# Patient Record
Sex: Female | Born: 1975
Health system: Southern US, Community
[De-identification: ages and names within clinical notes are randomized; demographics above are authoritative.]

## PROBLEM LIST (undated history)

## (undated) DIAGNOSIS — F419 Anxiety disorder, unspecified: Secondary | ICD-10-CM

## (undated) DIAGNOSIS — R51 Headache: Secondary | ICD-10-CM

## (undated) DIAGNOSIS — H11159 Pinguecula, unspecified eye: Secondary | ICD-10-CM

## (undated) DIAGNOSIS — E538 Deficiency of other specified B group vitamins: Secondary | ICD-10-CM

## (undated) DIAGNOSIS — E119 Type 2 diabetes mellitus without complications: Secondary | ICD-10-CM

## (undated) DIAGNOSIS — E785 Hyperlipidemia, unspecified: Secondary | ICD-10-CM

## (undated) DIAGNOSIS — G932 Benign intracranial hypertension: Secondary | ICD-10-CM

## (undated) DIAGNOSIS — R7301 Impaired fasting glucose: Secondary | ICD-10-CM

## (undated) DIAGNOSIS — R519 Headache, unspecified: Secondary | ICD-10-CM

## (undated) DIAGNOSIS — M222X9 Patellofemoral disorders, unspecified knee: Secondary | ICD-10-CM

## (undated) DIAGNOSIS — G43909 Migraine, unspecified, not intractable, without status migrainosus: Secondary | ICD-10-CM

## (undated) DIAGNOSIS — F329 Major depressive disorder, single episode, unspecified: Secondary | ICD-10-CM

## (undated) DIAGNOSIS — D239 Other benign neoplasm of skin, unspecified: Secondary | ICD-10-CM

## (undated) DIAGNOSIS — E786 Lipoprotein deficiency: Secondary | ICD-10-CM

## (undated) DIAGNOSIS — E041 Nontoxic single thyroid nodule: Secondary | ICD-10-CM

## (undated) DIAGNOSIS — M549 Dorsalgia, unspecified: Secondary | ICD-10-CM

## (undated) DIAGNOSIS — K5900 Constipation, unspecified: Secondary | ICD-10-CM

## (undated) DIAGNOSIS — E559 Vitamin D deficiency, unspecified: Secondary | ICD-10-CM

## (undated) DIAGNOSIS — E663 Overweight: Secondary | ICD-10-CM

## (undated) DIAGNOSIS — F32A Depression, unspecified: Secondary | ICD-10-CM

## (undated) DIAGNOSIS — T7840XA Allergy, unspecified, initial encounter: Secondary | ICD-10-CM

## (undated) DIAGNOSIS — R7303 Prediabetes: Secondary | ICD-10-CM

## (undated) DIAGNOSIS — I729 Aneurysm of unspecified site: Secondary | ICD-10-CM

## (undated) DIAGNOSIS — E669 Obesity, unspecified: Secondary | ICD-10-CM

## (undated) DIAGNOSIS — K219 Gastro-esophageal reflux disease without esophagitis: Secondary | ICD-10-CM

## (undated) HISTORY — DX: Constipation, unspecified: K59.00

## (undated) HISTORY — DX: Impaired fasting glucose: R73.01

## (undated) HISTORY — DX: Lipoprotein deficiency: E78.6

## (undated) HISTORY — DX: Migraine, unspecified, not intractable, without status migrainosus: G43.909

## (undated) HISTORY — DX: Depression, unspecified: F32.A

## (undated) HISTORY — DX: Headache, unspecified: R51.9

## (undated) HISTORY — PX: GANGLION CYST EXCISION: SHX1691

## (undated) HISTORY — DX: Headache: R51

## (undated) HISTORY — DX: Pinguecula, unspecified eye: H11.159

## (undated) HISTORY — DX: Prediabetes: R73.03

## (undated) HISTORY — DX: Aneurysm of unspecified site: I72.9

## (undated) HISTORY — DX: Hyperlipidemia, unspecified: E78.5

## (undated) HISTORY — PX: CARPAL TUNNEL RELEASE: SHX101

## (undated) HISTORY — DX: Nontoxic single thyroid nodule: E04.1

## (undated) HISTORY — DX: Other benign neoplasm of skin, unspecified: D23.9

## (undated) HISTORY — DX: Allergy, unspecified, initial encounter: T78.40XA

## (undated) HISTORY — DX: Overweight: E66.3

## (undated) HISTORY — PX: TUBAL LIGATION: SHX77

## (undated) HISTORY — DX: Patellofemoral disorders, unspecified knee: M22.2X9

## (undated) HISTORY — DX: Dorsalgia, unspecified: M54.9

## (undated) HISTORY — DX: Deficiency of other specified B group vitamins: E53.8

## (undated) HISTORY — DX: Obesity, unspecified: E66.9

## (undated) HISTORY — DX: Major depressive disorder, single episode, unspecified: F32.9

## (undated) HISTORY — DX: Vitamin D deficiency, unspecified: E55.9

## (undated) HISTORY — DX: Benign intracranial hypertension: G93.2

---

## 2005-08-17 ENCOUNTER — Ambulatory Visit: Payer: Self-pay | Admitting: Family Medicine

## 2006-02-02 ENCOUNTER — Inpatient Hospital Stay: Payer: Self-pay | Admitting: Obstetrics and Gynecology

## 2007-02-21 ENCOUNTER — Encounter: Payer: Self-pay | Admitting: Family Medicine

## 2007-03-21 ENCOUNTER — Encounter: Payer: Self-pay | Admitting: Family Medicine

## 2008-01-16 ENCOUNTER — Ambulatory Visit: Payer: Self-pay | Admitting: Specialist

## 2008-01-23 ENCOUNTER — Ambulatory Visit: Payer: Self-pay | Admitting: Specialist

## 2008-12-04 ENCOUNTER — Ambulatory Visit: Payer: Self-pay

## 2009-05-28 ENCOUNTER — Ambulatory Visit: Payer: Self-pay | Admitting: Otolaryngology

## 2010-10-06 ENCOUNTER — Ambulatory Visit: Payer: Self-pay | Admitting: Family Medicine

## 2010-11-09 ENCOUNTER — Ambulatory Visit: Payer: Self-pay | Admitting: Emergency Medicine

## 2010-11-16 ENCOUNTER — Ambulatory Visit: Payer: Self-pay | Admitting: Gastroenterology

## 2013-03-28 ENCOUNTER — Ambulatory Visit: Payer: Self-pay

## 2013-08-12 LAB — HM PAP SMEAR: HM PAP: NEGATIVE

## 2013-08-15 ENCOUNTER — Ambulatory Visit: Payer: Self-pay | Admitting: Family Medicine

## 2014-07-30 ENCOUNTER — Ambulatory Visit: Payer: Self-pay | Admitting: Family Medicine

## 2014-08-13 ENCOUNTER — Ambulatory Visit: Payer: Self-pay | Admitting: Family Medicine

## 2014-08-20 ENCOUNTER — Ambulatory Visit: Payer: Self-pay | Admitting: Family Medicine

## 2014-09-08 ENCOUNTER — Ambulatory Visit: Payer: Self-pay | Admitting: Otolaryngology

## 2014-09-20 HISTORY — PX: NASAL SINUS SURGERY: SHX719

## 2014-10-30 ENCOUNTER — Ambulatory Visit: Payer: Self-pay | Admitting: Family Medicine

## 2014-11-11 DIAGNOSIS — J324 Chronic pansinusitis: Secondary | ICD-10-CM | POA: Insufficient documentation

## 2015-02-19 DIAGNOSIS — E041 Nontoxic single thyroid nodule: Secondary | ICD-10-CM

## 2015-02-19 HISTORY — DX: Nontoxic single thyroid nodule: E04.1

## 2015-03-17 ENCOUNTER — Ambulatory Visit
Admit: 2015-03-17 | Disposition: A | Discharge status: Home / Self Care | Payer: Self-pay | Attending: Family Medicine | Admitting: Family Medicine

## 2015-04-23 ENCOUNTER — Other Ambulatory Visit: Payer: Self-pay | Admitting: Family Medicine

## 2015-04-30 ENCOUNTER — Other Ambulatory Visit: Payer: Self-pay | Admitting: Family Medicine

## 2015-04-30 NOTE — Telephone Encounter (Signed)
Pt called states she would like a medication to help her lose weight. Pt stated Dr. Sanda Klein told her to call back for medication if she was unable to lose weight within a month. Please call pt @ [phone]. Pharm is [facility]. Thanks.

## 2015-04-30 NOTE — Telephone Encounter (Signed)
I reviewed the last note; we actually were going to have her return for this as an appt

## 2015-05-07 DIAGNOSIS — R51 Headache: Secondary | ICD-10-CM

## 2015-05-07 DIAGNOSIS — R7301 Impaired fasting glucose: Secondary | ICD-10-CM | POA: Insufficient documentation

## 2015-05-07 DIAGNOSIS — E785 Hyperlipidemia, unspecified: Secondary | ICD-10-CM | POA: Insufficient documentation

## 2015-05-07 DIAGNOSIS — E663 Overweight: Secondary | ICD-10-CM | POA: Insufficient documentation

## 2015-05-07 DIAGNOSIS — T7840XA Allergy, unspecified, initial encounter: Secondary | ICD-10-CM | POA: Insufficient documentation

## 2015-05-07 DIAGNOSIS — F329 Major depressive disorder, single episode, unspecified: Secondary | ICD-10-CM | POA: Insufficient documentation

## 2015-05-07 DIAGNOSIS — E559 Vitamin D deficiency, unspecified: Secondary | ICD-10-CM | POA: Insufficient documentation

## 2015-05-07 DIAGNOSIS — E786 Lipoprotein deficiency: Secondary | ICD-10-CM | POA: Insufficient documentation

## 2015-05-07 DIAGNOSIS — M222X9 Patellofemoral disorders, unspecified knee: Secondary | ICD-10-CM | POA: Insufficient documentation

## 2015-05-07 DIAGNOSIS — D239 Other benign neoplasm of skin, unspecified: Secondary | ICD-10-CM | POA: Insufficient documentation

## 2015-05-07 DIAGNOSIS — H11159 Pinguecula, unspecified eye: Secondary | ICD-10-CM | POA: Insufficient documentation

## 2015-05-07 DIAGNOSIS — R519 Headache, unspecified: Secondary | ICD-10-CM | POA: Insufficient documentation

## 2015-05-07 DIAGNOSIS — F32A Depression, unspecified: Secondary | ICD-10-CM | POA: Insufficient documentation

## 2015-05-11 NOTE — Telephone Encounter (Signed)
Routed to provider

## 2015-05-11 NOTE — Telephone Encounter (Signed)
EFAX came through for refill RX: OMEPRAZOLE RX copy in basket

## 2015-05-12 ENCOUNTER — Ambulatory Visit: Payer: Self-pay | Admitting: Family Medicine

## 2015-05-12 NOTE — Telephone Encounter (Signed)
Pt has an appt scheduled for this Friday. Thanks.

## 2015-05-12 NOTE — Telephone Encounter (Signed)
This note is for an appointment, not a refill request; please schedule her for an appointment

## 2015-05-14 ENCOUNTER — Encounter: Payer: Self-pay | Admitting: Family Medicine

## 2015-05-14 ENCOUNTER — Ambulatory Visit (INDEPENDENT_AMBULATORY_CARE_PROVIDER_SITE_OTHER): Payer: 59 | Admitting: Family Medicine

## 2015-05-14 VITALS — BP 121/78 | HR 62 | Temp 99.4°F | Ht 60.0 in | Wt 158.0 lb

## 2015-05-14 DIAGNOSIS — E786 Lipoprotein deficiency: Secondary | ICD-10-CM | POA: Diagnosis not present

## 2015-05-14 DIAGNOSIS — E559 Vitamin D deficiency, unspecified: Secondary | ICD-10-CM

## 2015-05-14 DIAGNOSIS — E669 Obesity, unspecified: Secondary | ICD-10-CM | POA: Diagnosis not present

## 2015-05-14 MED ORDER — TOPIRAMATE 25 MG PO TABS: ORAL_TABLET | ORAL | Status: DC

## 2015-05-14 NOTE — Assessment & Plan Note (Signed)
Check level at f/u appt in 4 weeks; continue supplement

## 2015-05-14 NOTE — Patient Instructions (Addendum)
Return in 4 weeks to check labs (come fasting) Start the new medicine, once a day for one week and then two a day Call with any issues before then

## 2015-05-14 NOTE — Assessment & Plan Note (Signed)
Check lipid panel at f/u in 4 weeks; come fasting

## 2015-05-14 NOTE — Progress Notes (Signed)
BP 121/78 mmHg  Pulse 62  Temp(Src) 99.4 F (37.4 C)  Ht 5' (1.524 m)  Wt 158 lb (71.668 kg)  BMI 30.86 kg/m2  SpO2 99%  LMP  (LMP Unknown)   Subjective:    Patient ID: Ashley Wiley, female    DOB: 1976-05-30, 39 y.o.   MRN: [id number]  HPI: Ashley Wiley is a 39 y.o. female  Chief Complaint  Patient presents with  . Obesity   She tried eating very very healthy; so many veggies; drinking plenty of water, all day long She has cut back on her activity since starting [employer], lots of stress at work Legs have been hurting, not sure why; she can sit if she wants to at work, or stands; nothing different; not sure if carrying too much weight; burning pain in both legs; no swelling; she stopped going to the gym So tired; vit D deficiency in January; taking vitamin D daily, and even added B12 2,000 mcg daily a few weeks ago Sleeping okay, through the night, even naps after work Stress level is manageable Thyroid was okay in January 2016; no known thyroid dz in family She snores but doesn't think she has sleep apnea; had a sleep test a few years ago and was okay No one in family has had MEN Tubes are tied  Relevant past medical, surgical, family and social history reviewed and updated as indicated. Interim medical history since our last visit reviewed. Allergies and medications reviewed and updated.  Review of Systems  Constitutional: Positive for fatigue.  Skin:       No stretch marks, no acne  Hematological: Does not bruise/bleed easily.  Psychiatric/Behavioral: Negative for sleep disturbance.   Per HPI unless specifically indicated above     Objective:    BP 121/78 mmHg  Pulse 62  Temp(Src) 99.4 F (37.4 C)  Ht 5' (1.524 m)  Wt 158 lb (71.668 kg)  BMI 30.86 kg/m2  SpO2 99%  LMP  (LMP Unknown)  Wt Readings from Last 3 Encounters:  05/14/15 158 lb (71.668 kg)  03/23/15 157 lb (71.215 kg)    Physical Exam  Constitutional: She appears well-developed and  well-nourished. No distress.  obese  Cardiovascular: Normal rate.   Pulmonary/Chest: Effort normal.  Musculoskeletal: She exhibits no edema or tenderness.  Skin: Skin is warm and dry. No rash noted. She is not diaphoretic. No pallor.  Psychiatric: She has a normal mood and affect. Her behavior is normal. Judgment and thought content normal.      Assessment & Plan:   Problem List Items Addressed This Visit      Other   Low HDL (under 40)    Check lipid panel at f/u in 4 weeks; come fasting      Vitamin D deficiency disease    Check level at f/u appt in 4 weeks; continue supplement      Obesity - Primary    Talked with patient about treatment options; she has failed conservative weight loss efforts with just diet and activity; BMI is over 30; discussed options for medical weight loss therapy; we opted for topiramate; she is s/p BTL, discussed risk of birth defects if woman gets pregnant on this; return in 4 weeks, check LFTs at that visit too; call if any changes in mood      Relevant Medications   topiramate (TOPAMAX) 25 MG tablet       Follow up plan: Return in about 4 weeks (around 06/11/2015).

## 2015-05-14 NOTE — Assessment & Plan Note (Signed)
Talked with patient about treatment options; she has failed conservative weight loss efforts with just diet and activity; BMI is over 30; discussed options for medical weight loss therapy; we opted for topiramate; she is s/p BTL, discussed risk of birth defects if woman gets pregnant on this; return in 4 weeks, check LFTs at that visit too; call if any changes in mood

## 2015-06-10 ENCOUNTER — Ambulatory Visit: Payer: 59 | Admitting: Family Medicine

## 2015-07-13 ENCOUNTER — Encounter: Payer: Self-pay | Admitting: Family Medicine

## 2015-07-13 ENCOUNTER — Ambulatory Visit (INDEPENDENT_AMBULATORY_CARE_PROVIDER_SITE_OTHER): Payer: 59 | Admitting: Family Medicine

## 2015-07-13 VITALS — BP 121/83 | HR 80 | Temp 98.5°F | Wt 159.0 lb

## 2015-07-13 DIAGNOSIS — E041 Nontoxic single thyroid nodule: Secondary | ICD-10-CM | POA: Diagnosis not present

## 2015-07-13 DIAGNOSIS — J301 Allergic rhinitis due to pollen: Secondary | ICD-10-CM

## 2015-07-13 DIAGNOSIS — E669 Obesity, unspecified: Secondary | ICD-10-CM | POA: Diagnosis not present

## 2015-07-13 DIAGNOSIS — E559 Vitamin D deficiency, unspecified: Secondary | ICD-10-CM

## 2015-07-13 DIAGNOSIS — E786 Lipoprotein deficiency: Secondary | ICD-10-CM | POA: Diagnosis not present

## 2015-07-13 DIAGNOSIS — E785 Hyperlipidemia, unspecified: Secondary | ICD-10-CM

## 2015-07-13 DIAGNOSIS — J309 Allergic rhinitis, unspecified: Secondary | ICD-10-CM | POA: Insufficient documentation

## 2015-07-13 DIAGNOSIS — R7301 Impaired fasting glucose: Secondary | ICD-10-CM | POA: Diagnosis not present

## 2015-07-13 NOTE — Assessment & Plan Note (Signed)
Check TSH; reeviewed the [place] from April, benign-appearing sub-cm nodule on the left, nonpalpable today

## 2015-07-13 NOTE — Assessment & Plan Note (Signed)
Check cholesterol today; positive family hx; limit saturated fats and total fats

## 2015-07-13 NOTE — Patient Instructions (Addendum)
Check out the information at familydoctor.org entitled "What It Takes to Lose Weight" Try to lose between 1-2 pounds per week by taking in fewer calories and burning off more calories You can succeed by limiting portions, limiting foods dense in calories and fat, becoming more active, and drinking 8 glasses of water a day Don't skip meals, especially breakfast, as skipping meals may alter your metabolism Do not use over-the-counter weight loss pills or gimmicks that claim rapid weight loss A healthy BMI (or body mass index) is between 18.5 and 24.9 You can calculate your ideal BMI at the [place] website ClubMonetize.fr Commit to an exercise program If the swallowing/sticking gets worse, then let me know Next thyroid scan in late April 2017

## 2015-07-13 NOTE — Assessment & Plan Note (Signed)
Continue current meds 

## 2015-07-13 NOTE — Assessment & Plan Note (Signed)
Check today, patient is fasting

## 2015-07-13 NOTE — Assessment & Plan Note (Signed)
Check A1c today, work on weight loss, avoid sweets and white rice, white flour, white pastas

## 2015-07-13 NOTE — Progress Notes (Signed)
BP 121/83 mmHg  Pulse 80  Temp(Src) 98.5 F (36.9 C)  Wt 159 lb (72.122 kg)  SpO2 98%  LMP 07/02/2015 (Exact Date)   Subjective:    Patient ID: Ashley Wiley, female    DOB: 09/12/1976, 39 y.o.   MRN: [id number]  HPI: Ashley Wiley is a 39 y.o. female  Chief Complaint  Patient presents with  . Obesity  . OTHER    IFG   High cholesterol, low HDL; chol probs run in family; trying to eat better  Vitamin D supplementation daily, 2,000 iu daily  Obesity; was taking topiramate for weight loss; does not think it has helped at all; no change in appetite; she did not notice any difference and went on vacation and just stopped it; right now, she is just watching what she is eating, smaller portions; she does not have any idea how many calories she is getting a day, and can't use myfitnesspal because homemade foods; since coming back from vacation, she isn't exercising; no excuses; she just hasn't really been trying; she wants to stop at 160 pounds, she can't be over 160 pounds she says; had granola bar, peach, strip of steak and refried beans (olive oil), 3 small tortillas, half of an avocado; she has tried work out videos at home, but can't do it at home right now with in-laws at home in the morning; mother-in-law is cooking, trying to eat small amounts; thinks she is going to have join a gym  Prediabetes; checking labs today; no dry mouth; no blurry vision; getting up at night to urinate twice or so  Relevant past medical, surgical, family and social history reviewed and updated as indicated. Interim medical history since our last visit reviewed. Allergies and medications reviewed and updated.  Review of Systems  Constitutional: Negative for unexpected weight change.  HENT: Positive for trouble swallowing.    Per HPI unless specifically indicated above     Objective:    BP 121/83 mmHg  Pulse 80  Temp(Src) 98.5 F (36.9 C)  Wt 159 lb (72.122 kg)  SpO2 98%  LMP  07/02/2015 (Exact Date)  Wt Readings from Last 3 Encounters:  07/13/15 159 lb (72.122 kg)  05/14/15 158 lb (71.668 kg)  03/23/15 157 lb (71.215 kg)    Physical Exam  Constitutional: She appears well-developed and well-nourished. No distress.  HENT:  Head: Normocephalic and atraumatic.  Eyes: EOM are normal. No scleral icterus.  Neck: No thyromegaly present.  Cardiovascular: Normal rate, regular rhythm and normal heart sounds.   No murmur heard. Pulmonary/Chest: Effort normal and breath sounds normal. No respiratory distress. She has no wheezes.  Abdominal: She exhibits no distension.  Musculoskeletal: Normal range of motion. She exhibits no edema.  Neurological: She is alert. She displays no tremor. She exhibits normal muscle tone.  Reflex Scores:      Patellar reflexes are 1+ on the right side and 1+ on the left side. Skin: Skin is warm and dry. She is not diaphoretic. No pallor.  Psychiatric: She has a normal mood and affect. Her behavior is normal. Judgment and thought content normal.      Assessment & Plan:   Problem List Items Addressed This Visit      Respiratory   Allergic rhinitis    Continue current meds        Endocrine   IFG (impaired fasting glucose)    Check A1c today, work on weight loss, avoid sweets and white rice, white flour, white  pastas      Relevant Orders   Hgb A1c w/o eAG   Comprehensive metabolic panel   Thyroid nodule    Check TSH; reeviewed the [place] from April, benign-appearing sub-cm nodule on the left, nonpalpable today      Relevant Orders   TSH     Other   Hyperlipidemia    Check cholesterol today; positive family hx; limit saturated fats and total fats      Low HDL (under 40)    Check today, patient is fasting      Relevant Orders   Lipid Panel w/o Chol/HDL Ratio   Vitamin D deficiency disease - Primary    Check level today and supplement if needed      Relevant Orders   Vit D  25 hydroxy (rtn osteoporosis monitoring)    Obesity    She is going to buckle down and really work on weight loss         Follow up plan: Return in about 12 weeks (around 10/05/2015).  An after-visit summary was printed and given to the patient at [facility].  Please see the patient instructions which may contain other information and recommendations beyond what is mentioned above in the assessment and plan.  Orders Placed This Encounter  Procedures  . Hgb A1c w/o eAG  . Lipid Panel w/o Chol/HDL Ratio  . Vit D  25 hydroxy (rtn osteoporosis monitoring)  . Comprehensive metabolic panel  . TSH

## 2015-07-13 NOTE — Assessment & Plan Note (Signed)
She is going to buckle down and really work on weight loss

## 2015-07-13 NOTE — Assessment & Plan Note (Signed)
Check level today and supplement if needed 

## 2015-07-14 LAB — COMPREHENSIVE METABOLIC PANEL
ALBUMIN: 4.3 g/dL (ref 3.5–5.5)
ALT: 56 IU/L — AB (ref 0–32)
AST: 39 IU/L (ref 0–40)
Albumin/Globulin Ratio: 2.4 (ref 1.1–2.5)
Alkaline Phosphatase: 78 IU/L (ref 39–117)
BILIRUBIN TOTAL: 0.7 mg/dL (ref 0.0–1.2)
BUN / CREAT RATIO: 16 (ref 8–20)
BUN: 9 mg/dL (ref 6–20)
CHLORIDE: 96 mmol/L — AB (ref 97–108)
CO2: 24 mmol/L (ref 18–29)
Calcium: 9.1 mg/dL (ref 8.7–10.2)
Creatinine, Ser: 0.56 mg/dL — ABNORMAL LOW (ref 0.57–1.00)
GFR calc Af Amer: 138 mL/min/{1.73_m2} (ref >59)
GFR calc non Af Amer: 119 mL/min/{1.73_m2} (ref >59)
Globulin, Total: 1.8 g/dL (ref 1.5–4.5)
Glucose: 99 mg/dL (ref 65–99)
Potassium: 4.5 mmol/L (ref 3.5–5.2)
Sodium: 138 mmol/L (ref 134–144)
Total Protein: 6.1 g/dL (ref 6.0–8.5)

## 2015-07-14 LAB — HGB A1C W/O EAG: Hgb A1c MFr Bld: 6 % — ABNORMAL HIGH (ref 4.8–5.6)

## 2015-07-14 LAB — LIPID PANEL W/O CHOL/HDL RATIO
Cholesterol, Total: 187 mg/dL (ref 100–199)
HDL: 29 mg/dL — ABNORMAL LOW (ref >39)
LDL Calculated: 105 mg/dL — ABNORMAL HIGH (ref 0–99)
Triglycerides: 263 mg/dL — ABNORMAL HIGH (ref 0–149)
VLDL Cholesterol Cal: 53 mg/dL — ABNORMAL HIGH (ref 5–40)

## 2015-07-14 LAB — VITAMIN D 25 HYDROXY (VIT D DEFICIENCY, FRACTURES): Vit D, 25-Hydroxy: 22.4 ng/mL — ABNORMAL LOW (ref 30.0–100.0)

## 2015-07-14 LAB — TSH: TSH: 1.71 u[IU]/mL (ref 0.450–4.500)

## 2015-07-23 ENCOUNTER — Encounter: Payer: Self-pay | Admitting: Family Medicine

## 2015-07-23 ENCOUNTER — Telehealth: Payer: Self-pay | Admitting: Family Medicine

## 2015-07-23 NOTE — Telephone Encounter (Signed)
Routing to provider. I sent you a phone call message on her as well.

## 2015-07-23 NOTE — Telephone Encounter (Signed)
Pt would like to know her lab results, did not want to wait for letter.

## 2015-07-23 NOTE — Telephone Encounter (Signed)
Dr. Sanda Klein, I don't seen a message or a letter about her lab results.

## 2015-07-27 ENCOUNTER — Encounter: Payer: Self-pay | Admitting: Family Medicine

## 2015-07-28 NOTE — Telephone Encounter (Signed)
I left message.

## 2015-08-02 MED ORDER — VITAMIN D3 125 MCG (5000 UT) PO CAPS: 1.0000 | ORAL_CAPSULE | Freq: Every day | ORAL | Status: DC

## 2015-08-02 MED ORDER — METFORMIN HCL 500 MG PO TABS: 500.0000 mg | ORAL_TABLET | Freq: Every day | ORAL | Status: DC

## 2015-08-02 NOTE — Addendum Note (Signed)
Addended by: Dicie Edelen, Satira Anis on: 08/02/2015 12:56 PM   Modules accepted: Orders, Medications

## 2015-08-02 NOTE — Telephone Encounter (Addendum)
Taking 5,000 iu vit D3 now since seeing vit D was low; we'll do the 5,000 iu daily for about 2-3 months, then recheck No interest in nutritionist Let's start metformin to help with weight loss and blood sugar control; Rx for short-acting sent to pharmacy; she had GI upset with long-acting last year Weight loss, healthy eating, activity are key

## 2015-08-02 NOTE — Telephone Encounter (Signed)
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

## 2015-10-05 ENCOUNTER — Encounter: Payer: Self-pay | Admitting: Family Medicine

## 2015-10-05 ENCOUNTER — Ambulatory Visit (INDEPENDENT_AMBULATORY_CARE_PROVIDER_SITE_OTHER): Payer: 59 | Admitting: Family Medicine

## 2015-10-05 VITALS — BP 122/79 | HR 68 | Temp 97.4°F | Ht 59.5 in | Wt 158.0 lb

## 2015-10-05 DIAGNOSIS — E781 Pure hyperglyceridemia: Secondary | ICD-10-CM | POA: Insufficient documentation

## 2015-10-05 DIAGNOSIS — E669 Obesity, unspecified: Secondary | ICD-10-CM

## 2015-10-05 DIAGNOSIS — E041 Nontoxic single thyroid nodule: Secondary | ICD-10-CM

## 2015-10-05 DIAGNOSIS — R740 Nonspecific elevation of levels of transaminase and lactic acid dehydrogenase [LDH]: Secondary | ICD-10-CM | POA: Diagnosis not present

## 2015-10-05 DIAGNOSIS — R7401 Elevation of levels of liver transaminase levels: Secondary | ICD-10-CM | POA: Insufficient documentation

## 2015-10-05 DIAGNOSIS — R7301 Impaired fasting glucose: Secondary | ICD-10-CM | POA: Diagnosis not present

## 2015-10-05 DIAGNOSIS — E786 Lipoprotein deficiency: Secondary | ICD-10-CM

## 2015-10-05 NOTE — Assessment & Plan Note (Addendum)
Check A1C 6 months after last (will be on or after Jan 13, 2016); work on healthy eating, weight loss, exercise; she declined offer for diabetic education; check out ADA or [email or web address] info

## 2015-10-05 NOTE — Assessment & Plan Note (Signed)
Continue to work on weight loss, healthy eating, activity

## 2015-10-05 NOTE — Assessment & Plan Note (Signed)
Benign-appearing nodule on [place], report reviewed from April; asymptomatic

## 2015-10-05 NOTE — Progress Notes (Signed)
BP 122/79 mmHg  Pulse 68  Temp(Src) 97.4 F (36.3 C)  Ht 4' 11.5" (1.511 m)  Wt 158 lb (71.668 kg)  BMI 31.39 kg/m2  SpO2 100%  LMP 09/14/2015 (Exact Date)   Subjective:    Patient ID: Ashley Wiley, female    DOB: Dec 04, 1975, 39 y.o.   MRN: [id number]  HPI: Ashley Wiley is a 39 y.o. female  Chief Complaint  Patient presents with  . Hyperlipidemia  . IFG  . Obesity   Obesity Following up on her weight; she is trying to be more active, cutting down on portions; she has not lost any weight; energy level is pretty good; active 150 minutes per week  Impaired fasting glucose Last A1C was 6.0 (August 2016); diabetes runs in the family; father's side is almost all of them; she does not have time for diabetic education when I offered it; not a fan of sweets; she has a problem with potatoes; now eating salad instead of fries  Low HDL, high TG; since last visit, she has increased activity; more veggies; does not fry a lot of food; no questions about cooking oils, how to cook healthier; she is not interested in a vegetarian or vegan diet  Last ALT was 56 (August 2016)  Thyroid nodule, benign-appearing nodule in April 2016; no sticking with swallowing  Flu shot UTD  Relevant past medical, surgical, family and social history reviewed and updated as indicated. Interim medical history since our last visit reviewed. Allergies and medications reviewed and updated.  Review of Systems Per HPI unless specifically indicated above     Objective:    BP 122/79 mmHg  Pulse 68  Temp(Src) 97.4 F (36.3 C)  Ht 4' 11.5" (1.511 m)  Wt 158 lb (71.668 kg)  BMI 31.39 kg/m2  SpO2 100%  LMP 09/14/2015 (Exact Date)  Wt Readings from Last 3 Encounters:  10/05/15 158 lb (71.668 kg)  07/13/15 159 lb (72.122 kg)  05/14/15 158 lb (71.668 kg)    Physical Exam  Constitutional: She appears well-developed and well-nourished. No distress.  Eyes: No scleral icterus.  Neck: No thyromegaly  present.  Cardiovascular: Normal rate and regular rhythm.   Pulmonary/Chest: Effort normal and breath sounds normal.  Skin: No rash noted.  Psychiatric: She has a normal mood and affect. Her behavior is normal. Judgment and thought content normal.    Results for orders placed or performed in visit on 07/13/15  Hgb A1c w/o eAG  Result Value Ref Range   Hgb A1c MFr Bld 6.0 (H) 4.8 - 5.6 %  Lipid Panel w/o Chol/HDL Ratio  Result Value Ref Range   Cholesterol, Total 187 100 - 199 mg/dL   Triglycerides 263 (H) 0 - 149 mg/dL   HDL 29 (L) >39 mg/dL   VLDL Cholesterol Cal 53 (H) 5 - 40 mg/dL   LDL Calculated 105 (H) 0 - 99 mg/dL  Vit D  25 hydroxy (rtn osteoporosis monitoring)  Result Value Ref Range   Vit D, 25-Hydroxy 22.4 (L) 30.0 - 100.0 ng/mL  Comprehensive metabolic panel  Result Value Ref Range   Glucose 99 65 - 99 mg/dL   BUN 9 6 - 20 mg/dL   Creatinine, Ser 0.56 (L) 0.57 - 1.00 mg/dL   GFR calc non Af Amer 119 >59 mL/min/1.73   GFR calc Af Amer 138 >59 mL/min/1.73   BUN/Creatinine Ratio 16 8 - 20   Sodium 138 134 - 144 mmol/L   Potassium 4.5 3.5 - 5.2  mmol/L   Chloride 96 (L) 97 - 108 mmol/L   CO2 24 18 - 29 mmol/L   Calcium 9.1 8.7 - 10.2 mg/dL   Total Protein 6.1 6.0 - 8.5 g/dL   Albumin 4.3 3.5 - 5.5 g/dL   Globulin, Total 1.8 1.5 - 4.5 g/dL   Albumin/Globulin Ratio 2.4 1.1 - 2.5   Bilirubin Total 0.7 0.0 - 1.2 mg/dL   Alkaline Phosphatase 78 39 - 117 IU/L   AST 39 0 - 40 IU/L   ALT 56 (H) 0 - 32 IU/L  TSH  Result Value Ref Range   TSH 1.710 0.450 - 4.500 uIU/mL      Assessment & Plan:   Problem List Items Addressed This Visit      Endocrine   IFG (impaired fasting glucose) - Primary    Check A1C 6 months after last (will be on or after Jan 13, 2016); work on healthy eating, weight loss, exercise; she declined offer for diabetic education; check out ADA or [email or web address] info      Thyroid nodule    Benign-appearing nodule on [place], report reviewed from  April; asymptomatic        Other   Low HDL (under 40)    Weight loss, increased activity; check lipids      Obesity    Continue to work on weight loss, healthy eating, activity      Elevated ALT measurement    Mildly elevated; likely component of fatty liver; I reviewed abd US done in Sept 2014, no liver masses; recheck liver enzymes in February with other labs; continue to try to eat healthy, stay active, lose weight      Hypertriglyceridemia    Last TG level 263; she will continue to work on weight loss, activity, try to cut back on potatoes; check fasting lipid panel in Feb         Follow up plan: Return in about 3 months (around 01/13/2016), or or later, for for visit and fasting labs.

## 2015-10-05 NOTE — Assessment & Plan Note (Signed)
Weight loss, increased activity; check lipids

## 2015-10-05 NOTE — Assessment & Plan Note (Addendum)
Mildly elevated; likely component of fatty liver; I reviewed abd US done in Sept 2014, no liver masses; recheck liver enzymes in February with other labs; continue to try to eat healthy, stay active, lose weight

## 2015-10-05 NOTE — Patient Instructions (Addendum)
Please check out information on the internet  [email or web address]  Keep up the good effort at healthier eating and weight loss  Check out the information at familydoctor.org entitled "What It Takes to Lose Weight" Try to lose between 1-2 pounds per week by taking in fewer calories and burning off more calories You can succeed by limiting portions, limiting foods dense in calories and fat, becoming more active, and drinking 8 glasses of water a day (64 ounces) Don't skip meals, especially breakfast, as skipping meals may alter your metabolism Do not use over-the-counter weight loss pills or gimmicks that claim rapid weight loss A healthy BMI (or body mass index) is between 18.5 and 24.9 You can calculate your ideal BMI at the [place] website ClubMonet[employer].fr  Return on or after January 13, 2016 and please come fasting for labs and visit

## 2015-10-05 NOTE — Assessment & Plan Note (Signed)
Last TG level 263; she will continue to work on weight loss, activity, try to cut back on potatoes; check fasting lipid panel in Feb

## 2015-11-26 ENCOUNTER — Other Ambulatory Visit: Payer: Self-pay | Admitting: Family Medicine

## 2015-11-26 NOTE — Telephone Encounter (Signed)
Please schedule

## 2015-11-26 NOTE — Telephone Encounter (Signed)
Fasting labs and visit due on or just after January 13, 2016; please call patient and schedule appt, 30 minutes; thanks I'll send in refills as requested

## 2015-12-31 NOTE — Telephone Encounter (Signed)
Pt scheduled for a CPE 01/13/16 @ 8am. Thanks.

## 2016-01-13 ENCOUNTER — Encounter: Payer: Self-pay | Admitting: Family Medicine

## 2016-01-13 ENCOUNTER — Ambulatory Visit: Payer: 59 | Admitting: Family Medicine

## 2016-01-13 ENCOUNTER — Ambulatory Visit (INDEPENDENT_AMBULATORY_CARE_PROVIDER_SITE_OTHER): Payer: 59 | Admitting: Family Medicine

## 2016-01-13 VITALS — BP 108/75 | HR 71 | Temp 98.6°F | Ht 59.6 in | Wt 161.0 lb

## 2016-01-13 DIAGNOSIS — Z8619 Personal history of other infectious and parasitic diseases: Secondary | ICD-10-CM | POA: Diagnosis not present

## 2016-01-13 DIAGNOSIS — E669 Obesity, unspecified: Secondary | ICD-10-CM

## 2016-01-13 DIAGNOSIS — Z0000 Encounter for general adult medical examination without abnormal findings: Secondary | ICD-10-CM

## 2016-01-13 DIAGNOSIS — F32A Depression, unspecified: Secondary | ICD-10-CM

## 2016-01-13 DIAGNOSIS — R7301 Impaired fasting glucose: Secondary | ICD-10-CM

## 2016-01-13 DIAGNOSIS — E559 Vitamin D deficiency, unspecified: Secondary | ICD-10-CM | POA: Diagnosis not present

## 2016-01-13 DIAGNOSIS — E785 Hyperlipidemia, unspecified: Secondary | ICD-10-CM | POA: Diagnosis not present

## 2016-01-13 DIAGNOSIS — Z23 Encounter for immunization: Secondary | ICD-10-CM | POA: Diagnosis not present

## 2016-01-13 DIAGNOSIS — F329 Major depressive disorder, single episode, unspecified: Secondary | ICD-10-CM

## 2016-01-13 DIAGNOSIS — E041 Nontoxic single thyroid nodule: Secondary | ICD-10-CM | POA: Diagnosis not present

## 2016-01-13 DIAGNOSIS — F419 Anxiety disorder, unspecified: Secondary | ICD-10-CM | POA: Diagnosis not present

## 2016-01-13 DIAGNOSIS — Z124 Encounter for screening for malignant neoplasm of cervix: Secondary | ICD-10-CM

## 2016-01-13 MED ORDER — METFORMIN HCL ER 500 MG PO TB24: 1000.0000 mg | ORAL_TABLET | Freq: Every evening | ORAL | Status: DC

## 2016-01-13 MED ORDER — FLUOXETINE HCL 20 MG PO TABS
20.0000 mg | ORAL_TABLET | Freq: Every day | ORAL | Status: DC
Start: 2016-01-13 — End: 2016-02-15

## 2016-01-13 NOTE — Assessment & Plan Note (Signed)
USPSTF grade A and B recommendations reviewed with patient; age-appropriate recommendations, preventive care, screening tests, etc discussed and encouraged; healthy living encouraged; see AVS for patient education given to patient  

## 2016-01-13 NOTE — Assessment & Plan Note (Signed)
Encouraged healthy eating, exercise, adequate water intake; see AVS; refer to nutritionist; may return for discussion of weight loss medicine

## 2016-01-13 NOTE — Assessment & Plan Note (Signed)
Check labs 

## 2016-01-13 NOTE — Assessment & Plan Note (Signed)
Thin prep collected today 

## 2016-01-13 NOTE — Assessment & Plan Note (Signed)
Check level and supplement additionally if needed; if normal, 1000 iu to maintain

## 2016-01-13 NOTE — Assessment & Plan Note (Addendum)
Refer to nutritionist; she has cut back on bread and rice; adjust metformin to the long-acting version and she will take 1000 mg every evening

## 2016-01-13 NOTE — Assessment & Plan Note (Signed)
Check TSH today

## 2016-01-13 NOTE — Assessment & Plan Note (Addendum)
Resolved, off of SSRI for now, but adding it back for her anxiety

## 2016-01-13 NOTE — Patient Instructions (Addendum)
Start back on vitamin D 1000 iu daily Start back on fluoxetine 20 mg daily Change to the new metformin We'll get labs today We'll refer you to the nutritionist If you have not heard anything from my staff in a week about any orders/referrals/studies from today, please contact us here to follow-up [phone]) [phone]   Health Maintenance, Female Adopting a healthy lifestyle and getting preventive care can go a long way to promote health and wellness. Talk with your health care provider about what schedule of regular examinations is right for you. This is a good chance for you to check in with your provider about disease prevention and staying healthy. In between checkups, there are plenty of things you can do on your own. Experts have done a lot of research about which lifestyle changes and preventive measures are most likely to keep you healthy. Ask your health care provider for more information. WEIGHT AND DIET  Eat a healthy diet  Be sure to include plenty of vegetables, fruits, low-fat dairy products, and lean protein.  Do not eat a lot of foods high in solid fats, added sugars, or salt.  Get regular exercise. This is one of the most important things you can do for your health.  Most adults should exercise for at least 150 minutes each week. The exercise should increase your heart rate and make you sweat (moderate-intensity exercise).  Most adults should also do strengthening exercises at least twice a week. This is in addition to the moderate-intensity exercise.  Maintain a healthy weight  Body mass index (BMI) is a measurement that can be used to identify possible weight problems. It estimates body fat based on height and weight. Your health care provider can help determine your BMI and help you achieve or maintain a healthy weight.  For females 71 years of age and older:   A BMI below 18.5 is considered underweight.  A BMI of 18.5 to 24.9 is normal.  A BMI of 25 to 29.9 is  considered overweight.  A BMI of 30 and above is considered obese.  Watch levels of cholesterol and blood lipids  You should start having your blood tested for lipids and cholesterol at 39 years of age, then have this test every 5 years.  You may need to have your cholesterol levels checked more often if:  Your lipid or cholesterol levels are high.  You are older than 39 years of age.  You are at high risk for heart disease.  CANCER SCREENING   Lung Cancer  Lung cancer screening is recommended for adults 87-11 years old who are at high risk for lung cancer because of a history of smoking.  A yearly low-dose CT scan of the lungs is recommended for people who:  Currently smoke.  Have quit within the past 15 years.  Have at least a 30-pack-year history of smoking. A pack year is smoking an average of one pack of cigarettes a day for 1 year.  Yearly screening should continue until it has been 15 years since you quit.  Yearly screening should stop if you develop a health problem that would prevent you from having lung cancer treatment.  Breast Cancer  Practice breast self-awareness. This means understanding how your breasts normally appear and feel.  It also means doing regular breast self-exams. Let your health care provider know about any changes, no matter how small.  If you are in your 20s or 30s, you should have a clinical breast exam (CBE) by  a health care provider every 1-3 years as part of a regular health exam.  If you are 8 or older, have a CBE every year. Also consider having a breast X-ray (mammogram) every year.  If you have a family history of breast cancer, talk to your health care provider about genetic screening.  If you are at high risk for breast cancer, talk to your health care provider about having an MRI and a mammogram every year.  Breast cancer gene (BRCA) assessment is recommended for women who have family members with BRCA-related cancers.  BRCA-related cancers include:  Breast.  Ovarian.  Tubal.  Peritoneal cancers.  Results of the assessment will determine the need for genetic counseling and BRCA1 and BRCA2 testing. Cervical Cancer Your health care provider may recommend that you be screened regularly for cancer of the pelvic organs (ovaries, uterus, and vagina). This screening involves a pelvic examination, including checking for microscopic changes to the surface of your cervix (Pap test). You may be encouraged to have this screening done every 3 years, beginning at age 44.  For women ages 7-65, health care providers may recommend pelvic exams and Pap testing every 3 years, or they may recommend the Pap and pelvic exam, combined with testing for human papilloma virus (HPV), every 5 years. Some types of HPV increase your risk of cervical cancer. Testing for HPV may also be done on women of any age with unclear Pap test results.  Other health care providers may not recommend any screening for nonpregnant women who are considered low risk for pelvic cancer and who do not have symptoms. Ask your health care provider if a screening pelvic exam is right for you.  If you have had past treatment for cervical cancer or a condition that could lead to cancer, you need Pap tests and screening for cancer for at least 20 years after your treatment. If Pap tests have been discontinued, your risk factors (such as having a new sexual partner) need to be reassessed to determine if screening should resume. Some women have medical problems that increase the chance of getting cervical cancer. In these cases, your health care provider may recommend more frequent screening and Pap tests. Colorectal Cancer  This type of cancer can be detected and often prevented.  Routine colorectal cancer screening usually begins at 39 years of age and continues through 39 years of age.  Your health care provider may recommend screening at an earlier age if you  have risk factors for colon cancer.  Your health care provider may also recommend using home test kits to check for hidden blood in the stool.  A small camera at the end of a tube can be used to examine your colon directly (sigmoidoscopy or colonoscopy). This is done to check for the earliest forms of colorectal cancer.  Routine screening usually begins at age 22.  Direct examination of the colon should be repeated every 5-10 years through 39 years of age. However, you may need to be screened more often if early forms of precancerous polyps or small growths are found. Skin Cancer  Check your skin from head to toe regularly.  Tell your health care provider about any new moles or changes in moles, especially if there is a change in a mole's shape or color.  Also tell your health care provider if you have a mole that is larger than the size of a pencil eraser.  Always use sunscreen. Apply sunscreen liberally and repeatedly throughout the day.  Protect yourself by wearing long sleeves, pants, a wide-brimmed hat, and sunglasses whenever you are outside. HEART DISEASE, DIABETES, AND HIGH BLOOD PRESSURE   High blood pressure causes heart disease and increases the risk of stroke. High blood pressure is more likely to develop in:  People who have blood pressure in the high end of the normal range (130-139/85-89 mm Hg).  People who are overweight or obese.  People who are African American.  If you are 29-4 years of age, have your blood pressure checked every 3-5 years. If you are 42 years of age or older, have your blood pressure checked every year. You should have your blood pressure measured twice--once when you are at a hospital or clinic, and once when you are not at a hospital or clinic. Record the average of the two measurements. To check your blood pressure when you are not at a hospital or clinic, you can use:  An automated blood pressure machine at a pharmacy.  A home blood pressure  monitor.  If you are between 27 years and 61 years old, ask your health care provider if you should take aspirin to prevent strokes.  Have regular diabetes screenings. This involves taking a blood sample to check your fasting blood sugar level.  If you are at a normal weight and have a low risk for diabetes, have this test once every three years after 39 years of age.  If you are overweight and have a high risk for diabetes, consider being tested at a younger age or more often. PREVENTING INFECTION  Hepatitis B  If you have a higher risk for hepatitis B, you should be screened for this virus. You are considered at high risk for hepatitis B if:  You were born in a country where hepatitis B is common. Ask your health care provider which countries are considered high risk.  Your parents were born in a high-risk country, and you have not been immunized against hepatitis B (hepatitis B vaccine).  You have HIV or AIDS.  You use needles to inject street drugs.  You live with someone who has hepatitis B.  You have had sex with someone who has hepatitis B.  You get hemodialysis treatment.  You take certain medicines for conditions, including cancer, organ transplantation, and autoimmune conditions. Hepatitis C  Blood testing is recommended for:  Everyone born from [birth year] through 1964.  Anyone with known risk factors for hepatitis C. Sexually transmitted infections (STIs)  You should be screened for sexually transmitted infections (STIs) including gonorrhea and chlamydia if:  You are sexually active and are younger than 39 years of age.  You are older than 39 years of age and your health care provider tells you that you are at risk for this type of infection.  Your sexual activity has changed since you were last screened and you are at an increased risk for chlamydia or gonorrhea. Ask your health care provider if you are at risk.  If you do not have HIV, but are at risk, it may be  recommended that you take a prescription medicine daily to prevent HIV infection. This is called pre-exposure prophylaxis (PrEP). You are considered at risk if:  You are sexually active and do not regularly use condoms or know the HIV status of your partner(s).  You take drugs by injection.  You are sexually active with a partner who has HIV. Talk with your health care provider about whether you are at high risk of being infected  with HIV. If you choose to begin PrEP, you should first be tested for HIV. You should then be tested every 3 months for as long as you are taking PrEP.  PREGNANCY   If you are premenopausal and you may become pregnant, ask your health care provider about preconception counseling.  If you may become pregnant, take 400 to 800 micrograms (mcg) of folic acid every day.  If you want to prevent pregnancy, talk to your health care provider about birth control (contraception). OSTEOPOROSIS AND MENOPAUSE   Osteoporosis is a disease in which the bones lose minerals and strength with aging. This can result in serious bone fractures. Your risk for osteoporosis can be identified using a bone density scan.  If you are 16 years of age or older, or if you are at risk for osteoporosis and fractures, ask your health care provider if you should be screened.  Ask your health care provider whether you should take a calcium or vitamin D supplement to lower your risk for osteoporosis.  Menopause may have certain physical symptoms and risks.  Hormone replacement therapy may reduce some of these symptoms and risks. Talk to your health care provider about whether hormone replacement therapy is right for you.  HOME CARE INSTRUCTIONS   Schedule regular health, dental, and eye exams.  Stay current with your immunizations.   Do not use any tobacco products including cigarettes, chewing tobacco, or electronic cigarettes.  If you are pregnant, do not drink alcohol.  If you are  breastfeeding, limit how much and how often you drink alcohol.  Limit alcohol intake to no more than 1 drink per day for nonpregnant women. One drink equals 12 ounces of beer, 5 ounces of wine, or 1 ounces of hard liquor.  Do not use street drugs.  Do not share needles.  Ask your health care provider for help if you need support or information about quitting drugs.  Tell your health care provider if you often feel depressed.  Tell your health care provider if you have ever been abused or do not feel safe at home.   This information is not intended to replace advice given to you by your health care provider. Make sure you discuss any questions you have with your health care provider.   Document Released: 05/22/2011 Document Revised: 11/27/2014 Document Reviewed: 10/08/2013 Elsevier Interactive Patient Education [employer].

## 2016-01-13 NOTE — Assessment & Plan Note (Signed)
Start back on low dose fluoxetine 20 mg daily; return in 3 weeks for reassessment, repeat GAD-7

## 2016-01-13 NOTE — Progress Notes (Signed)
Patient ID: Ashley Wiley, female   DOB: May 13, 1976, 40 y.o.   MRN: [id number]   Subjective:   Ashley Wiley is a 40 y.o. female here for a complete physical exam  Interim issues since last visit: blood sugars were not where she wanted them so she increased her metformin to 1000 mg on her own at bedtime; 123 fasting the last time; two weeks ago  USPSTF grade A and B recommendations Alcohol: no worries Depression:  Depression screen [facility] 2/9 01/13/2016  Decreased Interest 0  Down, Depressed, Hopeless 0  PHQ - 2 Score 0  well-controlled now  GAD 7 : Generalized Anxiety Score 01/13/2016  Nervous, Anxious, on Edge 2  Control/stop worrying 1  Worry too much - different things 3  Trouble relaxing 3  Restless 3  Easily annoyed or irritable 3  Afraid - awful might happen 2  Total GAD 7 Score 17  Anxiety Difficulty Somewhat difficult  now she has lots of anxiety Dr. Venia Minks is leaving, her provider she works with, going out of state, worried she might lose her job; bad panic attack; reassured she is going to keep her job; still having some anxiety, more with weight now too  Hypertension: controlled Obesity: works out 4 days a week Tobacco use: nonsmoker HIV, hep B, hep C: okay to test HIV, others n/a STD testing and prevention (chl/gon/syphilis):  Lipids: fasting today Glucose: fasting today Colorectal cancer: no first degree relative with colon cancer Breast cancer: no lumps BRCA gene screening: no fam hx of breast or ovarian cancer Intimate partner violence: Cervical cancer screening: today Fall prevention/vitamin D: not taking vit D Diet: no juice, no soda, drinking plenty of water Exercise: works out 4 x a week Skin cancer: no worrisome moles  Past Medical History  Diagnosis Date  . IFG (impaired fasting glucose)   . Hyperlipidemia   . Low HDL (under 40)   . Overweight   . Allergy   . Patella-femoral syndrome   . Vitamin D deficiency disease   . Dermatofibroma    . Facial pain   . Depression   . Pinguecula   . Thyroid nodule april 2016   Past Surgical History  Procedure Laterality Date  . Tubal ligation    . Ganglion cyst excision Left   . Carpal tunnel release    . Nasal sinus surgery  Nov 2015   Family History  Problem Relation Age of Onset  . Diabetes Father   . Hypertension Father   . Heart disease Father   . Kidney disease Father   . Stroke Father   . Diabetes Paternal Grandfather    Social History  Substance Use Topics  . Smoking status: Never Smoker   . Smokeless tobacco: Never Used  . Alcohol Use: Yes     Comment: on occasion   Review of Systems  Constitutional: Negative for fever and chills.  HENT: Negative for hearing loss.   Eyes: Negative for visual disturbance.  Respiratory: Negative for wheezing.   Cardiovascular: Negative for palpitations.  Gastrointestinal: Negative for blood in stool.  Endocrine: Negative for polyuria.  Genitourinary: Negative for hematuria.  Musculoskeletal: Positive for arthralgias (knees, hands, nothing new, from exercise).  Allergic/Immunologic: Negative for food allergies.  Neurological: Positive for tremors (not sure if due to her sugars).  Hematological: Does not bruise/bleed easily.  Psychiatric/Behavioral: The patient is nervous/anxious.     Objective:   Filed Vitals:   01/13/16 0806  BP: 108/75  Pulse: 71  Temp:  98.6 F (37 C)  Height: 4' 11.6" (1.514 m)  Weight: 161 lb (73.029 kg)  SpO2: 99%   Body mass index is 31.86 kg/(m^2). Wt Readings from Last 3 Encounters:  01/13/16 161 lb (73.029 kg)  10/05/15 158 lb (71.668 kg)  07/13/15 159 lb (72.122 kg)   Physical Exam  Constitutional: She appears well-developed and well-nourished.  obese  HENT:  Head: Normocephalic and atraumatic.  Eyes: Conjunctivae and EOM are normal. Right eye exhibits no hordeolum. Left eye exhibits no hordeolum. No scleral icterus.  Neck: Carotid bruit is not present. No thyromegaly present.   Cardiovascular: Normal rate, regular rhythm, S1 normal, S2 normal and normal heart sounds.   No extrasystoles are present.  Pulmonary/Chest: Effort normal and breath sounds normal. No respiratory distress. Right breast exhibits no inverted nipple, no mass, no nipple discharge, no skin change and no tenderness. Left breast exhibits no inverted nipple, no mass, no nipple discharge, no skin change and no tenderness. Breasts are symmetrical.  Abdominal: Soft. Normal appearance and bowel sounds are normal. She exhibits no distension, no abdominal bruit, no pulsatile midline mass and no mass. There is no hepatosplenomegaly. There is no tenderness. No hernia.  Genitourinary: Uterus normal. Pelvic exam was performed with patient prone. There is no rash or lesion on the right labia. There is no rash or lesion on the left labia. Cervix exhibits no motion tenderness. Right adnexum displays no mass, no tenderness and no fullness. Left adnexum displays no mass, no tenderness and no fullness.  Musculoskeletal: Normal range of motion. She exhibits no edema.  Lymphadenopathy:       Head (right side): No submandibular adenopathy present.       Head (left side): No submandibular adenopathy present.    She has no cervical adenopathy.    She has no axillary adenopathy.  Neurological: She is alert. She displays no tremor. No cranial nerve deficit. She exhibits normal muscle tone. Gait normal.  Skin: Skin is warm and dry. No bruising and no ecchymosis noted. No cyanosis. No pallor.  No nailbed pallor, no palmar erythema  Psychiatric: Her speech is normal and behavior is normal. Thought content normal. Her mood appears not anxious. She does not exhibit a depressed mood.    Assessment/Plan:   Problem List Items Addressed This Visit      Endocrine   IFG (impaired fasting glucose)    Refer to nutritionist; she has cut back on bread and rice; adjust metformin to the long-acting version and she will take 1000 mg every  evening      Relevant Orders   Hgb A1c w/o eAG   Ambulatory referral to Nutrition and Diabetic Education   Thyroid nodule    Check TSH today        Other   Hyperlipidemia    Check labs      Vitamin D deficiency disease    Check level and supplement additionally if needed; if normal, 1000 iu to maintain      Relevant Orders   VITAMIN D 25 Hydroxy (Vit-D Deficiency, Fractures)   Depression    Resolved, off of SSRI for now, but adding it back for her anxiety      Relevant Medications   FLUoxetine (PROZAC) 20 MG tablet   Obesity    Encouraged healthy eating, exercise, adequate water intake; see AVS; refer to nutritionist; may return for discussion of weight loss medicine      Relevant Medications   metFORMIN (GLUCOPHAGE XR) 500 MG 24 hr  tablet   Other Relevant Orders   Ambulatory referral to Nutrition and Diabetic Education   Acute anxiety    Start back on low dose fluoxetine 20 mg daily; return in 3 weeks for reassessment, repeat GAD-7      Relevant Medications   FLUoxetine (PROZAC) 20 MG tablet   Preventative health care - Primary    USPSTF grade A and B recommendations reviewed with patient; age-appropriate recommendations, preventive care, screening tests, etc discussed and encouraged; healthy living encouraged; see AVS for patient education given to patient      Relevant Orders   TSH   Lipid Panel w/o Chol/HDL Ratio   Comprehensive metabolic panel   CBC with Differential/Platelet   History of HPV infection    Thin prep collected today      Encounter for screening for cervical cancer     Thin prep collected today      Relevant Orders   Pap liquid-based and HPV (high risk)    Other Visit Diagnoses    Need for diphtheria-tetanus-pertussis (Tdap) vaccine, adult/adolescent        offered, given today    Relevant Orders    Tdap vaccine greater than or equal to 7yo IM (Completed)        Meds ordered this encounter  Medications  . metFORMIN (GLUCOPHAGE  XR) 500 MG 24 hr tablet    Sig: Take 2 tablets (1,000 mg total) by mouth every evening.    Dispense:  60 tablet    Refill:  33    Cancel any old metformin to avoid confusion  . FLUoxetine (PROZAC) 20 MG tablet    Sig: Take 1 tablet (20 mg total) by mouth daily.    Dispense:  30 tablet    Refill:  3   Orders Placed This Encounter  Procedures  . Tdap vaccine greater than or equal to 7yo IM  . HM PAP SMEAR    This external order was created through the Results Console.  . TSH  . Lipid Panel w/o Chol/HDL Ratio    Order Specific Question:  Has the patient fasted?    Answer:  Yes  . Comprehensive metabolic panel    Order Specific Question:  Has the patient fasted?    Answer:  Yes  . CBC with Differential/Platelet  . VITAMIN D 25 Hydroxy (Vit-D Deficiency, Fractures)  . Hgb A1c w/o eAG  . Ambulatory referral to Nutrition and Diabetic Education    Referral Priority:  Routine    Referral Type:  Consultation    Referral Reason:  Specialty Services Required    Number of Visits Requested:  1    Follow up plan: Return in about 1 year (around 01/12/2017) for complete physical, 3 weeks for weight management.  An after-visit summary was printed and given to the patient at [place].  Please see the patient instructions which may contain other information and recommendations beyond what is mentioned above in the assessment and plan.  Orders Placed This Encounter  Procedures  . Tdap vaccine greater than or equal to 7yo IM  . HM PAP SMEAR  . TSH  . Lipid Panel w/o Chol/HDL Ratio  . Comprehensive metabolic panel  . CBC with Differential/Platelet  . VITAMIN D 25 Hydroxy (Vit-D Deficiency, Fractures)  . Hgb A1c w/o eAG  . Ambulatory referral to Nutrition and Diabetic Education

## 2016-01-14 ENCOUNTER — Encounter: Payer: Self-pay | Admitting: Family Medicine

## 2016-01-14 LAB — CBC WITH DIFFERENTIAL/PLATELET
Basophils Absolute: 0 10*3/uL (ref 0.0–0.2)
Basos: 0 %
EOS (ABSOLUTE): 0.2 10*3/uL (ref 0.0–0.4)
EOS: 3 %
HEMATOCRIT: 40.1 % (ref 34.0–46.6)
HEMOGLOBIN: 13.3 g/dL (ref 11.1–15.9)
IMMATURE GRANS (ABS): 0 10*3/uL (ref 0.0–0.1)
IMMATURE GRANULOCYTES: 1 %
LYMPHS ABS: 2.6 10*3/uL (ref 0.7–3.1)
LYMPHS: 36 %
MCH: 30.1 pg (ref 26.6–33.0)
MCHC: 33.2 g/dL (ref 31.5–35.7)
MCV: 91 fL (ref 79–97)
MONOCYTES: 6 %
Monocytes Absolute: 0.4 10*3/uL (ref 0.1–0.9)
Neutrophils Absolute: 4 10*3/uL (ref 1.4–7.0)
Neutrophils: 54 %
Platelets: 193 10*3/uL (ref 150–379)
RBC: 4.42 x10E6/uL (ref 3.77–5.28)
RDW: 13.7 % (ref 12.3–15.4)
WBC: 7.2 10*3/uL (ref 3.4–10.8)

## 2016-01-14 LAB — COMPREHENSIVE METABOLIC PANEL
ALBUMIN: 4.3 g/dL (ref 3.5–5.5)
ALK PHOS: 67 IU/L (ref 39–117)
ALT: 47 IU/L — ABNORMAL HIGH (ref 0–32)
AST: 38 IU/L (ref 0–40)
Albumin/Globulin Ratio: 1.9 (ref 1.1–2.5)
BUN / CREAT RATIO: 15 (ref 8–20)
BUN: 10 mg/dL (ref 6–20)
Bilirubin Total: 0.8 mg/dL (ref 0.0–1.2)
CO2: 23 mmol/L (ref 18–29)
CREATININE: 0.66 mg/dL (ref 0.57–1.00)
Calcium: 9.3 mg/dL (ref 8.7–10.2)
Chloride: 100 mmol/L (ref 96–106)
GFR calc Af Amer: 130 mL/min/{1.73_m2} (ref >59)
GFR calc non Af Amer: 112 mL/min/{1.73_m2} (ref >59)
GLUCOSE: 98 mg/dL (ref 65–99)
Globulin, Total: 2.3 g/dL (ref 1.5–4.5)
Potassium: 4.6 mmol/L (ref 3.5–5.2)
Sodium: 137 mmol/L (ref 134–144)
TOTAL PROTEIN: 6.6 g/dL (ref 6.0–8.5)

## 2016-01-14 LAB — LIPID PANEL W/O CHOL/HDL RATIO
Cholesterol, Total: 190 mg/dL (ref 100–199)
HDL: 34 mg/dL — AB (ref >39)
LDL Calculated: 112 mg/dL — ABNORMAL HIGH (ref 0–99)
Triglycerides: 219 mg/dL — ABNORMAL HIGH (ref 0–149)
VLDL Cholesterol Cal: 44 mg/dL — ABNORMAL HIGH (ref 5–40)

## 2016-01-14 LAB — VITAMIN D 25 HYDROXY (VIT D DEFICIENCY, FRACTURES): VIT D 25 HYDROXY: 20.4 ng/mL — AB (ref 30.0–100.0)

## 2016-01-14 LAB — HGB A1C W/O EAG: Hgb A1c MFr Bld: 6.1 % — ABNORMAL HIGH (ref 4.8–5.6)

## 2016-01-14 LAB — TSH: TSH: 1.91 u[IU]/mL (ref 0.450–4.500)

## 2016-01-18 ENCOUNTER — Encounter: Payer: Self-pay | Admitting: Family Medicine

## 2016-01-18 LAB — PAP LB AND HPV HIGH-RISK
HPV, high-risk: NEGATIVE
PAP Smear Comment: 0

## 2016-02-07 ENCOUNTER — Encounter: Payer: Self-pay | Admitting: Family Medicine

## 2016-02-08 ENCOUNTER — Ambulatory Visit: Payer: 59 | Admitting: Family Medicine

## 2016-02-08 NOTE — Telephone Encounter (Signed)
Message sent to patient, advised her Dr. lada has no more openings here and that she can call [facility] if she wants to continue to see Dr. Sanda Klein or can call us to see Dr.Johnson or Malachy Mood.

## 2016-02-09 ENCOUNTER — Ambulatory Visit: Payer: 59 | Admitting: Family Medicine

## 2016-02-09 ENCOUNTER — Encounter: Payer: Self-pay | Admitting: Physician Assistant

## 2016-02-09 ENCOUNTER — Ambulatory Visit: Payer: Self-pay | Admitting: Physician Assistant

## 2016-02-09 VITALS — BP 120/80 | HR 80 | Temp 98.6°F

## 2016-02-09 NOTE — Progress Notes (Signed)
S: c/o left ankle pain, felt a popping sensation this morning, did not lose ROM, is just painful to walk on for long period of time, recently started working out doing classes on [employer], denies swelling or bruising  O: vitals wnl, nad, achilles intact, area at deltoid ligaments of ankle are a little tender, no bony tenderness in ankle or foot, full rom, n/v intact  A: acute ankle pain  P: continue to wear ace wrap, ice, nsaids, if worsening return to clinic for eval

## 2016-02-10 ENCOUNTER — Ambulatory Visit: Payer: Self-pay | Admitting: Physician Assistant

## 2016-02-10 ENCOUNTER — Telehealth: Payer: Self-pay | Admitting: Family Medicine

## 2016-02-10 MED ORDER — MELOXICAM 15 MG PO TABS: 15.0000 mg | ORAL_TABLET | Freq: Every day | ORAL | Status: DC

## 2016-02-10 NOTE — Telephone Encounter (Signed)
Patient called stating that she went to [facility] for left ankle pain, burning sensation around the ankle. She saw a PA and they recommended ibuprofen but is possible get Mobic 15 mg from PCP. She has an appt Tuesday, she wants to know if she can get enough to last her until then. Thanks, pharmacy: [place].

## 2016-02-10 NOTE — Telephone Encounter (Signed)
Rx sent to her pharmacy 

## 2016-02-10 NOTE — Telephone Encounter (Signed)
Patient called stating that she went to [facility] for left ankle pain, burning sensation around the ankle. She saw a PA and they recommended ibuprofen but is possible get Mobic 15 mg from PCP. She has an appt Tuesday, she wants to know if she can get enough to last her until then. Thanks, pharmacy: [place].

## 2016-02-15 ENCOUNTER — Encounter: Payer: 59 | Attending: Family Medicine | Admitting: *Deleted

## 2016-02-15 ENCOUNTER — Encounter: Payer: Self-pay | Admitting: *Deleted

## 2016-02-15 ENCOUNTER — Encounter: Payer: Self-pay | Admitting: Family Medicine

## 2016-02-15 ENCOUNTER — Ambulatory Visit (INDEPENDENT_AMBULATORY_CARE_PROVIDER_SITE_OTHER): Payer: 59 | Admitting: Family Medicine

## 2016-02-15 VITALS — BP 126/82 | HR 70 | Temp 99.5°F | Ht 59.0 in | Wt 159.0 lb

## 2016-02-15 VITALS — BP 120/86 | Ht 60.0 in | Wt 159.9 lb

## 2016-02-15 DIAGNOSIS — R1011 Right upper quadrant pain: Secondary | ICD-10-CM | POA: Insufficient documentation

## 2016-02-15 DIAGNOSIS — E669 Obesity, unspecified: Secondary | ICD-10-CM

## 2016-02-15 DIAGNOSIS — R7301 Impaired fasting glucose: Secondary | ICD-10-CM | POA: Diagnosis not present

## 2016-02-15 DIAGNOSIS — F329 Major depressive disorder, single episode, unspecified: Secondary | ICD-10-CM | POA: Diagnosis not present

## 2016-02-15 DIAGNOSIS — E041 Nontoxic single thyroid nodule: Secondary | ICD-10-CM | POA: Diagnosis not present

## 2016-02-15 DIAGNOSIS — E119 Type 2 diabetes mellitus without complications: Secondary | ICD-10-CM

## 2016-02-15 DIAGNOSIS — F32A Depression, unspecified: Secondary | ICD-10-CM

## 2016-02-15 MED ORDER — LIRAGLUTIDE -WEIGHT MANAGEMENT 18 MG/3ML ~~LOC~~ SOPN: 0.6000 mg | PEN_INJECTOR | Freq: Every day | SUBCUTANEOUS | Status: DC

## 2016-02-15 MED ORDER — ESCITALOPRAM OXALATE 10 MG PO TABS: 10.0000 mg | ORAL_TABLET | Freq: Every day | ORAL | Status: DC

## 2016-02-15 NOTE — Progress Notes (Signed)
Diabetes Self-Management Education  Visit Type: First/Initial  Appt. Start Time: 0920 Appt. End Time: [id number]  02/15/2016  Ms. Ashley Wiley, identified by name and date of birth, is a 40 y.o. female with a diagnosis of Diabetes: Type 2.   ASSESSMENT  Blood pressure 120/86, height 5' (1.524 m), weight 159 lb 14.4 oz (72.53 kg), last menstrual period 02/15/2016. Body mass index is 31.23 kg/(m^2).      Diabetes Self-Management Education - 02/15/16 1024    Visit Information   Visit Type First/Initial   Initial Visit   Diabetes Type Type 2   Are you currently following a meal plan? Yes   What type of meal plan do you follow? cut back on bread and sweets   Are you taking your medications as prescribed? Yes   Date Diagnosed 1 year ago   Health Coping   How would you rate your overall health? Good   Psychosocial Assessment   Patient Belief/Attitude about Diabetes Motivated to manage diabetes  "stressful" pt's father has diabetes and multiple complications   Self-care barriers None   Self-management support Doctor's office;Family   Patient Concerns Nutrition/Meal planning;Weight Control;Problem Solving;Healthy Lifestyle   Special Needs None   Preferred Learning Style Auditory;Visual   Learning Readiness Change in progress   How often do you need to have someone help you when you read instructions, pamphlets, or other written materials from your doctor or pharmacy? 1 - Never   What is the last grade level you completed in school? CMA   Complications   Last HgB A1C per patient/outside source 6.1 %  01/13/16   How often do you check your blood sugar? 3-4 times / week  testing weekly   Fasting Blood glucose range (mg/dL) 70-129   Have you had a dilated eye exam in the past 12 months? No   Have you had a dental exam in the past 12 months? Yes   Are you checking your feet? Yes   How many days per week are you checking your feet? 7   Dietary Intake   Breakfast coffee   Snack (morning)  fruit (apple, orange, strawberries)   Lunch salad, sometimes chicken   Snack (afternoon) fruit, trail mix   Dinner grilled chicken or fish or steak, rice - small amt, 1/4 baked potato, small corn tortillas   Beverage(s) water, 2-3 oz regular soda 3 x week   Exercise   Exercise Type Moderate (swimming / aerobic walking)  Exercise class 2 x week; bike and strength training 3 x week    How many days per week to you exercise? 5   How many minutes per day do you exercise? 45   Total minutes per week of exercise 225   Patient Education   Previous Diabetes Education Yes (please comment)  had 1 appt with RD when first diagnosed   Disease state  Factors that contribute to the development of diabetes   Nutrition management  Role of diet in the treatment of diabetes and the relationship between the three main macronutrients and blood glucose level;Food label reading, portion sizes and measuring food.   Physical activity and exercise  Role of exercise on diabetes management, blood pressure control and cardiac health.   Medications Reviewed patients medication for diabetes, action, purpose, timing of dose and side effects.   Monitoring Purpose and frequency of SMBG.;Identified appropriate SMBG and/or A1C goals.;Yearly dilated eye exam   Chronic complications Relationship between chronic complications and blood glucose control   Psychosocial adjustment  Identified and addressed patients feelings and concerns about diabetes   Individualized Goals (developed by patient)   Reducing Risk Prevent diabetes complications Lose weight Lead a healthier lifestyle   Outcomes   Expected Outcomes Demonstrated interest in learning. Expect positive outcomes   Future DMSE 2 months      Individualized Plan for Diabetes Self-Management Training:   Learning Objective:  Patient will have a greater understanding of diabetes self-management. Patient education plan is to attend individual and/or group sessions per  assessed needs and concerns.   Plan:   Patient Instructions  Check blood sugars 2 x day before breakfast or 2 hrs after supper  3 x week Exercise: Continue program for  45-60 minutes 5 days a week Avoid sugar sweetened drinks (soda)  Eat 3 meals day, 1-2 snacks a day Space meals 4-6 hours apart Bring blood sugar records to the next class   Expected Outcomes:  Demonstrated interest in learning. Expect positive outcomes  Education material provided:  General Meal Planning Guidelines Simple Meal Plan  If problems or questions, patient to contact team via:   Johny Drilling, RN, CCM, CDE [phone]  Future DSME appointment: 2 months

## 2016-02-15 NOTE — Assessment & Plan Note (Signed)
Order [place] of thyroid; discussed risk of thyroid cancer; once [place] back, then start medicine

## 2016-02-15 NOTE — Progress Notes (Signed)
BP 126/82 mmHg  Pulse 70  Temp(Src) 99.5 F (37.5 C)  Ht 4\' 11"  (1.499 m)  Wt 159 lb (72.122 kg)  BMI 32.10 kg/m2  SpO2 100%  LMP 02/15/2016 (Exact Date)   Subjective:    Patient ID: Ashley Wiley, female    DOB: 04-14-1976, 40 y.o.   MRN: [id number]  HPI: Ashley Wiley is a 40 y.o. female  Chief Complaint  Patient presents with  . Follow-up  . Obesity  . Anxiety    Patient states it's ok. Patient couldn't take Fluoxetine.    She is concerned about her weight; she read about the medicines; she was not interested in Chauncey; she cannot take Wellbutrin-containing medicine; she is interested in the shot; she feels comfortable giving herself shots (Saxenda); no fam hx of MEN-1; no personal hx of thyroid cancer, but has had a thyroid nodule US Soft Tissue Head/Neck   Status: Final result       PACS Images     Show images for US Soft Tissue Head/Neck     Study Result     CLINICAL DATA: 40 year old female with a history of thyroid nodules  EXAM: THYROID ULTRASOUND  TECHNIQUE: Ultrasound examination of the thyroid gland and adjacent soft tissues was performed.  COMPARISON: None.  FINDINGS: Right thyroid lobe  Measurements: 4.2 cm x 1.6 cm x 1.7 cm. No nodules visualized.  Left thyroid lobe  Measurements: 4.1 cm x 1.3 cm x 1.5 cm. Sub cm nodule on the left.  Isthmus  Thickness: 4 mm. No nodules visualized.  Lymphadenopathy  None visualized.  IMPRESSION: Unremarkable sonographic survey of the thyroid with benign-appearing nodule on the left.  Signed,  Dulcy Fanny. Earleen Newport, DO  [facility]  [facility]   Electronically Signed  By: Corrie Mckusick D.O.  On: 03/17/2015 18:00    She says her fluoxetine made her feel awful, body was stiff; she has not tried it again; some worrying but just weight, nothing unusual; she gets easily irritated; does not have time for counseling;  she thinks that exercising will help  Relevant past medical, surgical, family and social history reviewed and updated as indicated. Interim medical history since our last visit reviewed. Allergies and medications reviewed and updated.  Review of Systems Per HPI unless specifically indicated above     Objective:    BP 126/82 mmHg  Pulse 70  Temp(Src) 99.5 F (37.5 C)  Ht 4\' 11"  (1.499 m)  Wt 159 lb (72.122 kg)  BMI 32.10 kg/m2  SpO2 100%  LMP 02/15/2016 (Exact Date)  Wt Readings from Last 3 Encounters:  02/15/16 159 lb (72.122 kg)  02/15/16 159 lb 14.4 oz (72.53 kg)  01/13/16 161 lb (73.029 kg)    Physical Exam  Constitutional: She appears well-developed and well-nourished. No distress.  HENT:  Head: Normocephalic and atraumatic.  Eyes: EOM are normal. No scleral icterus.  Neck: No thyromegaly (though some asymmetry, right side perhaps a little larger than left; no palpable nodules) present.  Cardiovascular: Normal rate and regular rhythm.   Pulmonary/Chest: Effort normal and breath sounds normal.  Abdominal: She exhibits no distension. There is tenderness (RUQ). There is no guarding.  Musculoskeletal: She exhibits no edema.  Neurological: She is alert.  Skin: She is not diaphoretic. No pallor.  Psychiatric: She has a normal mood and affect.   Results for orders placed or performed in visit on 01/13/16  HM PAP SMEAR  Result Value Ref Range   HM Pap  smear from PP- negative   TSH  Result Value Ref Range   TSH 1.910 0.450 - 4.500 uIU/mL  Lipid Panel w/o Chol/HDL Ratio  Result Value Ref Range   Cholesterol, Total 190 100 - 199 mg/dL   Triglycerides 219 (H) 0 - 149 mg/dL   HDL 34 (L) >39 mg/dL   VLDL Cholesterol Cal 44 (H) 5 - 40 mg/dL   LDL Calculated 112 (H) 0 - 99 mg/dL  Comprehensive metabolic panel  Result Value Ref Range   Glucose 98 65 - 99 mg/dL   BUN 10 6 - 20 mg/dL   Creatinine, Ser 0.66 0.57 - 1.00 mg/dL   GFR calc non Af Amer 112 >59 mL/min/1.73   GFR  calc Af Amer 130 >59 mL/min/1.73   BUN/Creatinine Ratio 15 8 - 20   Sodium 137 134 - 144 mmol/L   Potassium 4.6 3.5 - 5.2 mmol/L   Chloride 100 96 - 106 mmol/L   CO2 23 18 - 29 mmol/L   Calcium 9.3 8.7 - 10.2 mg/dL   Total Protein 6.6 6.0 - 8.5 g/dL   Albumin 4.3 3.5 - 5.5 g/dL   Globulin, Total 2.3 1.5 - 4.5 g/dL   Albumin/Globulin Ratio 1.9 1.1 - 2.5   Bilirubin Total 0.8 0.0 - 1.2 mg/dL   Alkaline Phosphatase 67 39 - 117 IU/L   AST 38 0 - 40 IU/L   ALT 47 (H) 0 - 32 IU/L  CBC with Differential/Platelet  Result Value Ref Range   WBC 7.2 3.4 - 10.8 x10E3/uL   RBC 4.42 3.77 - 5.28 x10E6/uL   Hemoglobin 13.3 11.1 - 15.9 g/dL   Hematocrit 40.1 34.0 - 46.6 %   MCV 91 79 - 97 fL   MCH 30.1 26.6 - 33.0 pg   MCHC 33.2 31.5 - 35.7 g/dL   RDW 13.7 12.3 - 15.4 %   Platelets 193 150 - 379 x10E3/uL   Neutrophils 54 %   Lymphs 36 %   Monocytes 6 %   Eos 3 %   Basos 0 %   Neutrophils Absolute 4.0 1.4 - 7.0 x10E3/uL   Lymphocytes Absolute 2.6 0.7 - 3.1 x10E3/uL   Monocytes Absolute 0.4 0.1 - 0.9 x10E3/uL   EOS (ABSOLUTE) 0.2 0.0 - 0.4 x10E3/uL   Basophils Absolute 0.0 0.0 - 0.2 x10E3/uL   Immature Granulocytes 1 %   Immature Grans (Abs) 0.0 0.0 - 0.1 x10E3/uL  VITAMIN D 25 Hydroxy (Vit-D Deficiency, Fractures)  Result Value Ref Range   Vit D, 25-Hydroxy 20.4 (L) 30.0 - 100.0 ng/mL  Hgb A1c w/o eAG  Result Value Ref Range   Hgb A1c MFr Bld 6.1 (H) 4.8 - 5.6 %  Pap liquid-based and HPV (high risk)  Result Value Ref Range   DIAGNOSIS: Comment    Specimen adequacy: Comment    CLINICIAN PROVIDED ICD10: Comment    Performed by: Comment    PAP SMEAR COMMENT .    Note: Comment    HPV, high-risk Negative Negative      Assessment & Plan:   Problem List Items Addressed This Visit      Endocrine   IFG (impaired fasting glucose)    Weight loss needed to help slow progression to frank diabetes      Thyroid nodule - Primary    Order [place] of thyroid; discussed risk of thyroid cancer;  once [place] back, then start medicine        Other   Depression    Start escitalopram,  which will hopefully be more weight-neutral; re-evaluate in 4 weeks, but call before then if any problems      Relevant Medications   escitalopram (LEXAPRO) 10 MG tablet   Obesity    Talked with patient about weight loss options; she is most interested in [place]; risks discussed including thyroid cancer; we decided that with her hx of thyroid nodule, let's rescan her neck before initiating this; portion control, limiting high calorie foods, adequate water intake all important; increase activity      Relevant Medications   Liraglutide -Weight Management (SAXENDA) 18 MG/3ML SOPN   Right upper quadrant abdominal pain   Relevant Orders   US Abdomen Limited RUQ (Completed)      Follow up plan: Return in about 4 weeks (around 03/14/2016) for follow-up.

## 2016-02-15 NOTE — Patient Instructions (Addendum)
Check blood sugars 2 x day before breakfast or 2 hrs after supper  3 x week Exercise: Continue program for  45-60 minutes 5 days a week Avoid sugar sweetened drinks (soda)  Eat 3 meals day, 1-2 snacks a day Space meals 4-6 hours apart Bring blood sugar records to the next class

## 2016-02-15 NOTE — Patient Instructions (Addendum)
Let's get the thyroid US and the gallbladder US If you develop serious abdominal pain, go to the ER Avoid fatty foods After that has come back, we can start the Saxenda Increase the dose each week as directed Call with any problems Start the escitalopram Return in 4 weeks for follow-up

## 2016-02-16 ENCOUNTER — Other Ambulatory Visit: Payer: Self-pay | Admitting: Family Medicine

## 2016-02-16 DIAGNOSIS — E041 Nontoxic single thyroid nodule: Secondary | ICD-10-CM

## 2016-02-21 ENCOUNTER — Other Ambulatory Visit: Payer: Self-pay | Admitting: Family Medicine

## 2016-02-21 ENCOUNTER — Ambulatory Visit
Admission: RE | Admit: 2016-02-21 | Discharge: 2016-02-21 | Disposition: A | Discharge status: Home / Self Care | Payer: 59 | Source: Ambulatory Visit | Attending: Family Medicine | Admitting: Family Medicine

## 2016-02-21 DIAGNOSIS — E041 Nontoxic single thyroid nodule: Secondary | ICD-10-CM

## 2016-02-21 DIAGNOSIS — R1011 Right upper quadrant pain: Secondary | ICD-10-CM

## 2016-02-22 ENCOUNTER — Encounter: Payer: Self-pay | Admitting: Family Medicine

## 2016-02-22 DIAGNOSIS — R1011 Right upper quadrant pain: Secondary | ICD-10-CM

## 2016-02-22 NOTE — Assessment & Plan Note (Signed)
Order H pylori breath test

## 2016-02-24 NOTE — Telephone Encounter (Signed)
Ashley Wiley, can you please help her with this? I entered the order on the 4th Thanks

## 2016-03-07 ENCOUNTER — Encounter: Payer: Self-pay | Admitting: Family Medicine

## 2016-03-07 NOTE — Assessment & Plan Note (Signed)
Weight loss needed to help slow progression to frank diabetes

## 2016-03-07 NOTE — Assessment & Plan Note (Addendum)
Talked with patient about weight loss options; she is most interested in [place]; risks discussed including thyroid cancer; we decided that with her hx of thyroid nodule, let's rescan her neck before initiating this; portion control, limiting high calorie foods, adequate water intake all important; increase activity

## 2016-03-07 NOTE — Assessment & Plan Note (Signed)
Start escitalopram, which will hopefully be more weight-neutral; re-evaluate in 4 weeks, but call before then if any problems

## 2016-03-08 ENCOUNTER — Encounter: Payer: Self-pay | Admitting: Family Medicine

## 2016-03-14 ENCOUNTER — Other Ambulatory Visit: Payer: Self-pay | Admitting: Family Medicine

## 2016-03-14 DIAGNOSIS — R1011 Right upper quadrant pain: Secondary | ICD-10-CM | POA: Diagnosis not present

## 2016-03-19 LAB — H. PYLORI BREATH TEST: H. pylori UBiT: NEGATIVE

## 2016-03-27 ENCOUNTER — Encounter: Payer: Self-pay | Admitting: Dietician

## 2016-03-27 ENCOUNTER — Encounter: Payer: 59 | Attending: Family Medicine | Admitting: Dietician

## 2016-03-27 VITALS — Ht 60.0 in | Wt 164.0 lb

## 2016-03-27 DIAGNOSIS — E119 Type 2 diabetes mellitus without complications: Secondary | ICD-10-CM | POA: Insufficient documentation

## 2016-03-27 NOTE — Progress Notes (Signed)

## 2016-04-04 ENCOUNTER — Telehealth: Payer: Self-pay | Admitting: Dietician

## 2016-04-04 NOTE — Telephone Encounter (Signed)
Called patient regarding missed class on 04/03/16. Encouraged her to come next week for class 3 and reschedule class 2 at that time, and to call if unable to come to class 3 on 04/10/16.

## 2016-04-10 ENCOUNTER — Encounter: Payer: 59 | Admitting: Dietician

## 2016-04-10 ENCOUNTER — Encounter: Payer: Self-pay | Admitting: Dietician

## 2016-04-10 VITALS — BP 138/94 | Wt 161.4 lb

## 2016-04-10 DIAGNOSIS — E119 Type 2 diabetes mellitus without complications: Secondary | ICD-10-CM | POA: Diagnosis not present

## 2016-04-10 NOTE — Progress Notes (Signed)

## 2016-05-01 ENCOUNTER — Encounter: Payer: 59 | Attending: Family Medicine | Admitting: *Deleted

## 2016-05-01 VITALS — Wt 161.4 lb

## 2016-05-01 DIAGNOSIS — E119 Type 2 diabetes mellitus without complications: Secondary | ICD-10-CM | POA: Insufficient documentation

## 2016-05-02 ENCOUNTER — Encounter: Payer: Self-pay | Admitting: *Deleted

## 2016-05-02 NOTE — Progress Notes (Signed)

## 2016-05-09 ENCOUNTER — Encounter: Payer: Self-pay | Admitting: *Deleted

## 2016-05-19 ENCOUNTER — Other Ambulatory Visit: Payer: Self-pay | Admitting: Family Medicine

## 2016-05-19 ENCOUNTER — Ambulatory Visit (INDEPENDENT_AMBULATORY_CARE_PROVIDER_SITE_OTHER): Payer: 59 | Admitting: Family Medicine

## 2016-05-19 ENCOUNTER — Encounter: Payer: Self-pay | Admitting: Family Medicine

## 2016-05-19 VITALS — BP 122/83 | HR 69 | Temp 98.0°F | Ht 59.7 in | Wt 159.0 lb

## 2016-05-19 DIAGNOSIS — E785 Hyperlipidemia, unspecified: Secondary | ICD-10-CM | POA: Diagnosis not present

## 2016-05-19 DIAGNOSIS — E041 Nontoxic single thyroid nodule: Secondary | ICD-10-CM

## 2016-05-19 DIAGNOSIS — F329 Major depressive disorder, single episode, unspecified: Secondary | ICD-10-CM

## 2016-05-19 DIAGNOSIS — F419 Anxiety disorder, unspecified: Secondary | ICD-10-CM | POA: Diagnosis not present

## 2016-05-19 DIAGNOSIS — M25561 Pain in right knee: Secondary | ICD-10-CM

## 2016-05-19 DIAGNOSIS — R7301 Impaired fasting glucose: Secondary | ICD-10-CM | POA: Diagnosis not present

## 2016-05-19 DIAGNOSIS — M25562 Pain in left knee: Secondary | ICD-10-CM | POA: Diagnosis not present

## 2016-05-19 DIAGNOSIS — Z113 Encounter for screening for infections with a predominantly sexual mode of transmission: Secondary | ICD-10-CM

## 2016-05-19 DIAGNOSIS — E669 Obesity, unspecified: Secondary | ICD-10-CM | POA: Diagnosis not present

## 2016-05-19 DIAGNOSIS — F32A Depression, unspecified: Secondary | ICD-10-CM

## 2016-05-19 LAB — UA/M W/RFLX CULTURE, ROUTINE
BILIRUBIN UA: NEGATIVE
GLUCOSE, UA: NEGATIVE
Ketones, UA: NEGATIVE
Leukocytes, UA: NEGATIVE
Nitrite, UA: NEGATIVE
PH UA: 5.5 (ref 5.0–7.5)
PROTEIN UA: NEGATIVE
RBC UA: NEGATIVE
Urobilinogen, Ur: 0.2 mg/dL (ref 0.2–1.0)

## 2016-05-19 LAB — BAYER DCA HB A1C WAIVED: HB A1C: 5.8 % (ref <7.0)

## 2016-05-19 MED ORDER — FLUOXETINE HCL 40 MG PO CAPS
40.0000 mg | ORAL_CAPSULE | Freq: Every day | ORAL | Status: DC
Start: 2016-05-19 — End: 2016-11-08

## 2016-05-19 MED ORDER — METFORMIN HCL ER 500 MG PO TB24: 1000.0000 mg | ORAL_TABLET | Freq: Every evening | ORAL | Status: DC

## 2016-05-19 NOTE — Assessment & Plan Note (Signed)
Under good control. Continue current regimen. Continue to monitor.  

## 2016-05-19 NOTE — Assessment & Plan Note (Signed)
Rechecking labs today. Await results. Will consider contrave. Information given to patient today. She will check with insurance to see if it's covered.

## 2016-05-19 NOTE — Progress Notes (Addendum)
BP 122/83 mmHg  Pulse 69  Temp(Src) 98 F (36.7 C)  Ht 4' 11.7" (1.516 m)  Wt 159 lb (72.122 kg)  BMI 31.38 kg/m2  SpO2 96%  LMP 04/24/2016 (Approximate)   Subjective:    Patient ID: Ashley Wiley, female    DOB: 08/23/1976, 40 y.o.   MRN: [id number]  HPI: Ashley Wiley is a 40 y.o. female who presents today changing providers from a previous doctor at this practice and concerned about the following:   Chief Complaint  Patient presents with  . Diabetes  . Anxiety   IFG Hypoglycemic episodes:no Polydipsia/polyuria: yes Visual disturbance: no Chest pain: no Paresthesias: yes- elbows down to her hands, has had tendonitis has had surgery for this Glucose Monitoring: yes  Accucheck frequency: Daily  Fasting glucose: 102-103 Taking Insulin?: no Blood Pressure Monitoring: not checking  ANXIETY/STRESS- still there, but better. Started on fluoxetine. Took it for 1 day and stopped. Started on lexapro and couldn't tolerate it. Now taking the fluoxetine for 4 weeks and feeling like it's helping a bit Duration:better Anxious mood: yes  Excessive worrying: yes Irritability: yes  Sweating: no Nausea: no Palpitations:no Hyperventilation: no Panic attacks: no Agoraphobia: no  Obscessions/compulsions: no Depressed mood: no Depression screen [facility] 2/9 02/15/2016 01/13/2016  Decreased Interest 0 0  Down, Depressed, Hopeless 0 0  PHQ - 2 Score 0 0   GAD 7 : Generalized Anxiety Score 05/19/2016 02/15/2016 01/13/2016  Nervous, Anxious, on Edge 2 1 2   Control/stop worrying 1 1 1   Worry too much - different things 1 1 3   Trouble relaxing 1 0 3  Restless 1 0 3  Easily annoyed or irritable 1 1 3   Afraid - awful might happen 1 1 2   Total GAD 7 Score 8 5 17   Anxiety Difficulty Somewhat difficult Somewhat difficult Somewhat difficult   Anhedonia: no Weight changes: no Insomnia: no   Hypersomnia: no Fatigue/loss of energy: no Feelings of worthlessness: no Feelings of guilt:  no Impaired concentration/indecisiveness: no Suicidal ideations: no  Crying spells: no Recent Stressors/Life Changes: no   Does 30 min to hour a day of walking, 2x a week strengthening and toning class for 45 minutes, still having trouble losing weight. Feels like she is having pain in her knees. Uses ice on it. Takes tylenol or ibuprofen and that helps. Pain is always there. Worse with activity.   Thyroid nodule checked by [place] in April- stable with no changes. Needs recheck in 12 months.   Relevant past medical, surgical, family and social history reviewed and updated as indicated. Interim medical history since our last visit reviewed. Allergies and medications reviewed and updated.  Review of Systems  Constitutional: Negative.   Respiratory: Negative.   Cardiovascular: Negative.   Musculoskeletal: Positive for joint swelling and arthralgias. Negative for myalgias, back pain, gait problem, neck pain and neck stiffness.  Psychiatric/Behavioral: Negative.     Per HPI unless specifically indicated above     Objective:    BP 122/83 mmHg  Pulse 69  Temp(Src) 98 F (36.7 C)  Ht 4' 11.7" (1.516 m)  Wt 159 lb (72.122 kg)  BMI 31.38 kg/m2  SpO2 96%  LMP 04/24/2016 (Approximate)  Wt Readings from Last 3 Encounters:  05/19/16 159 lb (72.122 kg)  05/01/16 161 lb 6.4 oz (73.211 kg)  04/10/16 161 lb 6.4 oz (73.211 kg)    Physical Exam  Constitutional: She is oriented to person, place, and time. She appears well-developed and well-nourished. No distress.  HENT:  Head: Normocephalic and atraumatic.  Right Ear: Hearing normal.  Left Ear: Hearing normal.  Nose: Nose normal.  Eyes: Conjunctivae and lids are normal. Right eye exhibits no discharge. Left eye exhibits no discharge. No scleral icterus.  Cardiovascular: Normal rate, regular rhythm, normal heart sounds and intact distal pulses.  Exam reveals no gallop and no friction rub.   No murmur heard. Pulmonary/Chest: Effort normal and  breath sounds normal. No respiratory distress. She has no wheezes. She has no rales. She exhibits no tenderness.  Musculoskeletal: Normal range of motion.  Neurological: She is alert and oriented to person, place, and time.  Skin: Skin is warm, dry and intact. No rash noted. No erythema. No pallor.  Psychiatric: She has a normal mood and affect. Her speech is normal and behavior is normal. Judgment and thought content normal. Cognition and memory are normal.  Nursing note and vitals reviewed.   Results for orders placed or performed in visit on 03/14/16  H. pylori breath test  Result Value Ref Range   H. pylori UBiT Negative Negative      Assessment & Plan:   Problem List Items Addressed This Visit      Endocrine   IFG (impaired fasting glucose)    Under good control. Continue current regimen. Continue to monitor.       Thyroid nodule    Rechecking TSH today. Await results.         Other   Hyperlipidemia    Rechecking levels today. Await results.       Depression    Doing a bit better. Will increase prozac to 40mg  and recheck in 1 month.       Relevant Medications   FLUoxetine (PROZAC) 40 MG capsule   Obesity    Rechecking labs today. Await results. Will consider contrave. Information given to patient today. She will check with insurance to see if it's covered.       Relevant Medications   metFORMIN (GLUCOPHAGE XR) 500 MG 24 hr tablet   Acute anxiety    Doing a bit better. Will increase prozac to 40mg  and recheck in 1 month.       Relevant Medications   FLUoxetine (PROZAC) 40 MG capsule    Other Visit Diagnoses    Screening for STD (sexually transmitted disease)    -  Primary    Checking labs today. Await results.     Relevant Orders    HIV antibody    Arthralgia of both knees        Work on exercise. Insoles. Ice, rest, ibuprofen. Stretches given. Let us know if not getting better or getting worse.         Follow up plan: Return in about 4 weeks (around  06/16/2016) for recheck mood.

## 2016-05-19 NOTE — Assessment & Plan Note (Signed)
Rechecking TSH today. Await results.  

## 2016-05-19 NOTE — Patient Instructions (Addendum)
Generic Knee Exercises EXERCISES RANGE OF MOTION (ROM) AND STRETCHING EXERCISES These exercises may help you when beginning to rehabilitate your injury. Your symptoms may resolve with or without further involvement from your physician, physical therapist, or athletic trainer. While completing these exercises, remember:   Restoring tissue flexibility helps normal motion to return to the joints. This allows healthier, less painful movement and activity.  An effective stretch should be held for at least 30 seconds.  A stretch should never be painful. You should only feel a gentle lengthening or release in the stretched tissue. STRETCH - Knee Extension, Prone  Lie on your stomach on a firm surface, such as a bed or countertop. Place your right / left knee and leg just beyond the edge of the surface. You may wish to place a towel under the far end of your right / left thigh for comfort.  Relax your leg muscles and allow gravity to straighten your knee. Your clinician may advise you to add an ankle weight if more resistance is helpful for you.  You should feel a stretch in the back of your right / left knee. Hold this position for __________ seconds. Repeat __________ times. Complete this stretch __________ times per day. * Your physician, physical therapist, or athletic trainer may ask you to add ankle weight to enhance your stretch.  RANGE OF MOTION - Knee Flexion, Active  Lie on your back with both knees straight. (If this causes back discomfort, bend your opposite knee, placing your foot flat on the floor.)  Slowly slide your heel back toward your buttocks until you feel a gentle stretch in the front of your knee or thigh.  Hold for __________ seconds. Slowly slide your heel back to the starting position. Repeat __________ times. Complete this exercise __________ times per day.  STRETCH - Quadriceps, Prone   Lie on your stomach on a firm surface, such as a bed or padded floor.  Bend your  right / left knee and grasp your ankle. If you are unable to reach your ankle or pant leg, use a belt around your foot to lengthen your reach.  Gently pull your heel toward your buttocks. Your knee should not slide out to the side. You should feel a stretch in the front of your thigh and/or knee.  Hold this position for __________ seconds. Repeat __________ times. Complete this stretch __________ times per day.  STRETCH - Hamstrings, Supine   Lie on your back. Loop a belt or towel over the ball of your right / left foot.  Straighten your right / left knee and slowly pull on the belt to raise your leg. Do not allow the right / left knee to bend. Keep your opposite leg flat on the floor.  Raise the leg until you feel a gentle stretch behind your right / left knee or thigh. Hold this position for __________ seconds. Repeat __________ times. Complete this stretch __________ times per day.  STRENGTHENING EXERCISES These exercises may help you when beginning to rehabilitate your injury. They may resolve your symptoms with or without further involvement from your physician, physical therapist, or athletic trainer. While completing these exercises, remember:   Muscles can gain both the endurance and the strength needed for everyday activities through controlled exercises.  Complete these exercises as instructed by your physician, physical therapist, or athletic trainer. Progress the resistance and repetitions only as guided.  You may experience muscle soreness or fatigue, but the pain or discomfort you are trying to   eliminate should never worsen during these exercises. If this pain does worsen, stop and make certain you are following the directions exactly. If the pain is still present after adjustments, discontinue the exercise until you can discuss the trouble with your clinician. STRENGTH - Quadriceps, Isometrics  Lie on your back with your right / left leg extended and your opposite knee  bent.  Gradually tense the muscles in the front of your right / left thigh. You should see either your knee cap slide up toward your hip or increased dimpling just above the knee. This motion will push the back of the knee down toward the floor/mat/bed on which you are lying.  Hold the muscle as tight as you can without increasing your pain for __________ seconds.  Relax the muscles slowly and completely in between each repetition. Repeat __________ times. Complete this exercise __________ times per day.  STRENGTH - Quadriceps, Short Arcs   Lie on your back. Place a __________ inch towel roll under your knee so that the knee slightly bends.  Raise only your lower leg by tightening the muscles in the front of your thigh. Do not allow your thigh to rise.  Hold this position for __________ seconds. Repeat __________ times. Complete this exercise __________ times per day.  OPTIONAL ANKLE WEIGHTS: Begin with ____________________, but DO NOT exceed ____________________. Increase in 1 pound/0.5 kilogram increments.  STRENGTH - Quadriceps, Straight Leg Raises  Quality counts! Watch for signs that the quadriceps muscle is working to insure you are strengthening the correct muscles and not "cheating" by substituting with healthier muscles.  Lay on your back with your right / left leg extended and your opposite knee bent.  Tense the muscles in the front of your right / left thigh. You should see either your knee cap slide up or increased dimpling just above the knee. Your thigh may even quiver.  Tighten these muscles even more and raise your leg 4 to 6 inches off the floor. Hold for __________ seconds.  Keeping these muscles tense, lower your leg.  Relax the muscles slowly and completely in between each repetition. Repeat __________ times. Complete this exercise __________ times per day.  STRENGTH - Hamstring, Curls  Lay on your stomach with your legs extended. (If you lay on a bed, your feet  may hang over the edge.)  Tighten the muscles in the back of your thigh to bend your right / left knee up to 90 degrees. Keep your hips flat on the bed/floor.  Hold this position for __________ seconds.  Slowly lower your leg back to the starting position. Repeat __________ times. Complete this exercise __________ times per day.  OPTIONAL ANKLE WEIGHTS: Begin with ____________________, but DO NOT exceed ____________________. Increase in 1 pound/0.5 kilogram increments.  STRENGTH - Quadriceps, Squats  Stand in a door frame so that your feet and knees are in line with the frame.  Use your hands for balance, not support, on the frame.  Slowly lower your weight, bending at the hips and knees. Keep your lower legs upright so that they are parallel with the door frame. Squat only within the range that does not increase your knee pain. Never let your hips drop below your knees.  Slowly return upright, pushing with your legs, not pulling with your hands. Repeat __________ times. Complete this exercise __________ times per day.  STRENGTH - Quadriceps, Wall Slides  Follow guidelines for form closely. Increased knee pain often results from poorly placed feet or knees.    Lean against a smooth wall or door and walk your feet out 18-24 inches. Place your feet hip-width apart.  Slowly slide down the wall or door until your knees bend __________ degrees.* Keep your knees over your heels, not your toes, and in line with your hips, not falling to either side.  Hold for __________ seconds. Stand up to rest for __________ seconds in between each repetition. Repeat __________ times. Complete this exercise __________ times per day. * Your physician, physical therapist, or athletic trainer will alter this angle based on your symptoms and progress.   This information is not intended to replace advice given to you by your health care provider. Make sure you discuss any questions you have with your health care  provider.   Document Released: 09/20/2005 Document Revised: 11/27/2014 Document Reviewed: 02/18/2009 Elsevier Interactive Patient Education 2016 [place]. Bupropion; Naltrexone extended-release tablets What is this medicine? BUPROPION; NALTREXONE (byoo PROE pee on; nal TREX one) is a combination product used to promote and maintain weight loss in obese adults or overweight adults who also have weight related medical problems. This medicine should be used with a reduced calorie diet and increased physical activity. This medicine may be used for other purposes; ask your health care provider or pharmacist if you have questions. What should I tell my health care provider before I take this medicine? They need to know if you have any of these conditions: -an eating disorder, such as anorexia or bulimia -diabetes -glaucoma -head injury -heart disease -high blood pressure -history of a drug or alcohol abuse problem -history of a tumor or infection of your brain or spine -history of stroke -history of irregular heartbeat -kidney disease -liver disease -mental illness such as bipolar disorder or psychosis -seizures -suicidal thoughts, plans, or attempt; a previous suicide attempt by you or a family member -an unusual or allergic reaction to bupropion, naltrexone, other medicines, foods, dyes, or preservatives breast-feeding -pregnant or trying to become pregnant How should I use this medicine? Take this medicine by mouth with a glass of water. Follow the directions on the prescription label. Take this medicine in the morning and in the evenings as directed by your healthcare professional. Dennis Bast can take it with or without food. Do not take with high-fat meals as this may increase your risk of seizures. Do not crush, chew, or cut these tablets. Do not take your medicine more often than directed. Do not stop taking this medicine suddenly except upon the advice of your doctor. A special MedGuide  will be given to you by the pharmacist with each prescription and refill. Be sure to read this information carefully each time. Talk to your pediatrician regarding the use of this medicine in children. Special care may be needed. Overdosage: If you think you have taken too much of this medicine contact a poison control center or emergency room at once. NOTE: This medicine is only for you. Do not share this medicine with others. What if I miss a dose? If you miss a dose, skip the missed dose and take your next tablet at the regular time. Do not take double or extra doses. What may interact with this medicine? Do not take this medicine with any of the following medications: -any prescription or street opioid drug like codiene, heroin, methadone -linezolid -MAOIs like Carbex, Eldepryl, Marplan, Nardil, and Parnate -methylene blue (injected into a vein) -other medicines that contain bupropion like Zyban or Wellbutrin This medicine may also interact with the following medications: -alcohol -  certain medicines for anxiety or sleep -certain medicines for blood pressure like metoprolol, propranolol -certain medicines for depression or psychotic disturbances -certain medicines for HIV or AIDS like efavirenz, lopinavir, nelfinavir, ritonavir -certain medicines for irregular heart beat like propafenone, flecainide -certain medicines for Parkinson's disease like amantadine, levodopa -certain medicines for seizures like carbamazepine, phenytoin, phenobarbital -cimetidine -clopidogrel -cyclophosphamide -disulfiram -furazolidone -isoniazid -nicotine -orphenadrine -procarbazine -steroid medicines like prednisone or cortisone -stimulant medicines for attention disorders, weight loss, or to stay awake -tamoxifen -theophylline -thioridazine -thiotepa -ticlopidine -tramadol -warfarin This list may not describe all possible interactions. Give your health care provider a list of all the medicines,  herbs, non-prescription drugs, or dietary supplements you use. Also tell them if you smoke, drink alcohol, or use illegal drugs. Some items may interact with your medicine. What should I watch for while using this medicine? This medicine is intended to be used in addition to a healthy diet and appropriate exercise. The best results are achieved this way. Do not increase or in any way change your dose without consulting your doctor or health care professional. Do not take this medicine with other prescription or over-the-counter weight loss products without consulting your doctor or health care professional. Your doctor should tell you to stop taking this medicine if you do not lose a certain amount of weight within the first 12 weeks of treatment. Visit your doctor or health care professional for regular checkups. Your doctor may order blood tests or other tests to see how you are doing. This medicine may affect blood sugar levels. If you have diabetes, check with your doctor or health care professional before you change your diet or the dose of your diabetic medicine. Patients and their families should watch out for new or worsening depression or thoughts of suicide. Also watch out for sudden changes in feelings such as feeling anxious, agitated, panicky, irritable, hostile, aggressive, impulsive, severely restless, overly excited and hyperactive, or not being able to sleep. If this happens, especially at the beginning of treatment or after a change in dose, call your health care professional. Avoid alcoholic drinks while taking this medicine. Drinking large amounts of alcoholic beverages, using sleeping or anxiety medicines, or quickly stopping the use of these agents while taking this medicine may increase your risk for a seizure. What side effects may I notice from receiving this medicine? Side effects that you should report to your doctor or health care professional as soon as possible: -allergic  reactions like skin rash, itching or hives, swelling of the face, lips, or tongue -breathing problems -changes in vision, hearing -chest pain -confusion -dark urine -depressed mood -fast or irregular heart beat -fever -hallucination, loss of contact with reality -increased blood pressure -light-colored stools -redness, blistering, peeling or loosening of the skin, including inside the mouth -right upper belly pain -seizures -suicidal thoughts or other mood changes -unusually weak or tired -vomiting -yellowing of the eyes or skin Side effects that usually do not require medical attention (Report these to your doctor or health care professional if they continue or are bothersome.): -constipation -diarrhea -dizziness -dry mouth -headache -nausea -trouble sleeping This list may not describe all possible side effects. Call your doctor for medical advice about side effects. You may report side effects to FDA at 1-800-FDA-1088. Where should I keep my medicine? Keep out of the reach of children. Store at room temperature between 15 and 30 degrees C (59 and 86 degrees F). Throw away any unused medicine after the expiration date. NOTE: This  sheet is a summary. It may not cover all possible information. If you have questions about this medicine, talk to your doctor, pharmacist, or health care provider.    2016, Elsevier/Gold Standard. (2013-08-13 15:17:29)

## 2016-05-19 NOTE — Assessment & Plan Note (Signed)
Doing a bit better. Will increase prozac to 40mg  and recheck in 1 month.

## 2016-05-19 NOTE — Assessment & Plan Note (Signed)
Rechecking levels today. Await results.  

## 2016-05-20 LAB — COMPREHENSIVE METABOLIC PANEL
A/G RATIO: 2.2 (ref 1.2–2.2)
ALBUMIN: 4.4 g/dL (ref 3.5–5.5)
ALT: 68 IU/L — ABNORMAL HIGH (ref 0–32)
AST: 54 IU/L — ABNORMAL HIGH (ref 0–40)
Alkaline Phosphatase: 71 IU/L (ref 39–117)
BILIRUBIN TOTAL: 0.6 mg/dL (ref 0.0–1.2)
BUN / CREAT RATIO: 14 (ref 9–23)
BUN: 8 mg/dL (ref 6–20)
CHLORIDE: 100 mmol/L (ref 96–106)
CO2: 22 mmol/L (ref 18–29)
Calcium: 9 mg/dL (ref 8.7–10.2)
Creatinine, Ser: 0.56 mg/dL — ABNORMAL LOW (ref 0.57–1.00)
GFR calc non Af Amer: 119 mL/min/{1.73_m2} (ref >59)
GFR, EST AFRICAN AMERICAN: 137 mL/min/{1.73_m2} (ref >59)
GLOBULIN, TOTAL: 2 g/dL (ref 1.5–4.5)
Glucose: 89 mg/dL (ref 65–99)
POTASSIUM: 4.5 mmol/L (ref 3.5–5.2)
Sodium: 137 mmol/L (ref 134–144)
TOTAL PROTEIN: 6.4 g/dL (ref 6.0–8.5)

## 2016-05-20 LAB — CBC WITH DIFFERENTIAL/PLATELET
BASOS ABS: 0 10*3/uL (ref 0.0–0.2)
Basos: 0 %
EOS (ABSOLUTE): 0.2 10*3/uL (ref 0.0–0.4)
Eos: 3 %
HEMATOCRIT: 33.5 % — AB (ref 34.0–46.6)
HEMOGLOBIN: 11.1 g/dL (ref 11.1–15.9)
IMMATURE GRANS (ABS): 0 10*3/uL (ref 0.0–0.1)
Immature Granulocytes: 1 %
LYMPHS: 36 %
Lymphocytes Absolute: 2.4 10*3/uL (ref 0.7–3.1)
MCH: 29.1 pg (ref 26.6–33.0)
MCHC: 33.1 g/dL (ref 31.5–35.7)
MCV: 88 fL (ref 79–97)
MONOCYTES: 9 %
MONOS ABS: 0.6 10*3/uL (ref 0.1–0.9)
Neutrophils Absolute: 3.4 10*3/uL (ref 1.4–7.0)
Neutrophils: 51 %
Platelets: 216 10*3/uL (ref 150–379)
RBC: 3.81 x10E6/uL (ref 3.77–5.28)
RDW: 15.2 % (ref 12.3–15.4)
WBC: 6.6 10*3/uL (ref 3.4–10.8)

## 2016-05-20 LAB — HIV ANTIBODY (ROUTINE TESTING W REFLEX): HIV SCREEN 4TH GENERATION: NONREACTIVE

## 2016-05-20 LAB — LIPID PANEL W/O CHOL/HDL RATIO
Cholesterol, Total: 198 mg/dL (ref 100–199)
HDL: 31 mg/dL — ABNORMAL LOW (ref >39)
LDL Calculated: 104 mg/dL — ABNORMAL HIGH (ref 0–99)
Triglycerides: 315 mg/dL — ABNORMAL HIGH (ref 0–149)
VLDL CHOLESTEROL CAL: 63 mg/dL — AB (ref 5–40)

## 2016-05-20 LAB — TSH: TSH: 2.12 u[IU]/mL (ref 0.450–4.500)

## 2016-05-22 ENCOUNTER — Encounter: Payer: Self-pay | Admitting: Family Medicine

## 2016-07-27 ENCOUNTER — Telehealth: Payer: Self-pay | Admitting: Family Medicine

## 2016-07-27 NOTE — Telephone Encounter (Signed)
Prescription was sent to [facility] in June for 60 with 6 refills, patient should have enough medication until December.Advise patient to speak with the pharmacist, not the [facility] system.

## 2016-11-08 ENCOUNTER — Other Ambulatory Visit: Payer: Self-pay | Admitting: Family Medicine

## 2016-12-05 ENCOUNTER — Encounter: Payer: Self-pay | Admitting: Physician Assistant

## 2016-12-05 ENCOUNTER — Ambulatory Visit: Payer: Self-pay | Admitting: Physician Assistant

## 2016-12-05 VITALS — BP 108/80 | HR 70 | Temp 98.4°F

## 2016-12-05 DIAGNOSIS — J0100 Acute maxillary sinusitis, unspecified: Secondary | ICD-10-CM

## 2016-12-05 MED ORDER — AMOXICILLIN-POT CLAVULANATE 875-125 MG PO TABS: 1.0000 | ORAL_TABLET | Freq: Two times a day (BID) | ORAL | 0 refills | Status: DC

## 2016-12-05 MED ORDER — METHYLPREDNISOLONE 4 MG PO TBPK: ORAL_TABLET | ORAL | 0 refills | Status: DC

## 2016-12-05 MED ORDER — FLUCONAZOLE 150 MG PO TABS: ORAL_TABLET | ORAL | 0 refills | Status: DC

## 2016-12-05 NOTE — Progress Notes (Signed)
S: C/o runny nose, sinus pain, congestion for 8 days, no fever, chills, cp/sob, v/d; mucus is green and thick, cough is sporadic, c/o of facial and dental pain. Hx of sinus surgery  Using otc meds: claritin flonase  O: PE: vitals wnl, nad, perrl eomi, normocephalic, tms dull, nasal mucosa red and swollen, throat injected, neck supple no lymph, lungs c t a, cv rrr, neuro intact  A:  Acute sinusitis   P: drink fluids, continue regular meds , use otc meds of choice, return if not improving in 5 days, return earlier if worsening , augmentin, medrol dose, diflucan

## 2016-12-29 ENCOUNTER — Telehealth: Payer: 59 | Admitting: Family

## 2016-12-29 DIAGNOSIS — R21 Rash and other nonspecific skin eruption: Secondary | ICD-10-CM

## 2016-12-29 MED ORDER — PREDNISONE 10 MG (21) PO TBPK: 10.0000 mg | ORAL_TABLET | ORAL | 0 refills | Status: DC

## 2016-12-29 NOTE — Progress Notes (Signed)
E Visit for Rash  We are sorry that you are not feeling well. Here is how we plan to help!    Sterapred 10 mg dose pak  I have ordered the magic mouthwash as you requested also.   HOME CARE:   Take cool showers and avoid direct sunlight.  Apply cool compress or wet dressings.  Take a bath in an oatmeal bath.  Sprinkle content of one Aveeno packet under running faucet with comfortably warm water.  Bathe for 15-20 minutes, 1-2 times daily.  Pat dry with a towel. Do not rub the rash.  Use hydrocortisone cream.  Take an antihistamine like Benadryl for widespread rashes that itch.  The adult dose of Benadryl is 25-50 mg by mouth 4 times daily.  Caution:  This type of medication may cause sleepiness.  Do not drink alcohol, drive, or operate dangerous machinery while taking antihistamines.  Do not take these medications if you have prostate enlargement.  Read package instructions thoroughly on all medications that you take.  GET HELP RIGHT AWAY IF:   Symptoms don't go away after treatment.  Severe itching that persists.  If you rash spreads or swells.  If you rash begins to smell.  If it blisters and opens or develops a yellow-brown crust.  You develop a fever.  You have a sore throat.  You become short of breath.  MAKE SURE YOU:  Understand these instructions. Will watch your condition. Will get help right away if you are not doing well or get worse.  Thank you for choosing an e-visit. Your e-visit answers were reviewed by a board certified advanced clinical practitioner to complete your personal care plan. Depending upon the condition, your plan could have included both over the counter or prescription medications. Please review your pharmacy choice. Be sure that the pharmacy you have chosen is open so that you can pick up your prescription now.  If there is a problem you may message your provider in [place] to have the prescription routed to another pharmacy. Your  safety is important to [place]. If you have drug allergies check your prescription carefully.  For the next 24 hours, you can use MyChart to ask questions about today's visit, request a non-urgent call back, or ask for a work or school excuse from your e-visit provider. You will get an email in the next two days asking about your experience. I hope that your e-visit has been valuable and will speed your recovery.

## 2017-01-16 ENCOUNTER — Encounter: Payer: Self-pay | Admitting: Family Medicine

## 2017-01-16 ENCOUNTER — Encounter: Payer: 59 | Admitting: Family Medicine

## 2017-01-16 ENCOUNTER — Ambulatory Visit (INDEPENDENT_AMBULATORY_CARE_PROVIDER_SITE_OTHER): Payer: 59 | Admitting: Family Medicine

## 2017-01-16 VITALS — BP 121/83 | HR 83 | Temp 99.2°F | Resp 17 | Ht 59.11 in | Wt 155.0 lb

## 2017-01-16 DIAGNOSIS — R7401 Elevation of levels of liver transaminase levels: Secondary | ICD-10-CM

## 2017-01-16 DIAGNOSIS — B9689 Other specified bacterial agents as the cause of diseases classified elsewhere: Secondary | ICD-10-CM

## 2017-01-16 DIAGNOSIS — N898 Other specified noninflammatory disorders of vagina: Secondary | ICD-10-CM

## 2017-01-16 DIAGNOSIS — J301 Allergic rhinitis due to pollen: Secondary | ICD-10-CM

## 2017-01-16 DIAGNOSIS — E6609 Other obesity due to excess calories: Secondary | ICD-10-CM | POA: Diagnosis not present

## 2017-01-16 DIAGNOSIS — Z8619 Personal history of other infectious and parasitic diseases: Secondary | ICD-10-CM | POA: Diagnosis not present

## 2017-01-16 DIAGNOSIS — N760 Acute vaginitis: Secondary | ICD-10-CM | POA: Diagnosis not present

## 2017-01-16 DIAGNOSIS — R7301 Impaired fasting glucose: Secondary | ICD-10-CM

## 2017-01-16 DIAGNOSIS — R740 Nonspecific elevation of levels of transaminase and lactic acid dehydrogenase [LDH]: Secondary | ICD-10-CM | POA: Diagnosis not present

## 2017-01-16 DIAGNOSIS — Z0001 Encounter for general adult medical examination with abnormal findings: Secondary | ICD-10-CM

## 2017-01-16 DIAGNOSIS — E559 Vitamin D deficiency, unspecified: Secondary | ICD-10-CM

## 2017-01-16 DIAGNOSIS — E66811 Obesity, class 1: Secondary | ICD-10-CM

## 2017-01-16 DIAGNOSIS — Z0000 Encounter for general adult medical examination without abnormal findings: Secondary | ICD-10-CM | POA: Diagnosis not present

## 2017-01-16 DIAGNOSIS — E041 Nontoxic single thyroid nodule: Secondary | ICD-10-CM | POA: Diagnosis not present

## 2017-01-16 DIAGNOSIS — F331 Major depressive disorder, recurrent, moderate: Secondary | ICD-10-CM | POA: Diagnosis not present

## 2017-01-16 DIAGNOSIS — E782 Mixed hyperlipidemia: Secondary | ICD-10-CM | POA: Diagnosis not present

## 2017-01-16 LAB — UA/M W/RFLX CULTURE, ROUTINE
BILIRUBIN UA: NEGATIVE
GLUCOSE, UA: NEGATIVE
KETONES UA: NEGATIVE
Leukocytes, UA: NEGATIVE
NITRITE UA: NEGATIVE
Protein, UA: NEGATIVE
RBC UA: NEGATIVE
SPEC GRAV UA: 1.01 (ref 1.005–1.030)
UUROB: 0.2 mg/dL (ref 0.2–1.0)
pH, UA: 7 (ref 5.0–7.5)

## 2017-01-16 LAB — WET PREP FOR TRICH, YEAST, CLUE
Clue Cell Exam: POSITIVE — AB
TRICHOMONAS EXAM: NEGATIVE
Yeast Exam: NEGATIVE

## 2017-01-16 LAB — BAYER DCA HB A1C WAIVED: HB A1C (BAYER DCA - WAIVED): 6.1 % (ref <7.0)

## 2017-01-16 LAB — MICROALBUMIN, URINE WAIVED
Creatinine, Urine Waived: 10 mg/dL (ref 10–300)
Microalb, Ur Waived: 10 mg/L (ref 0–19)

## 2017-01-16 MED ORDER — FLUOXETINE HCL 40 MG PO CAPS: 40.0000 mg | ORAL_CAPSULE | Freq: Every day | ORAL | 3 refills | Status: DC

## 2017-01-16 MED ORDER — METRONIDAZOLE 500 MG PO TABS: 500.0000 mg | ORAL_TABLET | Freq: Two times a day (BID) | ORAL | 0 refills | Status: DC

## 2017-01-16 MED ORDER — METFORMIN HCL ER 500 MG PO TB24: 1000.0000 mg | ORAL_TABLET | Freq: Every evening | ORAL | 6 refills | Status: DC

## 2017-01-16 MED ORDER — MOMETASONE FUROATE 50 MCG/ACT NA SUSP: 2.0000 | Freq: Every day | NASAL | 12 refills | Status: DC

## 2017-01-16 NOTE — Assessment & Plan Note (Signed)
A1c up to 6.1. She is very upset about this. Continue metformin. Recheck 3 months. Order in.

## 2017-01-16 NOTE — Assessment & Plan Note (Signed)
Not doing well on the flonase. Will try her on nasonex. Call if not helping or if not improving.

## 2017-01-16 NOTE — Progress Notes (Signed)
BP 121/83 (BP Location: Right Arm, Patient Position: Sitting, Cuff Size: Normal)   Pulse 83   Temp 99.2 F (37.3 C) (Oral)   Resp 17   Ht 4' 11.11" (1.501 m)   Wt 155 lb (70.3 kg)   LMP 01/01/2017   SpO2 98%   BMI 31.19 kg/m    Subjective:    Patient ID: Ashley Wiley, female    DOB: 09/13/1976, 41 y.o.   MRN: [id number]  HPI: Ashley Wiley is a 41 y.o. female presenting on 01/16/2017 for comprehensive medical examination. Current medical complaints include:  Impaired Fasting Glucose HbA1C:  Lab Results  Component Value Date   HGBA1C 6.1 (H) 01/13/2016  Duration of elevated blood sugar: chronic Polydipsia: no Polyuria: no Weight change: no Visual disturbance: no Glucose Monitoring: yes    Accucheck frequency: Daily Diabetic Education: Completed Family history of diabetes: yes  DEPRESSION- meds were too expensive. Hasn't been taking them and she knows she needs them Mood status: uncontrolled Satisfied with current treatment?: no Symptom severity: moderate  Duration of current treatment : chronic Side effects: no Medication compliance: poor compliance Psychotherapy/counseling: no  Depressed mood: yes Anxious mood: yes Anhedonia: yes Significant weight loss or gain: no Insomnia: no  Fatigue: yes Feelings of worthlessness or guilt: yes Impaired concentration/indecisiveness: yes Suicidal ideations: no Hopelessness: no Crying spells: yes  VAGINAL DISCHARGE Duration: weeks Discharge description: white  Pruritus: yes Dysuria: no Malodorous: no Urinary frequency: no Fevers: no Abdominal pain: no  Sexual activity: practicing safe sex History of sexually transmitted diseases: no Recent antibiotic use: yes Context: worse  Treatments attempted: antifungal  UPPER RESPIRATORY TRACT INFECTION Duration: Since January Worst symptom: sinus pressure Fever: no Cough: no Shortness of breath: no Wheezing: no Chest pain: yes, with cough Chest tightness:  no Chest congestion: no Nasal congestion: yes Runny nose: no Post nasal drip: yes Sneezing: no Sore throat: no Swollen glands: no Sinus pressure: yes Headache: no Face pain: yes Toothache: no Ear pain: no  Ear pressure: no  Eyes red/itching:no Eye drainage/crusting: no  Vomiting: no Rash: no Fatigue: yes Sick contacts: yes Strep contacts: no  Context: worse Recurrent sinusitis: no Relief with OTC cold/cough medications: no  Treatments attempted: flonase- not better   She currently lives with: kids Menopausal Symptoms: no  Depression Screen done today and results listed below:  Depression screen [facility] 2/9 01/16/2017 02/15/2016 01/13/2016  Decreased Interest 0 0 0  Down, Depressed, Hopeless 0 0 0  PHQ - 2 Score 0 0 0   Past Medical History:  Past Medical History:  Diagnosis Date  . Allergy   . Depression   . Dermatofibroma   . Facial pain   . Hyperlipidemia   . IFG (impaired fasting glucose)   . Low HDL (under 40)   . Overweight   . Patella-femoral syndrome   . Pinguecula   . Thyroid nodule april 2016  . Vitamin D deficiency disease     Surgical History:  Past Surgical History:  Procedure Laterality Date  . CARPAL TUNNEL RELEASE    . GANGLION CYST EXCISION Left   . NASAL SINUS SURGERY  Nov 2015  . TUBAL LIGATION      Medications:  Current Outpatient Prescriptions on File Prior to Visit  Medication Sig  . Cholecalciferol (VITAMIN D) 2000 UNITS CAPS Take 5,000 Units by mouth daily. Reported on 01/13/2016  . cyanocobalamin 1000 MCG tablet Take 2,000 mcg by mouth daily. Reported on 01/13/2016  . fluticasone (FLONASE) 50 MCG/ACT  nasal spray Place 2 sprays into both nostrils daily.  Marland Kitchen glucose blood (BAYER CONTOUR TEST) test strip 1 each by Other route as needed for other. Use as instructed  . loratadine (CLARITIN) 10 MG tablet Take 10 mg by mouth daily. Reported on 01/13/2016   No current facility-administered medications on file prior to visit.     Allergies:   No Known Allergies  Social History:  Social History   Social History  . Marital status: Married    Spouse name: N/A  . Number of children: N/A  . Years of education: N/A   Occupational History  . Not on file.   Social History Main Topics  . Smoking status: Never Smoker  . Smokeless tobacco: Never Used  . Alcohol use 0.0 - 0.6 oz/week     Comment: on occasion  . Drug use: No  . Sexual activity: Yes   Other Topics Concern  . Not on file   Social History Narrative  . No narrative on file   History  Smoking Status  . Never Smoker  Smokeless Tobacco  . Never Used   History  Alcohol Use  . 0.0 - 0.6 oz/week    Comment: on occasion    Family History:  Family History  Problem Relation Age of Onset  . Diabetes Father   . Hypertension Father   . Heart disease Father   . Kidney disease Father   . Stroke Father   . Diabetes Paternal Grandfather     Past medical history, surgical history, medications, allergies, family history and social history reviewed with patient today and changes made to appropriate areas of the chart.   Review of Systems  Constitutional: Negative.   HENT: Positive for sinus pain. Negative for congestion, ear discharge, ear pain, hearing loss, nosebleeds, sore throat and tinnitus.   Eyes: Negative.   Respiratory: Negative.  Negative for stridor.   Cardiovascular: Negative.   Gastrointestinal: Positive for constipation and heartburn. Negative for abdominal pain, blood in stool, diarrhea, melena, nausea and vomiting.  Genitourinary: Negative.        Vaginal discharge- thick and white  Musculoskeletal: Positive for myalgias. Negative for back pain, falls, joint pain and neck pain.  Skin: Negative.   Neurological: Positive for tingling. Negative for dizziness, tremors, sensory change, speech change, focal weakness, seizures, loss of consciousness and headaches.  Endo/Heme/Allergies: Positive for environmental allergies. Negative for polydipsia.  Bruises/bleeds easily.  Psychiatric/Behavioral: Positive for depression. Negative for hallucinations, memory loss, substance abuse and suicidal ideas. The patient is nervous/anxious. The patient does not have insomnia.     All other ROS negative except what is listed above and in the HPI.      Objective:    BP 121/83 (BP Location: Right Arm, Patient Position: Sitting, Cuff Size: Normal)   Pulse 83   Temp 99.2 F (37.3 C) (Oral)   Resp 17   Ht 4' 11.11" (1.501 m)   Wt 155 lb (70.3 kg)   LMP 01/01/2017   SpO2 98%   BMI 31.19 kg/m   Wt Readings from Last 3 Encounters:  01/16/17 155 lb (70.3 kg)  05/19/16 159 lb (72.1 kg)  05/01/16 161 lb 6.4 oz (73.2 kg)    Physical Exam  Constitutional: She is oriented to person, place, and time. She appears well-developed and well-nourished. No distress.  HENT:  Head: Normocephalic and atraumatic.  Right Ear: Hearing and external ear normal.  Left Ear: Hearing and external ear normal.  Nose: Nose normal.  Mouth/Throat:  No oropharyngeal exudate.  Eyes: Conjunctivae, EOM and lids are normal. Pupils are equal, round, and reactive to light. Right eye exhibits no discharge. Left eye exhibits no discharge. No scleral icterus.  Neck: Normal range of motion. Neck supple. No JVD present. No tracheal deviation present. No thyromegaly present.  Cardiovascular: Normal rate, regular rhythm, normal heart sounds and intact distal pulses.  Exam reveals no gallop and no friction rub.   No murmur heard. Pulmonary/Chest: Effort normal and breath sounds normal. No stridor. No respiratory distress. She has no wheezes. She has no rales. She exhibits no tenderness.  Abdominal: Bowel sounds are normal. She exhibits no distension and no mass. There is no tenderness. There is no rebound and no guarding.  Genitourinary: Vaginal discharge found.  Genitourinary Comments: Breast exam and internal exam deferred- she seed GYN   Musculoskeletal: Normal range of motion. She  exhibits no edema, tenderness or deformity.  Lymphadenopathy:    She has no cervical adenopathy.  Neurological: She is alert and oriented to person, place, and time. She has normal reflexes. She displays normal reflexes. No cranial nerve deficit. She exhibits normal muscle tone. Coordination normal.  Skin: Skin is warm, dry and intact. No rash noted. She is not diaphoretic. No erythema. No pallor.  Psychiatric: She has a normal mood and affect. Her speech is normal and behavior is normal. Judgment and thought content normal. Cognition and memory are normal.  Nursing note and vitals reviewed.   Results for orders placed or performed in visit on 05/19/16  CBC with Differential/Platelet  Result Value Ref Range   WBC 6.6 3.4 - 10.8 x10E3/uL   RBC 3.81 3.77 - 5.28 x10E6/uL   Hemoglobin 11.1 11.1 - 15.9 g/dL   Hematocrit 33.5 (L) 34.0 - 46.6 %   MCV 88 79 - 97 fL   MCH 29.1 26.6 - 33.0 pg   MCHC 33.1 31.5 - 35.7 g/dL   RDW 15.2 12.3 - 15.4 %   Platelets 216 150 - 379 x10E3/uL   Neutrophils 51 %   Lymphs 36 %   Monocytes 9 %   Eos 3 %   Basos 0 %   Neutrophils Absolute 3.4 1.4 - 7.0 x10E3/uL   Lymphocytes Absolute 2.4 0.7 - 3.1 x10E3/uL   Monocytes Absolute 0.6 0.1 - 0.9 x10E3/uL   EOS (ABSOLUTE) 0.2 0.0 - 0.4 x10E3/uL   Basophils Absolute 0.0 0.0 - 0.2 x10E3/uL   Immature Granulocytes 1 %   Immature Grans (Abs) 0.0 0.0 - 0.1 x10E3/uL  Bayer DCA Hb A1c Waived  Result Value Ref Range   Bayer DCA Hb A1c Waived 5.8 <7.0 %  Comprehensive metabolic panel  Result Value Ref Range   Glucose 89 65 - 99 mg/dL   BUN 8 6 - 20 mg/dL   Creatinine, Ser 0.56 (L) 0.57 - 1.00 mg/dL   GFR calc non Af Amer 119 >59 mL/min/1.73   GFR calc Af Amer 137 >59 mL/min/1.73   BUN/Creatinine Ratio 14 9 - 23   Sodium 137 134 - 144 mmol/L   Potassium 4.5 3.5 - 5.2 mmol/L   Chloride 100 96 - 106 mmol/L   CO2 22 18 - 29 mmol/L   Calcium 9.0 8.7 - 10.2 mg/dL   Total Protein 6.4 6.0 - 8.5 g/dL   Albumin 4.4  3.5 - 5.5 g/dL   Globulin, Total 2.0 1.5 - 4.5 g/dL   Albumin/Globulin Ratio 2.2 1.2 - 2.2   Bilirubin Total 0.6 0.0 - 1.2 mg/dL   Alkaline  Phosphatase 71 39 - 117 IU/L   AST 54 (H) 0 - 40 IU/L   ALT 68 (H) 0 - 32 IU/L  Lipid Panel w/o Chol/HDL Ratio  Result Value Ref Range   Cholesterol, Total 198 100 - 199 mg/dL   Triglycerides 315 (H) 0 - 149 mg/dL   HDL 31 (L) >39 mg/dL   VLDL Cholesterol Cal 63 (H) 5 - 40 mg/dL   LDL Calculated 104 (H) 0 - 99 mg/dL  TSH  Result Value Ref Range   TSH 2.120 0.450 - 4.500 uIU/mL  UA/M w/rflx Culture, Routine  Result Value Ref Range   Specific Gravity, UA <1.005 (L) 1.005 - 1.030   pH, UA 5.5 5.0 - 7.5   Color, UA Yellow Yellow   Appearance Ur Clear Clear   Leukocytes, UA Negative Negative   Protein, UA Negative Negative/Trace   Glucose, UA Negative Negative   Ketones, UA Negative Negative   RBC, UA Negative Negative   Bilirubin, UA Negative Negative   Urobilinogen, Ur 0.2 0.2 - 1.0 mg/dL   Nitrite, UA Negative Negative  HIV antibody  Result Value Ref Range   HIV Screen 4th Generation wRfx Non Reactive Non Reactive      Assessment & Plan:   Problem List Items Addressed This Visit      Respiratory   Allergic rhinitis    Not doing well on the flonase. Will try her on nasonex. Call if not helping or if not improving.         Endocrine   IFG (impaired fasting glucose)    A1c up to 6.1. She is very upset about this. Continue metformin. Recheck 3 months. Order in.       Relevant Orders   Bayer DCA Hb A1c Waived   CBC with Differential/Platelet   Comprehensive metabolic panel   Microalbumin, Urine Waived   UA/M w/rflx Culture, Routine   Hgb A1c w/o eAG   Thyroid nodule    Will recheck labs. Await results.       Relevant Orders   CBC with Differential/Platelet   Comprehensive metabolic panel   TSH   UA/M w/rflx Culture, Routine     Other   Hyperlipidemia    Rechecking levels today. Await results.       Relevant Orders    CBC with Differential/Platelet   Comprehensive metabolic panel   Lipid Panel w/o Chol/HDL Ratio   UA/M w/rflx Culture, Routine   Vitamin D deficiency disease    Rechecking levels today. Await results.       Relevant Orders   CBC with Differential/Platelet   Comprehensive metabolic panel   UA/M w/rflx Culture, Routine   VITAMIN D 25 Hydroxy (Vit-D Deficiency, Fractures)   Depression    Not under good control as off meds. Rx sent to [facility] for cost. Call with any concerns.       Relevant Medications   FLUoxetine (PROZAC) 40 MG capsule   Other Relevant Orders   CBC with Differential/Platelet   Comprehensive metabolic panel   UA/M w/rflx Culture, Routine   Obesity   Relevant Medications   phentermine 37.5 MG capsule   metFORMIN (GLUCOPHAGE XR) 500 MG 24 hr tablet   Other Relevant Orders   CBC with Differential/Platelet   Comprehensive metabolic panel   UA/M w/rflx Culture, Routine   Elevated ALT measurement    Rechecking levels today. Await results.       Relevant Orders   CBC with Differential/Platelet   Comprehensive metabolic panel  UA/M w/rflx Culture, Routine   History of HPV infection    Continue to follow with GYN. Call with any concerns.        Other Visit Diagnoses    Routine general medical examination at a health care facility    -  Primary   Up to date on vaccines. Screening labs checked today. Sees GYN for pap. Continue diet and exercise. Call with any concerns.    Relevant Orders   Bayer DCA Hb A1c Waived   CBC with Differential/Platelet   Comprehensive metabolic panel   Lipid Panel w/o Chol/HDL Ratio   Microalbumin, Urine Waived   TSH   UA/M w/rflx Culture, Routine   VITAMIN D 25 Hydroxy (Vit-D Deficiency, Fractures)   Vaginal discharge       +clue cells   Relevant Orders   WET PREP FOR TRICH, YEAST, CLUE   BV (bacterial vaginosis)       Will treat with metronidazole. Call with any concerns or if not getting better or getting worse.     Relevant Medications   metroNIDAZOLE (FLAGYL) 500 MG tablet       Follow up plan: Return in about 6 months (around 07/16/2017) for IFG/Cholesterol/Mood check.   LABORATORY TESTING:  - Pap smear: up to date  IMMUNIZATIONS:   - Tdap: Tetanus vaccination status reviewed: last tetanus booster within 10 years. - Influenza: Up to date - Pneumovax: Not applicable  PATIENT COUNSELING:   Advised to take 1 mg of folate supplement per day if capable of pregnancy.   Sexuality: Discussed sexually transmitted diseases, partner selection, use of condoms, avoidance of unintended pregnancy  and contraceptive alternatives.   Advised to avoid cigarette smoking.  I discussed with the patient that most people either abstain from alcohol or drink within safe limits (<=14/week and <=4 drinks/occasion for males, <=7/weeks and <= 3 drinks/occasion for females) and that the risk for alcohol disorders and other health effects rises proportionally with the number of drinks per week and how often a drinker exceeds daily limits.  Discussed cessation/primary prevention of drug use and availability of treatment for abuse.   Diet: Encouraged to adjust caloric intake to maintain  or achieve ideal body weight, to reduce intake of dietary saturated fat and total fat, to limit sodium intake by avoiding high sodium foods and not adding table salt, and to maintain adequate dietary potassium and calcium preferably from fresh fruits, vegetables, and low-fat dairy products.    stressed the importance of regular exercise  Injury prevention: Discussed safety belts, safety helmets, smoke detector, smoking near bedding or upholstery.   Dental health: Discussed importance of regular tooth brushing, flossing, and dental visits.    NEXT PREVENTATIVE PHYSICAL DUE IN 1 YEAR. Return in about 6 months (around 07/16/2017) for IFG/Cholesterol/Mood check.

## 2017-01-16 NOTE — Assessment & Plan Note (Signed)
Not under good control as off meds. Rx sent to [facility] for cost. Call with any concerns.

## 2017-01-16 NOTE — Patient Instructions (Addendum)
Health Maintenance, Female Adopting a healthy lifestyle and getting preventive care can go a long way to promote health and wellness. Talk with your health care provider about what schedule of regular examinations is right for you. This is a good chance for you to check in with your provider about disease prevention and staying healthy. In between checkups, there are plenty of things you can do on your own. Experts have done a lot of research about which lifestyle changes and preventive measures are most likely to keep you healthy. Ask your health care provider for more information. Weight and diet Eat a healthy diet  Be sure to include plenty of vegetables, fruits, low-fat dairy products, and lean protein.  Do not eat a lot of foods high in solid fats, added sugars, or salt.  Get regular exercise. This is one of the most important things you can do for your health.  Most adults should exercise for at least 150 minutes each week. The exercise should increase your heart rate and make you sweat (moderate-intensity exercise).  Most adults should also do strengthening exercises at least twice a week. This is in addition to the moderate-intensity exercise. Maintain a healthy weight  Body mass index (BMI) is a measurement that can be used to identify possible weight problems. It estimates body fat based on height and weight. Your health care provider can help determine your BMI and help you achieve or maintain a healthy weight.  For females 40 years of age and older:  A BMI below 18.5 is considered underweight.  A BMI of 18.5 to 24.9 is normal.  A BMI of 25 to 29.9 is considered overweight.  A BMI of 30 and above is considered obese. Watch levels of cholesterol and blood lipids  You should start having your blood tested for lipids and cholesterol at 40 years of age, then have this test every 5 years.  You may need to have your cholesterol levels checked more often if:  Your lipid or  cholesterol levels are high.  You are older than 40 years of age.  You are at high risk for heart disease. Cancer screening Lung Cancer  Lung cancer screening is recommended for adults 64-42 years old who are at high risk for lung cancer because of a history of smoking.  A yearly low-dose CT scan of the lungs is recommended for people who:  Currently smoke.  Have quit within the past 15 years.  Have at least a 30-pack-year history of smoking. A pack year is smoking an average of one pack of cigarettes a day for 1 year.  Yearly screening should continue until it has been 15 years since you quit.  Yearly screening should stop if you develop a health problem that would prevent you from having lung cancer treatment. Breast Cancer  Practice breast self-awareness. This means understanding how your breasts normally appear and feel.  It also means doing regular breast self-exams. Let your health care provider know about any changes, no matter how small.  If you are in your 20s or 30s, you should have a clinical breast exam (CBE) by a health care provider every 1-3 years as part of a regular health exam.  If you are 34 or older, have a CBE every year. Also consider having a breast X-ray (mammogram) every year.  If you have a family history of breast cancer, talk to your health care provider about genetic screening.  If you are at high risk for breast cancer, talk  to your health care provider about having an MRI and a mammogram every year.  Breast cancer gene (BRCA) assessment is recommended for women who have family members with BRCA-related cancers. BRCA-related cancers include:  Breast.  Ovarian.  Tubal.  Peritoneal cancers.  Results of the assessment will determine the need for genetic counseling and BRCA1 and BRCA2 testing. Cervical Cancer  Your health care provider may recommend that you be screened regularly for cancer of the pelvic organs (ovaries, uterus, and vagina).  This screening involves a pelvic examination, including checking for microscopic changes to the surface of your cervix (Pap test). You may be encouraged to have this screening done every 3 years, beginning at age 24.  For women ages 66-65, health care providers may recommend pelvic exams and Pap testing every 3 years, or they may recommend the Pap and pelvic exam, combined with testing for human papilloma virus (HPV), every 5 years. Some types of HPV increase your risk of cervical cancer. Testing for HPV may also be done on women of any age with unclear Pap test results.  Other health care providers may not recommend any screening for nonpregnant women who are considered low risk for pelvic cancer and who do not have symptoms. Ask your health care provider if a screening pelvic exam is right for you.  If you have had past treatment for cervical cancer or a condition that could lead to cancer, you need Pap tests and screening for cancer for at least 20 years after your treatment. If Pap tests have been discontinued, your risk factors (such as having a new sexual partner) need to be reassessed to determine if screening should resume. Some women have medical problems that increase the chance of getting cervical cancer. In these cases, your health care provider may recommend more frequent screening and Pap tests. Colorectal Cancer  This type of cancer can be detected and often prevented.  Routine colorectal cancer screening usually begins at 40 years of age and continues through 40 years of age.  Your health care provider may recommend screening at an earlier age if you have risk factors for colon cancer.  Your health care provider may also recommend using home test kits to check for hidden blood in the stool.  A small camera at the end of a tube can be used to examine your colon directly (sigmoidoscopy or colonoscopy). This is done to check for the earliest forms of colorectal cancer.  Routine  screening usually begins at age 41.  Direct examination of the colon should be repeated every 5-10 years through 40 years of age. However, you may need to be screened more often if early forms of precancerous polyps or small growths are found. Skin Cancer  Check your skin from head to toe regularly.  Tell your health care provider about any new moles or changes in moles, especially if there is a change in a mole's shape or color.  Also tell your health care provider if you have a mole that is larger than the size of a pencil eraser.  Always use sunscreen. Apply sunscreen liberally and repeatedly throughout the day.  Protect yourself by wearing long sleeves, pants, a wide-brimmed hat, and sunglasses whenever you are outside. Heart disease, diabetes, and high blood pressure  High blood pressure causes heart disease and increases the risk of stroke. High blood pressure is more likely to develop in:  People who have blood pressure in the high end of the normal range (130-139/85-89 mm Hg).  People who are overweight or obese.  People who are African American.  If you are 37-33 years of age, have your blood pressure checked every 3-5 years. If you are 26 years of age or older, have your blood pressure checked every year. You should have your blood pressure measured twice-once when you are at a hospital or clinic, and once when you are not at a hospital or clinic. Record the average of the two measurements. To check your blood pressure when you are not at a hospital or clinic, you can use:  An automated blood pressure machine at a pharmacy.  A home blood pressure monitor.  If you are between 20 years and 2 years old, ask your health care provider if you should take aspirin to prevent strokes.  Have regular diabetes screenings. This involves taking a blood sample to check your fasting blood sugar level.  If you are at a normal weight and have a low risk for diabetes, have this test once  every three years after 40 years of age.  If you are overweight and have a high risk for diabetes, consider being tested at a younger age or more often. Preventing infection Hepatitis B  If you have a higher risk for hepatitis B, you should be screened for this virus. You are considered at high risk for hepatitis B if:  You were born in a country where hepatitis B is common. Ask your health care provider which countries are considered high risk.  Your parents were born in a high-risk country, and you have not been immunized against hepatitis B (hepatitis B vaccine).  You have HIV or AIDS.  You use needles to inject street drugs.  You live with someone who has hepatitis B.  You have had sex with someone who has hepatitis B.  You get hemodialysis treatment.  You take certain medicines for conditions, including cancer, organ transplantation, and autoimmune conditions. Hepatitis C  Blood testing is recommended for:  Everyone born from [birth year] through 1964.  Anyone with known risk factors for hepatitis C. Sexually transmitted infections (STIs)  You should be screened for sexually transmitted infections (STIs) including gonorrhea and chlamydia if:  You are sexually active and are younger than 40 years of age.  You are older than 40 years of age and your health care provider tells you that you are at risk for this type of infection.  Your sexual activity has changed since you were last screened and you are at an increased risk for chlamydia or gonorrhea. Ask your health care provider if you are at risk.  If you do not have HIV, but are at risk, it may be recommended that you take a prescription medicine daily to prevent HIV infection. This is called pre-exposure prophylaxis (PrEP). You are considered at risk if:  You are sexually active and do not regularly use condoms or know the HIV status of your partner(s).  You take drugs by injection.  You are sexually active with a partner  who has HIV. Talk with your health care provider about whether you are at high risk of being infected with HIV. If you choose to begin PrEP, you should first be tested for HIV. You should then be tested every 3 months for as long as you are taking PrEP. Pregnancy  If you are premenopausal and you may become pregnant, ask your health care provider about preconception counseling.  If you may become pregnant, take 400 to 800 micrograms (mcg) of folic acid  every day.  If you want to prevent pregnancy, talk to your health care provider about birth control (contraception). Osteoporosis and menopause  Osteoporosis is a disease in which the bones lose minerals and strength with aging. This can result in serious bone fractures. Your risk for osteoporosis can be identified using a bone density scan.  If you are 61 years of age or older, or if you are at risk for osteoporosis and fractures, ask your health care provider if you should be screened.  Ask your health care provider whether you should take a calcium or vitamin D supplement to lower your risk for osteoporosis.  Menopause may have certain physical symptoms and risks.  Hormone replacement therapy may reduce some of these symptoms and risks. Talk to your health care provider about whether hormone replacement therapy is right for you. Follow these instructions at home:  Schedule regular health, dental, and eye exams.  Stay current with your immunizations.  Do not use any tobacco products including cigarettes, chewing tobacco, or electronic cigarettes.  If you are pregnant, do not drink alcohol.  If you are breastfeeding, limit how much and how often you drink alcohol.  Limit alcohol intake to no more than 1 drink per day for nonpregnant women. One drink equals 12 ounces of beer, 5 ounces of wine, or 1 ounces of hard liquor.  Do not use street drugs.  Do not share needles.  Ask your health care provider for help if you need support  or information about quitting drugs.  Tell your health care provider if you often feel depressed.  Tell your health care provider if you have ever been abused or do not feel safe at home. This information is not intended to replace advice given to you by your health care provider. Make sure you discuss any questions you have with your health care provider. Document Released: 05/22/2011 Document Revised: 04/13/2016 Document Reviewed: 08/10/2015 Elsevier Interactive Patient Education  2017 [employer].

## 2017-01-16 NOTE — Assessment & Plan Note (Signed)
Rechecking levels today. Await results.  

## 2017-01-16 NOTE — Assessment & Plan Note (Signed)
Will recheck labs. Await results.

## 2017-01-16 NOTE — Assessment & Plan Note (Signed)
Continue to follow with GYN. Call with any concerns.  °

## 2017-01-17 LAB — CBC WITH DIFFERENTIAL/PLATELET
BASOS: 0 %
Basophils Absolute: 0 10*3/uL (ref 0.0–0.2)
EOS (ABSOLUTE): 0.2 10*3/uL (ref 0.0–0.4)
Eos: 2 %
HEMOGLOBIN: 12.9 g/dL (ref 11.1–15.9)
Hematocrit: 39.1 % (ref 34.0–46.6)
IMMATURE GRANS (ABS): 0 10*3/uL (ref 0.0–0.1)
Immature Granulocytes: 0 %
LYMPHS ABS: 2.8 10*3/uL (ref 0.7–3.1)
LYMPHS: 32 %
MCH: 30.4 pg (ref 26.6–33.0)
MCHC: 33 g/dL (ref 31.5–35.7)
MCV: 92 fL (ref 79–97)
MONOCYTES: 5 %
Monocytes Absolute: 0.5 10*3/uL (ref 0.1–0.9)
NEUTROS ABS: 5.2 10*3/uL (ref 1.4–7.0)
Neutrophils: 61 %
Platelets: 226 10*3/uL (ref 150–379)
RBC: 4.25 x10E6/uL (ref 3.77–5.28)
RDW: 13.7 % (ref 12.3–15.4)
WBC: 8.7 10*3/uL (ref 3.4–10.8)

## 2017-01-17 LAB — COMPREHENSIVE METABOLIC PANEL
A/G RATIO: 1.8 (ref 1.2–2.2)
ALT: 31 IU/L (ref 0–32)
AST: 25 IU/L (ref 0–40)
Albumin: 4.2 g/dL (ref 3.5–5.5)
Alkaline Phosphatase: 80 IU/L (ref 39–117)
BILIRUBIN TOTAL: 0.4 mg/dL (ref 0.0–1.2)
BUN / CREAT RATIO: 18 (ref 9–23)
BUN: 11 mg/dL (ref 6–20)
CHLORIDE: 100 mmol/L (ref 96–106)
CO2: 25 mmol/L (ref 18–29)
Calcium: 9.5 mg/dL (ref 8.7–10.2)
Creatinine, Ser: 0.61 mg/dL (ref 0.57–1.00)
GFR calc non Af Amer: 115 mL/min/{1.73_m2} (ref >59)
GFR, EST AFRICAN AMERICAN: 132 mL/min/{1.73_m2} (ref >59)
Globulin, Total: 2.4 g/dL (ref 1.5–4.5)
Glucose: 49 mg/dL — ABNORMAL LOW (ref 65–99)
POTASSIUM: 4.1 mmol/L (ref 3.5–5.2)
SODIUM: 138 mmol/L (ref 134–144)
TOTAL PROTEIN: 6.6 g/dL (ref 6.0–8.5)

## 2017-01-17 LAB — TSH: TSH: 1.89 u[IU]/mL (ref 0.450–4.500)

## 2017-01-17 LAB — LIPID PANEL W/O CHOL/HDL RATIO
Cholesterol, Total: 209 mg/dL — ABNORMAL HIGH (ref 100–199)
HDL: 40 mg/dL (ref >39)
LDL Calculated: 102 mg/dL — ABNORMAL HIGH (ref 0–99)
Triglycerides: 334 mg/dL — ABNORMAL HIGH (ref 0–149)
VLDL Cholesterol Cal: 67 mg/dL — ABNORMAL HIGH (ref 5–40)

## 2017-01-17 LAB — VITAMIN D 25 HYDROXY (VIT D DEFICIENCY, FRACTURES): VIT D 25 HYDROXY: 20.7 ng/mL — AB (ref 30.0–100.0)

## 2017-02-14 ENCOUNTER — Encounter: Payer: Self-pay | Admitting: Family Medicine

## 2017-02-15 MED ORDER — FLUOXETINE HCL 60 MG PO TABS: 60.0000 mg | ORAL_TABLET | Freq: Every day | ORAL | 3 refills | Status: DC

## 2017-02-28 ENCOUNTER — Encounter: Payer: Self-pay | Admitting: Family Medicine

## 2017-03-06 ENCOUNTER — Ambulatory Visit (INDEPENDENT_AMBULATORY_CARE_PROVIDER_SITE_OTHER): Payer: 59 | Admitting: Family Medicine

## 2017-03-06 ENCOUNTER — Encounter: Payer: Self-pay | Admitting: Family Medicine

## 2017-03-06 VITALS — BP 132/82 | HR 88 | Temp 98.5°F | Wt 153.1 lb

## 2017-03-06 DIAGNOSIS — N898 Other specified noninflammatory disorders of vagina: Secondary | ICD-10-CM

## 2017-03-06 MED ORDER — FIRST-DUKES MOUTHWASH MT SUSP: 5.0000 mL | Freq: Four times a day (QID) | OROMUCOSAL | 1 refills | Status: DC

## 2017-03-06 NOTE — Progress Notes (Signed)
BP 132/82   Pulse 88   Temp 98.5 F (36.9 C)   Wt 153 lb 1.6 oz (69.4 kg)   SpO2 100%   BMI 30.81 kg/m    Subjective:    Patient ID: Ashley Wiley, female    DOB: June 11, 1976, 41 y.o.   MRN: [id number]  HPI: Ashley Wiley is a 41 y.o. female  Chief Complaint  Patient presents with  . Vaginal Discharge   VAGINAL DISCHARGE Duration: resolved today Discharge description: clear  Pruritus: no Dysuria: no Malodorous: no Urinary frequency: no Fevers: no Abdominal pain: yes  Sexual activity: practicing safe sex History of sexually transmitted diseases: no Recent antibiotic use: no Context: better  Treatments attempted: none  Relevant past medical, surgical, family and social history reviewed and updated as indicated. Interim medical history since our last visit reviewed. Allergies and medications reviewed and updated.  Review of Systems  Constitutional: Negative.   Respiratory: Negative.   Cardiovascular: Negative.   Genitourinary: Positive for vaginal discharge. Negative for decreased urine volume, difficulty urinating, dyspareunia, dysuria, enuresis, flank pain, frequency, genital sores, hematuria, menstrual problem, pelvic pain, urgency, vaginal bleeding and vaginal pain.  Psychiatric/Behavioral: Negative.     Per HPI unless specifically indicated above     Objective:    BP 132/82   Pulse 88   Temp 98.5 F (36.9 C)   Wt 153 lb 1.6 oz (69.4 kg)   SpO2 100%   BMI 30.81 kg/m   Wt Readings from Last 3 Encounters:  03/06/17 153 lb 1.6 oz (69.4 kg)  01/16/17 155 lb (70.3 kg)  05/19/16 159 lb (72.1 kg)    Physical Exam  Constitutional: She is oriented to person, place, and time. She appears well-developed and well-nourished. No distress.  HENT:  Head: Normocephalic and atraumatic.  Right Ear: Hearing normal.  Left Ear: Hearing normal.  Nose: Nose normal.  Eyes: Conjunctivae and lids are normal. Right eye exhibits no discharge. Left eye exhibits no  discharge. No scleral icterus.  Cardiovascular: Normal rate, regular rhythm, normal heart sounds and intact distal pulses.  Exam reveals no gallop and no friction rub.   No murmur heard. Pulmonary/Chest: Effort normal and breath sounds normal. No respiratory distress. She has no wheezes. She has no rales. She exhibits no tenderness.  Musculoskeletal: Normal range of motion.  Neurological: She is alert and oriented to person, place, and time.  Skin: Skin is warm, dry and intact. No rash noted. No erythema. No pallor.  Psychiatric: She has a normal mood and affect. Her speech is normal and behavior is normal. Judgment and thought content normal. Cognition and memory are normal.  Nursing note and vitals reviewed.   Results for orders placed or performed in visit on 01/16/17  WET PREP FOR [place], YEAST, CLUE  Result Value Ref Range   Trichomonas Exam Negative Negative   Yeast Exam Negative Negative   Clue Cell Exam Positive (A) Negative  Bayer DCA Hb A1c Waived  Result Value Ref Range   Bayer DCA Hb A1c Waived 6.1 <7.0 %  CBC with Differential/Platelet  Result Value Ref Range   WBC 8.7 3.4 - 10.8 x10E3/uL   RBC 4.25 3.77 - 5.28 x10E6/uL   Hemoglobin 12.9 11.1 - 15.9 g/dL   Hematocrit 39.1 34.0 - 46.6 %   MCV 92 79 - 97 fL   MCH 30.4 26.6 - 33.0 pg   MCHC 33.0 31.5 - 35.7 g/dL   RDW 13.7 12.3 - 15.4 %   Platelets  226 150 - 379 x10E3/uL   Neutrophils 61 Not Estab. %   Lymphs 32 Not Estab. %   Monocytes 5 Not Estab. %   Eos 2 Not Estab. %   Basos 0 Not Estab. %   Neutrophils Absolute 5.2 1.4 - 7.0 x10E3/uL   Lymphocytes Absolute 2.8 0.7 - 3.1 x10E3/uL   Monocytes Absolute 0.5 0.1 - 0.9 x10E3/uL   EOS (ABSOLUTE) 0.2 0.0 - 0.4 x10E3/uL   Basophils Absolute 0.0 0.0 - 0.2 x10E3/uL   Immature Granulocytes 0 Not Estab. %   Immature Grans (Abs) 0.0 0.0 - 0.1 x10E3/uL  Comprehensive metabolic panel  Result Value Ref Range   Glucose 49 (L) 65 - 99 mg/dL   BUN 11 6 - 20 mg/dL    Creatinine, Ser 0.61 0.57 - 1.00 mg/dL   GFR calc non Af Amer 115 >59 mL/min/1.73   GFR calc Af Amer 132 >59 mL/min/1.73   BUN/Creatinine Ratio 18 9 - 23   Sodium 138 134 - 144 mmol/L   Potassium 4.1 3.5 - 5.2 mmol/L   Chloride 100 96 - 106 mmol/L   CO2 25 18 - 29 mmol/L   Calcium 9.5 8.7 - 10.2 mg/dL   Total Protein 6.6 6.0 - 8.5 g/dL   Albumin 4.2 3.5 - 5.5 g/dL   Globulin, Total 2.4 1.5 - 4.5 g/dL   Albumin/Globulin Ratio 1.8 1.2 - 2.2   Bilirubin Total 0.4 0.0 - 1.2 mg/dL   Alkaline Phosphatase 80 39 - 117 IU/L   AST 25 0 - 40 IU/L   ALT 31 0 - 32 IU/L  Lipid Panel w/o Chol/HDL Ratio  Result Value Ref Range   Cholesterol, Total 209 (H) 100 - 199 mg/dL   Triglycerides 334 (H) 0 - 149 mg/dL   HDL 40 >39 mg/dL   VLDL Cholesterol Cal 67 (H) 5 - 40 mg/dL   LDL Calculated 102 (H) 0 - 99 mg/dL  Microalbumin, Urine Waived  Result Value Ref Range   Microalb, Ur Waived 10 0 - 19 mg/L   Creatinine, Urine Waived 10 10 - 300 mg/dL   Microalb/Creat Ratio <30 <30 mg/g  TSH  Result Value Ref Range   TSH 1.890 0.450 - 4.500 uIU/mL  UA/M w/rflx Culture, Routine  Result Value Ref Range   Specific Gravity, UA 1.010 1.005 - 1.030   pH, UA 7.0 5.0 - 7.5   Color, UA Yellow Yellow   Appearance Ur Clear Clear   Leukocytes, UA Negative Negative   Protein, UA Negative Negative/Trace   Glucose, UA Negative Negative   Ketones, UA Negative Negative   RBC, UA Negative Negative   Bilirubin, UA Negative Negative   Urobilinogen, Ur 0.2 0.2 - 1.0 mg/dL   Nitrite, UA Negative Negative  VITAMIN D 25 Hydroxy (Vit-D Deficiency, Fractures)  Result Value Ref Range   Vit D, 25-Hydroxy 20.7 (L) 30.0 - 100.0 ng/mL      Assessment & Plan:   Problem List Items Addressed This Visit    None    Visit Diagnoses    Vaginal discharge    -  Primary   Negative wet prep. Continue to monitor. Call with any concerns.    Relevant Orders   WET PREP FOR [place], YEAST, CLUE       Follow up plan: Return if  symptoms worsen or fail to improve.

## 2017-03-07 LAB — WET PREP FOR TRICH, YEAST, CLUE
CLUE CELL EXAM: NEGATIVE
Trichomonas Exam: NEGATIVE
Yeast Exam: NEGATIVE

## 2017-05-01 ENCOUNTER — Encounter: Payer: Self-pay | Admitting: Family Medicine

## 2017-05-04 ENCOUNTER — Encounter: Payer: Self-pay | Admitting: Family Medicine

## 2017-05-04 ENCOUNTER — Ambulatory Visit (INDEPENDENT_AMBULATORY_CARE_PROVIDER_SITE_OTHER): Payer: 59 | Admitting: Family Medicine

## 2017-05-04 VITALS — BP 122/80 | HR 68 | Temp 98.4°F | Ht 60.0 in | Wt 157.7 lb

## 2017-05-04 DIAGNOSIS — F332 Major depressive disorder, recurrent severe without psychotic features: Secondary | ICD-10-CM

## 2017-05-04 DIAGNOSIS — F419 Anxiety disorder, unspecified: Secondary | ICD-10-CM

## 2017-05-04 MED ORDER — BUPROPION HCL ER (SR) 150 MG PO TB12: 150.0000 mg | ORAL_TABLET | Freq: Two times a day (BID) | ORAL | 2 refills | Status: DC

## 2017-05-04 MED ORDER — BUSPIRONE HCL 5 MG PO TABS: 5.0000 mg | ORAL_TABLET | Freq: Three times a day (TID) | ORAL | 1 refills | Status: DC

## 2017-05-04 NOTE — Progress Notes (Signed)
BP 122/80 (BP Location: Left Arm, Patient Position: Sitting, Cuff Size: Normal)   Pulse 68   Temp 98.4 F (36.9 C)   Ht 5' (1.524 m)   Wt 157 lb 11.2 oz (71.5 kg)   SpO2 100%   BMI 30.80 kg/m    Subjective:    Patient ID: Ashley Wiley, female    DOB: 1976/05/31, 41 y.o.   MRN: [id number]  HPI: Ashley Wiley is a 41 y.o. female  Chief Complaint  Patient presents with  . Anxiety    Filling out GAD7 & PHQ9  . Depression   ANXIETY/DEPRESSION Duration:exacerbated Anxious mood: yes  Excessive worrying: yes Irritability: yes  Sweating: no Nausea: yes Palpitations:yes Hyperventilation: no Panic attacks: no Agoraphobia: no  Obscessions/compulsions: no Depressed mood: yes Depression screen [facility] 2/9 05/04/2017 01/16/2017 02/15/2016 01/13/2016  Decreased Interest 2 0 0 0  Down, Depressed, Hopeless 1 0 0 0  PHQ - 2 Score 3 0 0 0  Altered sleeping 2 - - -  Tired, decreased energy 2 - - -  Change in appetite 2 - - -  Feeling bad or failure about yourself  2 - - -  Trouble concentrating 2 - - -  Moving slowly or fidgety/restless 1 - - -  Suicidal thoughts 0 - - -  PHQ-9 Score 14 - - -   GAD 7 : Generalized Anxiety Score 05/04/2017 05/19/2016 02/15/2016 01/13/2016  Nervous, Anxious, on Edge 3 2 1 2   Control/stop worrying 3 1 1 1   Worry too much - different things 3 1 1 3   Trouble relaxing 3 1 0 3  Restless 2 1 0 3  Easily annoyed or irritable 3 1 1 3   Afraid - awful might happen 3 1 1 2   Total GAD 7 Score 20 8 5 17   Anxiety Difficulty Somewhat difficult Somewhat difficult Somewhat difficult Somewhat difficult   Anhedonia: no Weight changes: no Insomnia: no   Hypersomnia: no Fatigue/loss of energy: yes Feelings of worthlessness: yes Feelings of guilt: yes Impaired concentration/indecisiveness: yes Suicidal ideations: no  Crying spells: yes Recent Stressors/Life Changes: yes   Relationship problems: yes   Family stress: yes     Financial stress: no    Job  stress: no    Recent death/loss: no   Relevant past medical, surgical, family and social history reviewed and updated as indicated. Interim medical history since our last visit reviewed. Allergies and medications reviewed and updated.  Review of Systems  Constitutional: Negative.   Respiratory: Negative.   Cardiovascular: Negative.   Psychiatric/Behavioral: Positive for agitation, decreased concentration and dysphoric mood. Negative for behavioral problems, confusion, hallucinations, self-injury, sleep disturbance and suicidal ideas. The patient is nervous/anxious. The patient is not hyperactive.     Per HPI unless specifically indicated above     Objective:    BP 122/80 (BP Location: Left Arm, Patient Position: Sitting, Cuff Size: Normal)   Pulse 68   Temp 98.4 F (36.9 C)   Ht 5' (1.524 m)   Wt 157 lb 11.2 oz (71.5 kg)   SpO2 100%   BMI 30.80 kg/m   Wt Readings from Last 3 Encounters:  05/04/17 157 lb 11.2 oz (71.5 kg)  03/06/17 153 lb 1.6 oz (69.4 kg)  01/16/17 155 lb (70.3 kg)    Physical Exam  Constitutional: She is oriented to person, place, and time. She appears well-developed and well-nourished. No distress.  HENT:  Head: Normocephalic and atraumatic.  Right Ear: Hearing normal.  Left Ear:  Hearing normal.  Nose: Nose normal.  Eyes: Conjunctivae and lids are normal. Right eye exhibits no discharge. Left eye exhibits no discharge. No scleral icterus.  Cardiovascular: Normal rate, regular rhythm, normal heart sounds and intact distal pulses.  Exam reveals no gallop and no friction rub.   No murmur heard. Pulmonary/Chest: Effort normal and breath sounds normal. No respiratory distress. She has no wheezes. She has no rales. She exhibits no tenderness.  Musculoskeletal: Normal range of motion.  Neurological: She is alert and oriented to person, place, and time.  Skin: Skin is warm, dry and intact. No rash noted. She is not diaphoretic. No erythema. No pallor.    Psychiatric: She has a normal mood and affect. Her speech is normal and behavior is normal. Judgment and thought content normal. Cognition and memory are normal.  Nursing note and vitals reviewed.   Results for orders placed or performed in visit on 03/06/17  WET PREP FOR [place], YEAST, CLUE  Result Value Ref Range   Trichomonas Exam Negative Negative   Yeast Exam Negative Negative   Clue Cell Exam Negative Negative      Assessment & Plan:   Problem List Items Addressed This Visit      Other   Depression - Primary    Not under good control. Will add wellbutrin and buspar and recheck 1 month. Call with any concerns.       Relevant Medications   buPROPion (WELLBUTRIN SR) 150 MG 12 hr tablet   busPIRone (BUSPAR) 5 MG tablet   Acute anxiety    Not under good control. Will add wellbutrin and buspar and recheck 1 month. Call with any concerns.       Relevant Medications   buPROPion (WELLBUTRIN SR) 150 MG 12 hr tablet   busPIRone (BUSPAR) 5 MG tablet       Follow up plan: Return in about 4 weeks (around 06/01/2017) for Follow up mood.

## 2017-05-04 NOTE — Assessment & Plan Note (Signed)
Not under good control. Will add wellbutrin and buspar and recheck 1 month. Call with any concerns.

## 2017-05-11 ENCOUNTER — Encounter: Payer: Self-pay | Admitting: Family Medicine

## 2017-05-11 ENCOUNTER — Ambulatory Visit (INDEPENDENT_AMBULATORY_CARE_PROVIDER_SITE_OTHER): Payer: 59 | Admitting: Family Medicine

## 2017-05-11 VITALS — BP 137/89 | HR 89 | Temp 98.6°F | Wt 154.1 lb

## 2017-05-11 DIAGNOSIS — F419 Anxiety disorder, unspecified: Secondary | ICD-10-CM | POA: Diagnosis not present

## 2017-05-11 DIAGNOSIS — F332 Major depressive disorder, recurrent severe without psychotic features: Secondary | ICD-10-CM

## 2017-05-11 DIAGNOSIS — R251 Tremor, unspecified: Secondary | ICD-10-CM | POA: Diagnosis not present

## 2017-05-11 NOTE — Progress Notes (Signed)
BP 137/89 (BP Location: Left Arm, Patient Position: Sitting, Cuff Size: Normal)   Pulse 89   Temp 98.6 F (37 C)   Wt 154 lb 1 oz (69.9 kg)   LMP 04/30/2017 (Approximate)   SpO2 100%   BMI 30.09 kg/m    Subjective:    Patient ID: Ashley Wiley, female    DOB: 1976/07/06, 41 y.o.   MRN: [id number]  HPI: Ashley Wiley is a 41 y.o. female  Chief Complaint  Patient presents with  . Anxiety  . Tremors    Left more pronounced than right   ANXIETY/STRESS- has been having anxiety attacks. She notes that she's fine in the morning, but as the day goes on she feels more anxious. Has been feeling much worse. Buspar helps in the AM, but not in the PM. Has also been having some fine tremors of both hands Duration:exacerbated Anxious mood: yes  Excessive worrying: yes Irritability: yes  Sweating: no Nausea: no Palpitations:no Hyperventilation: no Panic attacks: no Agoraphobia: no  Obscessions/compulsions: no Depressed mood: yes Depression screen [facility] 2/9 05/11/2017 05/04/2017 01/16/2017 02/15/2016 01/13/2016  Decreased Interest 2 2 0 0 0  Down, Depressed, Hopeless 2 1 0 0 0  PHQ - 2 Score 4 3 0 0 0  Altered sleeping 3 2 - - -  Tired, decreased energy 2 2 - - -  Change in appetite 3 2 - - -  Feeling bad or failure about yourself  1 2 - - -  Trouble concentrating 2 2 - - -  Moving slowly or fidgety/restless 2 1 - - -  Suicidal thoughts 0 0 - - -  PHQ-9 Score 17 14 - - -   GAD 7 : Generalized Anxiety Score 05/11/2017 05/04/2017 05/19/2016 02/15/2016  Nervous, Anxious, on Edge 3 3 2 1   Control/stop worrying 3 3 1 1   Worry too much - different things 3 3 1 1   Trouble relaxing 3 3 1  0  Restless 3 2 1  0  Easily annoyed or irritable 3 3 1 1   Afraid - awful might happen 3 3 1 1   Total GAD 7 Score 21 20 8 5   Anxiety Difficulty Somewhat difficult Somewhat difficult Somewhat difficult Somewhat difficult   Anhedonia: no Weight changes: yes Insomnia: no   Hypersomnia:  no Fatigue/loss of energy: yes Feelings of worthlessness: yes Feelings of guilt: yes Impaired concentration/indecisiveness: no Suicidal ideations: no  Crying spells: yes Recent Stressors/Life Changes: yes   Relationship problems: yes   Family stress: yes     Financial stress: no    Job stress: no    Recent death/loss: no  Relevant past medical, surgical, family and social history reviewed and updated as indicated. Interim medical history since our last visit reviewed. Allergies and medications reviewed and updated.  Review of Systems  Constitutional: Negative.   Respiratory: Negative.   Cardiovascular: Negative.   Psychiatric/Behavioral: Positive for agitation, dysphoric mood and sleep disturbance. Negative for behavioral problems, confusion, decreased concentration, hallucinations, self-injury and suicidal ideas. The patient is nervous/anxious. The patient is not hyperactive.     Per HPI unless specifically indicated above     Objective:    BP 137/89 (BP Location: Left Arm, Patient Position: Sitting, Cuff Size: Normal)   Pulse 89   Temp 98.6 F (37 C)   Wt 154 lb 1 oz (69.9 kg)   LMP 04/30/2017 (Approximate)   SpO2 100%   BMI 30.09 kg/m   Wt Readings from Last 3 Encounters:  05/11/17  154 lb 1 oz (69.9 kg)  05/04/17 157 lb 11.2 oz (71.5 kg)  03/06/17 153 lb 1.6 oz (69.4 kg)    Physical Exam  Constitutional: She is oriented to person, place, and time. She appears well-developed and well-nourished. No distress.  HENT:  Head: Normocephalic and atraumatic.  Right Ear: Hearing normal.  Left Ear: Hearing normal.  Nose: Nose normal.  Eyes: Conjunctivae and lids are normal. Right eye exhibits no discharge. Left eye exhibits no discharge. No scleral icterus.  Cardiovascular: Normal rate, regular rhythm, normal heart sounds and intact distal pulses.  Exam reveals no gallop and no friction rub.   No murmur heard. Pulmonary/Chest: Effort normal and breath sounds normal. No  respiratory distress. She has no wheezes. She has no rales. She exhibits no tenderness.  Musculoskeletal: Normal range of motion.  Neurological: She is alert and oriented to person, place, and time.  Skin: Skin is warm, dry and intact. No rash noted. No erythema. No pallor.  Psychiatric: Her speech is normal and behavior is normal. Judgment and thought content normal. Her mood appears anxious. Cognition and memory are normal. She exhibits a depressed mood.  Nursing note and vitals reviewed.   Results for orders placed or performed in visit on 03/06/17  WET PREP FOR [place], YEAST, CLUE  Result Value Ref Range   Trichomonas Exam Negative Negative   Yeast Exam Negative Negative   Clue Cell Exam Negative Negative      Assessment & Plan:   Problem List Items Addressed This Visit      Other   Depression - Primary    Worse on the wellbutrin. Will stop it and see how she does next week. Call with any concerns.       Acute anxiety    Worse on the wellbutrin. Will stop it and see how she does next week. Call with any concerns.        Other Visit Diagnoses    Tremor       Likely from wellbutrin. Will stop wellbutrin and recheck in 20month.        Follow up plan: Return in about 1 week (around 05/18/2017) for Follow up mood.

## 2017-05-11 NOTE — Patient Instructions (Signed)
Take 1 tab Wellbutrin QAM for 3 days, then stop.

## 2017-05-11 NOTE — Assessment & Plan Note (Signed)
Worse on the wellbutrin. Will stop it and see how she does next week. Call with any concerns.

## 2017-05-18 ENCOUNTER — Ambulatory Visit: Payer: 59 | Admitting: Family Medicine

## 2017-05-31 ENCOUNTER — Encounter: Payer: 59 | Admitting: Obstetrics and Gynecology

## 2017-06-04 ENCOUNTER — Encounter: Payer: Self-pay | Admitting: Physician Assistant

## 2017-06-04 ENCOUNTER — Ambulatory Visit: Payer: Self-pay | Admitting: Physician Assistant

## 2017-06-04 VITALS — BP 142/82 | HR 95 | Temp 98.2°F | Resp 16

## 2017-06-04 DIAGNOSIS — J0100 Acute maxillary sinusitis, unspecified: Secondary | ICD-10-CM

## 2017-06-04 MED ORDER — AMOXICILLIN-POT CLAVULANATE 875-125 MG PO TABS: 1.0000 | ORAL_TABLET | Freq: Two times a day (BID) | ORAL | 0 refills | Status: DC

## 2017-06-04 MED ORDER — FLUCONAZOLE 150 MG PO TABS: ORAL_TABLET | ORAL | 0 refills | Status: DC

## 2017-06-04 NOTE — Progress Notes (Signed)
S: C/o sinus pain, runny nose and congestion for 5 days, no fever, chills, cp/sob, v/d; mucus is green and thick, cough is sporadic, c/o of facial and dental pain. Also using her son's eye drops for pink eye  Using otc meds:   O: PE: vitals wnl, nad, perrl eomi, conjunctiva is a little pink, no drainage, normocephalic, tms dull, nasal mucosa red and swollen, throat injected, neck supple no lymph, lungs c t a, cv rrr, neuro intact  A:  Acute sinusitis, conjunctivitis   P: drink fluids, continue regular meds , use otc meds of choice, return if not improving in 5 days, return earlier if worsening , augmentin, diflucan, continue eye drops

## 2017-06-05 ENCOUNTER — Ambulatory Visit (INDEPENDENT_AMBULATORY_CARE_PROVIDER_SITE_OTHER): Payer: 59 | Admitting: Family Medicine

## 2017-06-05 ENCOUNTER — Encounter: Payer: Self-pay | Admitting: Family Medicine

## 2017-06-05 VITALS — BP 129/86 | HR 82 | Temp 98.8°F | Wt 156.4 lb

## 2017-06-05 DIAGNOSIS — J0101 Acute recurrent maxillary sinusitis: Secondary | ICD-10-CM

## 2017-06-05 DIAGNOSIS — F332 Major depressive disorder, recurrent severe without psychotic features: Secondary | ICD-10-CM | POA: Diagnosis not present

## 2017-06-05 NOTE — Progress Notes (Signed)
BP 129/86 (BP Location: Left Arm, Patient Position: Sitting, Cuff Size: Normal)   Pulse 82   Temp 98.8 F (37.1 C)   Wt 156 lb 7 oz (71 kg)   LMP 05/22/2017 (Approximate)   SpO2 99%   BMI 30.55 kg/m    Subjective:    Patient ID: Ashley Wiley, female    DOB: Mar 20, 1976, 41 y.o.   MRN: [id number]  HPI: Ashley Wiley is a 41 y.o. female  Chief Complaint  Patient presents with  . Depression   DEPRESSION- feeling about 75% better. She states that she's not quite there, but doing much better. Stopped the wellbutrin because of side effects. She likes the buspar. She notes that she is only taking it about 2-3x a week. She is doing well with it.  Mood status: better Satisfied with current treatment?: yes Symptom severity: moderate  Duration of current treatment : months Side effects: no Medication compliance: excellent compliance Psychotherapy/counseling: no  Previous psychiatric medications: fluoxetine, wellbutrin, buspar Depressed mood: yes Anxious mood: yes Anhedonia: no Significant weight loss or gain: no Insomnia: no  Fatigue: no Feelings of worthlessness or guilt: no Impaired concentration/indecisiveness: no Suicidal ideations: no Hopelessness: no Crying spells: no Depression screen [facility] 2/9 06/05/2017 05/11/2017 05/04/2017 01/16/2017 02/15/2016  Decreased Interest 1 2 2  0 0  Down, Depressed, Hopeless 1 2 1  0 0  PHQ - 2 Score 2 4 3  0 0  Altered sleeping 2 3 2  - -  Tired, decreased energy 2 2 2  - -  Change in appetite 2 3 2  - -  Feeling bad or failure about yourself  2 1 2  - -  Trouble concentrating 2 2 2  - -  Moving slowly or fidgety/restless 2 2 1  - -  Suicidal thoughts 0 0 0 - -  PHQ-9 Score 14 17 14  - -     Relevant past medical, surgical, family and social history reviewed and updated as indicated. Interim medical history since our last visit reviewed. Allergies and medications reviewed and updated.  Review of Systems  Constitutional: Negative.     Respiratory: Negative.   Psychiatric/Behavioral: Negative.     Per HPI unless specifically indicated above     Objective:    BP 129/86 (BP Location: Left Arm, Patient Position: Sitting, Cuff Size: Normal)   Pulse 82   Temp 98.8 F (37.1 C)   Wt 156 lb 7 oz (71 kg)   LMP 05/22/2017 (Approximate)   SpO2 99%   BMI 30.55 kg/m   Wt Readings from Last 3 Encounters:  06/05/17 156 lb 7 oz (71 kg)  05/11/17 154 lb 1 oz (69.9 kg)  05/04/17 157 lb 11.2 oz (71.5 kg)    Physical Exam  Constitutional: She is oriented to person, place, and time. She appears well-developed and well-nourished. No distress.  HENT:  Head: Normocephalic and atraumatic.  Right Ear: Hearing normal.  Left Ear: Hearing normal.  Nose: Nose normal.  Eyes: Conjunctivae and lids are normal. Right eye exhibits no discharge. Left eye exhibits no discharge. No scleral icterus.  Cardiovascular: Normal rate, regular rhythm, normal heart sounds and intact distal pulses.  Exam reveals no gallop and no friction rub.   No murmur heard. Pulmonary/Chest: Effort normal and breath sounds normal. No respiratory distress. She has no wheezes. She has no rales. She exhibits no tenderness.  Musculoskeletal: Normal range of motion.  Neurological: She is alert and oriented to person, place, and time.  Skin: Skin is warm, dry and intact.  No rash noted. She is not diaphoretic. No erythema. No pallor.  Psychiatric: She has a normal mood and affect. Her speech is normal and behavior is normal. Judgment and thought content normal. Cognition and memory are normal.  Nursing note and vitals reviewed.   Results for orders placed or performed in visit on 03/06/17  WET PREP FOR [place], YEAST, CLUE  Result Value Ref Range   Trichomonas Exam Negative Negative   Yeast Exam Negative Negative   Clue Cell Exam Negative Negative      Assessment & Plan:   Problem List Items Addressed This Visit      Other   Depression - Primary    Doing much  better, even if PHQ9 does not show that. Does not want to change current regimen. Continue to monitor call with any concerns.        Other Visit Diagnoses    Acute recurrent maxillary sinusitis       Improving. Continue to monitor. Call if not getting better and will extend antibiotics to 14 days.       Follow up plan: Return End of August/Beginning September, for Follow up IFG/cholesterol/mood.

## 2017-06-05 NOTE — Patient Instructions (Signed)
[place] [facility] [place]

## 2017-06-05 NOTE — Assessment & Plan Note (Signed)
Doing much better, even if PHQ9 does not show that. Does not want to change current regimen. Continue to monitor call with any concerns.

## 2017-06-27 ENCOUNTER — Telehealth: Payer: Self-pay | Admitting: Family Medicine

## 2017-06-27 ENCOUNTER — Encounter: Payer: Self-pay | Admitting: Family Medicine

## 2017-06-27 MED ORDER — AMOXICILLIN-POT CLAVULANATE 875-125 MG PO TABS: 1.0000 | ORAL_TABLET | Freq: Two times a day (BID) | ORAL | 0 refills | Status: DC

## 2017-06-27 NOTE — Telephone Encounter (Signed)
Rx sent to her pharmacy- if she is not getting better, she should go back to her ENT

## 2017-06-27 NOTE — Telephone Encounter (Signed)
Patient notified

## 2017-06-27 NOTE — Telephone Encounter (Signed)
Routing to provider  

## 2017-06-27 NOTE — Telephone Encounter (Signed)
Patients sinus infection has not cleared up. She would like Dr Wynetta Emery to send in antibiotic to continue treating the infection.  She is having increase, sinus pain, and pressure and coughing up green mucus.  Please advise.  Thanks

## 2017-07-17 ENCOUNTER — Encounter: Payer: Self-pay | Admitting: Family Medicine

## 2017-07-17 ENCOUNTER — Ambulatory Visit (INDEPENDENT_AMBULATORY_CARE_PROVIDER_SITE_OTHER): Payer: 59 | Admitting: Family Medicine

## 2017-07-17 VITALS — BP 127/82 | HR 69 | Temp 98.5°F | Wt 159.4 lb

## 2017-07-17 DIAGNOSIS — F332 Major depressive disorder, recurrent severe without psychotic features: Secondary | ICD-10-CM | POA: Diagnosis not present

## 2017-07-17 DIAGNOSIS — R7301 Impaired fasting glucose: Secondary | ICD-10-CM

## 2017-07-17 DIAGNOSIS — J0101 Acute recurrent maxillary sinusitis: Secondary | ICD-10-CM | POA: Diagnosis not present

## 2017-07-17 DIAGNOSIS — E782 Mixed hyperlipidemia: Secondary | ICD-10-CM | POA: Diagnosis not present

## 2017-07-17 MED ORDER — METFORMIN HCL ER 500 MG PO TB24: 1000.0000 mg | ORAL_TABLET | Freq: Every evening | ORAL | 1 refills | Status: DC

## 2017-07-17 MED ORDER — BUSPIRONE HCL 5 MG PO TABS: 5.0000 mg | ORAL_TABLET | Freq: Three times a day (TID) | ORAL | 1 refills | Status: DC

## 2017-07-17 MED ORDER — FLUOXETINE HCL 20 MG PO CAPS: 60.0000 mg | ORAL_CAPSULE | Freq: Every day | ORAL | 1 refills | Status: DC

## 2017-07-17 MED ORDER — DOXYCYCLINE HYCLATE 100 MG PO TABS: 100.0000 mg | ORAL_TABLET | Freq: Two times a day (BID) | ORAL | 0 refills | Status: DC

## 2017-07-17 NOTE — Assessment & Plan Note (Signed)
Rechecking levels today. Await results. Continue diet and exercise. Call with any concerns.

## 2017-07-17 NOTE — Progress Notes (Signed)
BP 127/82 (BP Location: Left Arm, Patient Position: Sitting, Cuff Size: Normal)   Pulse 69   Temp 98.5 F (36.9 C)   Wt 159 lb 6 oz (72.3 kg)   SpO2 99%   BMI 31.13 kg/m    Subjective:    Patient ID: Ashley Wiley, female    DOB: 10/13/1976, 41 y.o.   MRN: [id number]  HPI: SHAMYRA FARIAS is a 41 y.o. female  Chief Complaint  Patient presents with  . IFG  . Depression  . Hyperlipidemia   Feels like the sinusitis never went away. Still having a lot pain in the R sinuses.   Impaired Fasting Glucose HbA1C:  Lab Results  Component Value Date   HGBA1C 6.1 (H) 01/13/2016   Duration of elevated blood sugar: chronic Polydipsia: no Polyuria: no Weight change: no Visual disturbance: no Glucose Monitoring: yes    Accucheck frequency: Daily    Fasting glucose: 109 Diabetic Education: Completed Family history of diabetes: yes  HYPERLIPIDEMIA Hyperlipidemia status: excellent compliance Satisfied with current treatment?  yes Side effects:  no Medication compliance: excellent compliance Past cholesterol meds: none Supplements: none Aspirin:  no Chest pain:  no Coronary artery disease:  no Family history CAD:  yes  DEPRESSION Mood status: controlled Satisfied with current treatment?: yes Symptom severity: mild  Duration of current treatment : chronic Side effects: no Medication compliance: excellent compliance Psychotherapy/counseling: no  Depressed mood: no Anxious mood: no Anhedonia: no Significant weight loss or gain: no Insomnia: no  Fatigue: yes Feelings of worthlessness or guilt: no Impaired concentration/indecisiveness: no Suicidal ideations: no Hopelessness: no Crying spells: no Depression screen [facility] 2/9 07/17/2017 06/05/2017 05/11/2017 05/04/2017 01/16/2017  Decreased Interest 1 1 2 2  0  Down, Depressed, Hopeless 0 1 2 1  0  PHQ - 2 Score 1 2 4 3  0  Altered sleeping 0 2 3 2  -  Tired, decreased energy 1 2 2 2  -  Change in appetite 1 2 3 2  -    Feeling bad or failure about yourself  0 2 1 2  -  Trouble concentrating 0 2 2 2  -  Moving slowly or fidgety/restless 0 2 2 1  -  Suicidal thoughts 0 0 0 0 -  PHQ-9 Score 3 14 17 14  -     Relevant past medical, surgical, family and social history reviewed and updated as indicated. Interim medical history since our last visit reviewed. Allergies and medications reviewed and updated.  Review of Systems  Constitutional: Negative.   HENT: Positive for congestion, facial swelling, postnasal drip, rhinorrhea, sinus pain, sinus pressure and sneezing. Negative for dental problem, drooling, ear discharge, ear pain, hearing loss, mouth sores, nosebleeds, sore throat, tinnitus, trouble swallowing and voice change.   Eyes: Negative.   Respiratory: Negative.   Cardiovascular: Negative.   Psychiatric/Behavioral: Negative.     Per HPI unless specifically indicated above     Objective:    BP 127/82 (BP Location: Left Arm, Patient Position: Sitting, Cuff Size: Normal)   Pulse 69   Temp 98.5 F (36.9 C)   Wt 159 lb 6 oz (72.3 kg)   SpO2 99%   BMI 31.13 kg/m   Wt Readings from Last 3 Encounters:  07/17/17 159 lb 6 oz (72.3 kg)  06/05/17 156 lb 7 oz (71 kg)  05/11/17 154 lb 1 oz (69.9 kg)    Physical Exam  Constitutional: She is oriented to person, place, and time. She appears well-developed and well-nourished. No distress.  HENT:  Head:  Normocephalic and atraumatic.  Right Ear: Hearing, tympanic membrane, external ear and ear canal normal.  Left Ear: Hearing, tympanic membrane, external ear and ear canal normal.  Nose: Right sinus exhibits maxillary sinus tenderness. Right sinus exhibits no frontal sinus tenderness. Left sinus exhibits maxillary sinus tenderness. Left sinus exhibits no frontal sinus tenderness.  Mouth/Throat: Uvula is midline, oropharynx is clear and moist and mucous membranes are normal. No oropharyngeal exudate.  Eyes: Pupils are equal, round, and reactive to light.  Conjunctivae, EOM and lids are normal. Right eye exhibits no discharge. Left eye exhibits no discharge. No scleral icterus.  Neck: Normal range of motion. Neck supple. No JVD present. No tracheal deviation present. No thyromegaly present.  Cardiovascular: Normal rate, regular rhythm, normal heart sounds and intact distal pulses.  Exam reveals no gallop and no friction rub.   No murmur heard. Pulmonary/Chest: Effort normal and breath sounds normal. No stridor. No respiratory distress. She has no wheezes. She has no rales. She exhibits no tenderness.  Musculoskeletal: Normal range of motion.  Lymphadenopathy:    She has no cervical adenopathy.  Neurological: She is alert and oriented to person, place, and time.  Skin: Skin is warm, dry and intact. No rash noted. She is not diaphoretic. No erythema. No pallor.  Psychiatric: She has a normal mood and affect. Her speech is normal and behavior is normal. Judgment and thought content normal. Cognition and memory are normal.  Nursing note and vitals reviewed.   Results for orders placed or performed in visit on 03/06/17  WET PREP FOR [place], YEAST, CLUE  Result Value Ref Range   Trichomonas Exam Negative Negative   Yeast Exam Negative Negative   Clue Cell Exam Negative Negative      Assessment & Plan:   Problem List Items Addressed This Visit      Endocrine   IFG (impaired fasting glucose) - Primary    Rechecking levels today. Await results. Continue diet and exercise. Call with any concerns.       Relevant Orders   Comprehensive metabolic panel   Hgb I5O w/o eAG     Other   Hyperlipidemia    Rechecking levels today. Await results. Continue to monitor.       Relevant Orders   Lipid Panel w/o Chol/HDL Ratio   Comprehensive metabolic panel   Depression    Doing much better. Stable. Continue current regimen. Call with any concerns.       Relevant Medications   FLUoxetine (PROZAC) 20 MG capsule   busPIRone (BUSPAR) 5 MG tablet    Other Relevant Orders   Comprehensive metabolic panel    Other Visit Diagnoses    Acute recurrent maxillary sinusitis       Will get her back into ENT- she will call. Will treat this with doxycycline. Call with any concerns.    Relevant Medications   doxycycline (VIBRA-TABS) 100 MG tablet       Follow up plan: No Follow-up on file.

## 2017-07-17 NOTE — Assessment & Plan Note (Signed)
Rechecking levels today. Await results. Continue to monitor.  

## 2017-07-17 NOTE — Assessment & Plan Note (Signed)
Doing much better. Stable. Continue current regimen. Call with any concerns.

## 2017-07-18 ENCOUNTER — Encounter: Payer: Self-pay | Admitting: Family Medicine

## 2017-07-18 LAB — COMPREHENSIVE METABOLIC PANEL
A/G RATIO: 1.7 (ref 1.2–2.2)
ALBUMIN: 4.3 g/dL (ref 3.5–5.5)
ALK PHOS: 82 IU/L (ref 39–117)
ALT: 27 IU/L (ref 0–32)
AST: 28 IU/L (ref 0–40)
BUN / CREAT RATIO: 24 — AB (ref 9–23)
BUN: 12 mg/dL (ref 6–20)
Bilirubin Total: 0.5 mg/dL (ref 0.0–1.2)
CO2: 26 mmol/L (ref 20–29)
CREATININE: 0.51 mg/dL — AB (ref 0.57–1.00)
Calcium: 9.7 mg/dL (ref 8.7–10.2)
Chloride: 96 mmol/L (ref 96–106)
GFR calc Af Amer: 140 mL/min/{1.73_m2} (ref >59)
GFR, EST NON AFRICAN AMERICAN: 122 mL/min/{1.73_m2} (ref >59)
GLOBULIN, TOTAL: 2.6 g/dL (ref 1.5–4.5)
Glucose: 84 mg/dL (ref 65–99)
POTASSIUM: 4.5 mmol/L (ref 3.5–5.2)
SODIUM: 136 mmol/L (ref 134–144)
Total Protein: 6.9 g/dL (ref 6.0–8.5)

## 2017-07-18 LAB — LIPID PANEL W/O CHOL/HDL RATIO
Cholesterol, Total: 193 mg/dL (ref 100–199)
HDL: 32 mg/dL — AB (ref >39)
Triglycerides: 537 mg/dL — ABNORMAL HIGH (ref 0–149)

## 2017-07-18 LAB — HGB A1C W/O EAG: HEMOGLOBIN A1C: 5.7 % — AB (ref 4.8–5.6)

## 2017-07-19 ENCOUNTER — Ambulatory Visit: Payer: 59 | Admitting: Family Medicine

## 2017-10-23 ENCOUNTER — Telehealth: Payer: Self-pay

## 2017-10-23 ENCOUNTER — Encounter: Payer: Self-pay | Admitting: Family Medicine

## 2017-10-23 ENCOUNTER — Ambulatory Visit (INDEPENDENT_AMBULATORY_CARE_PROVIDER_SITE_OTHER): Payer: 59 | Admitting: Family Medicine

## 2017-10-23 VITALS — BP 127/83 | HR 71 | Temp 98.3°F | Wt 156.1 lb

## 2017-10-23 DIAGNOSIS — G500 Trigeminal neuralgia: Secondary | ICD-10-CM | POA: Diagnosis not present

## 2017-10-23 MED ORDER — CYCLOBENZAPRINE HCL 10 MG PO TABS: 10.0000 mg | ORAL_TABLET | Freq: Every day | ORAL | 0 refills | Status: DC

## 2017-10-23 MED ORDER — FLUTICASONE PROPIONATE 50 MCG/ACT NA SUSP: 2.0000 | Freq: Every day | NASAL | 12 refills | Status: DC

## 2017-10-23 MED ORDER — NORTRIPTYLINE HCL 10 MG PO CAPS: 10.0000 mg | ORAL_CAPSULE | Freq: Every day | ORAL | 3 refills | Status: DC

## 2017-10-23 NOTE — Telephone Encounter (Signed)
Refill on Flonase 

## 2017-10-23 NOTE — Progress Notes (Signed)
BP 127/83 (BP Location: Left Arm, Patient Position: Sitting, Cuff Size: Normal)   Pulse 71   Temp 98.3 F (36.8 C)   Wt 156 lb 1 oz (70.8 kg)   SpO2 100%   BMI 30.48 kg/m    Subjective:    Patient ID: Ashley Wiley, female    DOB: 1976-10-31, 41 y.o.   MRN: [id number]  HPI: Ashley Wiley is a 41 y.o. female  Chief Complaint  Patient presents with  . Facial Pain    X 6 weeks   Has been having facial pain for about 6 weeks. Seems to be getting worse. No fevers. No drainage. No cold symptoms. She knows that she grinds and wears a brace, but is not sure if it's been helping. She is really uncomfortable and not feeling well. No other concerns or complaints at this time.   Relevant past medical, surgical, family and social history reviewed and updated as indicated. Interim medical history since our last visit reviewed. Allergies and medications reviewed and updated.  Review of Systems  Constitutional: Negative.   HENT: Positive for facial swelling. Negative for congestion, dental problem, drooling, ear discharge, ear pain, hearing loss, mouth sores, nosebleeds, postnasal drip, rhinorrhea, sinus pressure, sinus pain, sneezing, sore throat, tinnitus, trouble swallowing and voice change.   Eyes: Negative.   Respiratory: Negative.   Cardiovascular: Negative.   Psychiatric/Behavioral: Negative.     Per HPI unless specifically indicated above     Objective:    BP 127/83 (BP Location: Left Arm, Patient Position: Sitting, Cuff Size: Normal)   Pulse 71   Temp 98.3 F (36.8 C)   Wt 156 lb 1 oz (70.8 kg)   SpO2 100%   BMI 30.48 kg/m   Wt Readings from Last 3 Encounters:  10/23/17 156 lb 1 oz (70.8 kg)  07/17/17 159 lb 6 oz (72.3 kg)  06/05/17 156 lb 7 oz (71 kg)    Physical Exam  Constitutional: She is oriented to person, place, and time. She appears well-developed and well-nourished. No distress.  HENT:  Head: Normocephalic and atraumatic.  Right Ear: Hearing and  external ear normal.  Left Ear: Hearing and external ear normal.  Nose: Nose normal.  Mouth/Throat: Oropharynx is clear and moist. No oropharyngeal exudate.  + tinel's over facial nerve on the R, + click on TMJ on the R  Eyes: Conjunctivae, EOM and lids are normal. Pupils are equal, round, and reactive to light. Right eye exhibits no discharge. Left eye exhibits no discharge. No scleral icterus.  Neck: Normal range of motion. Neck supple. No JVD present. No tracheal deviation present. No thyromegaly present.  Cardiovascular: Normal rate, regular rhythm, normal heart sounds and intact distal pulses. Exam reveals no gallop and no friction rub.  No murmur heard. Pulmonary/Chest: Effort normal and breath sounds normal. No stridor. No respiratory distress. She has no wheezes. She has no rales. She exhibits no tenderness.  Musculoskeletal: Normal range of motion.  Lymphadenopathy:    She has no cervical adenopathy.  Neurological: She is alert and oriented to person, place, and time.  Skin: Skin is warm, dry and intact. No rash noted. She is not diaphoretic. No erythema. No pallor.  Psychiatric: She has a normal mood and affect. Her speech is normal and behavior is normal. Judgment and thought content normal. Cognition and memory are normal.    Results for orders placed or performed in visit on 07/17/17  Lipid Panel w/o Chol/HDL Ratio  Result Value Ref Range  Cholesterol, Total 193 100 - 199 mg/dL   Triglycerides 537 (H) 0 - 149 mg/dL   HDL 32 (L) >39 mg/dL   VLDL Cholesterol Cal Comment 5 - 40 mg/dL   LDL Calculated Comment 0 - 99 mg/dL  Comprehensive metabolic panel  Result Value Ref Range   Glucose 84 65 - 99 mg/dL   BUN 12 6 - 20 mg/dL   Creatinine, Ser 0.51 (L) 0.57 - 1.00 mg/dL   GFR calc non Af Amer 122 >59 mL/min/1.73   GFR calc Af Amer 140 >59 mL/min/1.73   BUN/Creatinine Ratio 24 (H) 9 - 23   Sodium 136 134 - 144 mmol/L   Potassium 4.5 3.5 - 5.2 mmol/L   Chloride 96 96 - 106  mmol/L   CO2 26 20 - 29 mmol/L   Calcium 9.7 8.7 - 10.2 mg/dL   Total Protein 6.9 6.0 - 8.5 g/dL   Albumin 4.3 3.5 - 5.5 g/dL   Globulin, Total 2.6 1.5 - 4.5 g/dL   Albumin/Globulin Ratio 1.7 1.2 - 2.2   Bilirubin Total 0.5 0.0 - 1.2 mg/dL   Alkaline Phosphatase 82 39 - 117 IU/L   AST 28 0 - 40 IU/L   ALT 27 0 - 32 IU/L  Hgb A1c w/o eAG  Result Value Ref Range   Hgb A1c MFr Bld 5.7 (H) 4.8 - 5.6 %      Assessment & Plan:   Problem List Items Addressed This Visit    None    Visit Diagnoses    Trigeminal neuralgia    -  Primary   Unclear if aggevated from TMJ or sinus issues. Will treat with flexeril, jaw exercises, nortriptyline and obtain CT scan to look for chronic sinusitis.    Relevant Medications   nortriptyline (PAMELOR) 10 MG capsule   cyclobenzaprine (FLEXERIL) 10 MG tablet   Other Relevant Orders   CT Maxillofacial W/Cm   Basic metabolic panel       Follow up plan: Return As scheduled.

## 2017-10-23 NOTE — Patient Instructions (Addendum)
Jaw Range of Motion Exercises Jaw range of motion exercises are exercises that help your jaw to move better. These exercises can help to prevent:  Difficulty opening your mouth.  Pain in your jaw while it is both open and closed.  What should I be careful of when doing jaw exercises? Make sure that you only do jaw exercises as directed by your health care provider. You should only move your jaw as far as it can go in each direction, if told to do so by your health care provider. Do not move your jaw into positions that cause you any pain. What exercises should I do?  Stick your jaw forward. Hold this position for 1-2 seconds. Allow your jaw to return to its normal position and rest it there for 1-2 seconds. Do this exercise 8 times.  Stand or sit in front of a mirror. Place your tongue on the roof of your mouth, just behind your top teeth. Slowly open and close your jaw, keeping your tongue on the roof of your mouth. While you open and close your mouth, try to keep your jaw from moving toward one side or the other. Repeat this 8 times.  Move your jaw right. Hold this position for 1-2 seconds. Allow your jaw to return to its normal position, and rest it there for 1-2 seconds. Do this exercise 8 times.  Move your jaw left. Hold this position for 1-2 seconds. Allow your jaw to return to its normal position, and rest it there for 1-2 seconds. Do this exercise 8 times.  Open your mouth as far as it is can comfortably go. Hold this position for 1-2 seconds. Then close your mouth and rest for 1-2 seconds. Do this exercise 8 times.  Move your jaw in a circular motion, starting toward the right (clockwise). Repeat this 8 times.  Move your jaw in a circular motion, starting toward the left (counterclockwise). Repeat this 8 times. Apply moist heat packs or ice packs to your jaw before or after performing your exercises as directed by your health care provider. What else can I do? Avoid the following,  if they cause jaw pain or they increase your jaw pain:  Chewing gum.  Clenching your jaw or teeth or keeping tension in your jaw muscles.  Leaning on your jaw, such as resting your jaw in your hand while leaning on a desk.  This information is not intended to replace advice given to you by your health care provider. Make sure you discuss any questions you have with your health care provider. Document Released: 10/19/2008 Document Revised: 04/13/2016 Document Reviewed: 10/07/2014 Elsevier Interactive Patient Education  2018 [employer].  Trigeminal Neuralgia Trigeminal neuralgia is a nerve disorder that causes attacks of severe facial pain. The attacks last from a few seconds to several minutes. They can happen for days, weeks, or months and then go away for months or years. Trigeminal neuralgia is also called tic douloureux. What are the causes? This condition is caused by damage to a nerve in the face that is called the trigeminal nerve. An attack can be triggered by:  Talking.  Chewing.  Putting on makeup.  Washing your face.  Shaving your face.  Brushing your teeth.  Touching your face.  What increases the risk? This condition is more likely to develop in:  Women.  People who are 40 years of age or older.  What are the signs or symptoms? The main symptom of this condition is pain in  the jaw, lips, eyes, nose, scalp, forehead, and face. The pain may be intense, stabbing, electric, or shock-like. How is this diagnosed? This condition is diagnosed with a physical exam. A CT scan or MRI may be done to rule out other conditions that can cause facial pain. How is this treated? This condition may be treated with:  Avoiding the things that trigger your attacks.  Pain medicine.  Surgery. This may be done in severe cases if other medical treatment does not provide relief.  Follow these instructions at home:  Take over-the-counter and prescription medicines only as told  by your health care provider.  If you wish to get pregnant, talk with your health care provider before you start trying to get pregnant.  Avoid the things that trigger your attacks. It may help to: ? Chew on the unaffected side of your mouth. ? Avoid touching your face. ? Avoid blasts of hot or cold air. Contact a health care provider if:  Your pain medicine is not helping.  You develop new, unexplained symptoms, such as: ? Double vision. ? Facial weakness. ? Changes in hearing or balance.  You become pregnant. Get help right away if:  Your pain is unbearable, and your pain medicine does not help. This information is not intended to replace advice given to you by your health care provider. Make sure you discuss any questions you have with your health care provider. Document Released: 11/03/2000 Document Revised: 07/09/2016 Document Reviewed: 03/01/2015 Elsevier Interactive Patient Education  [employer].

## 2017-10-24 LAB — BASIC METABOLIC PANEL
BUN/Creatinine Ratio: 24 — ABNORMAL HIGH (ref 9–23)
BUN: 14 mg/dL (ref 6–24)
CALCIUM: 9.4 mg/dL (ref 8.7–10.2)
CHLORIDE: 94 mmol/L — AB (ref 96–106)
CO2: 26 mmol/L (ref 20–29)
Creatinine, Ser: 0.58 mg/dL (ref 0.57–1.00)
GFR calc Af Amer: 133 mL/min/{1.73_m2} (ref >59)
GFR calc non Af Amer: 116 mL/min/{1.73_m2} (ref >59)
GLUCOSE: 85 mg/dL (ref 65–99)
POTASSIUM: 4.2 mmol/L (ref 3.5–5.2)
SODIUM: 137 mmol/L (ref 134–144)

## 2017-10-30 ENCOUNTER — Ambulatory Visit
Admission: RE | Admit: 2017-10-30 | Discharge: 2017-10-30 | Disposition: A | Discharge status: Home / Self Care | Payer: 59 | Source: Ambulatory Visit | Attending: Family Medicine | Admitting: Family Medicine

## 2017-10-30 DIAGNOSIS — G500 Trigeminal neuralgia: Secondary | ICD-10-CM | POA: Diagnosis present

## 2017-10-30 DIAGNOSIS — Z9889 Other specified postprocedural states: Secondary | ICD-10-CM | POA: Diagnosis not present

## 2017-10-30 DIAGNOSIS — R9089 Other abnormal findings on diagnostic imaging of central nervous system: Secondary | ICD-10-CM | POA: Diagnosis not present

## 2017-10-30 DIAGNOSIS — R51 Headache: Secondary | ICD-10-CM | POA: Diagnosis not present

## 2017-10-30 DIAGNOSIS — J321 Chronic frontal sinusitis: Secondary | ICD-10-CM | POA: Diagnosis not present

## 2017-10-30 HISTORY — DX: Type 2 diabetes mellitus without complications: E11.9

## 2017-10-30 MED ORDER — IOPAMIDOL (ISOVUE-300) INJECTION 61%
75.0000 mL | Freq: Once | INTRAVENOUS | Status: AC | PRN
  Administered 2017-10-30: 75 mL via INTRAVENOUS

## 2017-10-31 ENCOUNTER — Telehealth: Payer: Self-pay | Admitting: Family Medicine

## 2017-10-31 DIAGNOSIS — I671 Cerebral aneurysm, nonruptured: Secondary | ICD-10-CM

## 2017-10-31 DIAGNOSIS — R51 Headache: Secondary | ICD-10-CM

## 2017-10-31 DIAGNOSIS — R519 Headache, unspecified: Secondary | ICD-10-CM

## 2017-10-31 NOTE — Telephone Encounter (Signed)
Called patient with her results of her CT.  I will call her back tomorrow to discuss them.

## 2017-10-31 NOTE — Telephone Encounter (Signed)
  Results read back Forwarding high priority to [facility] IMPRESSION: 1. Possible small anterior communicating artery aneurysm. Recommend MRA follow-up. 2. Chronically opacified left frontal sinus or anterior ethmoid. No progressive expansion or dehiscence when compared to 2015. 3. Secretions in the right frontal sinus, an improvement from 2015 when this air cell is completely opacified. The right frontal ethmoidal recess is narrowed by remote medial blowout fracture of the right orbit. 4. Bilateral maxillary antrostomies, ethmoidectomies, and sphenoidotomies are widely patent.

## 2017-10-31 NOTE — Telephone Encounter (Signed)
Called and [facility] for her to call back. Will try her tomorrow to discuss results.

## 2017-11-01 NOTE — Telephone Encounter (Signed)
Called patient with results of her CT. Will get MRA to r/o Possible small anterior communicating artery aneurysm. She is aware. Order placed.

## 2017-11-01 NOTE — Telephone Encounter (Signed)
See other telephone encounter.

## 2017-11-05 ENCOUNTER — Encounter: Payer: Self-pay | Admitting: Family Medicine

## 2017-11-07 ENCOUNTER — Ambulatory Visit (HOSPITAL_COMMUNITY)
Admission: RE | Admit: 2017-11-07 | Discharge: 2017-11-07 | Disposition: A | Discharge status: Home / Self Care | Payer: 59 | Source: Ambulatory Visit | Attending: Family Medicine | Admitting: Family Medicine

## 2017-11-07 DIAGNOSIS — R51 Headache: Secondary | ICD-10-CM | POA: Insufficient documentation

## 2017-11-07 DIAGNOSIS — I671 Cerebral aneurysm, nonruptured: Secondary | ICD-10-CM | POA: Insufficient documentation

## 2017-11-07 DIAGNOSIS — R519 Headache, unspecified: Secondary | ICD-10-CM

## 2017-11-08 ENCOUNTER — Telehealth: Payer: Self-pay | Admitting: Family Medicine

## 2017-11-08 ENCOUNTER — Encounter: Payer: Self-pay | Admitting: Family Medicine

## 2017-11-08 DIAGNOSIS — I671 Cerebral aneurysm, nonruptured: Secondary | ICD-10-CM

## 2017-11-08 MED ORDER — NORTRIPTYLINE HCL 25 MG PO CAPS: 25.0000 mg | ORAL_CAPSULE | Freq: Every day | ORAL | 3 refills | Status: DC

## 2017-11-08 NOTE — Telephone Encounter (Signed)
Copied from [place] [id number]. Topic: Quick Communication - Other Results >> Nov 08, 2017  7:56 AM Synthia Innocent wrote: Requesting MRI results, patient is concerned about MRI and would like to know her results as soon as possible, due to patient leaving country by tomorrow. And would like to know if she can or can't leave.

## 2017-11-08 NOTE — Telephone Encounter (Signed)
Copied from [place] [id number]. Topic: Quick Communication - Other Results >> Nov 08, 2017  7:56 AM Synthia Innocent wrote: Requesting MRI results, patient is concerned about MRI and would like to know her results as soon as possible, due to patient leaving country by tomorrow. And would like to know if she can or can't leave.

## 2017-11-08 NOTE — Telephone Encounter (Signed)
Called patient with her results. Discussed anerysm. Is going to go on her trip and come back and see neurosurgery when she gets back. Appointment scheduled for 11/26/17 at [place] with [place] neurosurgery. Discussed keeping stress level low and warning signs for which to go to the ER. She is aware.   Still having a lot of issues with her trigeminal neuralgia. Will increase her nortriptyline to 25mg . Call with any concerns.

## 2017-11-09 ENCOUNTER — Ambulatory Visit: Payer: 59

## 2017-11-23 ENCOUNTER — Ambulatory Visit (INDEPENDENT_AMBULATORY_CARE_PROVIDER_SITE_OTHER): Payer: Self-pay | Admitting: Medical

## 2017-11-23 VITALS — BP 145/73 | HR 81 | Wt 155.0 lb

## 2017-11-23 DIAGNOSIS — N3001 Acute cystitis with hematuria: Secondary | ICD-10-CM

## 2017-11-23 LAB — POCT URINALYSIS DIPSTICK
BILIRUBIN UA: NEGATIVE
GLUCOSE UA: NEGATIVE
KETONES UA: NEGATIVE
Nitrite, UA: NEGATIVE
Protein, UA: NEGATIVE
SPEC GRAV UA: 1.015 (ref 1.010–1.025)
Urobilinogen, UA: 0.2 E.U./dL
pH, UA: 7 (ref 5.0–8.0)

## 2017-11-23 MED ORDER — NITROFURANTOIN MONOHYD MACRO 100 MG PO CAPS: 100.0000 mg | ORAL_CAPSULE | Freq: Two times a day (BID) | ORAL | Status: DC

## 2017-11-23 MED ORDER — FLUCONAZOLE 150 MG PO TABS: 150.0000 mg | ORAL_TABLET | Freq: Once | ORAL | 0 refills | Status: AC

## 2017-11-23 NOTE — Patient Instructions (Signed)

## 2017-11-23 NOTE — Progress Notes (Addendum)
Subjective: Ashley Wiley is a 42 YO female. She presents today complaining of burning with urination, urinary urgency and frequency x 4d. Her symptoms have worsened since their onset. Denies hematuria, back or abdominal pain, nausea/vomiting or vaginal d/c. Does not have UTIs frequently. Was on roadtrip recently, holding urine, not drinking enough. Denies F/C. No recent abx. No at home tx attempted.   Pt states she was recently dx w/aneurysm in her brain, discovered during work up for new onset headaches and some vision issues. She denies any significant changes to her symptoms. Denies hx of HTN. Has appt to see neurosurgeon Monday 11/26/16.  Objective: GEN: Well-developed, well-nourished, NAD. HEART: Regular rate and rhythm, no murmurs, rubs or gallops. LUNGS: clear to auscultation bilaterally ABD: Mild suprapubic tenderness to palpation, no costovertebral angle tenderness.  Urinalysis showed 2+ blood and 2+ Leukocyte esterase, nitrite negative.  Assessment: Urinary Tract Infection, Cystitis  Plan: Macrobid 100 mg BID x 7 days. Encouraged to drink plenty of water, avoid EtOH and caffeine. May take OTC phenazopyridine or Tylenol PRN pain/burning. Follow up with PCP or urgent care if symptoms not improving over next 2-3 days with antibiotic, if develops new/worsening symptoms (fever, abdominal pain/back pain, nausea/vomiting) or if symptoms do not completely resolve following antibiotic.  Reminded patient she should present to ED with severe headache or significant vision changes. Keep appt. with neurosurgeon next week.   Patient requested single dose of Fluconazole to take if she develops yeast infection from antibiotic.  **Please note that today's note was inadvertently closed/signed before full documentation was completed. Note was addended for this reason.

## 2017-11-24 ENCOUNTER — Other Ambulatory Visit: Payer: Self-pay | Admitting: Family

## 2017-11-24 ENCOUNTER — Encounter: Payer: Self-pay | Admitting: Medical

## 2017-11-24 MED ORDER — NITROFURANTOIN MONOHYD MACRO 100 MG PO CAPS: 100.0000 mg | ORAL_CAPSULE | Freq: Two times a day (BID) | ORAL | 0 refills | Status: DC

## 2017-11-27 ENCOUNTER — Other Ambulatory Visit: Payer: Self-pay | Admitting: Nurse Practitioner

## 2017-11-27 MED ORDER — NITROFURANTOIN MONOHYD MACRO 100 MG PO CAPS: 100.0000 mg | ORAL_CAPSULE | Freq: Two times a day (BID) | ORAL | 0 refills | Status: AC

## 2017-11-27 NOTE — Progress Notes (Signed)
Patient was seen at [facility] on 1/4 and given prescription for Macrobid.  Medication sent to the incorrect pharmacy.  Resending prescription to [facility] 100mg  BID x 7 days.

## 2017-11-28 ENCOUNTER — Other Ambulatory Visit: Payer: Self-pay | Admitting: Neurosurgery

## 2017-11-28 DIAGNOSIS — I671 Cerebral aneurysm, nonruptured: Secondary | ICD-10-CM

## 2017-12-04 ENCOUNTER — Other Ambulatory Visit: Payer: Self-pay | Admitting: Neurosurgery

## 2017-12-04 ENCOUNTER — Encounter (HOSPITAL_COMMUNITY): Payer: Self-pay | Admitting: Neurosurgery

## 2017-12-04 ENCOUNTER — Ambulatory Visit (HOSPITAL_COMMUNITY)
Admission: RE | Admit: 2017-12-04 | Discharge: 2017-12-04 | Disposition: A | Discharge status: Home / Self Care | Payer: No Typology Code available for payment source | Source: Ambulatory Visit | Attending: Neurosurgery | Admitting: Neurosurgery

## 2017-12-04 DIAGNOSIS — E119 Type 2 diabetes mellitus without complications: Secondary | ICD-10-CM | POA: Diagnosis not present

## 2017-12-04 DIAGNOSIS — I671 Cerebral aneurysm, nonruptured: Secondary | ICD-10-CM | POA: Diagnosis not present

## 2017-12-04 DIAGNOSIS — Z6830 Body mass index (BMI) 30.0-30.9, adult: Secondary | ICD-10-CM | POA: Diagnosis not present

## 2017-12-04 DIAGNOSIS — E559 Vitamin D deficiency, unspecified: Secondary | ICD-10-CM | POA: Diagnosis not present

## 2017-12-04 DIAGNOSIS — Z7984 Long term (current) use of oral hypoglycemic drugs: Secondary | ICD-10-CM | POA: Diagnosis not present

## 2017-12-04 DIAGNOSIS — F329 Major depressive disorder, single episode, unspecified: Secondary | ICD-10-CM | POA: Insufficient documentation

## 2017-12-04 DIAGNOSIS — Z7951 Long term (current) use of inhaled steroids: Secondary | ICD-10-CM | POA: Diagnosis not present

## 2017-12-04 DIAGNOSIS — Z79899 Other long term (current) drug therapy: Secondary | ICD-10-CM | POA: Insufficient documentation

## 2017-12-04 DIAGNOSIS — R51 Headache: Secondary | ICD-10-CM | POA: Diagnosis present

## 2017-12-04 HISTORY — PX: IR ANGIO INTRA EXTRACRAN SEL INTERNAL CAROTID BILAT MOD SED: IMG5363

## 2017-12-04 HISTORY — PX: IR 3D INDEPENDENT WKST: IMG2385

## 2017-12-04 HISTORY — PX: IR ANGIO VERTEBRAL SEL VERTEBRAL BILAT MOD SED: IMG5369

## 2017-12-04 LAB — BASIC METABOLIC PANEL
Anion gap: 9 (ref 5–15)
BUN: 10 mg/dL (ref 6–20)
CO2: 23 mmol/L (ref 22–32)
Calcium: 8.8 mg/dL — ABNORMAL LOW (ref 8.9–10.3)
Chloride: 102 mmol/L (ref 101–111)
Creatinine, Ser: 0.64 mg/dL (ref 0.44–1.00)
GFR calc Af Amer: >60 mL/min (ref >60)
GFR calc non Af Amer: >60 mL/min (ref >60)
GLUCOSE: 85 mg/dL (ref 65–99)
POTASSIUM: 4.1 mmol/L (ref 3.5–5.1)
SODIUM: 134 mmol/L — AB (ref 135–145)

## 2017-12-04 LAB — GLUCOSE, CAPILLARY
GLUCOSE-CAPILLARY: 85 mg/dL (ref 65–99)
Glucose-Capillary: 87 mg/dL (ref 65–99)
Glucose-Capillary: 67 mg/dL (ref 65–99)

## 2017-12-04 LAB — CBC WITH DIFFERENTIAL/PLATELET
Basophils Absolute: 0 10*3/uL (ref 0.0–0.1)
Basophils Relative: 1 %
EOS PCT: 3 %
Eosinophils Absolute: 0.2 10*3/uL (ref 0.0–0.7)
HEMATOCRIT: 37.5 % (ref 36.0–46.0)
Hemoglobin: 12.5 g/dL (ref 12.0–15.0)
Lymphocytes Relative: 34 %
Lymphs Abs: 2 10*3/uL (ref 0.7–4.0)
MCH: 29.9 pg (ref 26.0–34.0)
MCHC: 33.3 g/dL (ref 30.0–36.0)
MCV: 89.7 fL (ref 78.0–100.0)
Monocytes Absolute: 0.3 10*3/uL (ref 0.1–1.0)
Monocytes Relative: 6 %
NEUTROS ABS: 3.5 10*3/uL (ref 1.7–7.7)
Neutrophils Relative %: 58 %
PLATELETS: 190 10*3/uL (ref 150–400)
RBC: 4.18 MIL/uL (ref 3.87–5.11)
RDW: 12.6 % (ref 11.5–15.5)
WBC: 6 10*3/uL (ref 4.0–10.5)

## 2017-12-04 MED ORDER — NITROGLYCERIN 1 MG/10 ML FOR IR/CATH LAB
INTRA_ARTERIAL | Status: AC
  Filled 2017-12-04: qty 10

## 2017-12-04 MED ORDER — FENTANYL CITRATE (PF) 100 MCG/2ML IJ SOLN
INTRAMUSCULAR | Status: AC | PRN
  Administered 2017-12-04: 25 ug via INTRAVENOUS

## 2017-12-04 MED ORDER — FENTANYL CITRATE (PF) 100 MCG/2ML IJ SOLN
INTRAMUSCULAR | Status: AC
  Filled 2017-12-04: qty 2

## 2017-12-04 MED ORDER — MIDAZOLAM HCL 2 MG/2ML IJ SOLN
INTRAMUSCULAR | Status: AC | PRN
  Administered 2017-12-04: 1 mg via INTRAVENOUS

## 2017-12-04 MED ORDER — IOPAMIDOL (ISOVUE-300) INJECTION 61%
INTRAVENOUS | Status: AC
  Administered 2017-12-04: 50 mL
  Filled 2017-12-04: qty 150

## 2017-12-04 MED ORDER — HYDROCODONE-ACETAMINOPHEN 5-325 MG PO TABS: 1.0000 | ORAL_TABLET | ORAL | Status: DC | PRN

## 2017-12-04 MED ORDER — SODIUM CHLORIDE 0.9 % IV SOLN: INTRAVENOUS | Status: DC

## 2017-12-04 MED ORDER — IOPAMIDOL (ISOVUE-300) INJECTION 61%
INTRAVENOUS | Status: AC
  Administered 2017-12-04: 21 mL
  Filled 2017-12-04: qty 100

## 2017-12-04 MED ORDER — HEPARIN SODIUM (PORCINE) 1000 UNIT/ML IJ SOLN
INTRAMUSCULAR | Status: AC
  Filled 2017-12-04: qty 1

## 2017-12-04 MED ORDER — MIDAZOLAM HCL 2 MG/2ML IJ SOLN
INTRAMUSCULAR | Status: AC
  Filled 2017-12-04: qty 2

## 2017-12-04 MED ORDER — SODIUM CHLORIDE 0.9 % IV SOLN
INTRAVENOUS | Status: DC
  Administered 2017-12-04: 11:00:00 via INTRAVENOUS

## 2017-12-04 MED ORDER — LIDOCAINE HCL 1 % IJ SOLN
INTRAMUSCULAR | Status: AC | PRN
  Administered 2017-12-04: 10 mL

## 2017-12-04 MED ORDER — LIDOCAINE HCL 1 % IJ SOLN
INTRAMUSCULAR | Status: AC
  Filled 2017-12-04: qty 20

## 2017-12-04 MED ORDER — HEPARIN SODIUM (PORCINE) 1000 UNIT/ML IJ SOLN
INTRAMUSCULAR | Status: AC | PRN
  Administered 2017-12-04: 2000 [IU] via INTRAVENOUS

## 2017-12-04 NOTE — Discharge Instructions (Addendum)
.NO METFORMIN/GLUCOPHAGE FOR 2 DAYS  Cerebral Angiogram, Care After Refer to this sheet in the next few weeks. These instructions provide you with information on caring for yourself after your procedure. Your health care provider may also give you more specific instructions. Your treatment has been planned according to current medical practices, but problems sometimes occur. Call your health care provider if you have any problems or questions after your procedure. What can I expect after the procedure? After your procedure, it is typical to have the following:  Bruising at the catheter insertion site that usually fades within 1-2 weeks.  Blood collecting in the tissue (hematoma) that may be painful to the touch. It should usually decrease in size and tenderness within 1-2 weeks.  A mild headache.  Follow these instructions at home:  Take medicines only as directed by your health care provider.  You may shower 24-48 hours after the procedure or as directed by your health care provider. Remove the bandage (dressing) and gently wash the site with plain soap and water. Pat the area dry with a clean towel. Do not rub the site, because this may cause bleeding.  Do not take baths, swim, or use a hot tub until your health care provider approves.  Check your insertion site every day for redness, swelling, or drainage.  Do not apply powder or lotion to the site.  Do not lift over 10 lb (4.5 kg) for 5 days after your procedure or as directed by your health care provider.  Ask your health care provider when it is okay to: ? Return to work or school. ? Resume usual physical activities or sports. ? Resume sexual activity.  Do not drive home if you are discharged the same day as the procedure. Have someone else drive you.  You may drive 24 hours after the procedure unless otherwise instructed by your health care provider.  Do not operate machinery or power tools for 24 hours after the procedure  or as directed by your health care provider.  If your procedure was done as an outpatient procedure, which means that you went home the same day as your procedure, a responsible adult should be with you for the first 24 hours after you arrive home.  Keep all follow-up visits as directed by your health care provider. This is important. Contact a health care provider if:  You have a fever.  You have chills.  You have increased bleeding from the catheter insertion site. Hold pressure on the site. Get help right away if:  You have vision changes or loss of vision.  You have numbness or weakness on one side of your body.  You have difficulty talking, or you have slurred speech or cannot speak (aphasia).  You feel confused or have difficulty remembering.  You have unusual pain at the catheter insertion site.  You have redness, warmth, or swelling at the catheter insertion site.  You have drainage (other than a small amount of blood on the dressing) from the catheter insertion site.  The catheter insertion site is bleeding, and the bleeding does not stop after 30 minutes of holding steady pressure on the site. These symptoms may represent a serious problem that is an emergency. Do not wait to see if the symptoms will go away. Get medical help right away. Call your local emergency services (911 in [place] Do not drive yourself to the hospital. This information is not intended to replace advice given to you by your health care provider.  Make sure you discuss any questions you have with your health care provider. Document Released: 03/23/2014 Document Revised: 04/13/2016 Document Reviewed: 11/19/2013 Elsevier Interactive Patient Education  2017 [place]. Moderate Conscious Sedation, Adult, Care After These instructions provide you with information about caring for yourself after your procedure. Your health care provider may also give you more specific instructions. Your treatment has been  planned according to current medical practices, but problems sometimes occur. Call your health care provider if you have any problems or questions after your procedure. What can I expect after the procedure? After your procedure, it is common:  To feel sleepy for several hours.  To feel clumsy and have poor balance for several hours.  To have poor judgment for several hours.  To vomit if you eat too soon.  Follow these instructions at home: For at least 24 hours after the procedure:   Do not: ? Participate in activities where you could fall or become injured. ? Drive. ? Use heavy machinery. ? Drink alcohol. ? Take sleeping pills or medicines that cause drowsiness. ? Make important decisions or sign legal documents. ? Take care of children on your own.  Rest. Eating and drinking  Follow the diet recommended by your health care provider.  If you vomit: ? Drink water, juice, or soup when you can drink without vomiting. ? Make sure you have little or no nausea before eating solid foods. General instructions  Have a responsible adult stay with you until you are awake and alert.  Take over-the-counter and prescription medicines only as told by your health care provider.  If you smoke, do not smoke without supervision.  Keep all follow-up visits as told by your health care provider. This is important. Contact a health care provider if:  You keep feeling nauseous or you keep vomiting.  You feel light-headed.  You develop a rash.  You have a fever. Get help right away if:  You have trouble breathing. This information is not intended to replace advice given to you by your health care provider. Make sure you discuss any questions you have with your health care provider. Document Released: 08/27/2013 Document Revised: 04/10/2016 Document Reviewed: 02/26/2016 Elsevier Interactive Patient Education  [employer].

## 2017-12-04 NOTE — Sedation Documentation (Signed)
Patient is resting comfortably. 

## 2017-12-04 NOTE — Progress Notes (Signed)
Client given groin instructions to hold groin and call nurse if feels anything warm,wet,painful,swollen,burning or stinging right groin and hold groin if laughs coughs or sneezes and voiced understanding

## 2017-12-04 NOTE — H&P (Signed)
Chief Complaint  Aneurysm  History of Present Illness  [name] is a 42 year old woman I am seeing at the request of Dr. Wynetta Emery. She has a history of sinus infections for which she has previously undergone surgery. Unfortunately she began to have recurrence of right-sided headaches primarily in the frontal region and around her eye. She underwent repeat maxillofacial CT which did not demonstrate any significant sinus pathology, however discovered incidentally a possible anterior communicating artery aneurysm. She underwent further workup with MRA which again demonstrated the likely anterior communicating artery aneurysm. She was therefore referred for neurosurgical evaluation. Of note, she does not have any history of hypertension, does have a history of diabetes which is well controlled. She says her last hemoglobin A1c was 6.1. There is no known family history of intracranial aneurysms. She is a nonsmoker. She presents today for further w/u of the aneurysm with diagnostic cerebral angiogram.   Past Medical History   Past Medical History:  Diagnosis Date  . Allergy   . Depression   . Dermatofibroma   . Diabetes mellitus without complication ([place])    Takes Metformin  . Facial pain   . Hyperlipidemia   . IFG (impaired fasting glucose)   . Low HDL (under 40)   . Overweight   . Patella-femoral syndrome   . Pinguecula   . Thyroid nodule april 2016  . Vitamin D deficiency disease     Past Surgical History   Past Surgical History:  Procedure Laterality Date  . CARPAL TUNNEL RELEASE    . GANGLION CYST EXCISION Left   . NASAL SINUS SURGERY  Nov 2015  . TUBAL LIGATION      Social History   Social History   Tobacco Use  . Smoking status: Never Smoker  . Smokeless tobacco: Never Used  Substance Use Topics  . Alcohol use: Yes    Alcohol/week: 0.0 - 0.6 oz    Comment: on occasion  . Drug use: No    Medications   Prior to Admission medications   Medication Sig Start Date  End Date Taking? Authorizing Provider  busPIRone (BUSPAR) 5 MG tablet Take 1-3 tablets (5-15 mg total) by mouth 3 (three) times daily. 07/17/17  Yes Johnson, Megan P, DO  Cholecalciferol (VITAMIN D) 2000 UNITS CAPS Take 5,000 Units by mouth daily. Reported on 01/13/2016   Yes [provider]  cyanocobalamin 1000 MCG tablet Take 2,000 mcg by mouth daily. Reported on 01/13/2016   Yes [provider]  cyclobenzaprine (FLEXERIL) 10 MG tablet Take 1 tablet (10 mg total) by mouth at bedtime. 10/23/17  Yes Johnson, Megan P, DO  FLUoxetine (PROZAC) 20 MG capsule Take 3 capsules (60 mg total) by mouth daily. 07/17/17  Yes Johnson, Megan P, DO  fluticasone (FLONASE) 50 MCG/ACT nasal spray Place 2 sprays into both nostrils daily. 10/23/17  Yes Johnson, Megan P, DO  loratadine (CLARITIN) 10 MG tablet Take 10 mg by mouth daily. Reported on 01/13/2016   Yes [provider]  metFORMIN (GLUCOPHAGE XR) 500 MG 24 hr tablet Take 2 tablets (1,000 mg total) by mouth every evening. 07/17/17  Yes Johnson, Megan P, DO  nortriptyline (PAMELOR) 25 MG capsule Take 1 capsule (25 mg total) by mouth at bedtime. 11/08/17  Yes Johnson, Megan P, DO  glucose blood (BAYER CONTOUR TEST) test strip 1 each by Other route as needed for other. Use as instructed    [provider]  nitrofurantoin, macrocrystal-monohydrate, (MACROBID) 100 MG capsule Take 1 capsule (100 mg  total) by mouth 2 (two) times daily. 11/24/17   Kennyth Arnold, FNP  nitrofurantoin, macrocrystal-monohydrate, (MACROBID) 100 MG capsule Take 1 capsule (100 mg total) by mouth 2 (two) times daily for 7 days. 11/27/17 12/04/17  Kara Dies, NP    Allergies   Allergies  Allergen Reactions  . Wellbutrin [Bupropion] Other (See Comments)    Increased anxiety and tremors    Review of Systems  ROS  Neurologic Exam  Awake, alert, oriented Memory and concentration grossly intact Speech fluent, appropriate CN grossly intact Motor  exam: Upper Extremities Deltoid Bicep Tricep Grip  Right 5/5 5/5 5/5 5/5  Left 5/5 5/5 5/5 5/5   Lower Extremities IP Quad PF DF EHL  Right 5/5 5/5 5/5 5/5 5/5  Left 5/5 5/5 5/5 5/5 5/5   Sensation grossly intact to LT  Imaging  MRA of the brain dated December 2018 was reviewed. This demonstrates a inferiorly and rightward projecting aneurysm at the left A1 A2 junction measuring approximately 5 millimeters.  Impression  42 year old woman with incidentally discovered likely anterior communicating artery aneurysm. Although the aneurysm is marginal in size being slightly less than 41mm, given the patient's young age I do think that if at all feasible treatment would be reasonable.  Plan  I will plan on proceeding with diagnostic cerebral angiogram for further characterization of the aneurysm   The risks of the angiogram procedure were reviewed previously in the office, including a 0.1% risk of stroke, and overall risk of approximately 1% including but not limited to groin hematoma, headache, contrast reaction, and nephropathy.   The patient understood our discussion and is willing to proceed as above.

## 2017-12-04 NOTE — Progress Notes (Signed)
Called Dr. Kathyrn Sheriff regarding no orders for consent. Informed this nurse that consent would be obtained after arriving to IR. Called and spoke with Whitney in IR to make sure she was aware that pt would be coming down without consent signed.

## 2017-12-17 ENCOUNTER — Other Ambulatory Visit (HOSPITAL_COMMUNITY): Payer: Self-pay | Admitting: Neurosurgery

## 2017-12-17 ENCOUNTER — Other Ambulatory Visit: Payer: Self-pay | Admitting: Neurosurgery

## 2017-12-17 DIAGNOSIS — I671 Cerebral aneurysm, nonruptured: Secondary | ICD-10-CM

## 2017-12-18 ENCOUNTER — Ambulatory Visit (INDEPENDENT_AMBULATORY_CARE_PROVIDER_SITE_OTHER): Payer: No Typology Code available for payment source | Admitting: Family Medicine

## 2017-12-18 ENCOUNTER — Encounter: Payer: Self-pay | Admitting: Family Medicine

## 2017-12-18 VITALS — BP 147/88 | HR 99 | Wt 161.1 lb

## 2017-12-18 DIAGNOSIS — S90821A Blister (nonthermal), right foot, initial encounter: Secondary | ICD-10-CM

## 2017-12-18 DIAGNOSIS — F332 Major depressive disorder, recurrent severe without psychotic features: Secondary | ICD-10-CM

## 2017-12-18 MED ORDER — VENLAFAXINE HCL ER 75 MG PO CP24: 75.0000 mg | ORAL_CAPSULE | Freq: Every day | ORAL | 3 refills | Status: DC

## 2017-12-18 NOTE — Assessment & Plan Note (Signed)
Not doing great. Will change to effexor and recheck 1 month. Call with any concerns.

## 2017-12-18 NOTE — Progress Notes (Signed)
BP (!) 147/88 (BP Location: Right Arm, Patient Position: Sitting, Cuff Size: Normal)   Pulse 99   Wt 161 lb 2 oz (73.1 kg)   LMP 12/04/2017 Comment: tubal ligation  SpO2 99%   BMI 31.47 kg/m    Subjective:    Patient ID: Ashley Wiley, female    DOB: Aug 12, 1976, 42 y.o.   MRN: [id number]  HPI: Ashley Wiley is a 42 y.o. female  Chief Complaint  Patient presents with  . Depression  . Anxiety   ANXIETY/STRESS- feels like the fluoxetine was making her jittery- better since coming off of it- did not taper it, stopped it 2 days ago. Previous medicine: prozac, lexapro, wellbutrin Duration:exacerbated Anxious mood: yes  Excessive worrying: yes Irritability: yes  Sweating: no Nausea: no Palpitations:no Hyperventilation: no Panic attacks: no Agoraphobia: no  Obscessions/compulsions: no Depressed mood: yes Depression screen [facility] 2/9 12/18/2017 07/17/2017 06/05/2017 05/11/2017 05/04/2017  Decreased Interest 1 1 1 2 2   Down, Depressed, Hopeless 1 0 1 2 1   PHQ - 2 Score 2 1 2 4 3   Altered sleeping 2 0 2 3 2   Tired, decreased energy 2 1 2 2 2   Change in appetite 2 1 2 3 2   Feeling bad or failure about yourself  1 0 2 1 2   Trouble concentrating 2 0 2 2 2   Moving slowly or fidgety/restless 2 0 2 2 1   Suicidal thoughts 0 0 0 0 0  PHQ-9 Score 13 3 14 17 14    GAD 7 : Generalized Anxiety Score 12/18/2017 06/05/2017 05/11/2017 05/04/2017  Nervous, Anxious, on Edge 3 2 3 3   Control/stop worrying 2 2 3 3   Worry too much - different things 2 2 3 3   Trouble relaxing 2 2 3 3   Restless 2 2 3 2   Easily annoyed or irritable 2 2 3 3   Afraid - awful might happen 2 1 3 3   Total GAD 7 Score 15 13 21 20   Anxiety Difficulty Somewhat difficult - Somewhat difficult Somewhat difficult   Anhedonia: no Weight changes: no Insomnia: no   Hypersomnia: no Fatigue/loss of energy: yes Feelings of worthlessness: yes Feelings of guilt: yes Impaired concentration/indecisiveness: yes Suicidal  ideations: no  Crying spells: yes Recent Stressors/Life Changes: yes   Relationship problems: yes   Family stress: yes     Financial stress: yes    Job stress: yes    Recent death/loss: no  Has a blister on the bottom of her foot- has been hurting. Not getting better.    Relevant past medical, surgical, family and social history reviewed and updated as indicated. Interim medical history since our last visit reviewed. Allergies and medications reviewed and updated.  Review of Systems  Constitutional: Negative.   Respiratory: Negative.   Cardiovascular: Negative.   Skin:       Blister on bottom of R foot  Psychiatric/Behavioral: Positive for dysphoric mood. Negative for agitation, behavioral problems, confusion, decreased concentration, hallucinations, self-injury, sleep disturbance and suicidal ideas. The patient is nervous/anxious. The patient is not hyperactive.     Per HPI unless specifically indicated above     Objective:    BP (!) 147/88 (BP Location: Right Arm, Patient Position: Sitting, Cuff Size: Normal)   Pulse 99   Wt 161 lb 2 oz (73.1 kg)   LMP 12/04/2017 Comment: tubal ligation  SpO2 99%   BMI 31.47 kg/m   Wt Readings from Last 3 Encounters:  12/18/17 161 lb 2 oz (73.1 kg)  12/04/17  156 lb (70.8 kg)  11/23/17 155 lb (70.3 kg)    Physical Exam  Constitutional: She is oriented to person, place, and time. She appears well-developed and well-nourished. No distress.  HENT:  Head: Normocephalic and atraumatic.  Right Ear: Hearing normal.  Left Ear: Hearing normal.  Nose: Nose normal.  Eyes: Conjunctivae and lids are normal. Right eye exhibits no discharge. Left eye exhibits no discharge. No scleral icterus.  Cardiovascular: Normal rate, regular rhythm, normal heart sounds and intact distal pulses. Exam reveals no gallop and no friction rub.  No murmur heard. Pulmonary/Chest: Effort normal and breath sounds normal. No respiratory distress. She has no wheezes.  She has no rales. She exhibits no tenderness.  Musculoskeletal: Normal range of motion.  Neurological: She is alert and oriented to person, place, and time.  Skin: Skin is warm, dry and intact. No rash noted. She is not diaphoretic. No erythema. No pallor.  1.5inch blister on bottom of R foot  Psychiatric: She has a normal mood and affect. Her speech is normal and behavior is normal. Judgment and thought content normal. Cognition and memory are normal.  Nursing note and vitals reviewed.   Results for orders placed or performed during the hospital encounter of 12/04/17  Glucose, capillary  Result Value Ref Range   Glucose-Capillary 87 65 - 99 mg/dL  Basic metabolic panel  Result Value Ref Range   Sodium 134 (L) 135 - 145 mmol/L   Potassium 4.1 3.5 - 5.1 mmol/L   Chloride 102 101 - 111 mmol/L   CO2 23 22 - 32 mmol/L   Glucose, Bld 85 65 - 99 mg/dL   BUN 10 6 - 20 mg/dL   Creatinine, Ser 0.64 0.44 - 1.00 mg/dL   Calcium 8.8 (L) 8.9 - 10.3 mg/dL   GFR calc non Af Amer >60 >60 mL/min   GFR calc Af Amer >60 >60 mL/min   Anion gap 9 5 - 15  CBC WITH DIFFERENTIAL  Result Value Ref Range   WBC 6.0 4.0 - 10.5 K/uL   RBC 4.18 3.87 - 5.11 MIL/uL   Hemoglobin 12.5 12.0 - 15.0 g/dL   HCT 37.5 36.0 - 46.0 %   MCV 89.7 78.0 - 100.0 fL   MCH 29.9 26.0 - 34.0 pg   MCHC 33.3 30.0 - 36.0 g/dL   RDW 12.6 11.5 - 15.5 %   Platelets 190 150 - 400 K/uL   Neutrophils Relative % 58 %   Neutro Abs 3.5 1.7 - 7.7 K/uL   Lymphocytes Relative 34 %   Lymphs Abs 2.0 0.7 - 4.0 K/uL   Monocytes Relative 6 %   Monocytes Absolute 0.3 0.1 - 1.0 K/uL   Eosinophils Relative 3 %   Eosinophils Absolute 0.2 0.0 - 0.7 K/uL   Basophils Relative 1 %   Basophils Absolute 0.0 0.0 - 0.1 K/uL  Glucose, capillary  Result Value Ref Range   Glucose-Capillary 67 65 - 99 mg/dL   Comment 1 Notify RN   Glucose, capillary  Result Value Ref Range   Glucose-Capillary 85 65 - 99 mg/dL   Comment 1 Notify RN         Assessment & Plan:   Problem List Items Addressed This Visit      Other   Depression - Primary    Not doing great. Will change to effexor and recheck 1 month. Call with any concerns.       Relevant Medications   venlafaxine XR (EFFEXOR-XR) 75 MG 24 hr  capsule    Other Visit Diagnoses    Blister of right foot, initial encounter       Under semi-sterile conditions blister drained with 22 gauge needle and dressed with non-stick pads with triple antibiotic ointment. Good results.        Follow up plan: Return in about 4 weeks (around 01/15/2018) for Follow up mood.

## 2017-12-28 NOTE — Pre-Procedure Instructions (Addendum)
Ashley Wiley  12/28/2017      [place] [place] [address] Phone: [phone] Fax: [phone]  [place], [place] - [address] [place] [address] Phone: [phone] Fax: [phone]    Your procedure is scheduled on Friday, Feb. 15th   Report to [facility] Admitting at  6:30AM             (posted procedural time 8:30a - 11:30a)   Call this number if you have problems the morning of surgery:  [phone]   Remember:              4-5 days prior to surgery, STOP TAKING any Vitamins, Herbal Supplements, Anti-inflammatories.   Do not eat food or drink liquids after midnight, Thursday.   Take these medicines the morning of surgery with A SIP OF WATER : Effexor   Do not wear jewelry, make-up or nail polish.  Do not wear lotions, powders, perfumes, or deodorant.  Do not shave 48 hours prior to surgery.   Do not bring valuables to the hospital.  [facility] is not responsible for any belongings or valuables.  Contacts, dentures or bridgework may not be worn into surgery.  Leave your suitcase in the car.  After surgery it may be brought to your room.  For patients admitted to the hospital, discharge time will be determined by your treatment team.  Please read over the following fact sheets that you were given. Pain Booklet and Surgical Site Infection Prevention    [place]- Preparing For Surgery  Before surgery, you can play an important role. Because skin is not sterile, your skin needs to be as free of germs as possible. You can reduce the number of germs on your skin by washing with CHG (chlorahexidine gluconate) Soap before surgery.  CHG is an antiseptic cleaner which kills germs and bonds with the skin to continue killing germs even after washing.  Please do not use if you have an allergy to CHG or antibacterial  soaps. If your skin becomes reddened/irritated stop using the CHG.  Do not shave (including legs and underarms) for at least 48 hours prior to first CHG shower. It is OK to shave your face.  Please follow these instructions carefully.   1. Shower the NIGHT BEFORE SURGERY and the MORNING OF SURGERY with CHG.   2. If you chose to wash your hair, wash your hair first as usual with your normal shampoo.  3. After you shampoo, rinse your hair and body thoroughly to remove the shampoo.  4. Use CHG as you would any other liquid soap. You can apply CHG directly to the skin and wash gently with a scrungie or a clean washcloth.   5. Apply the CHG Soap to your body ONLY FROM THE NECK DOWN.  Do not use on open wounds or open sores. Avoid contact with your eyes, ears, mouth and genitals (private parts). Wash Face and genitals (private parts)  with your normal soap.  6. Wash thoroughly, paying special attention to the area where your surgery will be performed.  7. Thoroughly rinse your body with warm water from the neck down.  8. DO NOT shower/wash with your normal soap after using and rinsing off the CHG Soap.  9. Pat yourself dry with a CLEAN TOWEL.  10. Wear CLEAN PAJAMAS to bed the night before  surgery, wear comfortable clothes the morning of surgery  11. Place CLEAN SHEETS on your bed the night of your first shower and DO NOT SLEEP WITH PETS.  Day of Surgery: Do not apply any deodorants/lotions. Please wear clean clothes to the hospital/surgery center.     How to Manage Your Diabetes Before and After Surgery  Why is it important to control my blood sugar before and after surgery? . Improving blood sugar levels before and after surgery helps healing and can limit problems. . A way of improving blood sugar control is eating a healthy diet by: o  Eating less sugar and carbohydrates o  Increasing activity/exercise o  Talking with your doctor about reaching your blood sugar goals  . High  blood sugars (greater than 180 mg/dL) can raise your risk of infections and slow your recovery, so you will need to focus on controlling your diabetes during the weeks before surgery. . Make sure that the doctor who takes care of your diabetes knows about your planned surgery including the date and location.  How do I manage my blood sugar before surgery? . Check your blood sugar at least 4 times a day, starting 2 days before surgery, to make sure that the level is not too high or low. o Check your blood sugar the morning of your surgery when you wake up and every 2 hours until you get to the Short Stay unit.  . If your blood sugar is less than 70 mg/dL, you will need to treat for low blood sugar: o Do not take insulin. o Treat a low blood sugar (less than 70 mg/dL) with  cup of clear juice (cranberry or apple), 4 glucose tablets, OR glucose gel.  Recheck blood sugar in 15 minutes after treatment (to make sure it is greater than 70 mg/dL). If your blood sugar is not greater than 70 mg/dL on recheck, call [phone] o  for further instructions. . Report your blood sugar to the short stay nurse when you get to Short Stay.  . If you are admitted to the hospital after surgery: o Your blood sugar will be checked by the staff and you will probably be given insulin after surgery (instead of oral diabetes medicines) to make sure you have good blood sugar levels. o The goal for blood sugar control after surgery is 80-180 mg/dL.  WHAT DO I DO ABOUT MY DIABETES MEDICATION?   Marland Kitchen Do not take oral diabetes medicines (pills) the morning of surgery.  . The day of surgery, do not take other diabetes injectables, including Byetta (exenatide), Bydureon (exenatide ER), Victoza (liraglutide), or Trulicity (dulaglutide).  . If your CBG is greater than 220 mg/dL, you may take  of your sliding scale (correction) dose of insulin.  Other Instructions:          Patient Signature:  Date:   Nurse  Signature:  Date:   Reviewed and Endorsed by [facility] Patient Education Committee, August 2015

## 2017-12-31 ENCOUNTER — Encounter (HOSPITAL_COMMUNITY): Payer: Self-pay

## 2017-12-31 ENCOUNTER — Other Ambulatory Visit: Payer: Self-pay

## 2017-12-31 ENCOUNTER — Encounter (HOSPITAL_COMMUNITY)
Admission: RE | Admit: 2017-12-31 | Discharge: 2017-12-31 | Disposition: A | Discharge status: Home / Self Care | Payer: No Typology Code available for payment source | Source: Ambulatory Visit | Attending: Neurosurgery | Admitting: Neurosurgery

## 2017-12-31 DIAGNOSIS — Z01812 Encounter for preprocedural laboratory examination: Secondary | ICD-10-CM | POA: Diagnosis present

## 2017-12-31 HISTORY — DX: Headache, unspecified: R51.9

## 2017-12-31 HISTORY — DX: Anxiety disorder, unspecified: F41.9

## 2017-12-31 HISTORY — DX: Headache: R51

## 2017-12-31 HISTORY — DX: Gastro-esophageal reflux disease without esophagitis: K21.9

## 2017-12-31 LAB — BASIC METABOLIC PANEL
Anion gap: 13 (ref 5–15)
BUN: 10 mg/dL (ref 6–20)
CO2: 23 mmol/L (ref 22–32)
Calcium: 9 mg/dL (ref 8.9–10.3)
Chloride: 100 mmol/L — ABNORMAL LOW (ref 101–111)
Creatinine, Ser: 0.64 mg/dL (ref 0.44–1.00)
GFR calc Af Amer: >60 mL/min (ref >60)
GLUCOSE: 92 mg/dL (ref 65–99)
POTASSIUM: 3.8 mmol/L (ref 3.5–5.1)
Sodium: 136 mmol/L (ref 135–145)

## 2017-12-31 LAB — CBC WITH DIFFERENTIAL/PLATELET
BASOS ABS: 0 10*3/uL (ref 0.0–0.1)
Basophils Relative: 1 %
Eosinophils Absolute: 0.2 10*3/uL (ref 0.0–0.7)
Eosinophils Relative: 3 %
HCT: 41.3 % (ref 36.0–46.0)
Hemoglobin: 13.7 g/dL (ref 12.0–15.0)
LYMPHS PCT: 32 %
Lymphs Abs: 2 10*3/uL (ref 0.7–4.0)
MCH: 30.3 pg (ref 26.0–34.0)
MCHC: 33.2 g/dL (ref 30.0–36.0)
MCV: 91.4 fL (ref 78.0–100.0)
MONO ABS: 0.3 10*3/uL (ref 0.1–1.0)
MONOS PCT: 4 %
Neutro Abs: 3.9 10*3/uL (ref 1.7–7.7)
Neutrophils Relative %: 60 %
PLATELETS: 215 10*3/uL (ref 150–400)
RBC: 4.52 MIL/uL (ref 3.87–5.11)
RDW: 13 % (ref 11.5–15.5)
WBC: 6.3 10*3/uL (ref 4.0–10.5)

## 2017-12-31 LAB — PROTIME-INR
INR: 0.91
Prothrombin Time: 12.2 seconds (ref 11.4–15.2)

## 2017-12-31 LAB — URINALYSIS, ROUTINE W REFLEX MICROSCOPIC
BILIRUBIN URINE: NEGATIVE
Glucose, UA: NEGATIVE mg/dL
KETONES UR: NEGATIVE mg/dL
LEUKOCYTES UA: NEGATIVE
NITRITE: NEGATIVE
Protein, ur: NEGATIVE mg/dL
Specific Gravity, Urine: 1.012 (ref 1.005–1.030)
pH: 7 (ref 5.0–8.0)

## 2017-12-31 LAB — HEMOGLOBIN A1C
Hgb A1c MFr Bld: 5.7 % — ABNORMAL HIGH (ref 4.8–5.6)
Mean Plasma Glucose: 116.89 mg/dL

## 2017-12-31 LAB — GLUCOSE, CAPILLARY: Glucose-Capillary: 82 mg/dL (ref 65–99)

## 2017-12-31 LAB — APTT: APTT: 30 s (ref 24–36)

## 2017-12-31 NOTE — Progress Notes (Addendum)
PCP is Park Liter, DO at [facility] 11/2017 Denies any murmur, cp, sob.  No cardiac testing done. Only checks sugars every morning, ranges from 90 - 110 She has been instructed to take plavix & aspirin dos, per Dr. Maximiano Coss instructions.  Currently on her period. Also had tubal ligation I spoke to Dominica about her ua results.

## 2018-01-02 ENCOUNTER — Encounter: Payer: Self-pay | Admitting: Family Medicine

## 2018-01-02 MED ORDER — NORTRIPTYLINE HCL 50 MG PO CAPS
50.0000 mg | ORAL_CAPSULE | Freq: Every day | ORAL | 1 refills | Status: DC
Start: 2018-01-02 — End: 2018-02-22

## 2018-01-03 ENCOUNTER — Encounter (HOSPITAL_COMMUNITY): Payer: Self-pay | Admitting: Certified Registered Nurse Anesthetist

## 2018-01-04 ENCOUNTER — Ambulatory Visit (HOSPITAL_COMMUNITY)
Admission: RE | Admit: 2018-01-04 | Discharge: 2018-01-04 | Disposition: A | Discharge status: Home / Self Care | Payer: Self-pay | Source: Ambulatory Visit | Attending: Neurosurgery | Admitting: Neurosurgery

## 2018-01-04 ENCOUNTER — Encounter (HOSPITAL_COMMUNITY): Payer: Self-pay

## 2018-01-04 ENCOUNTER — Inpatient Hospital Stay (HOSPITAL_COMMUNITY)
Admission: RE | Admit: 2018-01-04 | Payer: No Typology Code available for payment source | Source: Ambulatory Visit | Admitting: Neurosurgery

## 2018-01-04 ENCOUNTER — Encounter (HOSPITAL_COMMUNITY): Admission: RE | Payer: Self-pay | Source: Ambulatory Visit

## 2018-01-04 SURGERY — IR WITH ANESTHESIA
Anesthesia: General

## 2018-01-18 ENCOUNTER — Encounter: Payer: 59 | Admitting: Family Medicine

## 2018-01-25 ENCOUNTER — Encounter: Payer: No Typology Code available for payment source | Admitting: Family Medicine

## 2018-02-05 ENCOUNTER — Encounter: Payer: Self-pay | Admitting: Family Medicine

## 2018-02-06 ENCOUNTER — Telehealth: Payer: No Typology Code available for payment source | Admitting: Family

## 2018-02-06 DIAGNOSIS — J019 Acute sinusitis, unspecified: Secondary | ICD-10-CM

## 2018-02-06 MED ORDER — AMOXICILLIN-POT CLAVULANATE 875-125 MG PO TABS: 1.0000 | ORAL_TABLET | Freq: Two times a day (BID) | ORAL | 0 refills | Status: DC

## 2018-02-06 NOTE — Progress Notes (Signed)

## 2018-02-07 ENCOUNTER — Other Ambulatory Visit: Payer: Self-pay | Admitting: Family Medicine

## 2018-02-07 ENCOUNTER — Ambulatory Visit (INDEPENDENT_AMBULATORY_CARE_PROVIDER_SITE_OTHER): Payer: No Typology Code available for payment source | Admitting: Obstetrics & Gynecology

## 2018-02-07 ENCOUNTER — Encounter: Payer: Self-pay | Admitting: Obstetrics & Gynecology

## 2018-02-07 VITALS — BP 130/86 | HR 84 | Ht 60.0 in | Wt 164.0 lb

## 2018-02-07 DIAGNOSIS — Z30430 Encounter for insertion of intrauterine contraceptive device: Secondary | ICD-10-CM

## 2018-02-07 DIAGNOSIS — N920 Excessive and frequent menstruation with regular cycle: Secondary | ICD-10-CM | POA: Diagnosis not present

## 2018-02-07 DIAGNOSIS — G43829 Menstrual migraine, not intractable, without status migrainosus: Secondary | ICD-10-CM

## 2018-02-07 NOTE — Progress Notes (Signed)
reer

## 2018-02-07 NOTE — Patient Instructions (Signed)
Endometrial Ablation Endometrial ablation is a procedure that destroys the thin inner layer of the lining of the uterus (endometrium). This procedure may be done:  To stop heavy periods.  To stop bleeding that is causing anemia.  To control irregular bleeding.  To treat bleeding caused by small tumors (fibroids) in the endometrium.  This procedure is often an alternative to major surgery, such as removal of the uterus and cervix (hysterectomy). As a result of this procedure:  You may not be able to have children. However, if you are premenopausal (you have not gone through menopause): ? You may still have a small chance of getting pregnant. ? You will need to use a reliable method of birth control after the procedure to prevent pregnancy.  You may stop having a menstrual period, or you may have only a small amount of bleeding during your period. Menstruation may return several years after the procedure.  Tell a health care provider about:  Any allergies you have.  All medicines you are taking, including vitamins, herbs, eye drops, creams, and over-the-counter medicines.  Any problems you or family members have had with the use of anesthetic medicines.  Any blood disorders you have.  Any surgeries you have had.  Any medical conditions you have. What are the risks? Generally, this is a safe procedure. However, problems may occur, including:  A hole (perforation) in the uterus or bowel.  Infection of the uterus, bladder, or vagina.  Bleeding.  Damage to other structures or organs.  An air bubble in the lung (air embolus).  Problems with pregnancy after the procedure.  Failure of the procedure.  Decreased ability to diagnose cancer in the endometrium.  What happens before the procedure?  You will have tests of your endometrium to make sure there are no pre-cancerous cells or cancer cells present.  You may have an ultrasound of the uterus.  You may be given  medicines to thin the endometrium.  Ask your health care provider about: ? Changing or stopping your regular medicines. This is especially important if you take diabetes medicines or blood thinners. ? Taking medicines such as aspirin and ibuprofen. These medicines can thin your blood. Do not take these medicines before your procedure if your doctor tells you not to.  Plan to have someone take you home from the hospital or clinic. What happens during the procedure?  You will lie on an exam table with your feet and legs supported as in a pelvic exam.  To lower your risk of infection: ? Your health care team will wash or sanitize their hands and put on germ-free (sterile) gloves. ? Your genital area will be washed with soap.  An IV tube will be inserted into one of your veins.  You will be given a medicine to help you relax (sedative).  A surgical instrument with a light and camera (resectoscope) will be inserted into your vagina and moved into your uterus. This allows your surgeon to see inside your uterus.  Endometrial tissue will be removed using one of the following methods: ? Radiofrequency. This method uses a radiofrequency-alternating electric current to remove the endometrium. ? Cryotherapy. This method uses extreme cold to freeze the endometrium. ? Heated-free liquid. This method uses a heated saltwater (saline) solution to remove the endometrium. ? Microwave. This method uses high-energy microwaves to heat up the endometrium and remove it. ? Thermal balloon. This method involves inserting a catheter with a balloon tip into the uterus. The balloon tip is   filled with heated fluid to remove the endometrium. The procedure may vary among health care providers and hospitals. What happens after the procedure?  Your blood pressure, heart rate, breathing rate, and blood oxygen level will be monitored until the medicines you were given have worn off.  As tissue healing occurs, you may  notice vaginal bleeding for 4-6 weeks after the procedure. You may also experience: ? Cramps. ? Thin, watery vaginal discharge that is light pink or brown in color. ? A need to urinate more frequently than usual. ? Nausea.  Do not drive for 24 hours if you were given a sedative.  Do not have sex or insert anything into your vagina until your health care provider approves. Summary  Endometrial ablation is done to treat the many causes of heavy menstrual bleeding.  The procedure may be done only after medications have been tried to control the bleeding.  Plan to have someone take you home from the hospital or clinic. This information is not intended to replace advice given to you by your health care provider. Make sure you discuss any questions you have with your health care provider. Document Released: 09/15/2004 Document Revised: 11/23/2016 Document Reviewed: 11/23/2016 Elsevier Interactive Patient Education  2017 [employer]vier Inc.  

## 2018-02-07 NOTE — Progress Notes (Signed)
HPI:      Ms. KAYLANI FROMME is a 42 y.o. G3P3000 who LMP was No LMP recorded., presents today for a problem visit.  She complains of menorrhagia and menstrual migraine headaches associated with photophobia, nausea, and vomiting that  began 2 years ago and its severity is described as severe.  She has regular periods every 28 days and they are associated with moderate menstrual cramping.  She has used the following for attempts at control: maxi pad.  Headaches and its sx's always associated w period  Previous evaluation: years ago (> 10). Prior Diagnosis: dysfunctional uterine bleeding. Previous Treatment: Cryo Ablation then.  She is single partner, contraception - tubal ligation.  She is premenopausal.  PMHx: She  has a past medical history of Allergy, Aneurysm ([place]), Anxiety, Depression, Dermatofibroma, Diabetes mellitus without complication ([place]), Facial pain, GERD (gastroesophageal reflux disease), Headache, Hyperlipidemia, IFG (impaired fasting glucose), Low HDL (under 40), Overweight, Patella-femoral syndrome, Pinguecula, Thyroid nodule (april 2016), and Vitamin D deficiency disease. Also,  has a past surgical history that includes Tubal ligation; Ganglion cyst excision (Left); Carpal tunnel release; Nasal sinus surgery (Nov 2015); IR 3D Independent Wkst (12/04/2017); IR ANGIO VERTEBRAL SEL VERTEBRAL BILAT MOD SED (12/04/2017); and IR ANGIO INTRA EXTRACRAN SEL INTERNAL CAROTID BILAT MOD SED (12/04/2017)., family history includes Aneurysm in her father; Diabetes in her father and paternal grandfather; Heart disease in her father; Hypertension in her father; Kidney disease in her father; Stroke in her father.,  reports that she has never smoked. She has never used smokeless tobacco. She reports that she drank alcohol. She reports that she does not use drugs.  She  Current Outpatient Medications:  .  acetaminophen (TYLENOL) 500 MG tablet, Take 1,000 mg by mouth daily as needed for moderate pain or  headache., Disp: , Rfl:  .  amoxicillin-clavulanate (AUGMENTIN) 875-125 MG tablet, Take 1 tablet by mouth 2 (two) times daily., Disp: 14 tablet, Rfl: 0 .  Cholecalciferol (VITAMIN D) 2000 UNITS CAPS, Take 2,000 Units by mouth daily. , Disp: , Rfl:  .  cyanocobalamin 1000 MCG tablet, Take 1,000 mcg by mouth daily. , Disp: , Rfl:  .  fluticasone (FLONASE) 50 MCG/ACT nasal spray, Place 2 sprays into both nostrils daily., Disp: 16 g, Rfl: 12 .  glucose blood (BAYER CONTOUR TEST) test strip, 1 each by Other route as needed for other. Use as instructed, Disp: , Rfl:  .  ibuprofen (ADVIL,MOTRIN) 200 MG tablet, Take 800 mg by mouth daily as needed for headache or moderate pain., Disp: , Rfl:  .  loratadine (CLARITIN) 10 MG tablet, Take 10 mg by mouth daily. , Disp: , Rfl:  .  metFORMIN (GLUCOPHAGE XR) 500 MG 24 hr tablet, Take 2 tablets (1,000 mg total) by mouth every evening., Disp: 180 tablet, Rfl: 1 .  aspirin EC 325 MG tablet, Take 325 mg by mouth daily. Start 7 days prior to procedure, Disp: , Rfl:  .  busPIRone (BUSPAR) 5 MG tablet, Take 1-3 tablets (5-15 mg total) by mouth 3 (three) times daily. (Patient not taking: Reported on 02/07/2018), Disp: 360 tablet, Rfl: 1 .  clopidogrel (PLAVIX) 75 MG tablet, Take 75 mg by mouth daily. Start 7 days prior to procedure, Disp: , Rfl: 2 .  nortriptyline (PAMELOR) 50 MG capsule, Take 1 capsule (50 mg total) by mouth at bedtime. (Patient not taking: Reported on 02/07/2018), Disp: 90 capsule, Rfl: 1 .  venlafaxine XR (EFFEXOR-XR) 75 MG 24 hr capsule, Take 1 capsule (75 mg  total) by mouth daily with breakfast. (Patient not taking: Reported on 02/07/2018), Disp: 30 capsule, Rfl: 3  Also, is allergic to wellbutrin [bupropion].  Review of Systems  Constitutional: Negative for chills, fever and malaise/fatigue.  HENT: Negative for congestion, sinus pain and sore throat.   Eyes: Negative for blurred vision and pain.  Respiratory: Negative for cough and wheezing.     Cardiovascular: Negative for chest pain and leg swelling.  Gastrointestinal: Negative for abdominal pain, constipation, diarrhea, heartburn, nausea and vomiting.  Genitourinary: Negative for dysuria, frequency, hematuria and urgency.  Musculoskeletal: Negative for back pain, joint pain, myalgias and neck pain.  Skin: Negative for itching and rash.  Neurological: Negative for dizziness, tremors and weakness.  Endo/Heme/Allergies: Does not bruise/bleed easily.  Psychiatric/Behavioral: Negative for depression. The patient is not nervous/anxious and does not have insomnia.     Objective: BP 130/86   Pulse 84   Ht 5' (1.524 m)   Wt 164 lb (74.4 kg)   BMI 32.03 kg/m  Physical Exam  Constitutional: She is oriented to person, place, and time. She appears well-developed and well-nourished. No distress.  Musculoskeletal: Normal range of motion.  Neurological: She is alert and oriented to person, place, and time.  Skin: Skin is warm and dry.  Psychiatric: She has a normal mood and affect.  Vitals reviewed.   ASSESSMENT/PLAN:  dysfunctional uterine bleeding  Visit Diagnoses    Menorrhagia with regular cycle    -  Primary   Menstrual migraine without status migrainosus, not intractable        Plan [place], EMB, PAP soon Patient has abnormal uterine bleeding . She has a normal exam today, with no evidence of lesions.  Evaluation includes the following: exam, labs such as hormonal testing, and pelvic ultrasound to evaluate for any structural gynecologic abnormalities.  Patient to follow up after testing.  Treatment option for menorrhagia or menometrorrhagia discussed in great detail with the patient.  Options include hormonal therapy, IUD therapy such as Mirena, D&C, Ablation, and Hysterectomy.  The pros and cons of each option discussed with patient.  Pt desires repeat (newer) ablation, instead of hysterectomy or hormonal options. As it has been many years since cryo ablation and cryo worked for  a while, it is reasonable to repeat the ablation.  Barnett Applebaum, MD, [facility], [facility] 02/07/2018  4:54 PM

## 2018-02-08 ENCOUNTER — Ambulatory Visit (INDEPENDENT_AMBULATORY_CARE_PROVIDER_SITE_OTHER): Payer: No Typology Code available for payment source | Admitting: Obstetrics & Gynecology

## 2018-02-08 ENCOUNTER — Other Ambulatory Visit: Payer: Self-pay | Admitting: Obstetrics & Gynecology

## 2018-02-08 ENCOUNTER — Encounter: Payer: Self-pay | Admitting: Obstetrics & Gynecology

## 2018-02-08 ENCOUNTER — Ambulatory Visit (INDEPENDENT_AMBULATORY_CARE_PROVIDER_SITE_OTHER): Payer: No Typology Code available for payment source

## 2018-02-08 VITALS — BP 130/90 | HR 84 | Ht 60.0 in | Wt 162.0 lb

## 2018-02-08 DIAGNOSIS — N920 Excessive and frequent menstruation with regular cycle: Secondary | ICD-10-CM | POA: Diagnosis not present

## 2018-02-08 DIAGNOSIS — G43829 Menstrual migraine, not intractable, without status migrainosus: Secondary | ICD-10-CM | POA: Diagnosis not present

## 2018-02-08 DIAGNOSIS — D219 Benign neoplasm of connective and other soft tissue, unspecified: Secondary | ICD-10-CM

## 2018-02-08 DIAGNOSIS — Z124 Encounter for screening for malignant neoplasm of cervix: Secondary | ICD-10-CM | POA: Diagnosis not present

## 2018-02-08 NOTE — Progress Notes (Signed)
  HPI: Pt has worsening periods and menstrual migraines, as discussed yesterday.  Today she has [place] along w EMB and needed PAP.  Ultrasound demonstrates fibroids (4 and 2 cmm IM).  ES 10 mm. These findings are Pelvis abnormal w new findings of fibroids  PMHx: She  has a past medical history of Allergy, Aneurysm ([place]), Anxiety, Depression, Dermatofibroma, Diabetes mellitus without complication ([place]), Facial pain, GERD (gastroesophageal reflux disease), Headache, Hyperlipidemia, IFG (impaired fasting glucose), Low HDL (under 40), Overweight, Patella-femoral syndrome, Pinguecula, Thyroid nodule (april 2016), and Vitamin D deficiency disease. Also,  has a past surgical history that includes Tubal ligation; Ganglion cyst excision (Left); Carpal tunnel release; Nasal sinus surgery (Nov 2015); IR 3D Independent Wkst (12/04/2017); IR ANGIO VERTEBRAL SEL VERTEBRAL BILAT MOD SED (12/04/2017); and IR ANGIO INTRA EXTRACRAN SEL INTERNAL CAROTID BILAT MOD SED (12/04/2017)., family history includes Aneurysm in her father; Diabetes in her father and paternal grandfather; Heart disease in her father; Hypertension in her father; Kidney disease in her father; Stroke in her father.,  reports that she has never smoked. She has never used smokeless tobacco. She reports that she drank alcohol. She reports that she does not use drugs.  She has a current medication list which includes the following prescription(s): acetaminophen, amoxicillin-clavulanate, aspirin ec, buspirone, vitamin d, clopidogrel, cyanocobalamin, fluticasone, glucose blood, ibuprofen, loratadine, metformin, nortriptyline, and venlafaxine xr. Also, is allergic to wellbutrin [bupropion].  Review of Systems  All other systems reviewed and are negative.  Objective: BP 130/90   Pulse 84   Ht 5' (1.524 m)   Wt 162 lb (73.5 kg)   BMI 31.64 kg/m   Physical examination Constitutional NAD, Conversant  Skin No rashes, lesions or ulceration.   Extremities: Moves  all appropriately.  Normal ROM for age. No lymphadenopathy.  Neuro: Grossly intact  Psych: Oriented to PPT.  Normal mood. Normal affect.     Assessment:  Fibroid  Menstrual migraine without status migrainosus, not intractable Menorrhagia with regular cycle - Plan: Pathology for EMB Screening for cervical cancer - Plan: IGP, Aptima HPV  Fibroid treatment such as [place], Lupron, Myomectomy, and Hysterectomy discussed in detail, with the pros and cons of each choice counseled.  No treatment as an option also discussed, as well as control of symptoms alone with hormone therapy. Information provided to the patient.  Considering hysterectomy; preservation of ovaries, laparoscopy.  Barnett Applebaum, MD, [facility], [facility] 02/08/2018  9:07 AM

## 2018-02-08 NOTE — Patient Instructions (Signed)
Total Laparoscopic Hysterectomy °A total laparoscopic hysterectomy is a minimally invasive surgery to remove your uterus and cervix. This surgery is performed by making several small cuts (incisions) in your abdomen. It can also be done with a thin, lighted tube (laparoscope) inserted into two small incisions in your lower abdomen. Your fallopian tubes and ovaries can be removed (bilateral salpingo-oophorectomy) during this surgery as well. Benefits of minimally invasive surgery include: °· Less pain. °· Less risk of blood loss. °· Less risk of infection. °· Quicker return to normal activities. ° °Tell a health care provider about: °· Any allergies you have. °· All medicines you are taking, including vitamins, herbs, eye drops, creams, and over-the-counter medicines. °· Any problems you or family members have had with anesthetic medicines. °· Any blood disorders you have. °· Any surgeries you have had. °· Any medical conditions you have. °What are the risks? °Generally, this is a safe procedure. However, as with any procedure, complications can occur. Possible complications include: °· Bleeding. °· Blood clots in the legs or lung. °· Infection. °· Injury to surrounding organs. °· Problems with anesthesia. °· Early menopause symptoms (hot flashes, night sweats, insomnia). °· Risk of conversion to an open abdominal incision. ° °What happens before the procedure? °· Ask your health care provider about changing or stopping your regular medicines. °· Do not take aspirin or blood thinners (anticoagulants) for 1 week before the surgery or as told by your health care provider. °· Do not eat or drink anything for 8 hours before the surgery or as told by your health care provider. °· Quit smoking if you smoke. °· Arrange for a ride home after surgery and for someone to help you at home during recovery. °What happens during the procedure? °· You will be given antibiotic medicine. °· An IV tube will be placed in your arm. You  will be given medicine to make you sleep (general anesthetic). °· A gas (carbon dioxide) will be used to inflate your abdomen. This will allow your surgeon to look inside your abdomen, perform your surgery, and treat any other problems found if necessary. °· Three or four small incisions (often less than 1/2 inch) will be made in your abdomen. One of these incisions will be made in the area of your belly button (navel). The laparoscope will be inserted into the incision. Your surgeon will look through the laparoscope while doing your procedure. °· Other surgical instruments will be inserted through the other incisions. °· Your uterus may be removed through your vagina or cut into small pieces and removed through the small incisions. °· Your incisions will be closed. °What happens after the procedure? °· The gas will be released from inside your abdomen. °· You will be taken to the recovery area where a nurse will watch and check your progress. Once you are awake, stable, and taking fluids well, without other problems, you will return to your room or be allowed to go home. °· There is usually minimal discomfort following the surgery because the incisions are so small. °· You will be given pain medicine while you are in the hospital and for when you go home. °This information is not intended to replace advice given to you by your health care provider. Make sure you discuss any questions you have with your health care provider. °Document Released: 09/03/2007 Document Revised: 04/13/2016 Document Reviewed: 05/27/2013 °Elsevier Interactive Patient Education © 2017 [employer] Inc. ° °

## 2018-02-12 LAB — PATHOLOGY

## 2018-02-13 NOTE — Progress Notes (Signed)
Discussed results w patient.  Fibroids, menorrhagia and menstrual migraines- she desires hysterectomy.  She is having a procedure for aneurysm, and will plan hysterectomy based on their rec's (befor after, Plavix pre but not post procedure, they planning in June, could we do before?)  Will call back to schedule

## 2018-02-14 LAB — IGP, APTIMA HPV
HPV Aptima: NEGATIVE
PAP Smear Comment: 0

## 2018-02-15 ENCOUNTER — Other Ambulatory Visit: Payer: Self-pay | Admitting: Neurosurgery

## 2018-02-20 ENCOUNTER — Other Ambulatory Visit: Payer: Self-pay | Admitting: Family Medicine

## 2018-02-22 ENCOUNTER — Encounter: Payer: Self-pay | Admitting: Family Medicine

## 2018-02-22 ENCOUNTER — Ambulatory Visit (INDEPENDENT_AMBULATORY_CARE_PROVIDER_SITE_OTHER): Payer: No Typology Code available for payment source | Admitting: Family Medicine

## 2018-02-22 VITALS — BP 115/81 | HR 73 | Temp 98.2°F | Ht 60.0 in | Wt 161.3 lb

## 2018-02-22 DIAGNOSIS — M779 Enthesopathy, unspecified: Secondary | ICD-10-CM

## 2018-02-22 MED ORDER — PREDNISONE 10 MG PO TABS: ORAL_TABLET | ORAL | 0 refills | Status: DC

## 2018-02-22 NOTE — Progress Notes (Signed)
BP 115/81 (BP Location: Right Arm, Patient Position: Sitting, Cuff Size: Normal)   Pulse 73   Temp 98.2 F (36.8 C) (Oral)   Ht 5' (1.524 m)   Wt 161 lb 4.8 oz (73.2 kg)   SpO2 100%   BMI 31.50 kg/m    Subjective:    Patient ID: Ashley Wiley, female    DOB: Jan 25, 1976, 42 y.o.   MRN: [id number]  HPI: Ashley Wiley is a 42 y.o. female  Chief Complaint  Patient presents with  . Hand Pain    Bilateral. Palm and knuckle area, pain radiates to elbow. Ongoing for 2 weeks.    2 weeks of b/l hand pain that goes across knuckles and wrists, radiating from elbow at times. Cold temperatures seem to make things worse, as well as movement from the elbows. Taking tylenol and ibuprofen with no relief, and has been wearing night braces and doing epsom salt soaks which doesn't seem to help either. Denies numbness, tingling, loss of strength in hands, known injury or triggers. Has had carpal tunnel in the past, states this does not feel similar. Has flexeril at home but hasn't been taking it.   Relevant past medical, surgical, family and social history reviewed and updated as indicated. Interim medical history since our last visit reviewed. Allergies and medications reviewed and updated.  Review of Systems  Per HPI unless specifically indicated above     Objective:    BP 115/81 (BP Location: Right Arm, Patient Position: Sitting, Cuff Size: Normal)   Pulse 73   Temp 98.2 F (36.8 C) (Oral)   Ht 5' (1.524 m)   Wt 161 lb 4.8 oz (73.2 kg)   SpO2 100%   BMI 31.50 kg/m   Wt Readings from Last 3 Encounters:  02/22/18 161 lb 4.8 oz (73.2 kg)  02/08/18 162 lb (73.5 kg)  02/07/18 164 lb (74.4 kg)    Physical Exam  Constitutional: She is oriented to person, place, and time. She appears well-developed and well-nourished. No distress.  HENT:  Head: Atraumatic.  Eyes: Conjunctivae are normal.  Neck: Normal range of motion. Neck supple.  Pulmonary/Chest: Effort normal and breath sounds  normal. No respiratory distress.  Musculoskeletal: Normal range of motion. She exhibits tenderness (TTP at b/l elbows and wrists). She exhibits no edema or deformity.  Grips strength full and equal b/l hands Knuckles and wrists non-tender, good ROM at all joints b/l UEs  Neurological: She is alert and oriented to person, place, and time.  Skin: Skin is warm and dry. No erythema.  Psychiatric: She has a normal mood and affect. Her behavior is normal.  Nursing note and vitals reviewed.   Results for orders placed or performed in visit on 02/08/18  IGP, Aptima HPV  Result Value Ref Range   DIAGNOSIS: Comment    Specimen adequacy: Comment    Clinician Provided ICD10 Comment    Performed by: Comment    PAP Smear Comment .    Note: Comment    Test Methodology Comment    HPV Aptima Negative Negative  Pathology  Result Value Ref Range   . Comment    . Comment    . Comment    . Comment    . Comment    . Comment       Assessment & Plan:   Problem List Items Addressed This Visit    None    Visit Diagnoses    Tendonitis    -  Primary  Suspect inflammatory/tendonitis coming from elbows with possibly some arthritis in hands. Prednisone, flexeril, continue OTC pain relievers and soaks    Pt interested at next visit in some arthritis labs to r/o any underlying issues. Will consider x-rays additionally if still no improvement   Follow up plan: Return for CPE as scheduled.

## 2018-02-25 NOTE — Pre-Procedure Instructions (Signed)
Ashley Wiley  02/25/2018      [place], [place] [place] [address] Phone: [phone] Fax: [phone]  [facility] [address], [place] - [address] [place] [address] Phone: [phone] Fax: [phone]    Your procedure is scheduled on  Tuesday  03/05/18  Report to [facility] Admitting at 630 A.M.  Call this number if you have problems the morning of surgery:  [phone]   Remember:  Do not eat food or drink liquids after midnight.  Take these medicines the morning of surgery with A SIP OF WATER - LORATADINE, PREDNISONE   7 days prior to surgery STOP taking any Aspirin(unless otherwise instructed by your surgeon), Aleve, Naproxen, Ibuprofen, Motrin, Advil, Goody's, BC's, all herbal medications, fish oil, and all vitamins    How to Manage Your Diabetes Before and After Surgery  Why is it important to control my blood sugar before and after surgery? . Improving blood sugar levels before and after surgery helps healing and can limit problems. . A way of improving blood sugar control is eating a healthy diet by: o  Eating less sugar and carbohydrates o  Increasing activity/exercise o  Talking with your doctor about reaching your blood sugar goals . High blood sugars (greater than 180 mg/dL) can raise your risk of infections and slow your recovery, so you will need to focus on controlling your diabetes during the weeks before surgery. . Make sure that the doctor who takes care of your diabetes knows about your planned surgery including the date and location.  How do I manage my blood sugar before surgery? . Check your blood sugar at least 4 times a day, starting 2 days before surgery, to make sure that the level is not too high or low. o Check your blood sugar the morning of your surgery when you wake up and every 2 hours until you get to the  Short Stay unit. . If your blood sugar is less than 70 mg/dL, you will need to treat for low blood sugar: o Do not take insulin. o Treat a low blood sugar (less than 70 mg/dL) with  cup of clear juice (cranberry or apple), 4 glucose tablets, OR glucose gel. Recheck blood sugar in 15 minutes after treatment (to make sure it is greater than 70 mg/dL). If your blood sugar is not greater than 70 mg/dL on recheck, call [phone] o  for further instructions. . Report your blood sugar to the short stay nurse when you get to Short Stay.  . If you are admitted to the hospital after surgery: o Your blood sugar will be checked by the staff and you will probably be given insulin after surgery (instead of oral diabetes medicines) to make sure you have good blood sugar levels. o The goal for blood sugar control after surgery is 80-180 mg/dL.              WHAT DO I DO ABOUT MY DIABETES MEDICATION?   Marland Kitchen Do not take oral diabetes medicines (pills) the morning of surgery. .       .  .   Other Instructions:          Patient Signature:  Date:   Nurse Signature:  Date:   Reviewed and Endorsed by [facility] Patient Education Committee, August 2015  Do not wear jewelry, make-up or nail polish.  Do not wear lotions, powders, or perfumes, or deodorant.  Do not shave 48 hours prior to surgery.  Men may shave face and neck.  Do not bring valuables to the hospital.  [facility] is not responsible for any belongings or valuables.  Contacts, dentures or bridgework may not be worn into surgery.  Leave your suitcase in the car.  After surgery it may be brought to your room.  For patients admitted to the hospital, discharge time will be determined by your treatment team.  Patients discharged the day of surgery will not be allowed to drive home.   Name and phone number of your driver:    Special instructions:  [place] - Preparing for Surgery  Before surgery, you can play an  important role.  Because skin is not sterile, your skin needs to be as free of germs as possible.  You can reduce the number of germs on you skin by washing with CHG (chlorahexidine gluconate) soap before surgery.  CHG is an antiseptic cleaner which kills germs and bonds with the skin to continue killing germs even after washing.  Please DO NOT use if you have an allergy to CHG or antibacterial soaps.  If your skin becomes reddened/irritated stop using the CHG and inform your nurse when you arrive at Short Stay.  Do not shave (including legs and underarms) for at least 48 hours prior to the first CHG shower.  You may shave your face.  Please follow these instructions carefully:   1.  Shower with CHG Soap the night before surgery and the                                morning of Surgery.  2.  If you choose to wash your hair, wash your hair first as usual with your       normal shampoo.  3.  After you shampoo, rinse your hair and body thoroughly to remove the                      Shampoo.  4.  Use CHG as you would any other liquid soap.  You can apply chg directly       to the skin and wash gently with scrungie or a clean washcloth.  5.  Apply the CHG Soap to your body ONLY FROM THE NECK DOWN.        Do not use on open wounds or open sores.  Avoid contact with your eyes,       ears, mouth and genitals (private parts).  Wash genitals (private parts)       with your normal soap.  6.  Wash thoroughly, paying special attention to the area where your surgery        will be performed.  7.  Thoroughly rinse your body with warm water from the neck down.  8.  DO NOT shower/wash with your normal soap after using and rinsing off       the CHG Soap.  9.  Pat yourself dry with a clean towel.            10.  Wear clean pajamas.            11.  Place clean sheets on your bed the night of your first shower and do not        sleep with pets.  Day of Surgery  Do  not apply any lotions/deoderants the morning of  surgery.  Please wear clean clothes to the hospital/surgery center.     Please read over the following fact sheets that you were given. MRSA Information and Surgical Site Infection Prevention

## 2018-02-25 NOTE — Patient Instructions (Signed)
Follow up for CPE as scheduled

## 2018-02-26 ENCOUNTER — Encounter (HOSPITAL_COMMUNITY): Payer: Self-pay

## 2018-02-26 ENCOUNTER — Encounter (HOSPITAL_COMMUNITY)
Admission: RE | Admit: 2018-02-26 | Discharge: 2018-02-26 | Disposition: A | Discharge status: Home / Self Care | Payer: No Typology Code available for payment source | Source: Ambulatory Visit | Attending: Neurosurgery | Admitting: Neurosurgery

## 2018-02-26 ENCOUNTER — Other Ambulatory Visit: Payer: Self-pay

## 2018-02-26 DIAGNOSIS — Z01818 Encounter for other preprocedural examination: Secondary | ICD-10-CM | POA: Insufficient documentation

## 2018-02-26 DIAGNOSIS — Z01812 Encounter for preprocedural laboratory examination: Secondary | ICD-10-CM | POA: Diagnosis not present

## 2018-02-26 LAB — CBC WITH DIFFERENTIAL/PLATELET
BASOS ABS: 0 10*3/uL (ref 0.0–0.1)
Basophils Relative: 0 %
EOS ABS: 0.1 10*3/uL (ref 0.0–0.7)
EOS PCT: 1 %
HCT: 39 % (ref 36.0–46.0)
Hemoglobin: 13.2 g/dL (ref 12.0–15.0)
Lymphocytes Relative: 36 %
Lymphs Abs: 4.1 10*3/uL — ABNORMAL HIGH (ref 0.7–4.0)
MCH: 30.5 pg (ref 26.0–34.0)
MCHC: 33.8 g/dL (ref 30.0–36.0)
MCV: 90.1 fL (ref 78.0–100.0)
Monocytes Absolute: 0.6 10*3/uL (ref 0.1–1.0)
Monocytes Relative: 5 %
Neutro Abs: 6.7 10*3/uL (ref 1.7–7.7)
Neutrophils Relative %: 58 %
PLATELETS: 228 10*3/uL (ref 150–400)
RBC: 4.33 MIL/uL (ref 3.87–5.11)
RDW: 12.7 % (ref 11.5–15.5)
WBC: 11.6 10*3/uL — AB (ref 4.0–10.5)

## 2018-02-26 LAB — URINALYSIS, ROUTINE W REFLEX MICROSCOPIC
BACTERIA UA: NONE SEEN
Bilirubin Urine: NEGATIVE
Glucose, UA: NEGATIVE mg/dL
Ketones, ur: NEGATIVE mg/dL
Leukocytes, UA: NEGATIVE
NITRITE: NEGATIVE
Protein, ur: NEGATIVE mg/dL
RBC / HPF: NONE SEEN RBC/hpf (ref 0–5)
Specific Gravity, Urine: 1.011 (ref 1.005–1.030)
pH: 6 (ref 5.0–8.0)

## 2018-02-26 LAB — BASIC METABOLIC PANEL
Anion gap: 9 (ref 5–15)
BUN: 12 mg/dL (ref 6–20)
CO2: 29 mmol/L (ref 22–32)
CREATININE: 0.68 mg/dL (ref 0.44–1.00)
Calcium: 9.7 mg/dL (ref 8.9–10.3)
Chloride: 97 mmol/L — ABNORMAL LOW (ref 101–111)
GFR calc Af Amer: >60 mL/min (ref >60)
Glucose, Bld: 112 mg/dL — ABNORMAL HIGH (ref 65–99)
Potassium: 3.5 mmol/L (ref 3.5–5.1)
SODIUM: 135 mmol/L (ref 135–145)

## 2018-02-26 LAB — PROTIME-INR
INR: 0.91
Prothrombin Time: 12.1 seconds (ref 11.4–15.2)

## 2018-02-26 LAB — APTT: aPTT: 27 seconds (ref 24–36)

## 2018-02-26 LAB — GLUCOSE, CAPILLARY: Glucose-Capillary: 134 mg/dL — ABNORMAL HIGH (ref 65–99)

## 2018-02-26 NOTE — Pre-Procedure Instructions (Signed)
Ashley Wiley  02/26/2018      [place], [place] [address] Phone: [phone] Fax: [phone]  [facility] [address] [place] - [place] [address] Phone: [phone] Fax: [phone]    Your procedure is scheduled on  Tuesday  03/05/18  Report to [facility] Admitting at 630 A.M.  Call this number if you have problems the morning of surgery:  [phone]   Remember:  Do not eat food or drink liquids after midnight.  Take these medicines the morning of surgery with A SIP OF WATER - LORATADINE, PREDNISONE, flonase if needed  FOLLOW your surgeon's instructions on taking Aspirin and Plavix  DO NOT take Metformin (Glucophage) the day of surgery  7 days prior to surgery STOP taking any Aspirin(unless otherwise instructed by your surgeon), Aleve, Naproxen, Ibuprofen, Motrin, Advil, Goody's, BC's, all herbal medications, fish oil, and all vitamins    How to Manage Your Diabetes Before and After Surgery  Why is it important to control my blood sugar before and after surgery? . Improving blood sugar levels before and after surgery helps healing and can limit problems. . A way of improving blood sugar control is eating a healthy diet by: o  Eating less sugar and carbohydrates o  Increasing activity/exercise o  Talking with your doctor about reaching your blood sugar goals . High blood sugars (greater than 180 mg/dL) can raise your risk of infections and slow your recovery, so you will need to focus on controlling your diabetes during the weeks before surgery. . Make sure that the doctor who takes care of your diabetes knows about your planned surgery including the date and location.  How do I manage my blood sugar before surgery? . Check your blood sugar at least 4 times a day, starting 2 days before surgery, to make sure that the  level is not too high or low. o Check your blood sugar the morning of your surgery when you wake up and every 2 hours until you get to the Short Stay unit. . If your blood sugar is less than 70 mg/dL, you will need to treat for low blood sugar: o Do not take insulin. o Treat a low blood sugar (less than 70 mg/dL) with  cup of clear juice (cranberry or apple), 4 glucose tablets, OR glucose gel. Recheck blood sugar in 15 minutes after treatment (to make sure it is greater than 70 mg/dL). If your blood sugar is not greater than 70 mg/dL on recheck, call [phone] o  for further instructions. . Report your blood sugar to the short stay nurse when you get to Short Stay.  . If you are admitted to the hospital after surgery: o Your blood sugar will be checked by the staff and you will probably be given insulin after surgery (instead of oral diabetes medicines) to make sure you have good blood sugar levels. o The goal for blood sugar control after surgery is 80-180 mg/dL.              Do not wear jewelry, make-up or nail polish.  Do not wear lotions, powders, or perfumes, or deodorant.  Do not shave 48 hours prior to surgery.  Men may shave face and neck.  Do not bring valuables to the hospital.  [facility] is not responsible for any belongings or valuables.  Contacts, dentures  or bridgework may not be worn into surgery.  Leave your suitcase in the car.  After surgery it may be brought to your room.  For patients admitted to the hospital, discharge time will be determined by your treatment team.  Patients discharged the day of surgery will not be allowed to drive home.   Name and phone number of your driver:    Special instructions:     [place]- Preparing For Surgery  Before surgery, you can play an important role. Because skin is not sterile, your skin needs to be as free of germs as possible. You can reduce the number of germs on your skin by washing with CHG  (chlorahexidine gluconate) Soap before surgery.  CHG is an antiseptic cleaner which kills germs and bonds with the skin to continue killing germs even after washing.  Please do not use if you have an allergy to CHG or antibacterial soaps. If your skin becomes reddened/irritated stop using the CHG.  Do not shave (including legs and underarms) for at least 48 hours prior to first CHG shower. It is OK to shave your face.  Please follow these instructions carefully.   1. Shower the NIGHT BEFORE SURGERY and the MORNING OF SURGERY with CHG.   2. If you chose to wash your hair, wash your hair first as usual with your normal shampoo.  3. After you shampoo, rinse your hair and body thoroughly to remove the shampoo.  4. Use CHG as you would any other liquid soap. You can apply CHG directly to the skin and wash gently with a scrungie or a clean washcloth.   5. Apply the CHG Soap to your body ONLY FROM THE NECK DOWN.  Do not use on open wounds or open sores. Avoid contact with your eyes, ears, mouth and genitals (private parts). Wash Face and genitals (private parts)  with your normal soap.  6. Wash thoroughly, paying special attention to the area where your surgery will be performed.  7. Thoroughly rinse your body with warm water from the neck down.  8. DO NOT shower/wash with your normal soap after using and rinsing off the CHG Soap.  9. Pat yourself dry with a CLEAN TOWEL.  10. Wear CLEAN PAJAMAS to bed the night before surgery, wear comfortable clothes the morning of surgery  11. Place CLEAN SHEETS on your bed the night of your first shower and DO NOT SLEEP WITH PETS.    Day of Surgery: Do not apply any deodorants/lotions. Please wear clean clothes to the hospital/surgery center.         Please read over the following fact sheets that you were given. MRSA Information and Surgical Site Infection Prevention

## 2018-03-04 ENCOUNTER — Ambulatory Visit
Admission: RE | Admit: 2018-03-04 | Discharge: 2018-03-04 | Disposition: A | Discharge status: Home / Self Care | Payer: No Typology Code available for payment source | Source: Ambulatory Visit | Attending: Family Medicine | Admitting: Family Medicine

## 2018-03-04 ENCOUNTER — Encounter: Payer: Self-pay | Admitting: Family Medicine

## 2018-03-04 ENCOUNTER — Ambulatory Visit
Admission: RE | Admit: 2018-03-04 | Payer: No Typology Code available for payment source | Source: Ambulatory Visit | Admitting: *Deleted

## 2018-03-04 ENCOUNTER — Ambulatory Visit (INDEPENDENT_AMBULATORY_CARE_PROVIDER_SITE_OTHER): Payer: No Typology Code available for payment source | Admitting: Family Medicine

## 2018-03-04 ENCOUNTER — Other Ambulatory Visit: Payer: Self-pay | Admitting: Physician Assistant

## 2018-03-04 VITALS — BP 119/80 | HR 77 | Ht 62.0 in | Wt 162.6 lb

## 2018-03-04 DIAGNOSIS — I671 Cerebral aneurysm, nonruptured: Secondary | ICD-10-CM | POA: Insufficient documentation

## 2018-03-04 DIAGNOSIS — M2550 Pain in unspecified joint: Secondary | ICD-10-CM

## 2018-03-04 MED ORDER — TRAMADOL HCL 50 MG PO TABS: 50.0000 mg | ORAL_TABLET | Freq: Three times a day (TID) | ORAL | 0 refills | Status: DC | PRN

## 2018-03-04 NOTE — Anesthesia Preprocedure Evaluation (Signed)
Anesthesia Evaluation  Patient identified by MRN, date of birth, ID band Patient awake    Reviewed: Allergy & Precautions, H&P , Patient's Chart, lab work & pertinent test results, reviewed documented beta blocker date and time   Airway Mallampati: II  TM Distance: >3 FB Neck ROM: full    Dental no notable dental hx.    Pulmonary    Pulmonary exam normal breath sounds clear to auscultation       Cardiovascular  Rhythm:regular Rate:Normal     Neuro/Psych    GI/Hepatic   Endo/Other  diabetes  Renal/GU      Musculoskeletal   Abdominal   Peds  Hematology   Anesthesia Other Findings   Reproductive/Obstetrics                             Anesthesia Physical Anesthesia Plan  ASA: II  Anesthesia Plan: General   Post-op Pain Management:    Induction: Intravenous  PONV Risk Score and Plan: 2 and Treatment may vary due to age or medical condition and Ondansetron  Airway Management Planned: Oral ETT  Additional Equipment:   Intra-op Plan:   Post-operative Plan: Extubation in OR  Informed Consent: I have reviewed the patients History and Physical, chart, labs and discussed the procedure including the risks, benefits and alternatives for the proposed anesthesia with the patient or authorized representative who has indicated his/her understanding and acceptance.   Dental Advisory Given  Plan Discussed with: CRNA and Surgeon  Anesthesia Plan Comments: (  )        Anesthesia Quick Evaluation

## 2018-03-04 NOTE — Patient Instructions (Signed)
Joint Pain Joint pain, which is also called arthralgia, can be caused by many things. Joint pain often goes away when you follow your health care provider's instructions for relieving pain at home. However, joint pain can also be caused by conditions that require further treatment. Common causes of joint pain include:  Bruising in the area of the joint.  Overuse of the joint.  Wear and tear on the joints that occur with aging (osteoarthritis).  Various other forms of arthritis.  A buildup of a crystal form of uric acid in the joint (gout).  Infections of the joint (septic arthritis) or of the bone (osteomyelitis).  Your health care provider may recommend medicine to help with the pain. If your joint pain continues, additional tests may be needed to diagnose your condition. Follow these instructions at home: Watch your condition for any changes. Follow these instructions as directed to lessen the pain that you are feeling.  Take medicines only as directed by your health care provider.  Rest the affected area for as long as your health care provider says that you should. If directed to do so, raise the painful joint above the level of your heart while you are sitting or lying down.  Do not do things that cause or worsen pain.  If directed, apply ice to the painful area: ? Put ice in a plastic bag. ? Place a towel between your skin and the bag. ? Leave the ice on for 20 minutes, 2-3 times per day.  Wear an elastic bandage, splint, or sling as directed by your health care provider. Loosen the elastic bandage or splint if your fingers or toes become numb and tingle, or if they turn cold and blue.  Begin exercising or stretching the affected area as directed by your health care provider. Ask your health care provider what types of exercise are safe for you.  Keep all follow-up visits as directed by your health care provider. This is important.  Contact a health care provider if:  Your  pain increases, and medicine does not help.  Your joint pain does not improve within 3 days.  You have increased bruising or swelling.  You have a fever.  You lose 10 lb (4.5 kg) or more without trying. Get help right away if:  You are not able to move the joint.  Your fingers or toes become numb or they turn cold and blue. This information is not intended to replace advice given to you by your health care provider. Make sure you discuss any questions you have with your health care provider. Document Released: 11/06/2005 Document Revised: 04/07/2016 Document Reviewed: 08/18/2014 Elsevier Interactive Patient Education  2018 [employer]  

## 2018-03-04 NOTE — Progress Notes (Signed)
BP 119/80   Pulse 77   Ht 5' 2"  (1.575 m)   Wt 162 lb 9.6 oz (73.8 kg)   LMP 02/22/2018   SpO2 98%   BMI 29.74 kg/m    Subjective:    Patient ID: Ashley Wiley, female    DOB: 1976/07/07, 42 y.o.   MRN: [id number]  HPI: Ashley Wiley is a 42 y.o. female  Chief Complaint  Patient presents with  . Follow-up    Hand/leg pain. Right. Better while on Prednisone. Pain returned once RX finished. Constant pain. Worse at night.    ARTHRALGIAS / JOINT ACHES- Was seen for this about 10 days ago. Started on 6 day taper of prednisone. Prednisone helped but hasn't made it go 100% away. Has been using braces and compression sleeves. Nothing seems to be helping. Having surgery for her aneurysm tomorrow, so currently can't take NSAIDs  Duration: 1 month Pain: yes Symmetric: no Severity: severe- keeping her up at night Quality: aching Frequency: constant Context:  stable Decreased function/range of motion: yes  Erythema: no Swelling: yes Heat or warmth: no Morning stiffness: yes Relief with NSAIDs?: no Treatments attempted:  rest, ice, heat, APAP and ibuprofen  Involved Joints:     Hands: yes bilateral R>L    Wrists: no      Elbows: no     Shoulders: no    Back: no     Hips: yes right    Knees: yes right    Ankles: yes right    Feet: yes right  Relevant past medical, surgical, family and social history reviewed and updated as indicated. Interim medical history since our last visit reviewed. Allergies and medications reviewed and updated.  Review of Systems  Constitutional: Negative.   Respiratory: Negative.   Cardiovascular: Negative.   Musculoskeletal: Positive for arthralgias, gait problem and myalgias. Negative for back pain, joint swelling, neck pain and neck stiffness.  Skin: Negative.   Neurological: Positive for weakness. Negative for dizziness, tremors, seizures, syncope, facial asymmetry, speech difficulty, light-headedness, numbness and headaches.    Psychiatric/Behavioral: Negative.     Per HPI unless specifically indicated above     Objective:    BP 119/80   Pulse 77   Ht 5' 2"  (1.575 m)   Wt 162 lb 9.6 oz (73.8 kg)   LMP 02/22/2018   SpO2 98%   BMI 29.74 kg/m   Wt Readings from Last 3 Encounters:  03/04/18 162 lb 9.6 oz (73.8 kg)  02/26/18 161 lb 9.6 oz (73.3 kg)  02/22/18 161 lb 4.8 oz (73.2 kg)    Physical Exam  Constitutional: She is oriented to person, place, and time. She appears well-developed and well-nourished. No distress.  HENT:  Head: Normocephalic and atraumatic.  Right Ear: Hearing normal.  Left Ear: Hearing normal.  Nose: Nose normal.  Eyes: Conjunctivae and lids are normal. Right eye exhibits no discharge. Left eye exhibits no discharge. No scleral icterus.  Cardiovascular: Normal rate, regular rhythm, normal heart sounds and intact distal pulses. Exam reveals no gallop and no friction rub.  No murmur heard. Pulmonary/Chest: Effort normal and breath sounds normal. No stridor. No respiratory distress. She has no wheezes. She has no rales. She exhibits no tenderness.  Musculoskeletal: She exhibits edema and tenderness. She exhibits no deformity.  Swelling between index and middle MCP on R hand, mild swelling of joints on the R hand, decreased grip strength  Neurological: She is alert and oriented to person, place, and time.  Skin:  Skin is intact. No rash noted. She is not diaphoretic.  Psychiatric: She has a normal mood and affect. Her speech is normal and behavior is normal. Judgment and thought content normal. Cognition and memory are normal.  Nursing note and vitals reviewed.   Results for orders placed or performed during the hospital encounter of 02/26/18  Glucose, capillary  Result Value Ref Range   Glucose-Capillary 134 (H) 65 - 99 mg/dL  APTT  Result Value Ref Range   aPTT 27 24 - 36 seconds  Basic metabolic panel  Result Value Ref Range   Sodium 135 135 - 145 mmol/L   Potassium 3.5 3.5  - 5.1 mmol/L   Chloride 97 (L) 101 - 111 mmol/L   CO2 29 22 - 32 mmol/L   Glucose, Bld 112 (H) 65 - 99 mg/dL   BUN 12 6 - 20 mg/dL   Creatinine, Ser 0.68 0.44 - 1.00 mg/dL   Calcium 9.7 8.9 - 10.3 mg/dL   GFR calc non Af Amer >60 >60 mL/min   GFR calc Af Amer >60 >60 mL/min   Anion gap 9 5 - 15  CBC WITH DIFFERENTIAL  Result Value Ref Range   WBC 11.6 (H) 4.0 - 10.5 K/uL   RBC 4.33 3.87 - 5.11 MIL/uL   Hemoglobin 13.2 12.0 - 15.0 g/dL   HCT 39.0 36.0 - 46.0 %   MCV 90.1 78.0 - 100.0 fL   MCH 30.5 26.0 - 34.0 pg   MCHC 33.8 30.0 - 36.0 g/dL   RDW 12.7 11.5 - 15.5 %   Platelets 228 150 - 400 K/uL   Neutrophils Relative % 58 %   Neutro Abs 6.7 1.7 - 7.7 K/uL   Lymphocytes Relative 36 %   Lymphs Abs 4.1 (H) 0.7 - 4.0 K/uL   Monocytes Relative 5 %   Monocytes Absolute 0.6 0.1 - 1.0 K/uL   Eosinophils Relative 1 %   Eosinophils Absolute 0.1 0.0 - 0.7 K/uL   Basophils Relative 0 %   Basophils Absolute 0.0 0.0 - 0.1 K/uL  Protime-INR  Result Value Ref Range   Prothrombin Time 12.1 11.4 - 15.2 seconds   INR 0.91   Urinalysis, Routine w reflex microscopic  Result Value Ref Range   Color, Urine STRAW (A) YELLOW   APPearance CLEAR CLEAR   Specific Gravity, Urine 1.011 1.005 - 1.030   pH 6.0 5.0 - 8.0   Glucose, UA NEGATIVE NEGATIVE mg/dL   Hgb urine dipstick SMALL (A) NEGATIVE   Bilirubin Urine NEGATIVE NEGATIVE   Ketones, ur NEGATIVE NEGATIVE mg/dL   Protein, ur NEGATIVE NEGATIVE mg/dL   Nitrite NEGATIVE NEGATIVE   Leukocytes, UA NEGATIVE NEGATIVE   RBC / HPF NONE SEEN 0 - 5 RBC/hpf   WBC, UA 0-5 0 - 5 WBC/hpf   Bacteria, UA NONE SEEN NONE SEEN   Squamous Epithelial / LPF 0-5 (A) NONE SEEN   Mucus PRESENT       Assessment & Plan:   Problem List Items Addressed This Visit    None    Visit Diagnoses    Arthralgia, unspecified joint    -  Primary   Of unclear etiology. Has been going on for a month. Will obtain x-ray of R hand and blood work. Tramadol for pain. No  NSAIDs as she's having surg tomorrow.   Relevant Orders   Comprehensive metabolic panel   TSH   RA Qn+CCP(IgG/A)+SjoSSA+SjoSSB   Antinuclear Antib (ANA)   Lyme Ab/Western Blot Reflex  Rocky mtn spotted fvr abs pnl(IgG+IgM)   Ehrlichia Antibody Panel   Babesia microti Antibody Panel   Sed Rate (ESR)   CK (Creatine Kinase)   DG Hand Complete Right   Uric acid   VITAMIN D 25 Hydroxy (Vit-D Deficiency, Fractures)       Follow up plan: Return Pending results.Marland Kitchen

## 2018-03-05 ENCOUNTER — Encounter (HOSPITAL_COMMUNITY)
Admission: RE | Disposition: A | Discharge status: Home / Self Care | Payer: Self-pay | Source: Ambulatory Visit | Attending: Neurosurgery

## 2018-03-05 ENCOUNTER — Inpatient Hospital Stay (HOSPITAL_COMMUNITY)
Admission: RE | Admit: 2018-03-05 | Discharge: 2018-03-07 | DRG: 026 | Disposition: A | Discharge status: Home / Self Care | Payer: No Typology Code available for payment source | Source: Ambulatory Visit | Attending: Neurosurgery | Admitting: Neurosurgery

## 2018-03-05 ENCOUNTER — Ambulatory Visit (HOSPITAL_COMMUNITY)
Admission: RE | Admit: 2018-03-05 | Discharge: 2018-03-05 | Disposition: A | Discharge status: Home / Self Care | Payer: No Typology Code available for payment source | Source: Ambulatory Visit | Attending: Neurosurgery | Admitting: Neurosurgery

## 2018-03-05 ENCOUNTER — Encounter (HOSPITAL_COMMUNITY): Payer: Self-pay | Admitting: Anesthesiology

## 2018-03-05 ENCOUNTER — Inpatient Hospital Stay (HOSPITAL_COMMUNITY): Payer: No Typology Code available for payment source | Admitting: Anesthesiology

## 2018-03-05 ENCOUNTER — Inpatient Hospital Stay (HOSPITAL_COMMUNITY): Payer: No Typology Code available for payment source

## 2018-03-05 DIAGNOSIS — I9789 Other postprocedural complications and disorders of the circulatory system, not elsewhere classified: Secondary | ICD-10-CM | POA: Diagnosis not present

## 2018-03-05 DIAGNOSIS — I671 Cerebral aneurysm, nonruptured: Principal | ICD-10-CM | POA: Diagnosis present

## 2018-03-05 DIAGNOSIS — Z888 Allergy status to other drugs, medicaments and biological substances status: Secondary | ICD-10-CM

## 2018-03-05 DIAGNOSIS — Z8639 Personal history of other endocrine, nutritional and metabolic disease: Secondary | ICD-10-CM | POA: Diagnosis not present

## 2018-03-05 DIAGNOSIS — E119 Type 2 diabetes mellitus without complications: Secondary | ICD-10-CM | POA: Diagnosis present

## 2018-03-05 DIAGNOSIS — R200 Anesthesia of skin: Secondary | ICD-10-CM | POA: Diagnosis present

## 2018-03-05 DIAGNOSIS — Z79899 Other long term (current) drug therapy: Secondary | ICD-10-CM | POA: Diagnosis not present

## 2018-03-05 DIAGNOSIS — Y92230 Patient room in hospital as the place of occurrence of the external cause: Secondary | ICD-10-CM | POA: Diagnosis not present

## 2018-03-05 DIAGNOSIS — Z7982 Long term (current) use of aspirin: Secondary | ICD-10-CM

## 2018-03-05 DIAGNOSIS — Z7902 Long term (current) use of antithrombotics/antiplatelets: Secondary | ICD-10-CM | POA: Diagnosis not present

## 2018-03-05 DIAGNOSIS — Y838 Other surgical procedures as the cause of abnormal reaction of the patient, or of later complication, without mention of misadventure at the time of the procedure: Secondary | ICD-10-CM | POA: Diagnosis not present

## 2018-03-05 DIAGNOSIS — Z7984 Long term (current) use of oral hypoglycemic drugs: Secondary | ICD-10-CM | POA: Diagnosis not present

## 2018-03-05 DIAGNOSIS — E559 Vitamin D deficiency, unspecified: Secondary | ICD-10-CM | POA: Diagnosis present

## 2018-03-05 HISTORY — PX: IR ANGIOGRAM FOLLOW UP STUDY: IMG697

## 2018-03-05 HISTORY — PX: IR TRANSCATH/EMBOLIZ: IMG695

## 2018-03-05 HISTORY — PX: IR NEURO EACH ADD'L AFTER BASIC UNI LEFT (MS): IMG5373

## 2018-03-05 HISTORY — PX: RADIOLOGY WITH ANESTHESIA: SHX6223

## 2018-03-05 HISTORY — PX: IR ANGIO INTRA EXTRACRAN SEL INTERNAL CAROTID UNI L MOD SED: IMG5361

## 2018-03-05 LAB — GLUCOSE, CAPILLARY
GLUCOSE-CAPILLARY: 105 mg/dL — AB (ref 65–99)
Glucose-Capillary: 253 mg/dL — ABNORMAL HIGH (ref 65–99)

## 2018-03-05 LAB — POCT PREGNANCY, URINE: Preg Test, Ur: NEGATIVE

## 2018-03-05 SURGERY — IR WITH ANESTHESIA
Anesthesia: General

## 2018-03-05 MED ORDER — VITAMIN B-12 1000 MCG PO TABS
1000.0000 ug | ORAL_TABLET | Freq: Every day | ORAL | Status: DC
  Administered 2018-03-05 – 2018-03-07 (×3): 1000 ug via ORAL
  Filled 2018-03-05 (×3): qty 1

## 2018-03-05 MED ORDER — FENTANYL CITRATE (PF) 100 MCG/2ML IJ SOLN
25.0000 ug | INTRAMUSCULAR | Status: DC | PRN
  Administered 2018-03-05 (×3): 50 ug via INTRAVENOUS

## 2018-03-05 MED ORDER — LIDOCAINE HCL (CARDIAC) 20 MG/ML IV SOLN
INTRAVENOUS | Status: DC | PRN
  Administered 2018-03-05: 100 mg via INTRAVENOUS

## 2018-03-05 MED ORDER — ALPRAZOLAM 0.25 MG PO TABS
0.5000 mg | ORAL_TABLET | Freq: Once | ORAL | Status: AC
  Administered 2018-03-05: 0.5 mg via ORAL

## 2018-03-05 MED ORDER — DEXAMETHASONE SODIUM PHOSPHATE 10 MG/ML IJ SOLN
10.0000 mg | Freq: Once | INTRAMUSCULAR | Status: AC
  Administered 2018-03-05: 10 mg via INTRAVENOUS

## 2018-03-05 MED ORDER — PHENYLEPHRINE HCL 10 MG/ML IJ SOLN
INTRAVENOUS | Status: DC | PRN
  Administered 2018-03-05: 25 ug/min via INTRAVENOUS

## 2018-03-05 MED ORDER — DOCUSATE SODIUM 100 MG PO CAPS
100.0000 mg | ORAL_CAPSULE | Freq: Two times a day (BID) | ORAL | Status: DC
  Administered 2018-03-05 – 2018-03-07 (×4): 100 mg via ORAL
  Filled 2018-03-05 (×4): qty 1

## 2018-03-05 MED ORDER — INSULIN ASPART 100 UNIT/ML ~~LOC~~ SOLN
0.0000 [IU] | Freq: Three times a day (TID) | SUBCUTANEOUS | Status: DC
  Administered 2018-03-05: 8 [IU] via SUBCUTANEOUS
  Administered 2018-03-06: 2 [IU] via SUBCUTANEOUS
  Administered 2018-03-06: 3 [IU] via SUBCUTANEOUS
  Administered 2018-03-06: 2 [IU] via SUBCUTANEOUS

## 2018-03-05 MED ORDER — SODIUM CHLORIDE 0.9 % IV SOLN
INTRAVENOUS | Status: DC
  Administered 2018-03-05 – 2018-03-06 (×2): via INTRAVENOUS

## 2018-03-05 MED ORDER — OXYCODONE HCL 5 MG PO TABS
ORAL_TABLET | ORAL | Status: AC
  Filled 2018-03-05: qty 2

## 2018-03-05 MED ORDER — VITAMIN D 1000 UNITS PO TABS
2000.0000 [IU] | ORAL_TABLET | Freq: Every day | ORAL | Status: DC
  Administered 2018-03-05 – 2018-03-07 (×3): 2000 [IU] via ORAL
  Filled 2018-03-05 (×3): qty 2

## 2018-03-05 MED ORDER — LORATADINE 10 MG PO TABS
10.0000 mg | ORAL_TABLET | Freq: Every day | ORAL | Status: DC
  Administered 2018-03-06 – 2018-03-07 (×2): 10 mg via ORAL
  Filled 2018-03-05 (×2): qty 1

## 2018-03-05 MED ORDER — FENTANYL CITRATE (PF) 100 MCG/2ML IJ SOLN
INTRAMUSCULAR | Status: DC | PRN
  Administered 2018-03-05 (×4): 50 ug via INTRAVENOUS

## 2018-03-05 MED ORDER — OXYCODONE HCL 5 MG PO TABS
10.0000 mg | ORAL_TABLET | Freq: Four times a day (QID) | ORAL | Status: DC | PRN
  Administered 2018-03-05 – 2018-03-06 (×2): 10 mg via ORAL
  Filled 2018-03-05 (×2): qty 2

## 2018-03-05 MED ORDER — ONDANSETRON HCL 4 MG/2ML IJ SOLN
INTRAMUSCULAR | Status: DC | PRN
  Administered 2018-03-05: 4 mg via INTRAVENOUS

## 2018-03-05 MED ORDER — METFORMIN HCL ER 500 MG PO TB24
1000.0000 mg | ORAL_TABLET | Freq: Every day | ORAL | Status: DC
  Administered 2018-03-06: 1000 mg via ORAL
  Filled 2018-03-05: qty 2

## 2018-03-05 MED ORDER — DEXAMETHASONE SODIUM PHOSPHATE 10 MG/ML IJ SOLN
INTRAMUSCULAR | Status: AC
  Filled 2018-03-05: qty 1

## 2018-03-05 MED ORDER — FENTANYL CITRATE (PF) 100 MCG/2ML IJ SOLN
INTRAMUSCULAR | Status: AC
  Administered 2018-03-05: 50 ug via INTRAVENOUS
  Filled 2018-03-05: qty 2

## 2018-03-05 MED ORDER — PROPOFOL 10 MG/ML IV BOLUS
INTRAVENOUS | Status: DC | PRN
  Administered 2018-03-05: 20 mg via INTRAVENOUS
  Administered 2018-03-05: 160 mg via INTRAVENOUS

## 2018-03-05 MED ORDER — SODIUM CHLORIDE 0.9 % IV SOLN
INTRAVENOUS | Status: DC | PRN
  Administered 2018-03-05: 09:00:00 via INTRAVENOUS

## 2018-03-05 MED ORDER — LABETALOL HCL 5 MG/ML IV SOLN
INTRAVENOUS | Status: AC
  Filled 2018-03-05: qty 4

## 2018-03-05 MED ORDER — ROCURONIUM BROMIDE 100 MG/10ML IV SOLN
INTRAVENOUS | Status: DC | PRN
  Administered 2018-03-05: 20 mg via INTRAVENOUS
  Administered 2018-03-05: 50 mg via INTRAVENOUS
  Administered 2018-03-05: 15 mg via INTRAVENOUS

## 2018-03-05 MED ORDER — FENTANYL CITRATE (PF) 100 MCG/2ML IJ SOLN
INTRAMUSCULAR | Status: AC
  Filled 2018-03-05: qty 2

## 2018-03-05 MED ORDER — HYDROCODONE-ACETAMINOPHEN 5-325 MG PO TABS
ORAL_TABLET | ORAL | Status: AC
  Administered 2018-03-06: 1 via ORAL
  Filled 2018-03-05: qty 1

## 2018-03-05 MED ORDER — SENNA 8.6 MG PO TABS
1.0000 | ORAL_TABLET | Freq: Two times a day (BID) | ORAL | Status: DC
  Administered 2018-03-05 – 2018-03-07 (×4): 8.6 mg via ORAL
  Filled 2018-03-05 (×4): qty 1

## 2018-03-05 MED ORDER — ONDANSETRON HCL 4 MG/2ML IJ SOLN: 4.0000 mg | INTRAMUSCULAR | Status: DC | PRN

## 2018-03-05 MED ORDER — ALPRAZOLAM 0.25 MG PO TABS
ORAL_TABLET | ORAL | Status: AC
  Filled 2018-03-05: qty 2

## 2018-03-05 MED ORDER — HEPARIN SODIUM (PORCINE) 1000 UNIT/ML IJ SOLN
INTRAMUSCULAR | Status: DC | PRN
  Administered 2018-03-05: 5000 [IU] via INTRAVENOUS

## 2018-03-05 MED ORDER — CEFAZOLIN SODIUM-DEXTROSE 2-4 GM/100ML-% IV SOLN
2.0000 g | INTRAVENOUS | Status: AC
  Administered 2018-03-05: 2 g via INTRAVENOUS
  Filled 2018-03-05: qty 100

## 2018-03-05 MED ORDER — IOPAMIDOL (ISOVUE-300) INJECTION 61%
INTRAVENOUS | Status: AC
  Administered 2018-03-05: 45 mL
  Filled 2018-03-05: qty 150

## 2018-03-05 MED ORDER — SUGAMMADEX SODIUM 500 MG/5ML IV SOLN
INTRAVENOUS | Status: DC | PRN
  Administered 2018-03-05: 400 mg via INTRAVENOUS

## 2018-03-05 MED ORDER — HYDROCODONE-ACETAMINOPHEN 5-325 MG PO TABS
1.0000 | ORAL_TABLET | ORAL | Status: DC | PRN
  Administered 2018-03-05 – 2018-03-07 (×5): 1 via ORAL
  Filled 2018-03-05 (×3): qty 1

## 2018-03-05 MED ORDER — CHLORHEXIDINE GLUCONATE CLOTH 2 % EX PADS: 6.0000 | MEDICATED_PAD | Freq: Once | CUTANEOUS | Status: DC

## 2018-03-05 MED ORDER — LACTATED RINGERS IV SOLN
INTRAVENOUS | Status: DC | PRN
  Administered 2018-03-05: 09:00:00 via INTRAVENOUS

## 2018-03-05 MED ORDER — HYDROCODONE-ACETAMINOPHEN 5-325 MG PO TABS
ORAL_TABLET | ORAL | Status: AC
  Administered 2018-03-05: 1 via ORAL
  Filled 2018-03-05: qty 1

## 2018-03-05 MED ORDER — FLUTICASONE PROPIONATE 50 MCG/ACT NA SUSP
2.0000 | Freq: Every day | NASAL | Status: DC
  Filled 2018-03-05: qty 16

## 2018-03-05 MED ORDER — ONDANSETRON HCL 4 MG PO TABS: 4.0000 mg | ORAL_TABLET | ORAL | Status: DC | PRN

## 2018-03-05 MED ORDER — INSULIN ASPART 100 UNIT/ML ~~LOC~~ SOLN: 0.0000 [IU] | Freq: Three times a day (TID) | SUBCUTANEOUS | Status: DC

## 2018-03-05 MED ORDER — LABETALOL HCL 5 MG/ML IV SOLN: 10.0000 mg | INTRAVENOUS | Status: DC | PRN

## 2018-03-05 NOTE — Transfer of Care (Signed)
Immediate Anesthesia Transfer of Care Note  Patient: Ashley Wiley  Procedure(s) Performed: Arteriogram, coil embolization, possible stenting (N/A )  Patient Location: PACU  Anesthesia Type:General  Level of Consciousness: awake, alert  and oriented  Airway & Oxygen Therapy: Patient Spontanous Breathing and Patient connected to nasal cannula oxygen  Post-op Assessment: Report given to RN, Post -op Vital signs reviewed and stable and Patient moving all extremities  Post vital signs: Reviewed and stable  Last Vitals:  Vitals Value Taken Time  BP 129/85 03/05/2018 11:11 AM  Temp    Pulse 92 03/05/2018 11:15 AM  Resp 16 03/05/2018 11:15 AM  SpO2 99 % 03/05/2018 11:15 AM  Vitals shown include unvalidated device data.  Last Pain:  Vitals:   03/05/18 1105  TempSrc:   PainSc: (P) 0-No pain         Complications: No apparent anesthesia complications

## 2018-03-05 NOTE — H&P (Signed)
Chief Complaint   No chief complaint on file.   HPI   HPI: Ashley Wiley is a 42 y.o. female with incidental diagnosis of an anterior communicating artery aneurysm.  She recently underwent further workup with a diagnostic cerebral angiogram which demonstrates an approximately 5 x 3 millimeter aneurysm at the anterior communicating artery in the setting of a dominant left A1. She comes in today for coiling. She feels well and is without any concerns.  Patient Active Problem List   Diagnosis Date Noted  . Fibroid 02/08/2018  . Menstrual migraine without status migrainosus, not intractable 02/08/2018  . Menorrhagia with regular cycle 02/08/2018  . Acute anxiety 01/13/2016  . History of HPV infection 01/13/2016  . Elevated ALT measurement 10/05/2015  . Allergic rhinitis 07/13/2015  . Thyroid nodule 07/13/2015  . Obesity 05/14/2015  . IFG (impaired fasting glucose)   . Hyperlipidemia   . Patella-femoral syndrome   . Vitamin D deficiency disease   . Dermatofibroma   . Depression   . Pinguecula     PMH: Past Medical History:  Diagnosis Date  . Allergy   . Aneurysm ([place])   . Anxiety   . Depression   . Dermatofibroma   . Diabetes mellitus without complication ([place])    Takes Metformin  dx 2016  . Facial pain   . GERD (gastroesophageal reflux disease)   . Headache   . Hyperlipidemia   . IFG (impaired fasting glucose)   . Low HDL (under 40)   . Overweight   . Patella-femoral syndrome   . Pinguecula   . Thyroid nodule april 2016  . Vitamin D deficiency disease     PSH: Past Surgical History:  Procedure Laterality Date  . CARPAL TUNNEL RELEASE    . GANGLION CYST EXCISION Left   . IR 3D INDEPENDENT WKST  12/04/2017  . IR ANGIO INTRA EXTRACRAN SEL INTERNAL CAROTID BILAT MOD SED  12/04/2017  . IR ANGIO VERTEBRAL SEL VERTEBRAL BILAT MOD SED  12/04/2017  . NASAL SINUS SURGERY  Nov 2015  . TUBAL LIGATION      Medications Prior to Admission  Medication Sig Dispense Refill  Last Dose  . acetaminophen (TYLENOL) 500 MG tablet Take 1,000 mg by mouth daily as needed for moderate pain or headache.   Taking  . aspirin EC 325 MG tablet Take 325 mg by mouth daily. Start 7 days prior to procedure   Taking  . Cholecalciferol (VITAMIN D) 2000 UNITS CAPS Take 2,000 Units by mouth daily.    Taking  . clopidogrel (PLAVIX) 75 MG tablet Take 75 mg by mouth daily. Start 7 days prior to procedure  2 Taking  . cyanocobalamin 1000 MCG tablet Take 1,000 mcg by mouth daily.    Taking  . fluticasone (FLONASE) 50 MCG/ACT nasal spray Place 2 sprays into both nostrils daily. 16 g 12 Taking  . glucose blood (BAYER CONTOUR TEST) test strip 1 each by Other route as needed for other. Use as instructed   Taking  . ibuprofen (ADVIL,MOTRIN) 200 MG tablet Take 800 mg by mouth daily as needed for headache or moderate pain.   Taking  . loratadine (CLARITIN) 10 MG tablet Take 10 mg by mouth daily.    Taking  . metFORMIN (GLUCOPHAGE-XR) 500 MG 24 hr tablet TAKE 2 TABLETS (1,000 MG TOTAL) BY MOUTH EVERY EVENING. 180 tablet 1 Taking  . predniSONE (DELTASONE) 10 MG tablet Take 6 tabs day one, 5 tabs day two, 4 tabs day three, etc (Patient  not taking: Reported on 03/04/2018) 21 tablet 0 Not Taking  . traMADol (ULTRAM) 50 MG tablet Take 1 tablet (50 mg total) by mouth every 8 (eight) hours as needed. 30 tablet 0     SH: Social History   Tobacco Use  . Smoking status: Never Smoker  . Smokeless tobacco: Never Used  Substance Use Topics  . Alcohol use: Not Currently    Alcohol/week: 0.0 - 0.6 oz    Comment: on occasion  . Drug use: No    MEDS: Prior to Admission medications   Medication Sig Start Date End Date Taking? Authorizing Provider  acetaminophen (TYLENOL) 500 MG tablet Take 1,000 mg by mouth daily as needed for moderate pain or headache.    [provider]  aspirin EC 325 MG tablet Take 325 mg by mouth daily. Start 7 days prior to procedure    [provider]   Cholecalciferol (VITAMIN D) 2000 UNITS CAPS Take 2,000 Units by mouth daily.     [provider]  clopidogrel (PLAVIX) 75 MG tablet Take 75 mg by mouth daily. Start 7 days prior to procedure 12/13/17   [provider]  cyanocobalamin 1000 MCG tablet Take 1,000 mcg by mouth daily.     [provider]  fluticasone (FLONASE) 50 MCG/ACT nasal spray Place 2 sprays into both nostrils daily. 10/23/17   Johnson, Megan P, DO  glucose blood (BAYER CONTOUR TEST) test strip 1 each by Other route as needed for other. Use as instructed    [provider]  ibuprofen (ADVIL,MOTRIN) 200 MG tablet Take 800 mg by mouth daily as needed for headache or moderate pain.    [provider]  loratadine (CLARITIN) 10 MG tablet Take 10 mg by mouth daily.     [provider]  metFORMIN (GLUCOPHAGE-XR) 500 MG 24 hr tablet TAKE 2 TABLETS (1,000 MG TOTAL) BY MOUTH EVERY EVENING. 02/20/18   Johnson, Megan P, DO  predniSONE (DELTASONE) 10 MG tablet Take 6 tabs day one, 5 tabs day two, 4 tabs day three, etc Patient not taking: Reported on 03/04/2018 02/22/18   Volney American, PA-C  traMADol (ULTRAM) 50 MG tablet Take 1 tablet (50 mg total) by mouth every 8 (eight) hours as needed. 03/04/18   Park Liter P, DO    ALLERGY: Allergies  Allergen Reactions  . Wellbutrin [Bupropion] Other (See Comments)    Increased anxiety and tremors    Social History   Tobacco Use  . Smoking status: Never Smoker  . Smokeless tobacco: Never Used  Substance Use Topics  . Alcohol use: Not Currently    Alcohol/week: 0.0 - 0.6 oz    Comment: on occasion     Family History  Problem Relation Age of Onset  . Diabetes Father   . Hypertension Father   . Heart disease Father   . Kidney disease Father   . Stroke Father   . Aneurysm Father   . Diabetes Paternal Grandfather      ROS   Review of Systems  Constitutional: Negative.   HENT: Negative.   Eyes: Negative.   Respiratory:  Negative.   Cardiovascular: Negative.   Gastrointestinal: Negative.   Musculoskeletal: Negative.   Skin: Negative.   Neurological: Negative.   Endo/Heme/Allergies: Negative.     Exam   There were no vitals filed for this visit. General appearance: WDWN, NAD Eyes: PERRL, Fundoscopic: normal Cardiovascular: Regular rate and rhythm without murmurs, rubs, gallops. No edema or variciosities. Distal pulses normal. Pulmonary:  Clear to auscultation Musculoskeletal:     Muscle tone upper extremities: Normal    Muscle tone lower extremities: Normal    Motor exam: Upper Extremities Deltoid Bicep Tricep Grip  Right 5/5 5/5 5/5 5/5  Left 5/5 5/5 5/5 5/5   Lower Extremity IP Quad PF DF EHL  Right 5/5 5/5 5/5 5/5 5/5  Left 5/5 5/5 5/5 5/5 5/5   Neurological Awake, alert, oriented Memory and concentration grossly intact Speech fluent, appropriate CNII: Visual fields normal CNIII/IV/VI: EOMI CNV: Facial sensation normal CNVII: Symmetric, normal strength CNVIII: Grossly normal CNIX: Normal palate movement CNXI: Trap and SCM strength normal CN XII: Tongue protrusion normal Sensation grossly intact to LT DTR: Normal Coordination (finger/nose & heel/shin): Normal  Results - Imaging/Labs   No results found for this or any previous visit (from the past 48 hour(s)).  Dg Hand Complete Right  Result Date: 03/04/2018 CLINICAL DATA:  Arthralgia.  Pain and swelling. EXAM: RIGHT HAND - COMPLETE 3+ VIEW COMPARISON:  None. FINDINGS: There is no evidence of fracture or dislocation. There is no evidence of arthropathy or other focal bone abnormality. Soft tissue swelling is noted at the base of the phalanges. IMPRESSION: Soft tissue swelling.  No acute or focal osseous abnormality. Electronically Signed   By: Staci Righter M.D.   On: 03/04/2018 14:03    Impression/Plan   - 42 y.o. female with incidentally discovered unruptured 5 millimeter anterior communicating artery aneurysm.  It was  recommended that she undergo endovascular coil embolization of the ACA aneurysm with possible stent support. While in the office, Dr Kathyrn Sheriff discussed  in detail the treatment options for the aneurysm, as well as the rationale for treating the aneurysm at all.  I talked to her about surgical clipping, as well as endovascular coiling. Also spoke about the need for aspirin and Plavix before the procedure and possibly to continue for at least 3 months after the procedure if we do place a stent.  The risks of the procedure were explained in detail including risk of stroke or hemorrhage during or immediately after the procedure.  He also reviewed other risks of procedure including contrast reaction, nephropathy, or groin hematoma.   In addition, he talked to her about the general risks of anesthesia including heart attack, stroke, or DVT/PE.  The patient understood our discussion and is willing to proceed. All the patient's questions were answered. She wished to proceed. She has been on ASA and Plavix for at least 7 prior to procedure.

## 2018-03-05 NOTE — Anesthesia Procedure Notes (Signed)
Arterial Line Insertion Start/End4/16/2019 7:45 AM, 03/05/2018 8:25 AM Performed by: Josephine Igo, CRNA, CRNA  Patient location: Pre-op. Preanesthetic checklist: patient identified, IV checked, site marked, risks and benefits discussed, surgical consent, monitors and equipment checked, pre-op evaluation and timeout performed Lidocaine 1% used for infiltration and patient sedated Right, radial was placed Catheter size: 20 G Hand hygiene performed , maximum sterile barriers used  and Seldinger technique used Allen's test indicative of satisfactory collateral circulation Attempts: 5 or more Procedure performed using ultrasound guided (Ultrasound guidance per Dr. Glennon Mac.) technique. Following insertion, Biopatch and dressing applied. Post procedure assessment: normal  Patient tolerated the procedure well with no immediate complications.

## 2018-03-05 NOTE — Progress Notes (Signed)
Called to PACU. Patient is crying due to headache. Has been given decadgon 10mg  with minimal to no relief. Also complains of right sided numbness and weakness. On exam, does have some mild right sided weakness. Order for Xanax given verbally by Dr Kathyrn Sheriff. Stat head CT ordered.

## 2018-03-05 NOTE — Progress Notes (Signed)
POST-OPERATIVE NOTE   Pt seen in PACU.  Awake, alert Speech fluent, appropriate CN grossly intact MAE well  IMPRESSION: 42 y.o. woman s/p coil embolization Acom aneurysm, appears to be neurologically at baseline  PLAN: - Admit to neuro ICU  - SBP < 152mmHg - No need for ASA/Plavix as no stent was needed.

## 2018-03-05 NOTE — Anesthesia Postprocedure Evaluation (Signed)
Anesthesia Post Note  Patient: Ashley Wiley  Procedure(s) Performed: Arteriogram, coil embolization, possible stenting (N/A )     Patient location during evaluation: PACU Anesthesia Type: General Level of consciousness: awake and alert Pain management: pain level controlled Vital Signs Assessment: post-procedure vital signs reviewed and stable Respiratory status: spontaneous breathing, nonlabored ventilation, respiratory function stable and patient connected to nasal cannula oxygen Cardiovascular status: blood pressure returned to baseline and stable Postop Assessment: no apparent nausea or vomiting Anesthetic complications: no    Last Vitals:  Vitals:   03/05/18 1415 03/05/18 1430  BP: 135/88 (!) 148/101  Pulse: 93 95  Resp: 13 (!) 7  Temp:    SpO2: 100% 99%    Last Pain:  Vitals:   03/05/18 1345  TempSrc:   PainSc: 0-No pain                 Kyriaki Moder EDWARD

## 2018-03-05 NOTE — Anesthesia Procedure Notes (Signed)
Procedure Name: Intubation Date/Time: 03/05/2018 9:02 AM Performed by: Kyung Rudd, CRNA Pre-anesthesia Checklist: Patient identified, Emergency Drugs available, Suction available and Patient being monitored Patient Re-evaluated:Patient Re-evaluated prior to induction Oxygen Delivery Method: Circle system utilized Preoxygenation: Pre-oxygenation with 100% oxygen Induction Type: IV induction Ventilation: Mask ventilation without difficulty Laryngoscope Size: Mac and 3 Grade View: Grade I Tube type: Oral Tube size: 7.0 mm Number of attempts: 1 Airway Equipment and Method: Stylet Placement Confirmation: ETT inserted through vocal cords under direct vision,  positive ETCO2 and breath sounds checked- equal and bilateral Secured at: 20 cm Tube secured with: Tape Dental Injury: Teeth and Oropharynx as per pre-operative assessment  Comments: AOI per Dr. Seward Speck.

## 2018-03-05 NOTE — Progress Notes (Signed)
Care of pt assumed by MA Taci Sterling RN 

## 2018-03-06 ENCOUNTER — Other Ambulatory Visit: Payer: Self-pay

## 2018-03-06 ENCOUNTER — Encounter (HOSPITAL_COMMUNITY): Payer: Self-pay | Admitting: Neurosurgery

## 2018-03-06 ENCOUNTER — Encounter: Payer: Self-pay | Admitting: Family Medicine

## 2018-03-06 LAB — GLUCOSE, CAPILLARY
GLUCOSE-CAPILLARY: 136 mg/dL — AB (ref 65–99)
GLUCOSE-CAPILLARY: 135 mg/dL — AB (ref 65–99)
GLUCOSE-CAPILLARY: 162 mg/dL — AB (ref 65–99)
Glucose-Capillary: 100 mg/dL — ABNORMAL HIGH (ref 65–99)
Glucose-Capillary: 141 mg/dL — ABNORMAL HIGH (ref 65–99)
Glucose-Capillary: 162 mg/dL — ABNORMAL HIGH (ref 65–99)

## 2018-03-06 LAB — MRSA PCR SCREENING: MRSA by PCR: NEGATIVE

## 2018-03-06 MED ORDER — ASPIRIN 325 MG PO TABS
325.0000 mg | ORAL_TABLET | Freq: Every day | ORAL | Status: DC
  Administered 2018-03-06 – 2018-03-07 (×2): 325 mg via ORAL
  Filled 2018-03-06 (×2): qty 1

## 2018-03-06 NOTE — Plan of Care (Signed)
No acute changes. Patient doing very well. Will continue to monitor. Care plan updated.

## 2018-03-06 NOTE — Progress Notes (Signed)
No issues overnight. Pt does cont to c/o numbness of right face/arm/leg. Says it is improved from yesterday.  EXAM:  BP 123/67   Pulse 77   Temp 97.8 F (36.6 C) (Oral)   Resp 14   Ht 5\' 2"  (1.575 m)   Wt 73.5 kg (162 lb 0.6 oz)   LMP 02/22/2018   SpO2 98%   BMI 29.64 kg/m   Awake, alert, oriented  Speech fluent, appropriate  CN grossly intact  5/5 BUE/BLE   IMAGING: CTH yesterday reviewed, unremarkable  IMPRESSION:  42 y.o. female s/p Acom aneurysm coiling. Right hemi-body numbness of unclear etiology but appears to be improving.  PLAN: - d/c a-line - OOB today - Will add daily ASA - Likely d/c home tomorrow

## 2018-03-07 ENCOUNTER — Other Ambulatory Visit: Payer: Self-pay | Admitting: Family Medicine

## 2018-03-07 ENCOUNTER — Ambulatory Visit: Payer: Self-pay | Admitting: *Deleted

## 2018-03-07 ENCOUNTER — Encounter: Payer: Self-pay | Admitting: Family Medicine

## 2018-03-07 DIAGNOSIS — M79643 Pain in unspecified hand: Secondary | ICD-10-CM

## 2018-03-07 LAB — ANA: Anti Nuclear Antibody(ANA): NEGATIVE

## 2018-03-07 LAB — COMPREHENSIVE METABOLIC PANEL
ALBUMIN: 4.1 g/dL (ref 3.5–5.5)
ALK PHOS: 68 IU/L (ref 39–117)
ALT: 23 IU/L (ref 0–32)
AST: 17 IU/L (ref 0–40)
Albumin/Globulin Ratio: 1.9 (ref 1.2–2.2)
BILIRUBIN TOTAL: 0.4 mg/dL (ref 0.0–1.2)
BUN / CREAT RATIO: 22 (ref 9–23)
BUN: 11 mg/dL (ref 6–24)
CHLORIDE: 99 mmol/L (ref 96–106)
CO2: 23 mmol/L (ref 20–29)
CREATININE: 0.51 mg/dL — AB (ref 0.57–1.00)
Calcium: 9 mg/dL (ref 8.7–10.2)
GFR calc Af Amer: 139 mL/min/{1.73_m2} (ref >59)
GFR calc non Af Amer: 121 mL/min/{1.73_m2} (ref >59)
GLUCOSE: 104 mg/dL — AB (ref 65–99)
Globulin, Total: 2.2 g/dL (ref 1.5–4.5)
Potassium: 4.3 mmol/L (ref 3.5–5.2)
SODIUM: 139 mmol/L (ref 134–144)
Total Protein: 6.3 g/dL (ref 6.0–8.5)

## 2018-03-07 LAB — RA QN+CCP(IGG/A)+SJOSSA+SJOSSB
Cyclic Citrullin Peptide Ab: 6 units (ref 0–19)
ENA SSA (RO) Ab: <0.2 AI (ref 0.0–0.9)
RHEUMATOID FACTOR: 10.8 [IU]/mL (ref 0.0–13.9)

## 2018-03-07 LAB — EHRLICHIA ANTIBODY PANEL
E. CHAFFEENSIS (HME) IGM TITER: NEGATIVE
E. CHAFFEENSIS IGG AB: NEGATIVE
HGE IGG TITER: NEGATIVE
HGE IgM Titer: NEGATIVE

## 2018-03-07 LAB — ROCKY MTN SPOTTED FVR ABS PNL(IGG+IGM)
RMSF IGG: NEGATIVE
RMSF IgM: 0.82 index (ref 0.00–0.89)

## 2018-03-07 LAB — CK: Total CK: 60 U/L (ref 24–173)

## 2018-03-07 LAB — SEDIMENTATION RATE: Sed Rate: 9 mm/hr (ref 0–32)

## 2018-03-07 LAB — LYME AB/WESTERN BLOT REFLEX: Lyme IgG/IgM Ab: <0.91 {ISR} (ref 0.00–0.90)

## 2018-03-07 LAB — BABESIA MICROTI ANTIBODY PANEL
Babesia microti IgG: <1:10 {titer}
Babesia microti IgM: <1:10 {titer}

## 2018-03-07 LAB — GLUCOSE, CAPILLARY: GLUCOSE-CAPILLARY: 93 mg/dL (ref 65–99)

## 2018-03-07 LAB — URIC ACID: URIC ACID: 5.4 mg/dL (ref 2.5–7.1)

## 2018-03-07 LAB — VITAMIN D 25 HYDROXY (VIT D DEFICIENCY, FRACTURES): VIT D 25 HYDROXY: 17.2 ng/mL — AB (ref 30.0–100.0)

## 2018-03-07 LAB — TSH: TSH: 1.46 u[IU]/mL (ref 0.450–4.500)

## 2018-03-07 MED ORDER — HYDROCODONE-ACETAMINOPHEN 5-325 MG PO TABS: 1.0000 | ORAL_TABLET | ORAL | 0 refills | Status: DC | PRN

## 2018-03-07 NOTE — Telephone Encounter (Signed)
Please call patient to find out what's going on. She had surgery done and became weak on the L side of her body, CT normal. Was in the ICU, discharged today, but she said that she was in a lot of pain- is her neurosurgeon evaluating this? Did he give her anything? She asked for xanax- does he think that the pain is due to anxiety? I'd likely need to see her to discuss xanax and if she is having that much pain, I will need to see her or she will need to follow up with her neurosurgeon. Please also let her know that the rest of her labs came back normal, no sign of any tick diseases, funny arthritises or anything else to worry about. Thanks!

## 2018-03-07 NOTE — Telephone Encounter (Signed)
Patient would like to know what she can take for the pain that she is having in her hand due to the swelling ( can she take ibuprofen with the pain medication)   She would like to know what the nortriptyline is used for.

## 2018-03-07 NOTE — Telephone Encounter (Signed)
Should not be taking tramadol and hydrocodone at the same time. Can restart her nortriptyline to see if it helps

## 2018-03-07 NOTE — Telephone Encounter (Signed)
Patient states that the pain is not worse, that it is the same as it was and that it has been going on for over a week. She said that the surgeon does not think that it is related to the aneurysm.   Patient instructed to go to the ER if her pain gets worse or anything concerning happens.   Appointment scheduled for Monday 03/11/18.   Patient would like to know if she should be taking the Tramadol and the hydrocodone together?

## 2018-03-07 NOTE — Discharge Summary (Signed)
Physician Discharge Summary  Patient ID: Ashley Wiley MRN: [id number] DOB/AGE: 41-Feb-1977 41 y.o.  Admit date: 03/05/2018 Discharge date: 03/07/2018  Admission Diagnoses:  Nonruptured cerebral aneurysm   Discharge Diagnoses:  Same Active Problems:   Cerebral aneurysm, nonruptured   Discharged Condition: Stable  Hospital Course:  Ashley Wiley is a 42 y.o. female who was admitted for the below procedure. Post procedure complicated by transient right sided numbness. Head CT normal. Symptoms unclear etiology. Gradually improved to complete resolution at time of discharge.. At time of discharge, pain was well controlled, ambulating with Pt/OT, tolerating po, voiding normal. Ready for discharge.  Treatments: Surgery - IR coiling of aneurysm   Discharge Exam: Blood pressure 117/76, pulse 75, temperature 98 F (36.7 C), temperature source Oral, resp. rate 17, height 5\' 2"  (1.575 m), weight 73.5 kg (162 lb 0.6 oz), last menstrual period 02/22/2018, SpO2 100 %. Awake, alert, oriented Speech fluent, appropriate CN grossly intact 5/5 BUE/BLE Wound c/d/i  Disposition: Discharge disposition: 01-Home or Self Care       Discharge Instructions    Call MD for:  difficulty breathing, headache or visual disturbances   Complete by:  As directed    Call MD for:  persistant dizziness or light-headedness   Complete by:  As directed    Call MD for:  redness, tenderness, or signs of infection (pain, swelling, redness, odor or green/yellow discharge around incision site)   Complete by:  As directed    Call MD for:  severe uncontrolled pain   Complete by:  As directed    Call MD for:  temperature >100.4   Complete by:  As directed    Diet general   Complete by:  As directed    Driving Restrictions   Complete by:  As directed    Do not drive until given clearance.   Increase activity slowly   Complete by:  As directed    Lifting restrictions   Complete by:  As directed    Do  not lift anything >10lbs. Avoid bending and twisting in awkward positions. Avoid bending at the back.   May shower / Bathe   Complete by:  As directed    In 24 hours. Okay to wash wound with warm soapy water. Avoid scrubbing the wound. Pat dry.   Remove dressing in 24 hours   Complete by:  As directed      Allergies as of 03/07/2018      Reactions   Wellbutrin [bupropion] Other (See Comments)   Increased anxiety and tremors      Medication List    STOP taking these medications   traMADol 50 MG tablet Commonly known as:  ULTRAM     TAKE these medications   acetaminophen 500 MG tablet Commonly known as:  TYLENOL Take 1,000 mg by mouth daily as needed for moderate pain or headache.   aspirin EC 325 MG tablet Take 325 mg by mouth daily. Start 7 days prior to procedure   BAYER CONTOUR TEST test strip Generic drug:  glucose blood 1 each by Other route as needed for other. Use as instructed   clopidogrel 75 MG tablet Commonly known as:  PLAVIX Take 75 mg by mouth daily. Start 7 days prior to procedure   cyanocobalamin 1000 MCG tablet Take 1,000 mcg by mouth daily.   fluticasone 50 MCG/ACT nasal spray Commonly known as:  FLONASE Place 2 sprays into both nostrils daily.   HYDROcodone-acetaminophen 5-325 MG tablet Commonly known as:  NORCO/VICODIN Take 1 tablet by mouth every 4 (four) hours as needed for moderate pain.   ibuprofen 200 MG tablet Commonly known as:  ADVIL,MOTRIN Take 800 mg by mouth daily as needed for headache or moderate pain.   loratadine 10 MG tablet Commonly known as:  CLARITIN Take 10 mg by mouth daily.   metFORMIN 500 MG 24 hr tablet Commonly known as:  GLUCOPHAGE-XR TAKE 2 TABLETS (1,000 MG TOTAL) BY MOUTH EVERY EVENING.   Vitamin D 2000 units Caps Take 2,000 Units by mouth daily.      Follow-up Information    Lisbeth Renshaw, MD Follow up.   Specialty:  Neurosurgery Contact information: [address] [place]  [place] [address] [phone]           Signed: Alyson Ingles 03/07/2018, 8:16 AM

## 2018-03-07 NOTE — Progress Notes (Signed)
Neurosurgery Progress Note  No issues overnight.  Right sided numbness has resolved. No concerns this am. Eager to go home.  EXAM:  BP 117/76 (BP Location: Right Arm)   Pulse 75   Temp 98 F (36.7 C) (Oral)   Resp 17   Ht 5\' 2"  (1.575 m)   Wt 73.5 kg (162 lb 0.6 oz)   LMP 02/22/2018   SpO2 100%   BMI 29.64 kg/m   Awake, alert, oriented  Speech fluent, appropriate  CN grossly intact  5/5 BUE/BLE   PLAN Looks good this am. Numbness has resolved.  Remains neuro intact Cleared for d/c home

## 2018-03-07 NOTE — Telephone Encounter (Signed)
Pt information in Triage Queue; called to triage, pt states she just spoke with Tiffany CMA at office. Appt made for Monday 03/11/18 by CMA who also directed her to ED if symptoms worsen.

## 2018-03-08 NOTE — Telephone Encounter (Signed)
She can take ibuprofen with her pain medicine. The nortripytline will help if it's nerve pain. I know she has an appointment with ortho. Let me know if she needs anything else.

## 2018-03-11 ENCOUNTER — Ambulatory Visit: Payer: Self-pay | Admitting: Family Medicine

## 2018-03-11 ENCOUNTER — Telehealth: Payer: No Typology Code available for payment source | Admitting: Family

## 2018-03-11 ENCOUNTER — Encounter: Payer: Self-pay | Admitting: Family Medicine

## 2018-03-11 ENCOUNTER — Ambulatory Visit (INDEPENDENT_AMBULATORY_CARE_PROVIDER_SITE_OTHER): Payer: No Typology Code available for payment source | Admitting: Physician Assistant

## 2018-03-11 DIAGNOSIS — K219 Gastro-esophageal reflux disease without esophagitis: Secondary | ICD-10-CM

## 2018-03-11 DIAGNOSIS — R7301 Impaired fasting glucose: Secondary | ICD-10-CM

## 2018-03-11 DIAGNOSIS — E041 Nontoxic single thyroid nodule: Secondary | ICD-10-CM

## 2018-03-11 MED ORDER — FAMOTIDINE 20 MG PO TABS: 20.0000 mg | ORAL_TABLET | Freq: Two times a day (BID) | ORAL | 0 refills | Status: DC

## 2018-03-11 NOTE — Telephone Encounter (Signed)
Please send a new referral to [facility].

## 2018-03-11 NOTE — Progress Notes (Signed)
Thank you for the details you included in the comment boxes. Those details are very helpful in determining the best course of treatment for you and help [place] to provide the best care. I sent a prescription for Pepcid yet you can also get this over the counter. Whichever way is cheaper for you. I was going to prescribe Prilosec, yet this is not safe to take with your Plavix. The Pepcid should also help if you do it twice daily. It would be a good idea to followup with your primary care if this does not improve over the next 3-4 weeks.   We are sorry that you are not feeling well.  Here is how we plan to help!  Based on what you shared with me it looks like you most likely have Gastroesophageal Reflux Disease (GERD)  Gastroesophageal reflux disease (GERD) happens when acid from your stomach flows up into the esophagus.  When acid comes in contact with the esophagus, the acid causes sorenss (inflammation) in the esophagus.  Over time, GERD may create small holes (ulcers) in the lining of the esophagus.  I recommend using over the counter Pepcid 20mg  twice a day for two weeks.  Your symptoms should improve in the next day or two.  You can use antacids as needed until symptoms resolve.  Call us if your heartburn worsens, you have trouble swallowing, weight loss, spitting up blood or recurrent vomiting.  Home Care:  May include lifestyle changes such as weight loss, quitting smoking and alcohol consumption  Avoid foods and drinks that make your symptoms worse, such as:  Caffeine or alcoholic drinks  Chocolate  Peppermint or mint flavorings  Garlic and onions  Spicy foods  Citrus fruits, such as oranges, lemons, or limes  Tomato-based foods such as sauce, chili, salsa and pizza  Fried and fatty foods  Avoid lying down for 3 hours prior to your bedtime or prior to taking a nap  Eat small, frequent meals instead of a large meals  Wear loose-fitting clothing.  Do not wear anything tight  around your waist that causes pressure on your stomach.  Raise the head of your bed 6 to 8 inches with wood blocks to help you sleep.  Extra pillows will not help.  Seek Help Right Away If:  You have pain in your arms, neck, jaw, teeth or back  Your pain increases or changes in intensity or duration  You develop nausea, vomiting or sweating (diaphoresis)  You develop shortness of breath or you faint  Your vomit is green, yellow, black or looks like coffee grounds or blood  Your stool is red, bloody or black  These symptoms could be signs of other problems, such as heart disease, gastric bleeding or esophageal bleeding.  Make sure you :  Understand these instructions.  Will watch your condition.  Will get help right away if you are not doing well or get worse.  Your e-visit answers were reviewed by a board certified advanced clinical practitioner to complete your personal care plan.  Depending on the condition, your plan could have included both over the counter or prescription medications.  If there is a problem please reply  once you have received a response from your provider.  Your safety is important to [place].  If you have drug allergies check your prescription carefully.    You can use MyChart to ask questions about today's visit, request a non-urgent call back, or ask for a work or school excuse for 24  hours related to this e-Visit. If it has been greater than 24 hours you will need to follow up with your provider, or enter a new e-Visit to address those concerns.  You will get an e-mail in the next two days asking about your experience.  I hope that your e-visit has been valuable and will speed your recovery. Thank you for using e-visits.

## 2018-03-12 ENCOUNTER — Telehealth: Payer: Self-pay | Admitting: Family Medicine

## 2018-03-12 ENCOUNTER — Ambulatory Visit (INDEPENDENT_AMBULATORY_CARE_PROVIDER_SITE_OTHER): Payer: No Typology Code available for payment source | Admitting: Family Medicine

## 2018-03-12 ENCOUNTER — Ambulatory Visit: Payer: Self-pay

## 2018-03-12 ENCOUNTER — Encounter: Payer: Self-pay | Admitting: Family Medicine

## 2018-03-12 ENCOUNTER — Ambulatory Visit
Admission: RE | Admit: 2018-03-12 | Discharge: 2018-03-12 | Disposition: A | Discharge status: Home / Self Care | Payer: No Typology Code available for payment source | Source: Ambulatory Visit | Attending: Family Medicine | Admitting: Family Medicine

## 2018-03-12 VITALS — BP 116/79 | HR 78 | Temp 98.5°F | Wt 159.4 lb

## 2018-03-12 DIAGNOSIS — R1011 Right upper quadrant pain: Secondary | ICD-10-CM | POA: Insufficient documentation

## 2018-03-12 LAB — CBC WITH DIFFERENTIAL/PLATELET
HEMATOCRIT: 36.8 % (ref 34.0–46.6)
Hemoglobin: 12.9 g/dL (ref 11.1–15.9)
LYMPHS ABS: 2.2 10*3/uL (ref 0.7–3.1)
Lymphs: 30 %
MCH: 31.5 pg (ref 26.6–33.0)
MCHC: 35.1 g/dL (ref 31.5–35.7)
MCV: 90 fL (ref 79–97)
MID (ABSOLUTE): 0.8 10*3/uL (ref 0.1–1.6)
MID: 11 %
NEUTROS ABS: 4.2 10*3/uL (ref 1.4–7.0)
NEUTROS PCT: 59 %
Platelets: 197 10*3/uL (ref 150–379)
RBC: 4.1 x10E6/uL (ref 3.77–5.28)
RDW: 13.2 % (ref 12.3–15.4)
WBC: 7.2 10*3/uL (ref 3.4–10.8)

## 2018-03-12 MED ORDER — OMEPRAZOLE 40 MG PO CPDR: 40.0000 mg | DELAYED_RELEASE_CAPSULE | Freq: Every day | ORAL | 3 refills | Status: DC

## 2018-03-12 NOTE — Telephone Encounter (Signed)
Pt. Reports abdominal pain started 3 days ago. Pain comes an goes. It was worse last night. States she is afraid it is her gallbladder. Has bloating and diarrhea as well. Reports she had "aneurysm" surgery 03/05/18. Appointment made for today. Answer Assessment - Initial Assessment Questions 1. LOCATION: "Where does it hurt?"      Right upper quad 2. RADIATION: "Does the pain shoot anywhere else?" (e.g., chest, back)     Back 3. ONSET: "When did the pain begin?" (e.g., minutes, hours or days ago)      3 Days ago - got worse last night 4. SUDDEN: "Gradual or sudden onset?"     Gradual 5. PATTERN "Does the pain come and go, or is it constant?"    - If constant: "Is it getting better, staying the same, or worsening?"      (Note: Constant means the pain never goes away completely; most serious pain is constant and it progresses)     - If intermittent: "How long does it last?" "Do you have pain now?"     (Note: Intermittent means the pain goes away completely between bouts)     Comes and goes 6. SEVERITY: "How bad is the pain?"  (e.g., Scale 1-10; mild, moderate, or severe)   - MILD (1-3): doesn't interfere with normal activities, abdomen soft and not tender to touch    - MODERATE (4-7): interferes with normal activities or awakens from sleep, tender to touch    - SEVERE (8-10): excruciating pain, doubled over, unable to do any normal activities      8 7. RECURRENT SYMPTOM: "Have you ever had this type of abdominal pain before?" If so, ask: "When was the last time?" and "What happened that time?"      No 8. CAUSE: "What do you think is causing the abdominal pain?"     Gallbladder 9. RELIEVING/AGGRAVATING FACTORS: "What makes it better or worse?" (e.g., movement, antacids, bowel movement)     Walking helps 10. OTHER SYMPTOMS: "Has there been any vomiting, diarrhea, constipation, or urine problems?"       Bloating and diarrhea 11. PREGNANCY: "Is there any chance you are pregnant?" "When was your  last menstrual period?"       No  Protocols used: ABDOMINAL PAIN - [facility]

## 2018-03-12 NOTE — Progress Notes (Signed)
BP 116/79 (BP Location: Left Arm, Patient Position: Sitting, Cuff Size: Normal)   Pulse 78   Temp 98.5 F (36.9 C)   Wt 159 lb 6 oz (72.3 kg)   LMP 02/22/2018   SpO2 100%   BMI 29.15 kg/m    Subjective:    Patient ID: Ashley Wiley, female    DOB: [birth year], 42 y.o.   MRN: [id number]  HPI: [name] is a 42 y.o. female  Chief Complaint  Patient presents with  . Abdominal Pain   ABDOMINAL PAIN  Duration: last 3-4 days, coming and going, really bad last night Onset: gradual Severity: 8/10 Quality: dull and swollen Location:  RUQ  Episode duration:  Radiation: yes- into her back Frequency: 6 hours Alleviating factors: hot tea Aggravating factors: laying flat Status: better Treatments attempted: tums Fever: no Nausea: yes Vomiting: no Weight loss: yes Decreased appetite: yes Diarrhea: yes Constipation: no Blood in stool: no Heartburn: yes Jaundice: no Rash: no Dysuria/urinary frequency: no Hematuria: no History of sexually transmitted disease: no Recurrent NSAID use: no  Relevant past medical, surgical, family and social history reviewed and updated as indicated. Interim medical history since our last visit reviewed. Allergies and medications reviewed and updated.  Review of Systems  Constitutional: Positive for appetite change, chills, diaphoresis, fatigue and unexpected weight change. Negative for activity change and fever.  HENT: Negative.   Respiratory: Negative.   Cardiovascular: Negative.   Gastrointestinal: Positive for abdominal pain, diarrhea and nausea. Negative for abdominal distention, anal bleeding, blood in stool, constipation, rectal pain and vomiting.  Genitourinary: Negative.   Musculoskeletal: Negative.   Skin: Negative.   Hematological: Negative.   Psychiatric/Behavioral: Negative.     Per HPI unless specifically indicated above     Objective:    BP 116/79 (BP Location: Left Arm, Patient Position: Sitting, Cuff Size:  Normal)   Pulse 78   Temp 98.5 F (36.9 C)   Wt 159 lb 6 oz (72.3 kg)   LMP 02/22/2018   SpO2 100%   BMI 29.15 kg/m   Wt Readings from Last 3 Encounters:  03/12/18 159 lb 6 oz (72.3 kg)  03/05/18 162 lb 0.6 oz (73.5 kg)  03/04/18 162 lb 9.6 oz (73.8 kg)    Physical Exam  Constitutional: She is oriented to person, place, and time. She appears well-developed and well-nourished. She appears ill. No distress.  HENT:  Head: Normocephalic and atraumatic.  Right Ear: Hearing normal.  Left Ear: Hearing normal.  Nose: Nose normal.  Eyes: Conjunctivae and lids are normal. Right eye exhibits no discharge. Left eye exhibits no discharge. No scleral icterus.  Cardiovascular: Normal rate, regular rhythm, normal heart sounds and intact distal pulses. Exam reveals no gallop and no friction rub.  No murmur heard. Pulmonary/Chest: Effort normal and breath sounds normal. No stridor. No respiratory distress. She has no wheezes. She has no rhonchi. She has no rales. She exhibits no tenderness.  Abdominal: Normal appearance and bowel sounds are normal. She exhibits no shifting dullness, no distension, no pulsatile liver, no fluid wave, no abdominal bruit, no ascites, no pulsatile midline mass and no mass. There is no hepatosplenomegaly, splenomegaly or hepatomegaly. There is tenderness in the right upper quadrant. There is positive Murphy's sign. There is no rigidity, no rebound, no guarding, no CVA tenderness and no tenderness at McBurney's point. No hernia. Hernia confirmed negative in the ventral area, confirmed negative in the right inguinal area and confirmed negative in the left inguinal area.  Musculoskeletal: Normal range of motion.  Neurological: She is alert and oriented to person, place, and time.  Skin: Skin is warm, dry and intact. Capillary refill takes less than 2 seconds. No rash noted. She is not diaphoretic. No cyanosis or erythema. No pallor.  Psychiatric: She has a normal mood and affect.  Her speech is normal and behavior is normal. Judgment and thought content normal. Cognition and memory are normal.    Results for orders placed or performed during the hospital encounter of 03/05/18  MRSA PCR Screening  Result Value Ref Range   MRSA by PCR NEGATIVE NEGATIVE  Glucose, capillary  Result Value Ref Range   Glucose-Capillary 105 (H) 65 - 99 mg/dL   Comment 1 Notify RN    Comment 2 Document in Chart   Glucose, capillary  Result Value Ref Range   Glucose-Capillary 253 (H) 65 - 99 mg/dL  Glucose, capillary  Result Value Ref Range   Glucose-Capillary 135 (H) 65 - 99 mg/dL   Comment 1 Notify RN    Comment 2 Document in Chart   Glucose, capillary  Result Value Ref Range   Glucose-Capillary 162 (H) 65 - 99 mg/dL   Comment 1 Notify RN    Comment 2 Document in Chart   Glucose, capillary  Result Value Ref Range   Glucose-Capillary 141 (H) 65 - 99 mg/dL   Comment 1 Notify RN    Comment 2 Document in Chart   Glucose, capillary  Result Value Ref Range   Glucose-Capillary 162 (H) 65 - 99 mg/dL  Glucose, capillary  Result Value Ref Range   Glucose-Capillary 93 65 - 99 mg/dL  Pregnancy, urine POC  Result Value Ref Range   Preg Test, Ur NEGATIVE NEGATIVE      Assessment & Plan:   Problem List Items Addressed This Visit    None    Visit Diagnoses    Right upper quadrant abdominal pain    -  Primary   +Murphy's sign. Pain radiating to her back. Will get stat US of RUQ. Await results. Call with any concerns.    Relevant Orders   CBC With Differential/Platelet   US Abdomen Limited RUQ       Follow up plan: Return Pending results.

## 2018-03-12 NOTE — Telephone Encounter (Signed)
I called to let her know that her [place] came back normal. Will start her omeprazole and recheck in 2 weeks. Call with any concerns.   Please call and schedule 2 weeks follow up.

## 2018-03-12 NOTE — Telephone Encounter (Signed)
FYI

## 2018-03-21 ENCOUNTER — Other Ambulatory Visit: Payer: Self-pay | Admitting: *Deleted

## 2018-03-21 NOTE — Patient Outreach (Signed)
[place] [facility]) Care Management  03/21/2018  Ashley Wiley Mar 13, 1976 [id number]   Subjective: Telephone call to patient's home  / mobile number, no answer, left HIPAA compliant voicemail message, and requested call back.     Objective: Per KPN (Knowledge Performance Now, point of care tool) and chart review, patient hospitalized 03/05/18 -03/07/18 for Nonruptured cerebral aneurysm.    Patient has a history of menstrual migraines, fibroid,  Vitamin D deficiency disease, Dermatofibroma, Pinguecula, and Thyroid nodule.     Assessment: Received [facility] Transition of care referral on 03/19/18.    Transition of care follow up pending patient contact.       Plan: RNCM will send unsuccessful outreach  letter, [facility] pamphlet, will call patient for 2nd telephone outreach attempt, transition of care follow up, and proceed with case closure, within 10 business days if no return call.       Laramie Meissner H. Annia Friendly, BSN, [place] Management [facility] Telephonic CM Phone: [phone] Fax: [phone]

## 2018-03-26 ENCOUNTER — Encounter: Payer: No Typology Code available for payment source | Admitting: Family Medicine

## 2018-03-26 ENCOUNTER — Other Ambulatory Visit: Payer: Self-pay

## 2018-03-26 NOTE — Patient Outreach (Signed)
[place] [facility]) Care Management  03/26/2018  Ashley Wiley 1976/11/10 [id number]   Telephone call for transition of care call 2nd attempt.  Member was hospitalized for non-ruptured cerebral aneurysm, arteriogram with coil embolization.  Discharged on 03/07/18.  Referral received on 03/19/18.  Member states she is at work and can not talk at this time.  Requests return call tomorrow at lunch time Plan to call tomorrow at 12 noon per her request.  Peter Garter RN, [facility] Care Management Coordinator [facility] Care Management [phone]

## 2018-03-27 ENCOUNTER — Encounter: Payer: Self-pay | Admitting: Family Medicine

## 2018-03-27 ENCOUNTER — Other Ambulatory Visit: Payer: Self-pay

## 2018-03-27 NOTE — Patient Outreach (Signed)
[place] [facility]) Care Management  03/27/2018  MITZY NARON [birth year] [id number]   Telephone call for transition of care call 2nd attempt.  Member was hospitalized for non-ruptured cerebral aneurysm, arteriogram with coil embolization.  Discharged on 03/07/18.  Referral received on 03/19/18.  No answer and HIPAA compliant message left. Plan for 3rd outreach call in 3-4 business days and will close case after 10 business days if no response to call or outreach letter.  Peter Garter RN, [facility] Care Management Coordinator [facility] Care Management [phone]

## 2018-03-29 ENCOUNTER — Encounter: Payer: No Typology Code available for payment source | Admitting: Family Medicine

## 2018-04-01 ENCOUNTER — Ambulatory Visit (INDEPENDENT_AMBULATORY_CARE_PROVIDER_SITE_OTHER): Payer: No Typology Code available for payment source | Admitting: Family Medicine

## 2018-04-01 ENCOUNTER — Encounter: Payer: Self-pay | Admitting: Family Medicine

## 2018-04-01 VITALS — BP 144/98 | HR 92 | Ht 62.0 in | Wt 161.0 lb

## 2018-04-01 DIAGNOSIS — E041 Nontoxic single thyroid nodule: Secondary | ICD-10-CM | POA: Diagnosis not present

## 2018-04-01 DIAGNOSIS — E559 Vitamin D deficiency, unspecified: Secondary | ICD-10-CM

## 2018-04-01 DIAGNOSIS — R740 Nonspecific elevation of levels of transaminase and lactic acid dehydrogenase [LDH]: Secondary | ICD-10-CM | POA: Diagnosis not present

## 2018-04-01 DIAGNOSIS — R7301 Impaired fasting glucose: Secondary | ICD-10-CM

## 2018-04-01 DIAGNOSIS — G43009 Migraine without aura, not intractable, without status migrainosus: Secondary | ICD-10-CM

## 2018-04-01 DIAGNOSIS — R7401 Elevation of levels of liver transaminase levels: Secondary | ICD-10-CM

## 2018-04-01 DIAGNOSIS — E782 Mixed hyperlipidemia: Secondary | ICD-10-CM

## 2018-04-01 DIAGNOSIS — F332 Major depressive disorder, recurrent severe without psychotic features: Secondary | ICD-10-CM

## 2018-04-01 LAB — UA/M W/RFLX CULTURE, ROUTINE
Bilirubin, UA: NEGATIVE
Glucose, UA: NEGATIVE
KETONES UA: NEGATIVE
LEUKOCYTES UA: NEGATIVE
NITRITE UA: NEGATIVE
Protein, UA: NEGATIVE
RBC, UA: NEGATIVE
Specific Gravity, UA: 1.01 (ref 1.005–1.030)
Urobilinogen, Ur: 0.2 mg/dL (ref 0.2–1.0)
pH, UA: 7 (ref 5.0–7.5)

## 2018-04-01 LAB — BAYER DCA HB A1C WAIVED: HB A1C: 6.1 % (ref <7.0)

## 2018-04-01 MED ORDER — NORTRIPTYLINE HCL 25 MG PO CAPS: 25.0000 mg | ORAL_CAPSULE | Freq: Every day | ORAL | 0 refills | Status: DC

## 2018-04-01 MED ORDER — TOPIRAMATE 50 MG PO TABS: ORAL_TABLET | ORAL | 3 refills | Status: DC

## 2018-04-01 MED ORDER — SUMATRIPTAN SUCCINATE 100 MG PO TABS: 100.0000 mg | ORAL_TABLET | ORAL | 12 refills | Status: DC | PRN

## 2018-04-01 NOTE — Assessment & Plan Note (Signed)
Rechecking levels today. Await results. Call with any concerns.  

## 2018-04-01 NOTE — Progress Notes (Signed)
BP (!) 144/98   Pulse 92   Ht 5\' 2"  (1.575 m)   Wt 161 lb (73 kg)   SpO2 98%   BMI 29.45 kg/m    Subjective:    Patient ID: Ashley Wiley, female    DOB: 1976/07/15, 42 y.o.   MRN: [id number]  HPI: Ashley Wiley is a 42 y.o. female  Chief Complaint  Patient presents with  . Headache    3-4 h/a days per week, Lasts all day, sensitive to light, no relief w/ OTC medication   MIGRAINES Duration: November Onset: sudden Severity: severe Quality: sharp and throbbing Frequency: constant Location: On the R side of her face, right above her eye and around her cheek Headache duration: over a day Radiation: into her face Time of day headache occurs: wakes up with them/later in the afternoon Alleviating factors: sleep Aggravating factors: light and screens at work  Headache status at time of visit: current headache Treatments attempted: Treatments attempted: rest, ice, heat, APAP, ibuprofen and aleve", excedrine, tramadol, hydrocodone   Aura: no Nausea:  yes Vomiting: no Photophobia:  yes Phonophobia:  yes Effect on social functioning:  yes Numbers of missed days of school/work each month: 1x a month Confusion:  yes Gait disturbance/ataxia:  no Behavioral changes:  no Fevers:  no  Relevant past medical, surgical, family and social history reviewed and updated as indicated. Interim medical history since our last visit reviewed. Allergies and medications reviewed and updated.  Review of Systems  Constitutional: Negative.   Respiratory: Negative.   Cardiovascular: Negative.   Musculoskeletal: Negative.   Neurological: Positive for headaches. Negative for dizziness, tremors, seizures, syncope, facial asymmetry, speech difficulty, weakness, light-headedness and numbness.  Hematological: Negative.   Psychiatric/Behavioral: Negative.     Per HPI unless specifically indicated above     Objective:    BP (!) 144/98   Pulse 92   Ht 5\' 2"  (1.575 m)   Wt 161 lb (73  kg)   SpO2 98%   BMI 29.45 kg/m   Wt Readings from Last 3 Encounters:  04/01/18 161 lb (73 kg)  03/12/18 159 lb 6 oz (72.3 kg)  03/05/18 162 lb 0.6 oz (73.5 kg)    Physical Exam  Constitutional: She is oriented to person, place, and time. She appears well-developed and well-nourished. No distress.  HENT:  Head: Normocephalic and atraumatic.  Right Ear: Hearing normal.  Left Ear: Hearing normal.  Nose: Nose normal.  Mouth/Throat: Oropharynx is clear and moist.  Eyes: Conjunctivae, EOM and lids are normal. Right eye exhibits no discharge. Left eye exhibits no discharge. No scleral icterus.  Neck: Normal range of motion. Neck supple.  Cardiovascular: Normal rate, regular rhythm, normal heart sounds and intact distal pulses. Exam reveals no gallop and no friction rub.  No murmur heard. Pulmonary/Chest: Effort normal and breath sounds normal. No stridor. No respiratory distress. She has no wheezes. She has no rales. She exhibits no tenderness.  Musculoskeletal: Normal range of motion.  Neurological: She is alert and oriented to person, place, and time.  Skin: Skin is intact. No rash noted.  Psychiatric: She has a normal mood and affect. Her speech is normal and behavior is normal. Judgment and thought content normal. Cognition and memory are normal.  Nursing note and vitals reviewed.   Results for orders placed or performed in visit on 03/12/18  CBC With Differential/Platelet  Result Value Ref Range   WBC 7.2 3.4 - 10.8 x10E3/uL   RBC 4.10 3.77 -  5.28 x10E6/uL   Hemoglobin 12.9 11.1 - 15.9 g/dL   Hematocrit 36.8 34.0 - 46.6 %   MCV 90 79 - 97 fL   MCH 31.5 26.6 - 33.0 pg   MCHC 35.1 31.5 - 35.7 g/dL   RDW 13.2 12.3 - 15.4 %   Platelets 197 150 - 379 x10E3/uL   Neutrophils 59 Not Estab. %   Lymphs 30 Not Estab. %   MID 11 Not Estab. %   Neutrophils Absolute 4.2 1.4 - 7.0 x10E3/uL   Lymphocytes Absolute 2.2 0.7 - 3.1 x10E3/uL   MID (Absolute) 0.8 0.1 - 1.6 X10E3/uL        Assessment & Plan:   Problem List Items Addressed This Visit      Cardiovascular and Mediastinum   Migraine without aura and without status migrainosus, not intractable - Primary    No better on nortripyline. Will start topamax and imitrex. Recheck 1 month. Call with any concerns.       Relevant Medications   nortriptyline (PAMELOR) 25 MG capsule   topiramate (TOPAMAX) 50 MG tablet   SUMAtriptan (IMITREX) 100 MG tablet   Other Relevant Orders   CBC with Differential/Platelet   Comprehensive metabolic panel   TSH   UA/M w/rflx Culture, Routine     Endocrine   IFG (impaired fasting glucose)    Sugars have been trending up. Rechecking levels today. Await results.       Relevant Orders   CBC with Differential/Platelet   Bayer DCA Hb A1c Waived   TSH   UA/M w/rflx Culture, Routine   Thyroid nodule    Rechecking TSH today. Await results.       Relevant Orders   CBC with Differential/Platelet   TSH   UA/M w/rflx Culture, Routine     Other   Hyperlipidemia    Rechecking levels today. Await results. Call with any concerns.       Relevant Orders   CBC with Differential/Platelet   Lipid Panel w/o Chol/HDL Ratio   TSH   UA/M w/rflx Culture, Routine   Vitamin D deficiency disease    Rechecking levels today. Await results. Call with any concerns.       Relevant Orders   CBC with Differential/Platelet   TSH   UA/M w/rflx Culture, Routine   VITAMIN D 25 Hydroxy (Vit-D Deficiency, Fractures)   Depression   Relevant Medications   nortriptyline (PAMELOR) 25 MG capsule   Other Relevant Orders   CBC with Differential/Platelet   TSH   UA/M w/rflx Culture, Routine   Elevated ALT measurement    Rechecking levels today. Await results. Call with any concerns.       Relevant Orders   CBC with Differential/Platelet   TSH   UA/M w/rflx Culture, Routine       Follow up plan: Return Monday for Physical.

## 2018-04-01 NOTE — Assessment & Plan Note (Signed)
No better on nortripyline. Will start topamax and imitrex. Recheck 1 month. Call with any concerns.

## 2018-04-01 NOTE — Assessment & Plan Note (Signed)
Sugars have been trending up. Rechecking levels today. Await results.

## 2018-04-01 NOTE — Assessment & Plan Note (Signed)
Rechecking TSH today. Await results.

## 2018-04-02 ENCOUNTER — Other Ambulatory Visit: Payer: Self-pay | Admitting: Family Medicine

## 2018-04-02 LAB — CBC WITH DIFFERENTIAL/PLATELET
BASOS: 0 %
Basophils Absolute: 0 10*3/uL (ref 0.0–0.2)
EOS (ABSOLUTE): 0.4 10*3/uL (ref 0.0–0.4)
EOS: 5 %
HEMATOCRIT: 37.9 % (ref 34.0–46.6)
Hemoglobin: 12.9 g/dL (ref 11.1–15.9)
Immature Grans (Abs): 0.1 10*3/uL (ref 0.0–0.1)
Immature Granulocytes: 1 %
LYMPHS ABS: 2.4 10*3/uL (ref 0.7–3.1)
Lymphs: 27 %
MCH: 30.8 pg (ref 26.6–33.0)
MCHC: 34 g/dL (ref 31.5–35.7)
MCV: 91 fL (ref 79–97)
MONOS ABS: 0.4 10*3/uL (ref 0.1–0.9)
Monocytes: 4 %
NEUTROS ABS: 5.4 10*3/uL (ref 1.4–7.0)
NEUTROS PCT: 63 %
PLATELETS: 216 10*3/uL (ref 150–379)
RBC: 4.19 x10E6/uL (ref 3.77–5.28)
RDW: 13.3 % (ref 12.3–15.4)
WBC: 8.7 10*3/uL (ref 3.4–10.8)

## 2018-04-02 LAB — COMPREHENSIVE METABOLIC PANEL
ALT: 28 IU/L (ref 0–32)
AST: 22 IU/L (ref 0–40)
Albumin/Globulin Ratio: 1.9 (ref 1.2–2.2)
Albumin: 4.4 g/dL (ref 3.5–5.5)
Alkaline Phosphatase: 83 IU/L (ref 39–117)
BUN/Creatinine Ratio: 14 (ref 9–23)
BUN: 9 mg/dL (ref 6–24)
Bilirubin Total: 0.4 mg/dL (ref 0.0–1.2)
CHLORIDE: 102 mmol/L (ref 96–106)
CO2: 24 mmol/L (ref 20–29)
Calcium: 9.5 mg/dL (ref 8.7–10.2)
Creatinine, Ser: 0.66 mg/dL (ref 0.57–1.00)
GFR calc non Af Amer: 111 mL/min/{1.73_m2} (ref >59)
GFR, EST AFRICAN AMERICAN: 128 mL/min/{1.73_m2} (ref >59)
Globulin, Total: 2.3 g/dL (ref 1.5–4.5)
Glucose: 104 mg/dL — ABNORMAL HIGH (ref 65–99)
Potassium: 4.3 mmol/L (ref 3.5–5.2)
Sodium: 140 mmol/L (ref 134–144)
TOTAL PROTEIN: 6.7 g/dL (ref 6.0–8.5)

## 2018-04-02 LAB — LIPID PANEL W/O CHOL/HDL RATIO
CHOLESTEROL TOTAL: 184 mg/dL (ref 100–199)
HDL: 38 mg/dL — AB (ref >39)
LDL Calculated: 93 mg/dL (ref 0–99)
TRIGLYCERIDES: 265 mg/dL — AB (ref 0–149)
VLDL CHOLESTEROL CAL: 53 mg/dL — AB (ref 5–40)

## 2018-04-02 LAB — TSH: TSH: 1.93 u[IU]/mL (ref 0.450–4.500)

## 2018-04-02 LAB — VITAMIN D 25 HYDROXY (VIT D DEFICIENCY, FRACTURES): VIT D 25 HYDROXY: 18.3 ng/mL — AB (ref 30.0–100.0)

## 2018-04-02 MED ORDER — VITAMIN D (ERGOCALCIFEROL) 1.25 MG (50000 UNIT) PO CAPS: 50000.0000 [IU] | ORAL_CAPSULE | ORAL | 0 refills | Status: DC

## 2018-04-03 ENCOUNTER — Encounter: Payer: Self-pay | Admitting: Family Medicine

## 2018-04-03 ENCOUNTER — Other Ambulatory Visit: Payer: Self-pay

## 2018-04-03 ENCOUNTER — Emergency Department: Payer: No Typology Code available for payment source

## 2018-04-03 ENCOUNTER — Emergency Department
Admission: EM | Admit: 2018-04-03 | Discharge: 2018-04-03 | Disposition: A | Discharge status: Home / Self Care | Payer: No Typology Code available for payment source | Attending: Emergency Medicine | Admitting: Emergency Medicine

## 2018-04-03 ENCOUNTER — Other Ambulatory Visit: Payer: Self-pay | Admitting: *Deleted

## 2018-04-03 DIAGNOSIS — T887XXA Unspecified adverse effect of drug or medicament, initial encounter: Secondary | ICD-10-CM | POA: Diagnosis not present

## 2018-04-03 DIAGNOSIS — T50905A Adverse effect of unspecified drugs, medicaments and biological substances, initial encounter: Secondary | ICD-10-CM | POA: Insufficient documentation

## 2018-04-03 DIAGNOSIS — Y69 Unspecified misadventure during surgical and medical care: Secondary | ICD-10-CM | POA: Insufficient documentation

## 2018-04-03 DIAGNOSIS — R51 Headache: Secondary | ICD-10-CM | POA: Diagnosis not present

## 2018-04-03 DIAGNOSIS — R079 Chest pain, unspecified: Secondary | ICD-10-CM | POA: Insufficient documentation

## 2018-04-03 DIAGNOSIS — E119 Type 2 diabetes mellitus without complications: Secondary | ICD-10-CM | POA: Diagnosis not present

## 2018-04-03 LAB — TROPONIN I

## 2018-04-03 LAB — BASIC METABOLIC PANEL
Anion gap: 10 (ref 5–15)
BUN: 11 mg/dL (ref 6–20)
CALCIUM: 9.6 mg/dL (ref 8.9–10.3)
CO2: 24 mmol/L (ref 22–32)
CREATININE: 0.52 mg/dL (ref 0.44–1.00)
Chloride: 99 mmol/L — ABNORMAL LOW (ref 101–111)
GFR calc non Af Amer: >60 mL/min (ref >60)
Glucose, Bld: 101 mg/dL — ABNORMAL HIGH (ref 65–99)
Potassium: 4.1 mmol/L (ref 3.5–5.1)
Sodium: 133 mmol/L — ABNORMAL LOW (ref 135–145)

## 2018-04-03 LAB — POCT PREGNANCY, URINE: PREG TEST UR: NEGATIVE

## 2018-04-03 LAB — CBC
HCT: 37.9 % (ref 35.0–47.0)
Hemoglobin: 13 g/dL (ref 12.0–16.0)
MCH: 30.6 pg (ref 26.0–34.0)
MCHC: 34.2 g/dL (ref 32.0–36.0)
MCV: 89.4 fL (ref 80.0–100.0)
PLATELETS: 200 10*3/uL (ref 150–440)
RBC: 4.24 MIL/uL (ref 3.80–5.20)
RDW: 13.1 % (ref 11.5–14.5)
WBC: 7.7 10*3/uL (ref 3.6–11.0)

## 2018-04-03 NOTE — ED Provider Notes (Signed)
[facility] Emergency Department Provider Note       Time seen: ----------------------------------------- 12:30 PM on 04/03/2018 -----------------------------------------   I have reviewed the triage vital signs and the nursing notes.  HISTORY   Chief Complaint Chest Pain and Jaw Pain    HPI Ashley Wiley is a 42 y.o. female with a history of allergies, anxiety, depression, diabetes, GERD, hyperlipidemia who presents to the ED for central chest tightness with bilateral jaw pain that began around 1045 this morning.  Patient had a headache this morning as well and took Imitrex for the first time this morning.  Symptoms started 15 minutes after that.  She denies any complaints at this time.  Past Medical History:  Diagnosis Date  . Allergy   . Aneurysm ([place])   . Anxiety   . Depression   . Dermatofibroma   . Diabetes mellitus without complication ([place])    Takes Metformin  dx 2016  . Facial pain   . GERD (gastroesophageal reflux disease)   . Headache   . Hyperlipidemia   . IFG (impaired fasting glucose)   . Low HDL (under 40)   . Overweight   . Patella-femoral syndrome   . Pinguecula   . Thyroid nodule april 2016  . Vitamin D deficiency disease     Patient Active Problem List   Diagnosis Date Noted  . Migraine without aura and without status migrainosus, not intractable 04/01/2018  . Cerebral aneurysm, nonruptured 03/05/2018  . Fibroid 02/08/2018  . Menstrual migraine without status migrainosus, not intractable 02/08/2018  . Menorrhagia with regular cycle 02/08/2018  . Acute anxiety 01/13/2016  . History of HPV infection 01/13/2016  . Elevated ALT measurement 10/05/2015  . Allergic rhinitis 07/13/2015  . Thyroid nodule 07/13/2015  . Obesity 05/14/2015  . IFG (impaired fasting glucose)   . Hyperlipidemia   . Patella-femoral syndrome   . Vitamin D deficiency disease   . Dermatofibroma   . Depression   . Pinguecula     Past Surgical  History:  Procedure Laterality Date  . CARPAL TUNNEL RELEASE    . GANGLION CYST EXCISION Left   . IR 3D INDEPENDENT WKST  12/04/2017  . IR ANGIO INTRA EXTRACRAN SEL INTERNAL CAROTID BILAT MOD SED  12/04/2017  . IR ANGIO INTRA EXTRACRAN SEL INTERNAL CAROTID UNI L MOD SED  03/05/2018  . IR ANGIO VERTEBRAL SEL VERTEBRAL BILAT MOD SED  12/04/2017  . IR ANGIOGRAM FOLLOW UP STUDY  03/05/2018  . IR ANGIOGRAM FOLLOW UP STUDY  03/05/2018  . IR ANGIOGRAM FOLLOW UP STUDY  03/05/2018  . IR NEURO EACH ADD'L AFTER BASIC UNI LEFT (MS)  03/05/2018  . IR TRANSCATH/EMBOLIZ  03/05/2018  . NASAL SINUS SURGERY  Nov 2015  . RADIOLOGY WITH ANESTHESIA N/A 03/05/2018   Procedure: Arteriogram, coil embolization, possible stenting;  Surgeon: Consuella Lose, MD;  Location: [place];  Service: Radiology;  Laterality: N/A;  . TUBAL LIGATION      Allergies Wellbutrin [bupropion]  Social History Social History   Tobacco Use  . Smoking status: Never Smoker  . Smokeless tobacco: Never Used  Substance Use Topics  . Alcohol use: Not Currently    Alcohol/week: 0.0 - 0.6 oz    Comment: on occasion  . Drug use: No   Review of Systems Constitutional: Negative for fever. Eyes: Negative for vision changes ENT:  Negative for congestion, sore throat Cardiovascular: Positive for chest pain Respiratory: Negative for shortness of breath. Gastrointestinal: Negative for abdominal pain, vomiting and  diarrhea. Musculoskeletal: Negative for back pain. Skin: Negative for rash. Neurological: Positive for headache, paresthesias  All systems negative/normal/unremarkable except as stated in the HPI  ____________________________________________   PHYSICAL EXAM:  VITAL SIGNS: ED Triage Vitals [04/03/18 1130]  Enc Vitals Group     BP (!) 180/107     Pulse Rate 81     Resp 18     Temp 97.9 F (36.6 C)     Temp Source Oral     SpO2 98 %     Weight 160 lb (72.6 kg)     Height 5\' 2"  (1.575 m)     Head Circumference       Peak Flow      Pain Score 8     Pain Loc      Pain Edu?      Excl. in [place]?    Constitutional: Alert and oriented. Well appearing and in no distress. Eyes: Conjunctivae are normal. Normal extraocular movements. ENT   Head: Normocephalic and atraumatic.   Nose: No congestion/rhinnorhea.   Mouth/Throat: Mucous membranes are moist.   Neck: No stridor.  Cardiovascular: Normal rate, regular rhythm. No murmurs, rubs, or gallops. Respiratory: Normal respiratory effort without tachypnea nor retractions. Breath sounds are clear and equal bilaterally. No wheezes/rales/rhonchi. Gastrointestinal: Soft and nontender. Normal bowel sounds Musculoskeletal: Nontender with normal range of motion in extremities. No lower extremity tenderness nor edema. Neurologic:  Normal speech and language. No gross focal neurologic deficits are appreciated.  Skin:  Skin is warm, dry and intact. No rash noted. Psychiatric: Mood and affect are normal. Speech and behavior are normal.  ____________________________________________  EKG: Interpreted by me.  Sinus rhythm rate 94 bpm, normal PR interval, normal QRS, normal QT.  ____________________________________________  ED COURSE:  As part of my medical decision making, I reviewed the following data within the [place] History obtained from family if available, nursing notes, old chart and ekg, as well as notes from prior ED visits. Patient presented for chest pain after taking Imitrex, we will assess with labs and imaging as indicated at this time.   Procedures ____________________________________________   LABS (pertinent positives/negatives)  Labs Reviewed  BASIC METABOLIC PANEL - Abnormal; Notable for the following components:      Result Value   Sodium 133 (*)    Chloride 99 (*)    Glucose, Bld 101 (*)    All other components within normal limits  CBC  TROPONIN I  POC URINE PREG, ED  POCT PREGNANCY, URINE     RADIOLOGY Images were viewed by me Chest x-ray is normal CT head  ____________________________________________  DIFFERENTIAL DIAGNOSIS   Medication side effect, GERD, musculoskeletal pain, unstable angina unlikely  FINAL ASSESSMENT AND PLAN  Medication side effect   Plan: The patient had presented for chest pain after taking Imitrex. Patient's labs were reassuring including repeat troponin. Patient's imaging was also reassuring.  She is cleared for outpatient follow-up,  will likely not be taking Imitrex in the future.   Laurence Aly, MD   Note: This note was generated in part or whole with voice recognition software. Voice recognition is usually quite accurate but there are transcription errors that can and very often do occur. I apologize for any typographical errors that were not detected and corrected.     Earleen Newport, MD 04/03/18 [phone]

## 2018-04-03 NOTE — ED Notes (Signed)
First Nurse Note:  Patient complaining of chest pain radiating into her jaws.  Started Sumatatriptan this AM, states pain began after she took a PO dose of same.  EKG done and viewed by Dr. Alfred Levins.

## 2018-04-03 NOTE — ED Triage Notes (Signed)
To ER via POV c/o central chest tightness, bilateral jaw pain at 1045Am. Pt had headache this AM, took her prescribed imitrex for first time this AM. Sx started 15 minutes after. Pt alert and oriented X4, active, cooperative, pt in NAD. RR even and unlabored, color WNL.  Speaks in full and complete sentences, easily.

## 2018-04-03 NOTE — Telephone Encounter (Signed)
Patient notified by Tiffany Reel to go to ER for symptoms she's having.

## 2018-04-03 NOTE — Patient Outreach (Addendum)
[facility]) Care Management  04/03/2018  Ashley Wiley January 05, 1976 [id number]   No response from patient outreach attempts will proceed with case closure.     Objective: Per KPN (Knowledge Performance Now, point of care tool) and chart review, patient hospitalized 03/05/18 -03/07/18 for Nonruptured cerebral aneurysm.    Patient has a history of menstrual migraines, fibroid, Vitamin D deficiency disease, Dermatofibroma, Pinguecula, and Thyroid nodule.     Assessment: Received [facility] Transition of care referral on 03/19/18.    Transition of care follow up not completed due to unable to contact patient and will proceed with case closure.      Plan: Case closure due to unable to reach.     Santiel Topper H. Annia Friendly, BSN, [place] [facility] Telephonic CM Phone: [phone] Fax: [phone]

## 2018-04-08 ENCOUNTER — Ambulatory Visit (INDEPENDENT_AMBULATORY_CARE_PROVIDER_SITE_OTHER): Payer: No Typology Code available for payment source | Admitting: Family Medicine

## 2018-04-08 ENCOUNTER — Encounter: Payer: Self-pay | Admitting: Family Medicine

## 2018-04-08 VITALS — BP 125/85 | HR 97 | Temp 98.7°F | Ht 59.1 in | Wt 162.2 lb

## 2018-04-08 DIAGNOSIS — R5383 Other fatigue: Secondary | ICD-10-CM | POA: Diagnosis not present

## 2018-04-08 DIAGNOSIS — Z1231 Encounter for screening mammogram for malignant neoplasm of breast: Secondary | ICD-10-CM

## 2018-04-08 DIAGNOSIS — Z6832 Body mass index (BMI) 32.0-32.9, adult: Secondary | ICD-10-CM | POA: Diagnosis not present

## 2018-04-08 DIAGNOSIS — R7301 Impaired fasting glucose: Secondary | ICD-10-CM | POA: Diagnosis not present

## 2018-04-08 DIAGNOSIS — E66811 Obesity, class 1: Secondary | ICD-10-CM

## 2018-04-08 DIAGNOSIS — Z0000 Encounter for general adult medical examination without abnormal findings: Secondary | ICD-10-CM | POA: Diagnosis not present

## 2018-04-08 DIAGNOSIS — E6609 Other obesity due to excess calories: Secondary | ICD-10-CM

## 2018-04-08 DIAGNOSIS — Z1239 Encounter for other screening for malignant neoplasm of breast: Secondary | ICD-10-CM

## 2018-04-08 LAB — MICROALBUMIN, URINE WAIVED
CREATININE, URINE WAIVED: 50 mg/dL (ref 10–300)
Microalb, Ur Waived: 10 mg/L (ref 0–19)
Microalb/Creat Ratio: <30 mg/g (ref <30)

## 2018-04-08 LAB — HM DIABETES EYE EXAM

## 2018-04-08 MED ORDER — LIRAGLUTIDE -WEIGHT MANAGEMENT 18 MG/3ML ~~LOC~~ SOPN: 0.6000 mg | PEN_INJECTOR | Freq: Every day | SUBCUTANEOUS | 0 refills | Status: DC

## 2018-04-08 NOTE — Assessment & Plan Note (Signed)
Microalbumin done today. Call with any concerns.

## 2018-04-08 NOTE — Assessment & Plan Note (Signed)
Would like to try saxenda. Has failed wellbutrin- cannot take contrave. Call with any concerns. Recheck 1 month.

## 2018-04-08 NOTE — Progress Notes (Signed)
BP 125/85 (BP Location: Left Arm, Patient Position: Sitting, Cuff Size: Normal)   Pulse 97   Temp 98.7 F (37.1 C)   Ht 4' 11.1" (1.501 m)   Wt 162 lb 4 oz (73.6 kg)   LMP 03/29/2018 Comment: neg preg test 04/03/18  SpO2 100%   BMI 32.66 kg/m    Subjective:    Patient ID: Ashley Wiley, female    DOB: Jun 05, 1976, 42 y.o.   MRN: [id number]  HPI: Ashley Wiley is a 42 y.o. female presenting on 04/08/2018 for comprehensive medical examination. Current medical complaints include:  FATIGUE Duration:  Last week Severity: severe  Onset: sudden Context when symptoms started:  unknown Symptoms improve with rest: yes  Depressive symptoms: yes Stress/anxiety: yes Insomnia: no  Snoring: yes- no changes Observed apnea by bed partner: no Daytime hypersomnolence:no Wakes feeling refreshed: yes History of sleep study: no Dysnea on exertion:  yes Orthopnea/PND: no Chest pain: no Chronic cough: no Lower extremity edema: no Arthralgias:no Myalgias: no Weakness: no- legs feel tired and heavy Rash: no  WEIGHT GAIN Duration: chronic Previous attempts at weight loss: yes- diet and exercise and wellbutrin Complications of obesity: HTN, IFG Peak weight: current Weight loss goal: to be healthy Weight loss to date: None Requesting obesity pharmacotherapy: yes Current weight loss supplements/medications: no Previous weight loss supplements/meds: yes  She currently lives with: husband and kids Menopausal Symptoms: no  Depression Screen done today and results listed below:  Depression screen [facility] 2/9 04/08/2018 12/18/2017 07/17/2017 06/05/2017 05/11/2017  Decreased Interest 0 1 1 1 2   Down, Depressed, Hopeless 0 1 0 1 2  PHQ - 2 Score 0 2 1 2 4   Altered sleeping 3 2 0 2 3  Tired, decreased energy 3 2 1 2 2   Change in appetite 3 2 1 2 3   Feeling bad or failure about yourself  3 1 0 2 1  Trouble concentrating 0 2 0 2 2  Moving slowly or fidgety/restless 3 2 0 2 2  Suicidal  thoughts 0 0 0 0 0  PHQ-9 Score 15 13 3 14 17   Difficult doing work/chores Somewhat difficult - - - -    Past Medical History:  Past Medical History:  Diagnosis Date  . Allergy   . Aneurysm ([place])   . Anxiety   . Depression   . Dermatofibroma   . Diabetes mellitus without complication ([place])    Takes Metformin  dx 2016  . Facial pain   . GERD (gastroesophageal reflux disease)   . Headache   . Hyperlipidemia   . IFG (impaired fasting glucose)   . Low HDL (under 40)   . Overweight   . Patella-femoral syndrome   . Pinguecula   . Thyroid nodule april 2016  . Vitamin D deficiency disease     Surgical History:  Past Surgical History:  Procedure Laterality Date  . CARPAL TUNNEL RELEASE    . GANGLION CYST EXCISION Left   . IR 3D INDEPENDENT WKST  12/04/2017  . IR ANGIO INTRA EXTRACRAN SEL INTERNAL CAROTID BILAT MOD SED  12/04/2017  . IR ANGIO INTRA EXTRACRAN SEL INTERNAL CAROTID UNI L MOD SED  03/05/2018  . IR ANGIO VERTEBRAL SEL VERTEBRAL BILAT MOD SED  12/04/2017  . IR ANGIOGRAM FOLLOW UP STUDY  03/05/2018  . IR ANGIOGRAM FOLLOW UP STUDY  03/05/2018  . IR ANGIOGRAM FOLLOW UP STUDY  03/05/2018  . IR NEURO EACH ADD'L AFTER BASIC UNI LEFT ([place])  03/05/2018  .  IR TRANSCATH/EMBOLIZ  03/05/2018  . NASAL SINUS SURGERY  Nov 2015  . RADIOLOGY WITH ANESTHESIA N/A 03/05/2018   Procedure: Arteriogram, coil embolization, possible stenting;  Surgeon: Consuella Lose, MD;  Location: [facility];  Service: Radiology;  Laterality: N/A;  . TUBAL LIGATION      Medications:  Current Outpatient Medications on File Prior to Visit  Medication Sig  . acetaminophen (TYLENOL) 500 MG tablet Take 1,000 mg by mouth daily as needed for moderate pain or headache.  . cyanocobalamin 1000 MCG tablet Take 1,000 mcg by mouth daily.   . fluticasone (FLONASE) 50 MCG/ACT nasal spray Place 2 sprays into both nostrils daily.  Marland Kitchen glucose blood (BAYER CONTOUR TEST) test strip 1 each by Other route as needed for other. Use as  instructed  . ibuprofen (ADVIL,MOTRIN) 200 MG tablet Take 800 mg by mouth daily as needed for headache or moderate pain.  Marland Kitchen loratadine (CLARITIN) 10 MG tablet Take 10 mg by mouth daily.   . metFORMIN (GLUCOPHAGE-XR) 500 MG 24 hr tablet TAKE 2 TABLETS (1,000 MG TOTAL) BY MOUTH EVERY EVENING.  . nortriptyline (PAMELOR) 25 MG capsule Take 1 capsule (25 mg total) by mouth at bedtime.  Marland Kitchen omeprazole (PRILOSEC) 40 MG capsule Take 1 capsule (40 mg total) by mouth daily.  Marland Kitchen topiramate (TOPAMAX) 50 MG tablet 1 tab qHS for 1 week, then 1 tab BID for 1 week, then 2tabs qHS and 1QAM for 1 week, then 2 tabs BID  . Vitamin D, Ergocalciferol, (DRISDOL) 50000 units CAPS capsule Take 1 capsule (50,000 Units total) by mouth every 7 (seven) days.   No current facility-administered medications on file prior to visit.     Allergies:  Allergies  Allergen Reactions  . Imitrex [Sumatriptan] Other (See Comments)    Chest pain   . Wellbutrin [Bupropion] Other (See Comments)    Increased anxiety and tremors    Social History:  Social History   Socioeconomic History  . Marital status: Married    Spouse name: Not on file  . Number of children: Not on file  . Years of education: Not on file  . Highest education level: Not on file  Occupational History  . Not on file  Social Needs  . Financial resource strain: Not on file  . Food insecurity:    Worry: Not on file    Inability: Not on file  . Transportation needs:    Medical: Not on file    Non-medical: Not on file  Tobacco Use  . Smoking status: Never Smoker  . Smokeless tobacco: Never Used  Substance and Sexual Activity  . Alcohol use: Not Currently    Alcohol/week: 0.0 - 0.6 oz    Comment: on occasion  . Drug use: No  . Sexual activity: Yes    Birth control/protection: None  Lifestyle  . Physical activity:    Days per week: 0 days    Minutes per session: 0 min  . Stress: Only a little  Relationships  . Social connections:    Talks on  phone: More than three times a week    Gets together: More than three times a week    Attends religious service: More than 4 times per year    Active member of club or organization: No    Attends meetings of clubs or organizations: Never    Relationship status: Married  . Intimate partner violence:    Fear of current or ex partner: No    Emotionally abused: No  Physically abused: No    Forced sexual activity: No  Other Topics Concern  . Not on file  Social History Narrative  . Not on file   Social History   Tobacco Use  Smoking Status Never Smoker  Smokeless Tobacco Never Used   Social History   Substance and Sexual Activity  Alcohol Use Not Currently  . Alcohol/week: 0.0 - 0.6 oz   Comment: on occasion    Family History:  Family History  Problem Relation Age of Onset  . Diabetes Father   . Hypertension Father   . Heart disease Father   . Kidney disease Father   . Stroke Father   . Aneurysm Father   . Diabetes Paternal Grandfather     Past medical history, surgical history, medications, allergies, family history and social history reviewed with patient today and changes made to appropriate areas of the chart.   Review of Systems  Constitutional: Positive for malaise/fatigue. Negative for chills, diaphoresis, fever and weight loss.  HENT: Negative.   Eyes: Negative.   Respiratory: Negative.   Cardiovascular: Negative.   Gastrointestinal: Negative.   Genitourinary: Negative.   Musculoskeletal: Negative.   Skin: Negative.   Neurological: Positive for dizziness. Negative for tingling, tremors, sensory change, speech change, focal weakness, seizures, loss of consciousness, weakness and headaches.  Endo/Heme/Allergies: Negative.   Psychiatric/Behavioral: Positive for depression. Negative for hallucinations, memory loss, substance abuse and suicidal ideas. The patient is not nervous/anxious and does not have insomnia.     All other ROS negative except what is  listed above and in the HPI.      Objective:    BP 125/85 (BP Location: Left Arm, Patient Position: Sitting, Cuff Size: Normal)   Pulse 97   Temp 98.7 F (37.1 C)   Ht 4' 11.1" (1.501 m)   Wt 162 lb 4 oz (73.6 kg)   LMP 03/29/2018 Comment: neg preg test 04/03/18  SpO2 100%   BMI 32.66 kg/m   Wt Readings from Last 3 Encounters:  04/08/18 162 lb 4 oz (73.6 kg)  04/03/18 160 lb (72.6 kg)  04/01/18 161 lb (73 kg)    Physical Exam  Constitutional: She is oriented to person, place, and time. She appears well-developed and well-nourished. No distress.  HENT:  Head: Normocephalic and atraumatic.  Right Ear: Hearing, tympanic membrane, external ear and ear canal normal.  Left Ear: Hearing, tympanic membrane, external ear and ear canal normal.  Nose: Nose normal.  Mouth/Throat: Uvula is midline, oropharynx is clear and moist and mucous membranes are normal. No oropharyngeal exudate.  Eyes: Pupils are equal, round, and reactive to light. Conjunctivae, EOM and lids are normal. Right eye exhibits no discharge. Left eye exhibits no discharge. No scleral icterus.  Neck: Normal range of motion. Neck supple. No JVD present. No tracheal deviation present. No thyromegaly present.  Cardiovascular: Normal rate, regular rhythm, normal heart sounds and intact distal pulses. Exam reveals no gallop and no friction rub.  No murmur heard. Pulmonary/Chest: Effort normal and breath sounds normal. No stridor. No respiratory distress. She has no wheezes. She has no rales. She exhibits no tenderness.  Abdominal: Soft. Bowel sounds are normal. She exhibits no distension and no mass. There is no tenderness. There is no rebound and no guarding. No hernia.  Genitourinary:  Genitourinary Comments: Pelvic and breast exams deferred- done at GYN  Musculoskeletal: Normal range of motion. She exhibits no edema, tenderness or deformity.  Lymphadenopathy:    She has no cervical adenopathy.  Neurological: She is alert and  oriented to person, place, and time. She displays normal reflexes. No cranial nerve deficit or sensory deficit. She exhibits normal muscle tone. Coordination normal.  Skin: Skin is warm, dry and intact. Capillary refill takes less than 2 seconds. No rash noted. She is not diaphoretic. No erythema. No pallor.  Psychiatric: She has a normal mood and affect. Her speech is normal and behavior is normal. Judgment and thought content normal. Cognition and memory are normal.  Nursing note and vitals reviewed.   Results for orders placed or performed during the hospital encounter of 53/66/44  Basic metabolic panel  Result Value Ref Range   Sodium 133 (L) 135 - 145 mmol/L   Potassium 4.1 3.5 - 5.1 mmol/L   Chloride 99 (L) 101 - 111 mmol/L   CO2 24 22 - 32 mmol/L   Glucose, Bld 101 (H) 65 - 99 mg/dL   BUN 11 6 - 20 mg/dL   Creatinine, Ser 0.52 0.44 - 1.00 mg/dL   Calcium 9.6 8.9 - 10.3 mg/dL   GFR calc non Af Amer >60 >60 mL/min   GFR calc Af Amer >60 >60 mL/min   Anion gap 10 5 - 15  CBC  Result Value Ref Range   WBC 7.7 3.6 - 11.0 K/uL   RBC 4.24 3.80 - 5.20 MIL/uL   Hemoglobin 13.0 12.0 - 16.0 g/dL   HCT 37.9 35.0 - 47.0 %   MCV 89.4 80.0 - 100.0 fL   MCH 30.6 26.0 - 34.0 pg   MCHC 34.2 32.0 - 36.0 g/dL   RDW 13.1 11.5 - 14.5 %   Platelets 200 150 - 440 K/uL  Troponin I  Result Value Ref Range   Troponin I <0.03 <0.03 ng/mL  Troponin I  Result Value Ref Range   Troponin I <0.03 <0.03 ng/mL  Pregnancy, urine POC  Result Value Ref Range   Preg Test, Ur NEGATIVE NEGATIVE      Assessment & Plan:   Problem List Items Addressed This Visit      Endocrine   IFG (impaired fasting glucose)    Microalbumin done today. Call with any concerns.       Relevant Orders   Microalbumin, Urine Waived     Other   Obesity    Would like to try saxenda. Has failed wellbutrin- cannot take contrave. Call with any concerns. Recheck 1 month.       Relevant Medications   Liraglutide -Weight  Management (SAXENDA) 18 MG/3ML SOPN    Other Visit Diagnoses    Routine general medical examination at a health care facility    -  Primary   Vaccines up to date. Screening labs checked today. Pap UTD. Mammogram ordered. Continue diet and exercise. Call with any concerns.    Screening for breast cancer       Mammogram ordered today.   Relevant Orders   MM DIGITAL SCREENING BILATERAL   Fatigue, unspecified type       Likely due to deconditioning and depression. Will consider going back on prozac. Call with any concerns. Offered referral for stress test, which she declined.        Follow up plan: Return in about 1 month (around 05/06/2018) for follow up saxenda.   LABORATORY TESTING:  - Pap smear: up to date  IMMUNIZATIONS:   - Tdap: Tetanus vaccination status reviewed: last tetanus booster within 10 years. - Influenza: Up to date - Pneumovax: Not applicable - Prevnar: Not applicable -  HPV: Refused - Zostavax vaccine: Not applicable  SCREENING: -Mammogram: Ordered today   PATIENT COUNSELING:   Advised to take 1 mg of folate supplement per day if capable of pregnancy.   Sexuality: Discussed sexually transmitted diseases, partner selection, use of condoms, avoidance of unintended pregnancy  and contraceptive alternatives.   Advised to avoid cigarette smoking.  I discussed with the patient that most people either abstain from alcohol or drink within safe limits (<=14/week and <=4 drinks/occasion for males, <=7/weeks and <= 3 drinks/occasion for females) and that the risk for alcohol disorders and other health effects rises proportionally with the number of drinks per week and how often a drinker exceeds daily limits.  Discussed cessation/primary prevention of drug use and availability of treatment for abuse.   Diet: Encouraged to adjust caloric intake to maintain  or achieve ideal body weight, to reduce intake of dietary saturated fat and total fat, to limit sodium intake by  avoiding high sodium foods and not adding table salt, and to maintain adequate dietary potassium and calcium preferably from fresh fruits, vegetables, and low-fat dairy products.    stressed the importance of regular exercise  Injury prevention: Discussed safety belts, safety helmets, smoke detector, smoking near bedding or upholstery.   Dental health: Discussed importance of regular tooth brushing, flossing, and dental visits.    NEXT PREVENTATIVE PHYSICAL DUE IN 1 YEAR. Return in about 1 month (around 05/06/2018) for follow up saxenda.

## 2018-04-08 NOTE — Patient Instructions (Addendum)
[facility]  Address: [address] [place], [place], [place] [address]  Phone: [phone] Health Maintenance, Female Adopting a healthy lifestyle and getting preventive care can go a long way to promote health and wellness. Talk with your health care provider about what schedule of regular examinations is right for you. This is a good chance for you to check in with your provider about disease prevention and staying healthy. In between checkups, there are plenty of things you can do on your own. Experts have done a lot of research about which lifestyle changes and preventive measures are most likely to keep you healthy. Ask your health care provider for more information. Weight and diet Eat a healthy diet  Be sure to include plenty of vegetables, fruits, low-fat dairy products, and lean protein.  Do not eat a lot of foods high in solid fats, added sugars, or salt.  Get regular exercise. This is one of the most important things you can do for your health. ? Most adults should exercise for at least 150 minutes each week. The exercise should increase your heart rate and make you sweat (moderate-intensity exercise). ? Most adults should also do strengthening exercises at least twice a week. This is in addition to the moderate-intensity exercise.  Maintain a healthy weight  Body mass index (BMI) is a measurement that can be used to identify possible weight problems. It estimates body fat based on height and weight. Your health care provider can help determine your BMI and help you achieve or maintain a healthy weight.  For females 25 years of age and older: ? A BMI below 18.5 is considered underweight. ? A BMI of 18.5 to 24.9 is normal. ? A BMI of 25 to 29.9 is considered overweight. ? A BMI of 30 and above is considered obese.  Watch levels of cholesterol and blood lipids  You should start having your blood tested for lipids and cholesterol at 41 years of  age, then have this test every 5 years.  You may need to have your cholesterol levels checked more often if: ? Your lipid or cholesterol levels are high. ? You are older than 41 years of age. ? You are at high risk for heart disease.  Cancer screening Lung Cancer  Lung cancer screening is recommended for adults 44-16 years old who are at high risk for lung cancer because of a history of smoking.  A yearly low-dose CT scan of the lungs is recommended for people who: ? Currently smoke. ? Have quit within the past 15 years. ? Have at least a 30-pack-year history of smoking. A pack year is smoking an average of one pack of cigarettes a day for 1 year.  Yearly screening should continue until it has been 15 years since you quit.  Yearly screening should stop if you develop a health problem that would prevent you from having lung cancer treatment.  Breast Cancer  Practice breast self-awareness. This means understanding how your breasts normally appear and feel.  It also means doing regular breast self-exams. Let your health care provider know about any changes, no matter how small.  If you are in your 20s or 30s, you should have a clinical breast exam (CBE) by a health care provider every 1-3 years as part of a regular health exam.  If you are 3 or older, have a CBE every year. Also consider having a breast X-ray (mammogram) every year.  If you have a family  history of breast cancer, talk to your health care provider about genetic screening.  If you are at high risk for breast cancer, talk to your health care provider about having an MRI and a mammogram every year.  Breast cancer gene (BRCA) assessment is recommended for women who have family members with BRCA-related cancers. BRCA-related cancers include: ? Breast. ? Ovarian. ? Tubal. ? Peritoneal cancers.  Results of the assessment will determine the need for genetic counseling and BRCA1 and BRCA2 testing.  Cervical  Cancer Your health care provider may recommend that you be screened regularly for cancer of the pelvic organs (ovaries, uterus, and vagina). This screening involves a pelvic examination, including checking for microscopic changes to the surface of your cervix (Pap test). You may be encouraged to have this screening done every 3 years, beginning at age 37.  For women ages 74-65, health care providers may recommend pelvic exams and Pap testing every 3 years, or they may recommend the Pap and pelvic exam, combined with testing for human papilloma virus (HPV), every 5 years. Some types of HPV increase your risk of cervical cancer. Testing for HPV may also be done on women of any age with unclear Pap test results.  Other health care providers may not recommend any screening for nonpregnant women who are considered low risk for pelvic cancer and who do not have symptoms. Ask your health care provider if a screening pelvic exam is right for you.  If you have had past treatment for cervical cancer or a condition that could lead to cancer, you need Pap tests and screening for cancer for at least 20 years after your treatment. If Pap tests have been discontinued, your risk factors (such as having a new sexual partner) need to be reassessed to determine if screening should resume. Some women have medical problems that increase the chance of getting cervical cancer. In these cases, your health care provider may recommend more frequent screening and Pap tests.  Colorectal Cancer  This type of cancer can be detected and often prevented.  Routine colorectal cancer screening usually begins at 41 years of age and continues through 41 years of age.  Your health care provider may recommend screening at an earlier age if you have risk factors for colon cancer.  Your health care provider may also recommend using home test kits to check for hidden blood in the stool.  A small camera at the end of a tube can be used to  examine your colon directly (sigmoidoscopy or colonoscopy). This is done to check for the earliest forms of colorectal cancer.  Routine screening usually begins at age 58.  Direct examination of the colon should be repeated every 5-10 years through 41 years of age. However, you may need to be screened more often if early forms of precancerous polyps or small growths are found.  Skin Cancer  Check your skin from head to toe regularly.  Tell your health care provider about any new moles or changes in moles, especially if there is a change in a mole's shape or color.  Also tell your health care provider if you have a mole that is larger than the size of a pencil eraser.  Always use sunscreen. Apply sunscreen liberally and repeatedly throughout the day.  Protect yourself by wearing long sleeves, pants, a wide-brimmed hat, and sunglasses whenever you are outside.  Heart disease, diabetes, and high blood pressure  High blood pressure causes heart disease and increases the risk of stroke.  High blood pressure is more likely to develop in: ? People who have blood pressure in the high end of the normal range (130-139/85-89 mm Hg). ? People who are overweight or obese. ? People who are African American.  If you are 9-75 years of age, have your blood pressure checked every 3-5 years. If you are 27 years of age or older, have your blood pressure checked every year. You should have your blood pressure measured twice-once when you are at a hospital or clinic, and once when you are not at a hospital or clinic. Record the average of the two measurements. To check your blood pressure when you are not at a hospital or clinic, you can use: ? An automated blood pressure machine at a pharmacy. ? A home blood pressure monitor.  If you are between 34 years and 27 years old, ask your health care provider if you should take aspirin to prevent strokes.  Have regular diabetes screenings. This involves taking a  blood sample to check your fasting blood sugar level. ? If you are at a normal weight and have a low risk for diabetes, have this test once every three years after 41 years of age. ? If you are overweight and have a high risk for diabetes, consider being tested at a younger age or more often. Preventing infection Hepatitis B  If you have a higher risk for hepatitis B, you should be screened for this virus. You are considered at high risk for hepatitis B if: ? You were born in a country where hepatitis B is common. Ask your health care provider which countries are considered high risk. ? Your parents were born in a high-risk country, and you have not been immunized against hepatitis B (hepatitis B vaccine). ? You have HIV or AIDS. ? You use needles to inject street drugs. ? You live with someone who has hepatitis B. ? You have had sex with someone who has hepatitis B. ? You get hemodialysis treatment. ? You take certain medicines for conditions, including cancer, organ transplantation, and autoimmune conditions.  Hepatitis C  Blood testing is recommended for: ? Everyone born from [birth year] through 1964. ? Anyone with known risk factors for hepatitis C.  Sexually transmitted infections (STIs)  You should be screened for sexually transmitted infections (STIs) including gonorrhea and chlamydia if: ? You are sexually active and are younger than 41 years of age. ? You are older than 41 years of age and your health care provider tells you that you are at risk for this type of infection. ? Your sexual activity has changed since you were last screened and you are at an increased risk for chlamydia or gonorrhea. Ask your health care provider if you are at risk.  If you do not have HIV, but are at risk, it may be recommended that you take a prescription medicine daily to prevent HIV infection. This is called pre-exposure prophylaxis (PrEP). You are considered at risk if: ? You are sexually active and  do not regularly use condoms or know the HIV status of your partner(s). ? You take drugs by injection. ? You are sexually active with a partner who has HIV.  Talk with your health care provider about whether you are at high risk of being infected with HIV. If you choose to begin PrEP, you should first be tested for HIV. You should then be tested every 3 months for as long as you are taking PrEP. Pregnancy  If you  are premenopausal and you may become pregnant, ask your health care provider about preconception counseling.  If you may become pregnant, take 400 to 800 micrograms (mcg) of folic acid every day.  If you want to prevent pregnancy, talk to your health care provider about birth control (contraception). Osteoporosis and menopause  Osteoporosis is a disease in which the bones lose minerals and strength with aging. This can result in serious bone fractures. Your risk for osteoporosis can be identified using a bone density scan.  If you are 46 years of age or older, or if you are at risk for osteoporosis and fractures, ask your health care provider if you should be screened.  Ask your health care provider whether you should take a calcium or vitamin D supplement to lower your risk for osteoporosis.  Menopause may have certain physical symptoms and risks.  Hormone replacement therapy may reduce some of these symptoms and risks. Talk to your health care provider about whether hormone replacement therapy is right for you. Follow these instructions at home:  Schedule regular health, dental, and eye exams.  Stay current with your immunizations.  Do not use any tobacco products including cigarettes, chewing tobacco, or electronic cigarettes.  If you are pregnant, do not drink alcohol.  If you are breastfeeding, limit how much and how often you drink alcohol.  Limit alcohol intake to no more than 1 drink per day for nonpregnant women. One drink equals 12 ounces of beer, 5 ounces of  wine, or 1 ounces of hard liquor.  Do not use street drugs.  Do not share needles.  Ask your health care provider for help if you need support or information about quitting drugs.  Tell your health care provider if you often feel depressed.  Tell your health care provider if you have ever been abused or do not feel safe at home. This information is not intended to replace advice given to you by your health care provider. Make sure you discuss any questions you have with your health care provider. Document Released: 05/22/2011 Document Revised: 04/13/2016 Document Reviewed: 08/10/2015 Elsevier Interactive Patient Education  [employer].

## 2018-04-09 ENCOUNTER — Encounter: Payer: Self-pay | Admitting: Family Medicine

## 2018-04-09 MED ORDER — FLUOXETINE HCL 20 MG PO CAPS: ORAL_CAPSULE | ORAL | 3 refills | Status: DC

## 2018-04-18 ENCOUNTER — Telehealth: Payer: Self-pay | Admitting: Family Medicine

## 2018-04-18 NOTE — Telephone Encounter (Signed)
Copied from [place] [id number]. Topic: Quick Communication - See Telephone Encounter >> Apr 18, 2018  2:27 PM Hewitt Shorts wrote: Ashley Wiley with cover my meds is needing to get prior authorization for medication saxenda -weight management  [phone]  ref number [id number]

## 2018-04-19 NOTE — Telephone Encounter (Signed)
Requesting form to be faxed, they have faxed it and we will fill it out as soon as we get it.

## 2018-04-23 NOTE — Telephone Encounter (Signed)
Cover my meds called and said they wanted to be sure the office received the form for the PA. They said they have not heard back. She is sending it again to be filled out. Call back @ [phone]

## 2018-05-06 ENCOUNTER — Encounter: Payer: Self-pay | Admitting: Family Medicine

## 2018-05-14 ENCOUNTER — Ambulatory Visit: Payer: No Typology Code available for payment source | Admitting: Family Medicine

## 2018-05-29 ENCOUNTER — Other Ambulatory Visit: Payer: Self-pay

## 2018-05-29 ENCOUNTER — Encounter: Payer: Self-pay | Admitting: Family Medicine

## 2018-05-29 ENCOUNTER — Ambulatory Visit (INDEPENDENT_AMBULATORY_CARE_PROVIDER_SITE_OTHER): Payer: No Typology Code available for payment source | Admitting: Family Medicine

## 2018-05-29 VITALS — BP 130/88 | HR 104 | Temp 98.3°F | Ht 59.0 in | Wt 152.6 lb

## 2018-05-29 DIAGNOSIS — F332 Major depressive disorder, recurrent severe without psychotic features: Secondary | ICD-10-CM

## 2018-05-29 DIAGNOSIS — E1165 Type 2 diabetes mellitus with hyperglycemia: Secondary | ICD-10-CM | POA: Insufficient documentation

## 2018-05-29 DIAGNOSIS — E6609 Other obesity due to excess calories: Secondary | ICD-10-CM | POA: Diagnosis not present

## 2018-05-29 DIAGNOSIS — E119 Type 2 diabetes mellitus without complications: Secondary | ICD-10-CM | POA: Insufficient documentation

## 2018-05-29 DIAGNOSIS — Z6832 Body mass index (BMI) 32.0-32.9, adult: Secondary | ICD-10-CM

## 2018-05-29 MED ORDER — LIRAGLUTIDE -WEIGHT MANAGEMENT 18 MG/3ML ~~LOC~~ SOPN: 3.0000 mg | PEN_INJECTOR | Freq: Every day | SUBCUTANEOUS | 6 refills | Status: DC

## 2018-05-29 MED ORDER — VENLAFAXINE HCL ER 150 MG PO CP24: 150.0000 mg | ORAL_CAPSULE | Freq: Every day | ORAL | 3 refills | Status: DC

## 2018-05-29 MED ORDER — BUSPIRONE HCL 15 MG PO TABS: 15.0000 mg | ORAL_TABLET | Freq: Three times a day (TID) | ORAL | 3 refills | Status: DC

## 2018-05-29 NOTE — Assessment & Plan Note (Signed)
Down 12 lbs with saxenda! Continue saxenda and diet and exercise. Call with any concerns.

## 2018-05-29 NOTE — Progress Notes (Signed)
BP 130/88   Pulse (!) 104   Temp 98.3 F (36.8 C) (Oral)   Ht 4\' 11"  (1.499 m)   Wt 152 lb 9 oz (69.2 kg)   SpO2 100%   BMI 30.81 kg/m    Subjective:    Patient ID: Ashley Wiley, female    DOB: Jan 12, 1976, 42 y.o.   MRN: [id number]  HPI: Ashley Wiley is a 42 y.o. female  Chief Complaint  Patient presents with  . Obesity    med saxenda   OBESITY Duration: Chronic Previous attempts at weight loss: yes Complications of obesity: IFG, HTN Peak weight: 162 Weight loss goal: to be healthy Weight loss to date: 12 lbs Requesting obesity pharmacotherapy: yes Current weight loss supplements/medications: yes- saxenda Previous weight loss supplements/meds: yes- wellbutrin  DEPRESSION- increased her prozac to 60mg , Cut back to her topamax to 1 BID and that helps her mood Mood status: better Satisfied with current treatment?: no Symptom severity: moderate  Duration of current treatment : chronic Side effects: no Medication compliance: excellent compliance Psychotherapy/counseling: no  Previous psychiatric medications: prozac, buspar Depressed mood: yes Anxious mood: yes Anhedonia: no Significant weight loss or gain: yes Insomnia: no  Fatigue: yes Feelings of worthlessness or guilt: yes Impaired concentration/indecisiveness: yes Suicidal ideations: no Hopelessness: yes Crying spells: yes Depression screen [facility] 2/9 05/29/2018 04/08/2018 12/18/2017 07/17/2017 06/05/2017  Decreased Interest 3 0 1 1 1   Down, Depressed, Hopeless 2 0 1 0 1  PHQ - 2 Score 5 0 2 1 2   Altered sleeping 3 3 2  0 2  Tired, decreased energy 3 3 2 1 2   Change in appetite 0 3 2 1 2   Feeling bad or failure about yourself  1 3 1  0 2  Trouble concentrating 1 0 2 0 2  Moving slowly or fidgety/restless 2 3 2  0 2  Suicidal thoughts 0 0 0 0 0  PHQ-9 Score 15 15 13 3 14   Difficult doing work/chores Very difficult Somewhat difficult - - -   GAD 7 : Generalized Anxiety Score 05/29/2018 12/18/2017  06/05/2017 05/11/2017  Nervous, Anxious, on Edge 3 3 2 3   Control/stop worrying 2 2 2 3   Worry too much - different things 2 2 2 3   Trouble relaxing 2 2 2 3   Restless 1 2 2 3   Easily annoyed or irritable 2 2 2 3   Afraid - awful might happen 0 2 1 3   Total GAD 7 Score 12 15 13 21   Anxiety Difficulty Somewhat difficult Somewhat difficult - Somewhat difficult      Relevant past medical, surgical, family and social history reviewed and updated as indicated. Interim medical history since our last visit reviewed. Allergies and medications reviewed and updated.  Review of Systems  Constitutional: Positive for fatigue. Negative for activity change, appetite change, chills, diaphoresis, fever and unexpected weight change.  Respiratory: Negative.   Cardiovascular: Negative.   Skin: Negative.   Neurological: Negative.   Psychiatric/Behavioral: Positive for decreased concentration, dysphoric mood and sleep disturbance. Negative for agitation, behavioral problems, confusion, hallucinations, self-injury and suicidal ideas. The patient is nervous/anxious. The patient is not hyperactive.     Per HPI unless specifically indicated above     Objective:    BP 130/88   Pulse (!) 104   Temp 98.3 F (36.8 C) (Oral)   Ht 4\' 11"  (1.499 m)   Wt 152 lb 9 oz (69.2 kg)   SpO2 100%   BMI 30.81 kg/m   Wt Readings  from Last 3 Encounters:  05/29/18 152 lb 9 oz (69.2 kg)  04/08/18 162 lb 4 oz (73.6 kg)  04/03/18 160 lb (72.6 kg)    Physical Exam  Constitutional: She is oriented to person, place, and time. She appears well-developed and well-nourished. No distress.  HENT:  Head: Normocephalic and atraumatic.  Right Ear: Hearing normal.  Left Ear: Hearing normal.  Nose: Nose normal.  Eyes: Conjunctivae and lids are normal. Right eye exhibits no discharge. Left eye exhibits no discharge. No scleral icterus.  Cardiovascular: Normal rate, regular rhythm, normal heart sounds and intact distal pulses. Exam  reveals no gallop and no friction rub.  No murmur heard. Pulmonary/Chest: Effort normal and breath sounds normal. No stridor. No respiratory distress. She has no wheezes. She has no rales. She exhibits no tenderness.  Musculoskeletal: Normal range of motion.  Neurological: She is alert and oriented to person, place, and time.  Skin: Skin is warm, dry and intact. Capillary refill takes less than 2 seconds. No rash noted. She is not diaphoretic. No erythema. No pallor.  Psychiatric: She has a normal mood and affect. Her speech is normal and behavior is normal. Judgment and thought content normal. Cognition and memory are normal.  Nursing note and vitals reviewed.   Results for orders placed or performed in visit on 04/26/18  HM DIABETES EYE EXAM  Result Value Ref Range   HM Diabetic Eye Exam No Retinopathy No Retinopathy      Assessment & Plan:   Problem List Items Addressed This Visit      Other   Depression    Not under good control. Will change from prozac to effexor and recheck in 1 month. Buspar PRN. Will consider short period of FMLA- call with any concerns.       Relevant Medications   venlafaxine XR (EFFEXOR-XR) 150 MG 24 hr capsule   busPIRone (BUSPAR) 15 MG tablet   Obesity - Primary    Down 12 lbs with saxenda! Continue saxenda and diet and exercise. Call with any concerns.       Relevant Medications   Liraglutide -Weight Management (SAXENDA) 18 MG/3ML SOPN       Follow up plan: Return in about 1 month (around 06/26/2018).

## 2018-05-29 NOTE — Assessment & Plan Note (Signed)
Not under good control. Will change from prozac to effexor and recheck in 1 month. Buspar PRN. Will consider short period of FMLA- call with any concerns.

## 2018-05-31 ENCOUNTER — Encounter: Payer: Self-pay | Admitting: Family Medicine

## 2018-06-24 ENCOUNTER — Encounter: Payer: Self-pay | Admitting: Family Medicine

## 2018-06-28 ENCOUNTER — Ambulatory Visit: Payer: Self-pay | Admitting: Family Medicine

## 2018-07-02 ENCOUNTER — Ambulatory Visit (INDEPENDENT_AMBULATORY_CARE_PROVIDER_SITE_OTHER): Payer: No Typology Code available for payment source | Admitting: Family Medicine

## 2018-07-02 ENCOUNTER — Encounter: Payer: Self-pay | Admitting: Family Medicine

## 2018-07-02 VITALS — BP 124/87 | HR 106 | Temp 98.3°F | Wt 151.5 lb

## 2018-07-02 DIAGNOSIS — F332 Major depressive disorder, recurrent severe without psychotic features: Secondary | ICD-10-CM | POA: Diagnosis not present

## 2018-07-02 MED ORDER — VENLAFAXINE HCL ER 75 MG PO CP24: 75.0000 mg | ORAL_CAPSULE | Freq: Every day | ORAL | 3 refills | Status: DC

## 2018-07-02 NOTE — Assessment & Plan Note (Signed)
Not doing well. Will increase effexor to 225mg  and recheck 2 weeks, if no better, will need to add abilify. Call with any concerns.

## 2018-07-02 NOTE — Progress Notes (Signed)
BP 124/87 (BP Location: Left Arm, Patient Position: Sitting, Cuff Size: Normal)   Pulse (!) 106   Temp 98.3 F (36.8 C)   Wt 151 lb 8 oz (68.7 kg)   SpO2 98%   BMI 30.60 kg/m    Subjective:    Patient ID: Ashley Wiley, female    DOB: [birth year], 42 y.o.   MRN: [id number]  HPI: Ashley Wiley is a 42 y.o. female  Chief Complaint  Patient presents with  . Depression  . 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 She notes that she wasn't feeding herself. She was not doing well at all. She states that her husband needed to take some time off to get her up and make sure that she was showering. She notes that her husband needed to take time off from 7/23 to 8/1.  Mood status: stable Satisfied with current treatment?: no Symptom severity: severe  Duration of current treatment : chronic Side effects: no Medication compliance: excellent compliance Psychotherapy/counseling: no  Depressed mood: yes Anxious mood: yes Anhedonia: no Significant weight loss or gain: yes Insomnia: yes hard to fall asleep Fatigue: yes Feelings of worthlessness or guilt: yes Impaired concentration/indecisiveness: yes Suicidal ideations: yes Hopelessness: yes Crying spells: yes Depression screen [facility] 2/9 07/02/2018 05/29/2018 04/08/2018 12/18/2017 07/17/2017  Decreased Interest 3 3 0 1 1  Down, Depressed, Hopeless 3 2 0 1 0  PHQ - 2 Score 6 5 0 2 1  Altered sleeping 3 3 3 2  0  Tired, decreased energy 3 3 3 2 1   Change in appetite 3 0 3 2 1   Feeling bad or failure about yourself  2 1 3 1  0  Trouble concentrating 2 1 0 2 0  Moving slowly or fidgety/restless 3 2 3 2  0  Suicidal thoughts 1 0 0 0 0  PHQ-9 Score 23 15 15 13 3   Difficult doing work/chores Extremely dIfficult Very difficult Somewhat difficult - -  Some recent data might be hidden   GAD 7 : Generalized Anxiety Score  07/02/2018 05/29/2018 12/18/2017 06/05/2017  Nervous, Anxious, on Edge 3 3 3 2   Control/stop worrying 3 2 2 2   Worry too much - different things 3 2 2 2   Trouble relaxing 3 2 2 2   Restless 1 1 2 2   Easily annoyed or irritable 2 2 2 2   Afraid - awful might happen 3 0 2 1  Total GAD 7 Score 18 12 15 13   Anxiety Difficulty Very difficult Somewhat difficult Somewhat difficult -    Relevant past medical, surgical, family and social history reviewed and updated as indicated. Interim medical history since our last visit reviewed. Allergies and medications reviewed and updated.  Review of Systems  Constitutional: Negative.   Respiratory: Negative.   Cardiovascular: Negative.   Psychiatric/Behavioral: Positive for behavioral problems, confusion, decreased concentration, dysphoric mood and sleep disturbance. Negative for agitation, hallucinations, self-injury and suicidal ideas. The patient is nervous/anxious. The patient is not hyperactive.     Per HPI unless specifically indicated above     Objective:    BP 124/87 (BP Location: Left Arm, Patient Position: Sitting, Cuff Size: Normal)   Pulse (!) 106   Temp 98.3 F (36.8 C)   Wt 151 lb 8 oz (68.7 kg)   SpO2 98%   BMI 30.60 kg/m   Wt Readings from  Last 3 Encounters:  07/02/18 151 lb 8 oz (68.7 kg)  05/29/18 152 lb 9 oz (69.2 kg)  04/08/18 162 lb 4 oz (73.6 kg)    Physical Exam  Constitutional: She is oriented to person, place, and time. She appears well-developed and well-nourished. No distress.  HENT:  Head: Normocephalic and atraumatic.  Right Ear: Hearing normal.  Left Ear: Hearing normal.  Nose: Nose normal.  Eyes: Conjunctivae and lids are normal. Right eye exhibits no discharge. Left eye exhibits no discharge. No scleral icterus.  Cardiovascular: Normal rate, regular rhythm, normal heart sounds and intact distal pulses. Exam reveals no gallop and no friction rub.  No murmur heard. Pulmonary/Chest: Effort normal and breath  sounds normal. No stridor. No respiratory distress. She has no wheezes. She has no rales. She exhibits no tenderness.  Musculoskeletal: Normal range of motion.  Neurological: She is alert and oriented to person, place, and time.  Skin: Skin is warm, dry and intact. Capillary refill takes less than 2 seconds. No rash noted. She is not diaphoretic. No erythema. No pallor.  Psychiatric: She has a normal mood and affect. Her speech is normal and behavior is normal. Judgment and thought content normal. Cognition and memory are normal.  Nursing note and vitals reviewed.   Results for orders placed or performed in visit on 04/26/18  HM DIABETES EYE EXAM  Result Value Ref Range   HM Diabetic Eye Exam No Retinopathy No Retinopathy      Assessment & Plan:   Problem List Items Addressed This Visit      Other   Depression - Primary    Not doing well. Will increase effexor to 225mg  and recheck 2 weeks, if no better, will need to add abilify. Call with any concerns.       Relevant Medications   venlafaxine XR (EFFEXOR XR) 75 MG 24 hr capsule       Follow up plan: Return in about 2 weeks (around 07/16/2018) for follow up mood.

## 2018-07-08 ENCOUNTER — Encounter: Payer: Self-pay | Admitting: Family Medicine

## 2018-07-08 ENCOUNTER — Other Ambulatory Visit: Payer: Self-pay | Admitting: Family Medicine

## 2018-07-16 ENCOUNTER — Ambulatory Visit (INDEPENDENT_AMBULATORY_CARE_PROVIDER_SITE_OTHER): Payer: No Typology Code available for payment source | Admitting: Family Medicine

## 2018-07-16 ENCOUNTER — Encounter: Payer: Self-pay | Admitting: Family Medicine

## 2018-07-16 VITALS — BP 128/86 | HR 94 | Temp 98.4°F | Wt 151.3 lb

## 2018-07-16 DIAGNOSIS — F332 Major depressive disorder, recurrent severe without psychotic features: Secondary | ICD-10-CM | POA: Diagnosis not present

## 2018-07-16 MED ORDER — ARIPIPRAZOLE 2 MG PO TABS: ORAL_TABLET | ORAL | 1 refills | Status: DC

## 2018-07-16 MED ORDER — PROPRANOLOL HCL ER 80 MG PO CP24: 80.0000 mg | ORAL_CAPSULE | Freq: Every day | ORAL | 3 refills | Status: DC

## 2018-07-16 NOTE — Progress Notes (Signed)
BP 128/86 (BP Location: Left Arm, Patient Position: Sitting, Cuff Size: Normal)   Pulse 94   Temp 98.4 F (36.9 C)   Wt 151 lb 5 oz (68.6 kg)   SpO2 100%   BMI 30.56 kg/m    Subjective:    Patient ID: Ashley Wiley, female    DOB: 04/19/1976, 42 y.o.   MRN: [id number]  HPI: Ashley Wiley is a 42 y.o. female  Chief Complaint  Patient presents with  . Depression   Stopped her topamax on her own because it was making her tired and not helping with her headaches. Her headaches are still severe over the R side of her head, behind her eye. Has been going on for a couple of days. Still taking her nortriptyline.  DEPRESSION Mood status: very very slightly better Satisfied with current treatment?: no Symptom severity: severe  Duration of current treatment : months Side effects: no Medication compliance: excellent compliance Psychotherapy/counseling: no- starting next week  Depressed mood: yes Anxious mood: yes Anhedonia: yes Significant weight loss or gain: no Insomnia: no  Fatigue: yes Feelings of worthlessness or guilt: yes Impaired concentration/indecisiveness: yes Suicidal ideations: no Hopelessness: yes Crying spells: yes Depression screen [facility] 2/9 07/16/2018 07/02/2018 05/29/2018 04/08/2018 12/18/2017  Decreased Interest 2 3 3  0 1  Down, Depressed, Hopeless 2 3 2  0 1  PHQ - 2 Score 4 6 5  0 2  Altered sleeping 2 3 3 3 2   Tired, decreased energy 2 3 3 3 2   Change in appetite 1 3 0 3 2  Feeling bad or failure about yourself  3 2 1 3 1   Trouble concentrating 3 2 1  0 2  Moving slowly or fidgety/restless 3 3 2 3 2   Suicidal thoughts 0 1 0 0 0  PHQ-9 Score 18 23 15 15 13   Difficult doing work/chores Very difficult Extremely dIfficult Very difficult Somewhat difficult -  Some recent data might be hidden   GAD 7 : Generalized Anxiety Score 07/16/2018 07/02/2018 05/29/2018 12/18/2017  Nervous, Anxious, on Edge 1 3 3 3   Control/stop worrying 3 3 2 2   Worry too much -  different things 3 3 2 2   Trouble relaxing 3 3 2 2   Restless 1 1 1 2   Easily annoyed or irritable 1 2 2 2   Afraid - awful might happen 0 3 0 2  Total GAD 7 Score 12 18 12 15   Anxiety Difficulty Very difficult Very difficult Somewhat difficult Somewhat difficult    Relevant past medical, surgical, family and social history reviewed and updated as indicated. Interim medical history since our last visit reviewed. Allergies and medications reviewed and updated.  Review of Systems  Constitutional: Negative.   Respiratory: Negative.   Cardiovascular: Negative.   Skin: Negative.   Psychiatric/Behavioral: Positive for dysphoric mood. Negative for agitation, behavioral problems, confusion, decreased concentration, hallucinations, self-injury, sleep disturbance and suicidal ideas. The patient is nervous/anxious. The patient is not hyperactive.     Per HPI unless specifically indicated above     Objective:    BP 128/86 (BP Location: Left Arm, Patient Position: Sitting, Cuff Size: Normal)   Pulse 94   Temp 98.4 F (36.9 C)   Wt 151 lb 5 oz (68.6 kg)   SpO2 100%   BMI 30.56 kg/m   Wt Readings from Last 3 Encounters:  07/16/18 151 lb 5 oz (68.6 kg)  07/02/18 151 lb 8 oz (68.7 kg)  05/29/18 152 lb 9 oz (69.2 kg)  Physical Exam  Constitutional: She is oriented to person, place, and time. She appears well-developed and well-nourished. No distress.  HENT:  Head: Normocephalic and atraumatic.  Right Ear: Hearing normal.  Left Ear: Hearing normal.  Nose: Nose normal.  Eyes: Conjunctivae and lids are normal. Right eye exhibits no discharge. Left eye exhibits no discharge. No scleral icterus.  Cardiovascular: Normal rate, regular rhythm, normal heart sounds and intact distal pulses. Exam reveals no gallop and no friction rub.  No murmur heard. Pulmonary/Chest: Effort normal and breath sounds normal. No stridor. No respiratory distress. She has no wheezes. She has no rales. She exhibits no  tenderness.  Musculoskeletal: Normal range of motion.  Neurological: She is alert and oriented to person, place, and time.  Skin: Skin is warm, dry and intact. Capillary refill takes less than 2 seconds. No rash noted. She is not diaphoretic. No erythema. No pallor.  Psychiatric: She has a normal mood and affect. Her speech is normal and behavior is normal. Judgment and thought content normal. Cognition and memory are normal.  Nursing note and vitals reviewed.   Results for orders placed or performed in visit on 04/26/18  HM DIABETES EYE EXAM  Result Value Ref Range   HM Diabetic Eye Exam No Retinopathy No Retinopathy      Assessment & Plan:   Problem List Items Addressed This Visit      Other   Depression - Primary    Not under good control on current regimen. Will continue venlafaxine and start 2mg  abilify for 2 weeks, then increase to 4mg  after 2 weeks. Call with any concerns. Continue to monitor closely.          Follow up plan: Return in about 2 weeks (around 07/30/2018) for follow up mood.

## 2018-07-16 NOTE — Assessment & Plan Note (Signed)
Not under good control on current regimen. Will continue venlafaxine and start 2mg  abilify for 2 weeks, then increase to 4mg  after 2 weeks. Call with any concerns. Continue to monitor closely.

## 2018-07-18 ENCOUNTER — Ambulatory Visit: Payer: No Typology Code available for payment source | Admitting: Family Medicine

## 2018-07-24 ENCOUNTER — Ambulatory Visit: Payer: No Typology Code available for payment source | Admitting: Family Medicine

## 2018-07-25 ENCOUNTER — Encounter: Payer: Self-pay | Admitting: Family Medicine

## 2018-07-25 ENCOUNTER — Other Ambulatory Visit: Payer: Self-pay

## 2018-07-25 ENCOUNTER — Ambulatory Visit (INDEPENDENT_AMBULATORY_CARE_PROVIDER_SITE_OTHER): Payer: No Typology Code available for payment source | Admitting: Family Medicine

## 2018-07-25 VITALS — BP 125/85 | HR 80 | Temp 98.6°F | Ht 59.0 in | Wt 153.3 lb

## 2018-07-25 DIAGNOSIS — Z23 Encounter for immunization: Secondary | ICD-10-CM | POA: Diagnosis not present

## 2018-07-25 DIAGNOSIS — M545 Low back pain, unspecified: Secondary | ICD-10-CM

## 2018-07-25 MED ORDER — CYCLOBENZAPRINE HCL 10 MG PO TABS: 10.0000 mg | ORAL_TABLET | Freq: Every day | ORAL | 0 refills | Status: DC

## 2018-07-25 MED ORDER — NAPROXEN 500 MG PO TABS: 500.0000 mg | ORAL_TABLET | Freq: Two times a day (BID) | ORAL | 1 refills | Status: DC

## 2018-07-25 NOTE — Patient Instructions (Signed)
Cervical Strain and Sprain Rehab Ask your health care provider which exercises are safe for you. Do exercises exactly as told by your health care provider and adjust them as directed. It is normal to feel mild stretching, pulling, tightness, or discomfort as you do these exercises, but you should stop right away if you feel sudden pain or your pain gets worse.Do not begin these exercises until told by your health care provider. Stretching and range of motion exercises These exercises warm up your muscles and joints and improve the movement and flexibility of your neck. These exercises also help to relieve pain, numbness, and tingling. Exercise A: Cervical side bend  1. Using good posture, sit on a stable chair or stand up. 2. Without moving your shoulders, slowly tilt your left / right ear to your shoulder until you feel a stretch in your neck muscles. You should be looking straight ahead. 3. Hold for __________ seconds. 4. Repeat with the other side of your neck. Repeat __________ times. Complete this exercise __________ times a day. Exercise B: Cervical rotation  1. Using good posture, sit on a stable chair or stand up. 2. Slowly turn your head to the side as if you are looking over your left / right shoulder. ? Keep your eyes level with the ground. ? Stop when you feel a stretch along the side and the back of your neck. 3. Hold for __________ seconds. 4. Repeat this by turning to your other side. Repeat __________ times. Complete this exercise __________ times a day. Exercise C: Thoracic extension and pectoral stretch 1. Roll a towel or a small blanket so it is about 4 inches (10 cm) in diameter. 2. Lie down on your back on a firm surface. 3. Put the towel lengthwise, under your spine in the middle of your back. It should not be not under your shoulder blades. The towel should line up with your spine from your middle back to your lower back. 4. Put your hands behind your head and let your  elbows fall out to your sides. 5. Hold for __________ seconds. Repeat __________ times. Complete this exercise __________ times a day. Strengthening exercises These exercises build strength and endurance in your neck. Endurance is the ability to use your muscles for a long time, even after your muscles get tired. Exercise D: Upper cervical flexion, isometric 1. Lie on your back with a thin pillow behind your head and a small rolled-up towel under your neck. 2. Gently tuck your chin toward your chest and nod your head down to look toward your feet. Do not lift your head off the pillow. 3. Hold for __________ seconds. 4. Release the tension slowly. Relax your neck muscles completely before you repeat this exercise. Repeat __________ times. Complete this exercise __________ times a day. Exercise E: Cervical extension, isometric  1. Stand about 6 inches (15 cm) away from a wall, with your back facing the wall. 2. Place a soft object, about 6-8 inches (15-20 cm) in diameter, between the back of your head and the wall. A soft object could be a small pillow, a ball, or a folded towel. 3. Gently tilt your head back and press into the soft object. Keep your jaw and forehead relaxed. 4. Hold for __________ seconds. 5. Release the tension slowly. Relax your neck muscles completely before you repeat this exercise. Repeat __________ times. Complete this exercise __________ times a day. Posture and body mechanics  Body mechanics refers to the movements and positions of   your body while you do your daily activities. Posture is part of body mechanics. Good posture and healthy body mechanics can help to relieve stress in your body's tissues and joints. Good posture means that your spine is in its natural S-curve position (your spine is neutral), your shoulders are pulled back slightly, and your head is not tipped forward. The following are general guidelines for applying improved posture and body mechanics to  your everyday activities. Standing  When standing, keep your spine neutral and keep your feet about hip-width apart. Keep a slight bend in your knees. Your ears, shoulders, and hips should line up.  When you do a task in which you stand in one place for a long time, place one foot up on a stable object that is 2-4 inches (5-10 cm) high, such as a footstool. This helps keep your spine neutral. Sitting   When sitting, keep your spine neutral and your keep feet flat on the floor. Use a footrest, if necessary, and keep your thighs parallel to the floor. Avoid rounding your shoulders, and avoid tilting your head forward.  When working at a desk or a computer, keep your desk at a height where your hands are slightly lower than your elbows. Slide your chair under your desk so you are close enough to maintain good posture.  When working at a computer, place your monitor at a height where you are looking straight ahead and you do not have to tilt your head forward or downward to look at the screen. Resting When lying down and resting, avoid positions that are most painful for you. Try to support your neck in a neutral position. You can use a contour pillow or a small rolled-up towel. Your pillow should support your neck but not push on it. This information is not intended to replace advice given to you by your health care provider. Make sure you discuss any questions you have with your health care provider. Document Released: 11/06/2005 Document Revised: 07/13/2016 Document Reviewed: 10/13/2015 Elsevier Interactive Patient Education  2018 [employer].  Back Exercises The following exercises strengthen the muscles that help to support the back. They also help to keep the lower back flexible. Doing these exercises can help to prevent back pain or lessen existing pain. If you have back pain or discomfort, try doing these exercises 2-3 times each day or as told by your health care provider. When the pain  goes away, do them once each day, but increase the number of times that you repeat the steps for each exercise (do more repetitions). If you do not have back pain or discomfort, do these exercises once each day or as told by your health care provider. Exercises Single Knee to Chest  Repeat these steps 3-5 times for each leg: 1. Lie on your back on a firm bed or the floor with your legs extended. 2. Bring one knee to your chest. Your other leg should stay extended and in contact with the floor. 3. Hold your knee in place by grabbing your knee or thigh. 4. Pull on your knee until you feel a gentle stretch in your lower back. 5. Hold the stretch for 10-30 seconds. 6. Slowly release and straighten your leg.  Pelvic Tilt  Repeat these steps 5-10 times: 1. Lie on your back on a firm bed or the floor with your legs extended. 2. Bend your knees so they are pointing toward the ceiling and your feet are flat on the floor. 3.  Tighten your lower abdominal muscles to press your lower back against the floor. This motion will tilt your pelvis so your tailbone points up toward the ceiling instead of pointing to your feet or the floor. 4. With gentle tension and even breathing, hold this position for 5-10 seconds.  Cat-Cow  Repeat these steps until your lower back becomes more flexible: 1. Get into a hands-and-knees position on a firm surface. Keep your hands under your shoulders, and keep your knees under your hips. You may place padding under your knees for comfort. 2. Let your head hang down, and point your tailbone toward the floor so your lower back becomes rounded like the back of a cat. 3. Hold this position for 5 seconds. 4. Slowly lift your head and point your tailbone up toward the ceiling so your back forms a sagging arch like the back of a cow. 5. Hold this position for 5 seconds.  Press-Ups  Repeat these steps 5-10 times: 1. Lie on your abdomen (face-down) on the floor. 2. Place your  palms near your head, about shoulder-width apart. 3. While you keep your back as relaxed as possible and keep your hips on the floor, slowly straighten your arms to raise the top half of your body and lift your shoulders. Do not use your back muscles to raise your upper torso. You may adjust the placement of your hands to make yourself more comfortable. 4. Hold this position for 5 seconds while you keep your back relaxed. 5. Slowly return to lying flat on the floor.  Bridges  Repeat these steps 10 times: 1. Lie on your back on a firm surface. 2. Bend your knees so they are pointing toward the ceiling and your feet are flat on the floor. 3. Tighten your buttocks muscles and lift your buttocks off of the floor until your waist is at almost the same height as your knees. You should feel the muscles working in your buttocks and the back of your thighs. If you do not feel these muscles, slide your feet 1-2 inches farther away from your buttocks. 4. Hold this position for 3-5 seconds. 5. Slowly lower your hips to the starting position, and allow your buttocks muscles to relax completely.  If this exercise is too easy, try doing it with your arms crossed over your chest. Abdominal Crunches  Repeat these steps 5-10 times: 1. Lie on your back on a firm bed or the floor with your legs extended. 2. Bend your knees so they are pointing toward the ceiling and your feet are flat on the floor. 3. Cross your arms over your chest. 4. Tip your chin slightly toward your chest without bending your neck. 5. Tighten your abdominal muscles and slowly raise your trunk (torso) high enough to lift your shoulder blades a tiny bit off of the floor. Avoid raising your torso higher than that, because it can put too much stress on your low back and it does not help to strengthen your abdominal muscles. 6. Slowly return to your starting position.  Back Lifts Repeat these steps 5-10 times: 1. Lie on your abdomen  (face-down) with your arms at your sides, and rest your forehead on the floor. 2. Tighten the muscles in your legs and your buttocks. 3. Slowly lift your chest off of the floor while you keep your hips pressed to the floor. Keep the back of your head in line with the curve in your back. Your eyes should be looking at the floor.  4. Hold this position for 3-5 seconds. 5. Slowly return to your starting position.  Contact a health care provider if:  Your back pain or discomfort gets much worse when you do an exercise.  Your back pain or discomfort does not lessen within 2 hours after you exercise. If you have any of these problems, stop doing these exercises right away. Do not do them again unless your health care provider says that you can. Get help right away if:  You develop sudden, severe back pain. If this happens, stop doing the exercises right away. Do not do them again unless your health care provider says that you can. This information is not intended to replace advice given to you by your health care provider. Make sure you discuss any questions you have with your health care provider. Document Released: 12/14/2004 Document Revised: 03/15/2016 Document Reviewed: 12/31/2014 Elsevier Interactive Patient Education  2017 [employer].

## 2018-07-25 NOTE — Progress Notes (Signed)
BP 125/85   Pulse 80   Temp 98.6 F (37 C) (Oral)   Ht 4\' 11"  (1.499 m)   Wt 153 lb 5 oz (69.5 kg)   SpO2 100%   BMI 30.97 kg/m    Subjective:    Patient ID: Ashley Wiley, female    DOB: 12/31/1975, 41 y.o.   MRN: [id number]  HPI: Ashley Wiley is a 42 y.o. female  Chief Complaint  Patient presents with  . Back Pain    x 1 week upper and lower/ pt states has been taken tylenol, ibuprofen, and hydrocodone left over prescription while back, not helping  . Shoulder Pain    bilateral   BACK PAIN Duration: 2-3 weeks Mechanism of injury: unknown Location: bilateral, low back and upper back Onset: sudden Severity: 3-4/10 severe Quality: constant dull ache Frequency: constant Radiation: both legs down to her knees Aggravating factors: none, movement and prolonged sitting Alleviating factors: rest, heat and APAP Status: worse Treatments attempted: rest, heat and APAP  Relief with NSAIDs?: mild Nighttime pain:  no Paresthesias / decreased sensation:  no Bowel / bladder incontinence:  no Fevers:  no Dysuria / urinary frequency:  no  Relevant past medical, surgical, family and social history reviewed and updated as indicated. Interim medical history since our last visit reviewed. Allergies and medications reviewed and updated.  Review of Systems  Respiratory: Negative.   Cardiovascular: Negative.   Gastrointestinal: Negative.   Musculoskeletal: Positive for back pain and myalgias. Negative for arthralgias, gait problem, joint swelling, neck pain and neck stiffness.  Skin: Negative.   Neurological: Negative.   Psychiatric/Behavioral: Negative.     Per HPI unless specifically indicated above     Objective:    BP 125/85   Pulse 80   Temp 98.6 F (37 C) (Oral)   Ht 4\' 11"  (1.499 m)   Wt 153 lb 5 oz (69.5 kg)   SpO2 100%   BMI 30.97 kg/m   Wt Readings from Last 3 Encounters:  07/25/18 153 lb 5 oz (69.5 kg)  07/16/18 151 lb 5 oz (68.6 kg)  07/02/18  151 lb 8 oz (68.7 kg)    Physical Exam  Constitutional: She is oriented to person, place, and time. She appears well-developed and well-nourished. No distress.  HENT:  Head: Normocephalic and atraumatic.  Right Ear: Hearing normal.  Left Ear: Hearing normal.  Nose: Nose normal.  Eyes: Conjunctivae and lids are normal. Right eye exhibits no discharge. Left eye exhibits no discharge. No scleral icterus.  Cardiovascular: Normal rate, regular rhythm, normal heart sounds and intact distal pulses. Exam reveals no gallop and no friction rub.  No murmur heard. Pulmonary/Chest: Effort normal and breath sounds normal. No stridor. No respiratory distress. She has no wheezes. She has no rales. She exhibits no tenderness.  Neurological: She is alert and oriented to person, place, and time.  Skin: Skin is warm, dry and intact. Capillary refill takes less than 2 seconds. No rash noted. She is not diaphoretic. No erythema. No pallor.  Psychiatric: She has a normal mood and affect. Her speech is normal and behavior is normal. Judgment and thought content normal. Cognition and memory are normal.  Nursing note and vitals reviewed. Back Exam:    Inspection:  Normal spinal curvature.  No deformity, ecchymosis, erythema, or lesions     Palpation:     Midline spinal tenderness: no      Paralumbar tenderness: yes bilateral     Parathoracic tenderness: yes bilateral  Buttocks tenderness: no     Range of Motion:      Flexion: Fingers to Knees     Extension:Decreased     Lateral bending:Decreased    Rotation:Decreased    Neuro Exam:Lower extremity DTRs normal & symmetric.  Strength and sensation intact.    Special Tests:      Straight leg raise:negative   Results for orders placed or performed in visit on 04/26/18  HM DIABETES EYE EXAM  Result Value Ref Range   HM Diabetic Eye Exam No Retinopathy No Retinopathy      Assessment & Plan:   Problem List Items Addressed This Visit    None    Visit  Diagnoses    Acute bilateral low back pain without sciatica    -  Primary   Will start naproxen and flexeril and stretches, Call with any concerns or if not getting better.    Relevant Medications   cyclobenzaprine (FLEXERIL) 10 MG tablet   naproxen (NAPROSYN) 500 MG tablet   Flu vaccine need       Flu shot given today.   Relevant Orders   Flu Vaccine QUAD 36+ mos IM       Follow up plan: Return if symptoms worsen or fail to improve.

## 2018-08-08 ENCOUNTER — Ambulatory Visit: Payer: No Typology Code available for payment source | Admitting: Family Medicine

## 2018-08-15 ENCOUNTER — Other Ambulatory Visit: Payer: Self-pay | Admitting: Family Medicine

## 2018-08-15 NOTE — Telephone Encounter (Signed)
Refill of metformin  LRF 02/20/18  #180  1 refill  Last A1C   12/31/17

## 2018-08-26 ENCOUNTER — Other Ambulatory Visit: Payer: Self-pay | Admitting: Family Medicine

## 2018-08-30 MED ORDER — NORTRIPTYLINE HCL 25 MG PO CAPS: 25.0000 mg | ORAL_CAPSULE | Freq: Every day | ORAL | 3 refills | Status: DC

## 2018-08-30 NOTE — Telephone Encounter (Signed)
I have not denied that or discontinued it. I do have her on the nortiptyline 25mg  though- can we double check what she's taking and we'll get her a refill

## 2018-08-30 NOTE — Addendum Note (Signed)
Addended by: Valerie Roys on: 08/30/2018 03:43 PM   Modules accepted: Orders

## 2018-08-30 NOTE — Addendum Note (Signed)
Addended by: Valerie Roys on: 08/30/2018 03:56 PM   Modules accepted: Orders

## 2018-08-30 NOTE — Telephone Encounter (Signed)
She states that she is currently taking 25 mg.

## 2018-08-30 NOTE — Telephone Encounter (Signed)
Patient states that the pharmacy told her that nortriptyline (PAMELOR) 50 MG capsule was denied due to being discontinued. She said that she has been taking it all this time and would like to know why that was discontinued. [phone], patient is currently out of this medication.

## 2018-09-17 ENCOUNTER — Telehealth: Payer: Self-pay

## 2018-09-17 NOTE — Telephone Encounter (Signed)
PA started for Saxenda, awaiting approval or denial.

## 2018-09-20 ENCOUNTER — Encounter: Payer: Self-pay | Admitting: Family Medicine

## 2018-09-20 ENCOUNTER — Telehealth: Payer: Self-pay

## 2018-09-20 ENCOUNTER — Ambulatory Visit (INDEPENDENT_AMBULATORY_CARE_PROVIDER_SITE_OTHER): Payer: No Typology Code available for payment source | Admitting: Family Medicine

## 2018-09-20 ENCOUNTER — Other Ambulatory Visit: Payer: Self-pay

## 2018-09-20 VITALS — BP 108/71 | HR 92 | Temp 98.7°F | Ht 59.0 in | Wt 149.0 lb

## 2018-09-20 DIAGNOSIS — F332 Major depressive disorder, recurrent severe without psychotic features: Secondary | ICD-10-CM | POA: Diagnosis not present

## 2018-09-20 DIAGNOSIS — F419 Anxiety disorder, unspecified: Secondary | ICD-10-CM | POA: Diagnosis not present

## 2018-09-20 DIAGNOSIS — G43009 Migraine without aura, not intractable, without status migrainosus: Secondary | ICD-10-CM | POA: Diagnosis not present

## 2018-09-20 DIAGNOSIS — R7301 Impaired fasting glucose: Secondary | ICD-10-CM

## 2018-09-20 MED ORDER — VENLAFAXINE HCL ER 150 MG PO CP24: 150.0000 mg | ORAL_CAPSULE | Freq: Every day | ORAL | 1 refills | Status: DC

## 2018-09-20 MED ORDER — VENLAFAXINE HCL ER 75 MG PO CP24: 75.0000 mg | ORAL_CAPSULE | Freq: Every day | ORAL | 1 refills | Status: DC

## 2018-09-20 MED ORDER — ARIPIPRAZOLE 2 MG PO TABS: 2.0000 mg | ORAL_TABLET | Freq: Two times a day (BID) | ORAL | 1 refills | Status: DC

## 2018-09-20 MED ORDER — PROPRANOLOL HCL ER 80 MG PO CP24: 80.0000 mg | ORAL_CAPSULE | Freq: Every day | ORAL | 1 refills | Status: DC

## 2018-09-20 MED ORDER — HYDROXYZINE HCL 25 MG PO TABS: 12.5000 mg | ORAL_TABLET | Freq: Three times a day (TID) | ORAL | 3 refills | Status: DC | PRN

## 2018-09-20 MED ORDER — ERENUMAB-AOOE 70 MG/ML ~~LOC~~ SOAJ: 70.0000 mg | SUBCUTANEOUS | 4 refills | Status: DC

## 2018-09-20 MED ORDER — NAPROXEN 500 MG PO TABS: 500.0000 mg | ORAL_TABLET | Freq: Two times a day (BID) | ORAL | 1 refills | Status: DC

## 2018-09-20 NOTE — Assessment & Plan Note (Signed)
Depression doing much better. Continue current regimen 1/2-1 abilify in the AM and 1 in PM to avoid fatigue, call with any concerns.

## 2018-09-20 NOTE — Telephone Encounter (Signed)
PA Submitted for Aimovig.  Key: [id number]

## 2018-09-20 NOTE — Assessment & Plan Note (Addendum)
Not doing well. Will start aimovig and recheck in 3 months. Call with any concerns. Has failed propranolol and imitrex and nortriptyline.

## 2018-09-20 NOTE — Progress Notes (Signed)
BP 108/71   Pulse 92   Temp 98.7 F (37.1 C) (Oral)   Ht 4\' 11"  (1.499 m)   Wt 149 lb (67.6 kg)   SpO2 100%   BMI 30.09 kg/m    Subjective:    Patient ID: Ashley Wiley, female    DOB: 1976-02-23, 42 y.o.   MRN: [id number]  HPI: Ashley Wiley is a 42 y.o. female  Chief Complaint  Patient presents with  . Blood work    A1c   . Medication Management    Abilify   ANXIETY/STRESS- 1st 2 weeks on it, she was feeling awesome, when she went up to 4mg  daily she got really sleepy about 10AM, going back to BID did better, still feeling sleepy, feels like her depression is 90% controlled. Anxiety is not doing well. She notes that she is having trouble concentrating, worrying a lot. Still mourning her dad. Feels like she can function.  Duration:uncontrolled Anxious mood: yes  Excessive worrying: yes Irritability: yes  Sweating: no Nausea: no Palpitations:no Hyperventilation: no Panic attacks: no Agoraphobia: no  Obscessions/compulsions: no Depressed mood: yes Depression screen [facility] 2/9 09/20/2018 07/25/2018 07/16/2018 07/02/2018 05/29/2018  Decreased Interest 1 2 2 3 3   Down, Depressed, Hopeless 1 2 2 3 2   PHQ - 2 Score 2 4 4 6 5   Altered sleeping 3 0 2 3 3   Tired, decreased energy 2 1 2 3 3   Change in appetite 1 1 1 3  0  Feeling bad or failure about yourself  1 1 3 2 1   Trouble concentrating 3 1 3 2 1   Moving slowly or fidgety/restless 1 0 3 3 2   Suicidal thoughts 0 0 0 1 0  PHQ-9 Score 13 8 18 23 15   Difficult doing work/chores Somewhat difficult Very difficult Very difficult Extremely dIfficult Very difficult  Some recent data might be hidden   GAD 7 : Generalized Anxiety Score 09/20/2018 07/25/2018 07/16/2018 07/02/2018  Nervous, Anxious, on Edge 0 2 1 3   Control/stop worrying 3 2 3 3   Worry too much - different things 3 2 3 3   Trouble relaxing 2 3 3 3   Restless 2 0 1 1  Easily annoyed or irritable 2 0 1 2  Afraid - awful might happen 2 1 0 3  Total GAD 7 Score 14 10  12 18   Anxiety Difficulty Very difficult Somewhat difficult Very difficult Very difficult   Anhedonia: no Weight changes: yes Insomnia: no   Hypersomnia: no Fatigue/loss of energy: yes Feelings of worthlessness: yes Feelings of guilt: yes Impaired concentration/indecisiveness: yes Suicidal ideations: no  Crying spells: no Recent Stressors/Life Changes: yes   Relationship problems: yes   Family stress: yes     Financial stress: yes    Job stress: yes    Recent death/loss: yes  Migraines are getting worse. No better with nortriptyline or propanolol. Pain is behind R eye. + visual changes. Continuing daily.  Impaired Fasting Glucose HbA1C:  Lab Results  Component Value Date   HGBA1C 5.7 (H) 12/31/2017   Duration of elevated blood sugar: chronic Polydipsia: no Polyuria: no Weight change: no Visual disturbance: no Glucose Monitoring: yes  Relevant past medical, surgical, family and social history reviewed and updated as indicated. Interim medical history since our last visit reviewed. Allergies and medications reviewed and updated.  Review of Systems  Constitutional: Negative.   HENT: Negative.   Respiratory: Negative.   Cardiovascular: Negative.   Gastrointestinal: Negative.   Skin: Negative.   Neurological:  Positive for headaches. Negative for dizziness, tremors, seizures, syncope, facial asymmetry, speech difficulty, weakness, light-headedness and numbness.  Psychiatric/Behavioral: Positive for decreased concentration. Negative for agitation, behavioral problems, confusion, dysphoric mood, hallucinations, self-injury, sleep disturbance and suicidal ideas. The patient is nervous/anxious. The patient is not hyperactive.     Per HPI unless specifically indicated above     Objective:    BP 108/71   Pulse 92   Temp 98.7 F (37.1 C) (Oral)   Ht 4\' 11"  (1.499 m)   Wt 149 lb (67.6 kg)   SpO2 100%   BMI 30.09 kg/m   Wt Readings from Last 3 Encounters:  09/20/18  149 lb (67.6 kg)  07/25/18 153 lb 5 oz (69.5 kg)  07/16/18 151 lb 5 oz (68.6 kg)    Physical Exam  Constitutional: She is oriented to person, place, and time. She appears well-developed and well-nourished. No distress.  HENT:  Head: Normocephalic and atraumatic.  Right Ear: Hearing normal.  Left Ear: Hearing normal.  Nose: Nose normal.  Eyes: Conjunctivae and lids are normal. Right eye exhibits no discharge. Left eye exhibits no discharge. No scleral icterus.  Cardiovascular: Normal rate, regular rhythm, normal heart sounds and intact distal pulses. Exam reveals no gallop and no friction rub.  No murmur heard. Pulmonary/Chest: Effort normal and breath sounds normal. No stridor. No respiratory distress. She has no wheezes. She has no rales. She exhibits no tenderness.  Musculoskeletal: Normal range of motion.  Neurological: She is alert and oriented to person, place, and time.  Skin: Skin is warm, dry and intact. Capillary refill takes less than 2 seconds. No rash noted. She is not diaphoretic. No erythema. No pallor.  Psychiatric: She has a normal mood and affect. Her speech is normal and behavior is normal. Judgment and thought content normal. Cognition and memory are normal.  Nursing note and vitals reviewed.   Results for orders placed or performed in visit on 04/26/18  HM DIABETES EYE EXAM  Result Value Ref Range   HM Diabetic Eye Exam No Retinopathy No Retinopathy      Assessment & Plan:   Problem List Items Addressed This Visit      Cardiovascular and Mediastinum   Migraine without aura and without status migrainosus, not intractable - Primary    Not doing well. Will start aimovig and recheck in 3 months. Call with any concerns. Has failed propranolol and imitrex and nortriptyline.      Relevant Medications   naproxen (NAPROSYN) 500 MG tablet   propranolol ER (INDERAL LA) 80 MG 24 hr capsule   venlafaxine XR (EFFEXOR XR) 75 MG 24 hr capsule   venlafaxine XR (EFFEXOR-XR)  150 MG 24 hr capsule   Erenumab-aooe (AIMOVIG) 70 MG/ML SOAJ     Endocrine   IFG (impaired fasting glucose)    Doing great with A1c of 5.0- will stop metformin and continue saxenda. Call with any concerns.       Relevant Orders   Bayer DCA Hb A1c Waived     Other   Depression    Depression doing much better. Continue current regimen 1/2-1 abilify in the AM and 1 in PM to avoid fatigue, call with any concerns.       Relevant Medications   hydrOXYzine (ATARAX/VISTARIL) 25 MG tablet   venlafaxine XR (EFFEXOR XR) 75 MG 24 hr capsule   venlafaxine XR (EFFEXOR-XR) 150 MG 24 hr capsule   Acute anxiety    Not doing great. Buspar not helping. Will change  to hydroxyzine and recheck 3 months.       Relevant Medications   hydrOXYzine (ATARAX/VISTARIL) 25 MG tablet   venlafaxine XR (EFFEXOR XR) 75 MG 24 hr capsule   venlafaxine XR (EFFEXOR-XR) 150 MG 24 hr capsule       Follow up plan: Return in about 3 months (around 12/21/2018).

## 2018-09-20 NOTE — Assessment & Plan Note (Signed)
Doing great with A1c of 5.0- will stop metformin and continue saxenda. Call with any concerns.

## 2018-09-20 NOTE — Assessment & Plan Note (Signed)
Not doing great. Buspar not helping. Will change to hydroxyzine and recheck 3 months.

## 2018-09-21 LAB — BAYER DCA HB A1C WAIVED: HB A1C: 5 % (ref <7.0)

## 2018-09-23 NOTE — Telephone Encounter (Signed)
PA was approved, pharmacy notified  

## 2018-09-30 ENCOUNTER — Ambulatory Visit: Payer: No Typology Code available for payment source | Admitting: Family Medicine

## 2018-10-10 ENCOUNTER — Telehealth: Payer: No Typology Code available for payment source | Admitting: Physician Assistant

## 2018-10-10 DIAGNOSIS — B9689 Other specified bacterial agents as the cause of diseases classified elsewhere: Secondary | ICD-10-CM | POA: Diagnosis not present

## 2018-10-10 DIAGNOSIS — J019 Acute sinusitis, unspecified: Secondary | ICD-10-CM | POA: Diagnosis not present

## 2018-10-10 MED ORDER — AMOXICILLIN-POT CLAVULANATE 875-125 MG PO TABS: 1.0000 | ORAL_TABLET | Freq: Two times a day (BID) | ORAL | 0 refills | Status: DC

## 2018-10-10 NOTE — Progress Notes (Signed)

## 2018-10-22 ENCOUNTER — Ambulatory Visit (INDEPENDENT_AMBULATORY_CARE_PROVIDER_SITE_OTHER): Payer: No Typology Code available for payment source | Admitting: Family Medicine

## 2018-10-22 ENCOUNTER — Encounter: Payer: Self-pay | Admitting: Family Medicine

## 2018-10-22 VITALS — BP 122/82 | HR 91 | Temp 98.6°F | Wt 155.0 lb

## 2018-10-22 DIAGNOSIS — J0101 Acute recurrent maxillary sinusitis: Secondary | ICD-10-CM | POA: Diagnosis not present

## 2018-10-22 MED ORDER — PREDNISONE 50 MG PO TABS: 50.0000 mg | ORAL_TABLET | Freq: Every day | ORAL | 0 refills | Status: DC

## 2018-10-22 MED ORDER — AMOXICILLIN-POT CLAVULANATE 875-125 MG PO TABS
1.0000 | ORAL_TABLET | Freq: Two times a day (BID) | ORAL | 0 refills | Status: DC
Start: 2018-10-22 — End: 2018-12-13

## 2018-10-22 NOTE — Progress Notes (Signed)
BP 122/82   Pulse 91   Temp 98.6 F (37 C) (Oral)   Wt 155 lb (70.3 kg)   SpO2 95%   BMI 31.31 kg/m    Subjective:    Patient ID: Ashley Wiley, female    DOB: 08-29-1976, 42 y.o.   MRN: [id number]  HPI: Ashley Wiley is a 42 y.o. female  Chief Complaint  Patient presents with  . Facial Pain    headache, congestion, x 3 week. Did E visit and has had 1 round of antibiotics; had relief while on medication, symptoms returned after completing RX   UPPER RESPIRATORY TRACT INFECTION Duration: 3 weeks Worst symptom: congestion Fever: no Cough: no Shortness of breath: no Wheezing: no Chest pain: no Chest tightness: no Chest congestion: no Nasal congestion: yes Runny nose: yes Post nasal drip: yes Sneezing: yes Sore throat: yes Swollen glands: yes Sinus pressure: yes Headache: yes Face pain: yes Toothache: no Ear pain: yes bilateral Ear pressure: yes bilateral Eyes red/itching:no Eye drainage/crusting: yes  Vomiting: no Rash: no Fatigue: yes Sick contacts: yes Strep contacts: yes  Context: worse Recurrent sinusitis: yes Relief with OTC cold/cough medications: no  Treatments attempted: antibiotics   Relevant past medical, surgical, family and social history reviewed and updated as indicated. Interim medical history since our last visit reviewed. Allergies and medications reviewed and updated.  Review of Systems  Constitutional: Positive for fatigue. Negative for activity change, appetite change, chills, diaphoresis, fever and unexpected weight change.  HENT: Positive for congestion, postnasal drip, rhinorrhea, sinus pressure, sinus pain, sneezing and sore throat. Negative for dental problem, drooling, ear discharge, ear pain, facial swelling, hearing loss, mouth sores, nosebleeds, tinnitus, trouble swallowing and voice change.   Eyes: Negative.   Respiratory: Negative.   Cardiovascular: Negative.   Neurological: Positive for headaches.    Psychiatric/Behavioral: Negative.     Per HPI unless specifically indicated above     Objective:    BP 122/82   Pulse 91   Temp 98.6 F (37 C) (Oral)   Wt 155 lb (70.3 kg)   SpO2 95%   BMI 31.31 kg/m   Wt Readings from Last 3 Encounters:  10/22/18 155 lb (70.3 kg)  09/20/18 149 lb (67.6 kg)  07/25/18 153 lb 5 oz (69.5 kg)    Physical Exam  Constitutional: She is oriented to person, place, and time. She appears well-developed and well-nourished. No distress.  HENT:  Head: Normocephalic and atraumatic.  Right Ear: Hearing, tympanic membrane, external ear and ear canal normal.  Left Ear: Hearing, tympanic membrane, external ear and ear canal normal.  Nose: Mucosal edema and rhinorrhea present. Right sinus exhibits maxillary sinus tenderness. Right sinus exhibits no frontal sinus tenderness. Left sinus exhibits no maxillary sinus tenderness and no frontal sinus tenderness.  Mouth/Throat: Uvula is midline, oropharynx is clear and moist and mucous membranes are normal. No oropharyngeal exudate.  Eyes: Pupils are equal, round, and reactive to light. Conjunctivae, EOM and lids are normal. Right eye exhibits no discharge. Left eye exhibits no discharge. No scleral icterus.  Neck: Normal range of motion. Neck supple. No JVD present. No tracheal deviation present. No thyromegaly present.  Cardiovascular: Normal rate, regular rhythm, normal heart sounds and intact distal pulses. Exam reveals no gallop and no friction rub.  No murmur heard. Pulmonary/Chest: Effort normal and breath sounds normal. No stridor. No respiratory distress. She has no wheezes. She has no rales. She exhibits no tenderness.  Musculoskeletal: Normal range of motion.  Lymphadenopathy:  She has no cervical adenopathy.  Neurological: She is alert and oriented to person, place, and time.  Skin: Skin is warm, dry and intact. Capillary refill takes less than 2 seconds. No rash noted. She is not diaphoretic. No erythema.  No pallor.  Psychiatric: She has a normal mood and affect. Her speech is normal and behavior is normal. Judgment and thought content normal. Cognition and memory are normal.  Nursing note and vitals reviewed.   Results for orders placed or performed in visit on 09/20/18  Bayer DCA Hb A1c Waived  Result Value Ref Range   HB A1C (BAYER DCA - WAIVED) 5.0 <7.0 %      Assessment & Plan:   Problem List Items Addressed This Visit    None    Visit Diagnoses    Acute recurrent maxillary sinusitis    -  Primary   Will treat with burst of prednisone and 10 days of augmentin. If not getting better, will consider levaquin. Call with any concerns.    Relevant Medications   amoxicillin-clavulanate (AUGMENTIN) 875-125 MG tablet   predniSONE (DELTASONE) 50 MG tablet       Follow up plan: Return if symptoms worsen or fail to improve.

## 2018-12-09 ENCOUNTER — Other Ambulatory Visit: Payer: Self-pay | Admitting: Family Medicine

## 2018-12-09 NOTE — Telephone Encounter (Signed)
Patient has appointment 12/13/18. Requested Prescriptions  Pending Prescriptions Disp Refills  . nortriptyline (PAMELOR) 25 MG capsule [Pharmacy Med Name: NORTRIPTYLINE HCL 25 MG CAP 25 CAP] 90 capsule 0    Sig: TAKE 1 CAPSULE BY MOUTH AT BEDTIME.     Psychiatry:  Antidepressants - Heterocyclics (TCAs) Passed - 12/09/2018  8:53 AM      Passed - Completed PHQ-2 or PHQ-9 in the last 360 days.      Passed - Valid encounter within last 6 months    Recent Outpatient Visits          1 month ago Acute recurrent maxillary sinusitis   [facility] [place], Megan P, DO   2 months ago Migraine without aura and without status migrainosus, not intractable   [place], Megan P, DO   4 months ago Acute bilateral low back pain without sciatica   [facility], Megan P, DO   4 months ago Severe episode of recurrent major depressive disorder, without psychotic features ([place])   [place], Megan P, DO   5 months ago Severe episode of recurrent major depressive disorder, without psychotic features ([place])   [place], Barb Merino, DO      Future Appointments            In 4 days Wynetta Emery, Barb Merino, DO [facility]

## 2018-12-13 ENCOUNTER — Encounter: Payer: Self-pay | Admitting: Family Medicine

## 2018-12-13 ENCOUNTER — Other Ambulatory Visit: Payer: Self-pay

## 2018-12-13 ENCOUNTER — Ambulatory Visit (INDEPENDENT_AMBULATORY_CARE_PROVIDER_SITE_OTHER): Payer: No Typology Code available for payment source | Admitting: Family Medicine

## 2018-12-13 VITALS — BP 125/82 | HR 77 | Temp 98.8°F | Ht 59.0 in | Wt 153.5 lb

## 2018-12-13 DIAGNOSIS — G500 Trigeminal neuralgia: Secondary | ICD-10-CM

## 2018-12-13 DIAGNOSIS — G43009 Migraine without aura, not intractable, without status migrainosus: Secondary | ICD-10-CM

## 2018-12-13 DIAGNOSIS — E041 Nontoxic single thyroid nodule: Secondary | ICD-10-CM | POA: Diagnosis not present

## 2018-12-13 DIAGNOSIS — F332 Major depressive disorder, recurrent severe without psychotic features: Secondary | ICD-10-CM | POA: Diagnosis not present

## 2018-12-13 MED ORDER — CICLOPIROX 8 % EX SOLN: CUTANEOUS | 3 refills | Status: DC

## 2018-12-13 MED ORDER — NORTRIPTYLINE HCL 50 MG PO CAPS: 50.0000 mg | ORAL_CAPSULE | Freq: Every day | ORAL | 1 refills | Status: DC

## 2018-12-13 MED ORDER — ERENUMAB-AOOE 70 MG/ML ~~LOC~~ SOAJ: 140.0000 mg | SUBCUTANEOUS | 4 refills | Status: DC

## 2018-12-13 NOTE — Assessment & Plan Note (Signed)
Not doing great, but stable. Continue current regimen. Call with any concerns. Continue to monitor.

## 2018-12-13 NOTE — Assessment & Plan Note (Signed)
Only getting 2 weeks of relief with aimovig 70. Will increase to 140mg  and recheck 2 months. Call with any concerns.

## 2018-12-13 NOTE — Progress Notes (Signed)
BP 125/82   Pulse 77   Temp 98.8 F (37.1 C) (Oral)   Ht 4\' 11"  (1.499 m)   Wt 153 lb 8 oz (69.6 kg)   SpO2 100%   BMI 31.00 kg/m    Subjective:    Patient ID: Ashley Wiley, female    DOB: 05/12/1976, 43 y.o.   MRN: [id number]  HPI: Ashley Wiley is a 43 y.o. female  Chief Complaint  Patient presents with  . Migraine    pt states her migraine continues  . Toe Pain    bilateral toenails pain x 1 month ago   Has been under a lot of stress with her family- feels like she is handling it OK.  MIGRAINES- had a migraine from Sunday to Monday, had bad symptoms. Went away on Monday around 2PM after taking naproxen, feeling better today. Has been doing better with the injection, no bad headaches for about 2 week, then start coming back. Always has her facial pain, but then her head gets worse and she has nausea, photophobia and severe headache. Cannot take   Started with fungus on her toes about a month ago. It had been getting better, but then started again. It's aching and sore. She is otherwise doing well with no other concerns or complaints at this time.   Relevant past medical, surgical, family and social history reviewed and updated as indicated. Interim medical history since our last visit reviewed. Allergies and medications reviewed and updated.  Review of Systems  Constitutional: Negative.   Respiratory: Negative.   Cardiovascular: Negative.   Genitourinary: Negative.   Musculoskeletal: Negative.   Skin: Negative.   Neurological: Positive for headaches. Negative for dizziness, tremors, seizures, syncope, facial asymmetry, speech difficulty, weakness, light-headedness and numbness.  Psychiatric/Behavioral: Positive for dysphoric mood and sleep disturbance. Negative for agitation, behavioral problems, confusion, decreased concentration, hallucinations, self-injury and suicidal ideas. The patient is nervous/anxious. The patient is not hyperactive.     Per HPI unless  specifically indicated above     Objective:    BP 125/82   Pulse 77   Temp 98.8 F (37.1 C) (Oral)   Ht 4\' 11"  (1.499 m)   Wt 153 lb 8 oz (69.6 kg)   SpO2 100%   BMI 31.00 kg/m   Wt Readings from Last 3 Encounters:  12/13/18 153 lb 8 oz (69.6 kg)  10/22/18 155 lb (70.3 kg)  09/20/18 149 lb (67.6 kg)    Physical Exam Vitals signs and nursing note reviewed.  Constitutional:      General: She is not in acute distress.    Appearance: Normal appearance. She is not ill-appearing, toxic-appearing or diaphoretic.  HENT:     Head: Normocephalic and atraumatic.     Right Ear: External ear normal.     Left Ear: External ear normal.     Nose: Nose normal.     Mouth/Throat:     Mouth: Mucous membranes are moist.     Pharynx: Oropharynx is clear.  Eyes:     General: No scleral icterus.       Right eye: No discharge.        Left eye: No discharge.     Extraocular Movements: Extraocular movements intact.     Conjunctiva/sclera: Conjunctivae normal.     Pupils: Pupils are equal, round, and reactive to light.  Neck:     Musculoskeletal: Normal range of motion and neck supple.  Cardiovascular:     Rate and Rhythm: Normal rate  and regular rhythm.     Pulses: Normal pulses.     Heart sounds: Normal heart sounds. No murmur. No friction rub. No gallop.   Pulmonary:     Effort: Pulmonary effort is normal. No respiratory distress.     Breath sounds: Normal breath sounds. No stridor. No wheezing, rhonchi or rales.  Chest:     Chest wall: No tenderness.  Musculoskeletal: Normal range of motion.  Skin:    General: Skin is warm and dry.     Capillary Refill: Capillary refill takes less than 2 seconds.     Coloration: Skin is not jaundiced or pale.     Findings: No bruising, erythema, lesion or rash.  Neurological:     General: No focal deficit present.     Mental Status: She is alert and oriented to person, place, and time. Mental status is at baseline.  Psychiatric:        Mood and  Affect: Mood normal.        Behavior: Behavior normal.        Thought Content: Thought content normal.        Judgment: Judgment normal.     Results for orders placed or performed in visit on 09/20/18  Bayer DCA Hb A1c Waived  Result Value Ref Range   HB A1C (BAYER DCA - WAIVED) 5.0 <7.0 %      Assessment & Plan:   Problem List Items Addressed This Visit      Cardiovascular and Mediastinum   Migraine without aura and without status migrainosus, not intractable - Primary    Only getting 2 weeks of relief with aimovig 70. Will increase to 140mg  and recheck 2 months. Call with any concerns.       Relevant Medications   Erenumab-aooe (AIMOVIG) 70 MG/ML SOAJ   nortriptyline (PAMELOR) 50 MG capsule     Endocrine   Thyroid nodule    Due for recheck on [place]. Ordered today. Await results.       Relevant Orders   US THYROID     Nervous and Auditory   Trigeminal neuralgia    Still not doing great. Will increase her nortripyline to 50mg  and recheck 2 months. Call with any concerns.       Relevant Medications   nortriptyline (PAMELOR) 50 MG capsule     Other   Depression    Not doing great, but stable. Continue current regimen. Call with any concerns. Continue to monitor.       Relevant Medications   nortriptyline (PAMELOR) 50 MG capsule       Follow up plan: Return in about 2 weeks (around 12/27/2018).

## 2018-12-13 NOTE — Assessment & Plan Note (Signed)
Due for recheck on [place]. Ordered today. Await results.

## 2018-12-13 NOTE — Assessment & Plan Note (Signed)
Still not doing great. Will increase her nortripyline to 50mg  and recheck 2 months. Call with any concerns.

## 2018-12-17 ENCOUNTER — Ambulatory Visit
Admission: RE | Admit: 2018-12-17 | Discharge: 2018-12-17 | Disposition: A | Discharge status: Home / Self Care | Payer: No Typology Code available for payment source | Source: Ambulatory Visit | Attending: Family Medicine | Admitting: Family Medicine

## 2018-12-17 DIAGNOSIS — E041 Nontoxic single thyroid nodule: Secondary | ICD-10-CM | POA: Diagnosis present

## 2018-12-18 ENCOUNTER — Ambulatory Visit: Payer: No Typology Code available for payment source

## 2018-12-24 ENCOUNTER — Other Ambulatory Visit: Payer: Self-pay | Admitting: Family Medicine

## 2019-01-29 ENCOUNTER — Other Ambulatory Visit (HOSPITAL_COMMUNITY): Payer: Self-pay | Admitting: Neurosurgery

## 2019-01-29 DIAGNOSIS — I671 Cerebral aneurysm, nonruptured: Secondary | ICD-10-CM

## 2019-01-30 ENCOUNTER — Encounter: Payer: Self-pay | Admitting: Family Medicine

## 2019-01-30 ENCOUNTER — Ambulatory Visit (INDEPENDENT_AMBULATORY_CARE_PROVIDER_SITE_OTHER): Payer: No Typology Code available for payment source | Admitting: Family Medicine

## 2019-01-30 ENCOUNTER — Other Ambulatory Visit: Payer: Self-pay

## 2019-01-30 VITALS — BP 123/85 | HR 81 | Temp 98.1°F | Ht 59.0 in | Wt 160.0 lb

## 2019-01-30 DIAGNOSIS — R61 Generalized hyperhidrosis: Secondary | ICD-10-CM

## 2019-01-30 LAB — CBC WITH DIFFERENTIAL/PLATELET
Hematocrit: 36.2 % (ref 34.0–46.6)
Hemoglobin: 12.1 g/dL (ref 11.1–15.9)
LYMPHS ABS: 1.8 10*3/uL (ref 0.7–3.1)
Lymphs: 30 %
MCH: 30 pg (ref 26.6–33.0)
MCHC: 33.4 g/dL (ref 31.5–35.7)
MCV: 90 fL (ref 79–97)
MID (ABSOLUTE): 0.5 10*3/uL (ref 0.1–1.6)
MID: 8 %
Neutrophils Absolute: 3.8 10*3/uL (ref 1.4–7.0)
Neutrophils: 62 %
Platelets: 204 10*3/uL (ref 150–450)
RBC: 4.03 x10E6/uL (ref 3.77–5.28)
RDW: 13.2 % (ref 11.7–15.4)
WBC: 6.1 10*3/uL (ref 3.4–10.8)

## 2019-01-30 MED ORDER — FLUTICASONE PROPIONATE 50 MCG/ACT NA SUSP: 2.0000 | Freq: Every day | NASAL | 12 refills | Status: DC

## 2019-01-30 MED ORDER — LORAZEPAM 0.5 MG PO TABS: 0.5000 mg | ORAL_TABLET | Freq: Every evening | ORAL | 1 refills | Status: DC | PRN

## 2019-01-30 NOTE — Progress Notes (Signed)
BP 123/85   Pulse 81   Temp 98.1 F (36.7 C) (Oral)   Ht 4\' 11"  (1.499 m)   Wt 160 lb (72.6 kg)   SpO2 98%   BMI 32.32 kg/m    Subjective:    Patient ID: Ashley Wiley, female    DOB: 01-31-1976, 43 y.o.   MRN: [id number]  HPI: JERMIYAH RICOTTA is a 43 y.o. female  Chief Complaint  Patient presents with  . Night Sweats    x 3 months. pt states not every night but every 3-4 nights a month   Has been having night sweats for the past 3 months about 3-4 nights a month. She notes that when it happens she is unable to sleep all night. Makes her very tired the next day. She completely soaks the sheets and it wakes her up and makes it impossible for her to sleep. She notes that it is happening about 1-2x a week (3-4x a month). She is not having any flushes during the day. They are not seeming to come around her period. She is otherwise feeling well with no other concerns or complaints at this time.   Relevant past medical, surgical, family and social history reviewed and updated as indicated. Interim medical history since our last visit reviewed. Allergies and medications reviewed and updated.  Review of Systems  Constitutional: Positive for diaphoresis. Negative for activity change, appetite change, chills, fatigue, fever and unexpected weight change.  HENT: Negative.   Respiratory: Negative.   Cardiovascular: Negative.   Neurological: Negative.   Psychiatric/Behavioral: Negative.     Per HPI unless specifically indicated above     Objective:    BP 123/85   Pulse 81   Temp 98.1 F (36.7 C) (Oral)   Ht 4\' 11"  (1.499 m)   Wt 160 lb (72.6 kg)   SpO2 98%   BMI 32.32 kg/m   Wt Readings from Last 3 Encounters:  01/30/19 160 lb (72.6 kg)  12/13/18 153 lb 8 oz (69.6 kg)  10/22/18 155 lb (70.3 kg)    Physical Exam Vitals signs and nursing note reviewed.  Constitutional:      General: She is not in acute distress.    Appearance: Normal appearance. She is not  ill-appearing, toxic-appearing or diaphoretic.  HENT:     Head: Normocephalic and atraumatic.     Right Ear: External ear normal.     Left Ear: External ear normal.     Nose: Nose normal.     Mouth/Throat:     Mouth: Mucous membranes are moist.     Pharynx: Oropharynx is clear.  Eyes:     General: No scleral icterus.       Right eye: No discharge.        Left eye: No discharge.     Extraocular Movements: Extraocular movements intact.     Conjunctiva/sclera: Conjunctivae normal.     Pupils: Pupils are equal, round, and reactive to light.  Neck:     Musculoskeletal: Normal range of motion and neck supple.  Cardiovascular:     Rate and Rhythm: Normal rate and regular rhythm.     Pulses: Normal pulses.     Heart sounds: Normal heart sounds. No murmur. No friction rub. No gallop.   Pulmonary:     Effort: Pulmonary effort is normal. No respiratory distress.     Breath sounds: Normal breath sounds. No stridor. No wheezing, rhonchi or rales.  Chest:     Chest wall: No  tenderness.  Musculoskeletal: Normal range of motion.  Skin:    General: Skin is warm and dry.     Capillary Refill: Capillary refill takes less than 2 seconds.     Coloration: Skin is not jaundiced or pale.     Findings: No bruising, erythema, lesion or rash.  Neurological:     General: No focal deficit present.     Mental Status: She is alert and oriented to person, place, and time. Mental status is at baseline.  Psychiatric:        Mood and Affect: Mood normal.        Behavior: Behavior normal.        Thought Content: Thought content normal.        Judgment: Judgment normal.     Results for orders placed or performed in visit on 01/30/19  CBC With Differential/Platelet  Result Value Ref Range   WBC 6.1 3.4 - 10.8 x10E3/uL   RBC 4.03 3.77 - 5.28 x10E6/uL   Hemoglobin 12.1 11.1 - 15.9 g/dL   Hematocrit 36.2 34.0 - 46.6 %   MCV 90 79 - 97 fL   MCH 30.0 26.6 - 33.0 pg   MCHC 33.4 31.5 - 35.7 g/dL   RDW 13.2  11.7 - 15.4 %   Platelets 204 150 - 450 x10E3/uL   Neutrophils 62 Not Estab. %   Lymphs 30 Not Estab. %   MID 8 Not Estab. %   Neutrophils Absolute 3.8 1.4 - 7.0 x10E3/uL   Lymphocytes Absolute 1.8 0.7 - 3.1 x10E3/uL   MID (Absolute) 0.5 0.1 - 1.6 X10E3/uL  TSH  Result Value Ref Range   TSH 1.560 0.450 - 4.500 uIU/mL  QuantiFERON-TB Gold Plus  Result Value Ref Range   QuantiFERON Incubation WILL FOLLOW    QuantiFERON-TB Gold Plus WILL FOLLOW   Estradiol  Result Value Ref Range   Estradiol 376.7 pg/mL  LH  Result Value Ref Range   LH 6.0 mIU/mL  FSH  Result Value Ref Range   FSH 1.5 mIU/mL  Testosterone, free, total([facility])  Result Value Ref Range   Testosterone 9 8 - 48 ng/dL   Testosterone, Free WILL FOLLOW    Sex Hormone Binding 78.5 24.6 - 122.0 nmol/L      Assessment & Plan:   Problem List Items Addressed This Visit    None    Visit Diagnoses    Night sweats    -  Primary   Probably medication related. Will check labs and give ativan for sleep to be used very sparingly. Await results. Call with any concerns.    Relevant Orders   CBC With Differential/Platelet (Completed)   TSH (Completed)   QuantiFERON-TB Gold Plus (Completed)   Estradiol (Completed)   LH (Completed)   FSH (Completed)   Testosterone, free, total([facility]) (Completed)       Follow up plan: Return if symptoms worsen or fail to improve.

## 2019-01-31 ENCOUNTER — Encounter: Payer: Self-pay | Admitting: Family Medicine

## 2019-02-02 LAB — TESTOSTERONE, FREE, TOTAL, SHBG
Sex Hormone Binding: 78.5 nmol/L (ref 24.6–122.0)
Testosterone, Free: 0.7 pg/mL (ref 0.0–4.2)
Testosterone: 9 ng/dL (ref 8–48)

## 2019-02-02 LAB — QUANTIFERON-TB GOLD PLUS
QuantiFERON Mitogen Value: >10 IU/mL
QuantiFERON Nil Value: 0.02 IU/mL
QuantiFERON TB1 Ag Value: 0.07 IU/mL
QuantiFERON TB2 Ag Value: 0.04 IU/mL
QuantiFERON-TB Gold Plus: NEGATIVE

## 2019-02-02 LAB — ESTRADIOL: ESTRADIOL: 376.7 pg/mL

## 2019-02-02 LAB — TSH: TSH: 1.56 u[IU]/mL (ref 0.450–4.500)

## 2019-02-02 LAB — FOLLICLE STIMULATING HORMONE: FSH: 1.5 m[IU]/mL

## 2019-02-02 LAB — LUTEINIZING HORMONE: LH: 6 m[IU]/mL

## 2019-02-04 ENCOUNTER — Other Ambulatory Visit: Payer: Self-pay | Admitting: Neurosurgery

## 2019-02-13 ENCOUNTER — Ambulatory Visit: Payer: No Typology Code available for payment source | Admitting: Family Medicine

## 2019-02-19 ENCOUNTER — Telehealth: Payer: Self-pay | Admitting: Family Medicine

## 2019-02-19 DIAGNOSIS — I671 Cerebral aneurysm, nonruptured: Secondary | ICD-10-CM

## 2019-02-19 NOTE — Telephone Encounter (Signed)
Copied from [place] [id number]. Topic: Referral - Request for Referral >> Feb 19, 2019  8:43 AM Robina Ade, Helene Kelp D wrote: Has patient seen PCP for this complaint? Yes *If NO, is insurance requiring patient see PCP for this issue before PCP can refer them? Referral for which specialty: Neurosurgery Preferred provider/office: [facility]  Reason for referral: 1 year F/U appt and needs a new referral

## 2019-02-24 ENCOUNTER — Encounter: Payer: Self-pay | Admitting: Family Medicine

## 2019-02-27 ENCOUNTER — Encounter: Payer: Self-pay | Admitting: Family Medicine

## 2019-02-27 ENCOUNTER — Other Ambulatory Visit: Payer: Self-pay

## 2019-02-27 ENCOUNTER — Ambulatory Visit (INDEPENDENT_AMBULATORY_CARE_PROVIDER_SITE_OTHER): Payer: No Typology Code available for payment source | Admitting: Family Medicine

## 2019-02-27 VITALS — BP 121/78 | HR 109 | Temp 97.7°F | Ht 59.0 in | Wt 157.0 lb

## 2019-02-27 DIAGNOSIS — F332 Major depressive disorder, recurrent severe without psychotic features: Secondary | ICD-10-CM | POA: Diagnosis not present

## 2019-02-27 MED ORDER — ARIPIPRAZOLE 5 MG PO TABS: 5.0000 mg | ORAL_TABLET | Freq: Two times a day (BID) | ORAL | 3 refills | Status: DC

## 2019-02-27 MED ORDER — VITAMIN D (ERGOCALCIFEROL) 1.25 MG (50000 UNIT) PO CAPS: 50000.0000 [IU] | ORAL_CAPSULE | ORAL | 0 refills | Status: DC

## 2019-02-27 NOTE — Assessment & Plan Note (Signed)
Not doing well at all. Anxiety and depression are a lot worse. Hydroxyzine and lorazepam make her quite sleepy, so she can't take them at work. Buspar did nothing for her in the past. Will increase her abilify to 5mg  daily and recheck 2 weeks. Encouraged patient to call if getting worse and not to wait 2 weeks. Call with any concerns.

## 2019-02-27 NOTE — Progress Notes (Signed)
BP 121/78   Pulse (!) 109   Temp 97.7 F (36.5 C) (Oral)   Ht 4\' 11"  (1.499 m)   Wt 157 lb (71.2 kg)   BMI 31.71 kg/m    Subjective:    Patient ID: Ashley Wiley, female    DOB: [birth year], 43 y.o.   MRN: [id number]  HPI: Ashley Wiley is a 43 y.o. female  Chief Complaint  Patient presents with  . Follow-up  . Anxiety   ANXIETY/STRESS- has not been doing well. Notes that she wants to sleep more than eat right now. Feels like she's starting to get to where she was in the Summer when she had to go out of work for a month. She has been under a lot of stress with the COVID-19 pandemic. She doesn't want to get that bad again, but is not feeling well. Most of her stress is when she's at work, but she's afraid that she's getting there again. Duration:exacerbated Anxious mood: yes  Excessive worrying: yes Irritability: yes  Sweating: no Nausea: yes Palpitations:yes Hyperventilation: no Panic attacks: yes Agoraphobia: no  Obscessions/compulsions: no Depressed mood: yes Depression screen [facility] 2/9 02/27/2019 12/13/2018 09/20/2018 07/25/2018 07/16/2018  Decreased Interest 3 2 1 2 2   Down, Depressed, Hopeless 3 2 1 2 2   PHQ - 2 Score 6 4 2 4 4   Altered sleeping 3 2 3  0 2  Tired, decreased energy 3 3 2 1 2   Change in appetite 3 3 1 1 1   Feeling bad or failure about yourself  3 2 1 1 3   Trouble concentrating 3 2 3 1 3   Moving slowly or fidgety/restless 3 1 1  0 3  Suicidal thoughts 0 0 0 0 0  PHQ-9 Score 24 17 13 8 18   Difficult doing work/chores Extremely dIfficult Very difficult Somewhat difficult Very difficult Very difficult  Some recent data might be hidden   GAD 7 : Generalized Anxiety Score 02/27/2019 12/13/2018 09/20/2018 07/25/2018  Nervous, Anxious, on Edge 3 3 0 2  Control/stop worrying 3 3 3 2   Worry too much - different things 3 3 3 2   Trouble relaxing 3 3 2 3   Restless 3 3 2  0  Easily annoyed or irritable 2 2 2  0  Afraid - awful might happen 3 3 2 1   Total GAD 7  Score 20 20 14 10   Anxiety Difficulty Extremely difficult Somewhat difficult Very difficult Somewhat difficult   Anhedonia: no Weight changes: no Insomnia: no   Hypersomnia: yes Fatigue/loss of energy: yes Feelings of worthlessness: yes Feelings of guilt: yes Impaired concentration/indecisiveness: yes Suicidal ideations: no  Crying spells: yes Recent Stressors/Life Changes: yes   Relationship problems: no   Family stress: yes     Financial stress: yes    Job stress: yes    Recent death/loss: no   Relevant past medical, surgical, family and social history reviewed and updated as indicated. Interim medical history since our last visit reviewed. Allergies and medications reviewed and updated.  Review of Systems  Constitutional: Positive for appetite change and fatigue. Negative for activity change, chills, diaphoresis, fever and unexpected weight change.  Respiratory: Negative.   Cardiovascular: Negative.   Musculoskeletal: Negative.   Skin: Negative.   Neurological: Negative.   Psychiatric/Behavioral: Positive for behavioral problems, decreased concentration, dysphoric mood and sleep disturbance. Negative for agitation, confusion, hallucinations, self-injury and suicidal ideas. The patient is nervous/anxious. The patient is not hyperactive.     Per HPI unless specifically indicated above  Objective:    BP 121/78   Pulse (!) 109   Temp 97.7 F (36.5 C) (Oral)   Ht 4\' 11"  (1.499 m)   Wt 157 lb (71.2 kg)   BMI 31.71 kg/m   Wt Readings from Last 3 Encounters:  02/27/19 157 lb (71.2 kg)  01/30/19 160 lb (72.6 kg)  12/13/18 153 lb 8 oz (69.6 kg)    Physical Exam Vitals signs and nursing note reviewed.  Constitutional:      General: She is not in acute distress.    Appearance: Normal appearance. She is not ill-appearing, toxic-appearing or diaphoretic.  HENT:     Head: Normocephalic and atraumatic.     Right Ear: External ear normal.     Left Ear: External ear  normal.     Nose: Nose normal.     Mouth/Throat:     Mouth: Mucous membranes are moist.     Pharynx: Oropharynx is clear.  Eyes:     General: No scleral icterus.       Right eye: No discharge.        Left eye: No discharge.     Conjunctiva/sclera: Conjunctivae normal.     Pupils: Pupils are equal, round, and reactive to light.  Neck:     Musculoskeletal: Normal range of motion.  Pulmonary:     Effort: Pulmonary effort is normal. No respiratory distress.     Comments: Speaking in full sentences Musculoskeletal: Normal range of motion.  Skin:    Coloration: Skin is not jaundiced or pale.     Findings: No bruising, erythema, lesion or rash.  Neurological:     Mental Status: She is alert and oriented to person, place, and time. Mental status is at baseline.  Psychiatric:        Mood and Affect: Mood normal.        Behavior: Behavior normal.        Thought Content: Thought content normal.        Judgment: Judgment normal.     Results for orders placed or performed in visit on 01/30/19  CBC With Differential/Platelet  Result Value Ref Range   WBC 6.1 3.4 - 10.8 x10E3/uL   RBC 4.03 3.77 - 5.28 x10E6/uL   Hemoglobin 12.1 11.1 - 15.9 g/dL   Hematocrit 36.2 34.0 - 46.6 %   MCV 90 79 - 97 fL   MCH 30.0 26.6 - 33.0 pg   MCHC 33.4 31.5 - 35.7 g/dL   RDW 13.2 11.7 - 15.4 %   Platelets 204 150 - 450 x10E3/uL   Neutrophils 62 Not Estab. %   Lymphs 30 Not Estab. %   MID 8 Not Estab. %   Neutrophils Absolute 3.8 1.4 - 7.0 x10E3/uL   Lymphocytes Absolute 1.8 0.7 - 3.1 x10E3/uL   MID (Absolute) 0.5 0.1 - 1.6 X10E3/uL  TSH  Result Value Ref Range   TSH 1.560 0.450 - 4.500 uIU/mL  QuantiFERON-TB Gold Plus  Result Value Ref Range   QuantiFERON Incubation Incubation performed.    QuantiFERON Criteria Comment    QuantiFERON TB1 Ag Value 0.07 IU/mL   QuantiFERON TB2 Ag Value 0.04 IU/mL   QuantiFERON Nil Value 0.02 IU/mL   QuantiFERON Mitogen Value >10.00 IU/mL   QuantiFERON-TB Gold  Plus Negative Negative  Estradiol  Result Value Ref Range   Estradiol 376.7 pg/mL  LH  Result Value Ref Range   LH 6.0 mIU/mL  [facility]  Result Value Ref Range   FSH 1.5 mIU/mL  Testosterone,  free, total([facility]/Sunquest)  Result Value Ref Range   Testosterone 9 8 - 48 ng/dL   Testosterone, Free 0.7 0.0 - 4.2 pg/mL   Sex Hormone Binding 78.5 24.6 - 122.0 nmol/L      Assessment & Plan:   Problem List Items Addressed This Visit      Other   Depression - Primary    Not doing well at all. Anxiety and depression are a lot worse. Hydroxyzine and lorazepam make her quite sleepy, so she can't take them at work. Buspar did nothing for her in the past. Will increase her abilify to 5mg  daily and recheck 2 weeks. Encouraged patient to call if getting worse and not to wait 2 weeks. Call with any concerns.           Follow up plan: Return in about 2 weeks (around 03/13/2019) for follow up mood.    . This visit was completed via Skype due to the restrictions of the COVID-19 pandemic. All issues as above were discussed and addressed. Physical exam was done as above through visual confirmation on Skype. If it was felt that the patient should be evaluated in the office, they were directed there. The patient verbally consented to this visit. . Location of the patient: work . Location of the provider: home . Those involved with this call:  . Provider: Park Liter, DO . CMA: Gerda Diss, CMA . Front Desk/Registration: Jill Side  . Time spent on call: 15 minutes with patient face to face via video conference. More than 50% of this time was spent in counseling and coordination of care. 23 minutes total spent in review of patient's record and preparation of their chart.

## 2019-02-28 ENCOUNTER — Telehealth: Payer: Self-pay

## 2019-02-28 NOTE — Telephone Encounter (Signed)
PA for Ariprazole initiated and submitted on Cover My Meds. Key: [id number]

## 2019-03-04 NOTE — Telephone Encounter (Signed)
PA approved.

## 2019-03-07 ENCOUNTER — Encounter: Payer: Self-pay | Admitting: Family Medicine

## 2019-03-07 ENCOUNTER — Ambulatory Visit (HOSPITAL_COMMUNITY): Admission: RE | Admit: 2019-03-07 | Payer: No Typology Code available for payment source | Source: Ambulatory Visit

## 2019-03-07 DIAGNOSIS — F332 Major depressive disorder, recurrent severe without psychotic features: Secondary | ICD-10-CM

## 2019-03-07 DIAGNOSIS — F419 Anxiety disorder, unspecified: Secondary | ICD-10-CM

## 2019-03-10 ENCOUNTER — Telehealth: Payer: Self-pay | Admitting: Family Medicine

## 2019-03-10 NOTE — Telephone Encounter (Signed)
Copied from [place] [id number]. Topic: Quick Communication - See Telephone Encounter >> Mar 10, 2019  3:08 PM Robina Ade, Helene Kelp D wrote: CRM for notification. See Telephone encounter for: 03/10/19. Patient called and would like to know if Dr. Wynetta Emery received paperwork from [facility] for her disability. Please call patient back or send mychart message.

## 2019-03-10 NOTE — Telephone Encounter (Signed)
I have filled out FMLA paperwork but I have not gotten paperwork from [place]. Please have her resend it.

## 2019-03-10 NOTE — Telephone Encounter (Signed)
Patient notified via Voicemail. (DPR Reviewed). Awaiting determination from PA.

## 2019-03-10 NOTE — Telephone Encounter (Signed)
I also have a completed a PA on her Aimovig.  Key: [id number]

## 2019-03-18 ENCOUNTER — Telehealth: Payer: Self-pay

## 2019-03-18 MED ORDER — ERENUMAB-AOOE 140 MG/ML ~~LOC~~ SOAJ: 140.0000 mg | SUBCUTANEOUS | 4 refills | Status: DC

## 2019-03-18 NOTE — Telephone Encounter (Signed)
Copied from [place] [id number]. Topic: Quick Communication - See Telephone Encounter >> Mar 13, 2019  3:57 PM Rayann Heman wrote: CRM for notification. See Telephone encounter for: 03/13/19. Ronalee Belts calling from cover my meds called and stated that he would like a call back regarding patients medication Erenumab-aooe (AIMOVIG) 70 MG/ML SOAJ [[id number]]  KEY [id number] >> Mar 17, 2019  4:30 PM Georgina Peer, CMA wrote: Hulen Skains and spoke with [facility]. He states that the PA for this medication was cancelled for an unknown reason. They needed to know if we were still waiting on a determination from the insurance on this and I told him that we were. He provided me with the number, [phone], to contact the patient's insurance and figure of what is going on.     Called pharmacy at [facility]. PA was submitted and approved for Aimovig 140 mg on 03/13/2019. This was approved from now through April next year. Because RX does not match the PA submitted, either new PA needs to be submitted for the 70 mg or new RX needs to be sent for the 140 mg injections. Please advise.

## 2019-03-20 ENCOUNTER — Other Ambulatory Visit: Payer: Self-pay

## 2019-03-20 ENCOUNTER — Ambulatory Visit (INDEPENDENT_AMBULATORY_CARE_PROVIDER_SITE_OTHER): Payer: No Typology Code available for payment source | Admitting: Family Medicine

## 2019-03-20 ENCOUNTER — Encounter: Payer: Self-pay | Admitting: Family Medicine

## 2019-03-20 DIAGNOSIS — F332 Major depressive disorder, recurrent severe without psychotic features: Secondary | ICD-10-CM | POA: Diagnosis not present

## 2019-03-20 NOTE — Progress Notes (Signed)
There were no vitals taken for this visit.   Subjective:    Patient ID: Ashley Wiley, female    DOB: [birth year], 43 y.o.   MRN: [id number]  HPI: Ashley Wiley is a 43 y.o. female  Chief Complaint  Patient presents with  . Follow-up  . Depression   DEPRESSION- has not been doing well. Has been out of work for the last 3 weeks due to her anxiety and depression and the COVID-19 increasing this. She went to see psychiatry last week. She just had her medications changed. Added the zoloft and decreased her abilify from 2x a 1x and stopped the lorazepam. She is due to see her again on 5/9. Feels like she has had a back slide since then. She feels like she is not ready to go back to work right now. Dr. Nicolasa Ducking has extended her leave of abstance. Mood status: uncontrolled Satisfied with current treatment?: no Symptom severity: severe  Duration of current treatment : chronic Side effects: no Medication compliance: excellent compliance Psychotherapy/counseling: no  Depressed mood: yes Anxious mood: yes Anhedonia: yes Significant weight loss or gain: no Insomnia: yes hard to fall asleep Fatigue: yes Feelings of worthlessness or guilt: yes Impaired concentration/indecisiveness: yes Suicidal ideations: no Hopelessness: yes Crying spells: yes Depression screen [facility] 2/9 03/20/2019 02/27/2019 12/13/2018 09/20/2018 07/25/2018  Decreased Interest 2 3 2 1 2   Down, Depressed, Hopeless 3 3 2 1 2   PHQ - 2 Score 5 6 4 2 4   Altered sleeping 3 3 2 3  0  Tired, decreased energy 3 3 3 2 1   Change in appetite 3 3 3 1 1   Feeling bad or failure about yourself  3 3 2 1 1   Trouble concentrating 3 3 2 3 1   Moving slowly or fidgety/restless 3 3 1 1  0  Suicidal thoughts 0 0 0 0 0  PHQ-9 Score 23 24 17 13 8   Difficult doing work/chores Very difficult Extremely dIfficult Very difficult Somewhat difficult Very difficult  Some recent data might be hidden     Relevant past medical, surgical, family and  social history reviewed and updated as indicated. Interim medical history since our last visit reviewed. Allergies and medications reviewed and updated.  Review of Systems  Constitutional: Negative.   Respiratory: Negative.   Cardiovascular: Negative.   Musculoskeletal: Positive for arthralgias. Negative for back pain, gait problem, joint swelling, myalgias, neck pain and neck stiffness.  Skin: Negative.   Psychiatric/Behavioral: Positive for dysphoric mood and sleep disturbance. Negative for agitation, behavioral problems, confusion, decreased concentration, hallucinations, self-injury and suicidal ideas. The patient is nervous/anxious. The patient is not hyperactive.     Per HPI unless specifically indicated above     Objective:    There were no vitals taken for this visit.  Wt Readings from Last 3 Encounters:  02/27/19 157 lb (71.2 kg)  01/30/19 160 lb (72.6 kg)  12/13/18 153 lb 8 oz (69.6 kg)    Physical Exam Vitals signs and nursing note reviewed.  Constitutional:      General: She is not in acute distress.    Appearance: Normal appearance. She is not ill-appearing, toxic-appearing or diaphoretic.  HENT:     Head: Normocephalic and atraumatic.     Right Ear: External ear normal.     Left Ear: External ear normal.     Nose: Nose normal.     Mouth/Throat:     Mouth: Mucous membranes are moist.     Pharynx: Oropharynx is clear.  Eyes:     General: No scleral icterus.       Right eye: No discharge.        Left eye: No discharge.     Conjunctiva/sclera: Conjunctivae normal.     Pupils: Pupils are equal, round, and reactive to light.  Neck:     Musculoskeletal: Normal range of motion.  Pulmonary:     Effort: Pulmonary effort is normal. No respiratory distress.     Comments: Speaking in full sentences Musculoskeletal: Normal range of motion.  Skin:    Coloration: Skin is not jaundiced or pale.     Findings: No bruising, erythema, lesion or rash.  Neurological:      Mental Status: She is alert and oriented to person, place, and time. Mental status is at baseline.  Psychiatric:        Mood and Affect: Mood normal.        Behavior: Behavior normal.        Thought Content: Thought content normal.        Judgment: Judgment normal.     Results for orders placed or performed in visit on 01/30/19  CBC With Differential/Platelet  Result Value Ref Range   WBC 6.1 3.4 - 10.8 x10E3/uL   RBC 4.03 3.77 - 5.28 x10E6/uL   Hemoglobin 12.1 11.1 - 15.9 g/dL   Hematocrit 36.2 34.0 - 46.6 %   MCV 90 79 - 97 fL   MCH 30.0 26.6 - 33.0 pg   MCHC 33.4 31.5 - 35.7 g/dL   RDW 13.2 11.7 - 15.4 %   Platelets 204 150 - 450 x10E3/uL   Neutrophils 62 Not Estab. %   Lymphs 30 Not Estab. %   MID 8 Not Estab. %   Neutrophils Absolute 3.8 1.4 - 7.0 x10E3/uL   Lymphocytes Absolute 1.8 0.7 - 3.1 x10E3/uL   MID (Absolute) 0.5 0.1 - 1.6 X10E3/uL  TSH  Result Value Ref Range   TSH 1.560 0.450 - 4.500 uIU/mL  QuantiFERON-TB Gold Plus  Result Value Ref Range   QuantiFERON Incubation Incubation performed.    QuantiFERON Criteria Comment    QuantiFERON TB1 Ag Value 0.07 IU/mL   QuantiFERON TB2 Ag Value 0.04 IU/mL   QuantiFERON Nil Value 0.02 IU/mL   QuantiFERON Mitogen Value >10.00 IU/mL   QuantiFERON-TB Gold Plus Negative Negative  Estradiol  Result Value Ref Range   Estradiol 376.7 pg/mL  LH  Result Value Ref Range   LH 6.0 mIU/mL  FSH  Result Value Ref Range   FSH 1.5 mIU/mL  Testosterone, free, total([facility]/Sun[facility])  Result Value Ref Range   Testosterone 9 8 - 48 ng/dL   Testosterone, Free 0.7 0.0 - 4.2 pg/mL   Sex Hormone Binding 78.5 24.6 - 122.0 nmol/L      Assessment & Plan:   Problem List Items Addressed This Visit      Other   Depression - Primary    Working with psychiatry. They have extended her leave of absence. Continue to monitor. Call with any concerns.       Relevant Medications   sertraline (ZOLOFT) 50 MG tablet       Follow up plan:  Return in about 3 months (around 06/19/2019) for Physical.   . This visit was completed via Skype due to the restrictions of the COVID-19 pandemic. All issues as above were discussed and addressed. Physical exam was done as above through visual confirmation on Skype. If it was felt that the patient should be evaluated in the  office, they were directed there. The patient verbally consented to this visit. . Location of the patient: home . Location of the provider: home . Those involved with this call:  . Provider: Park Liter, DO . CMA: Gerda Diss, CMA . Front Desk/Registration: Don Perking  . Time spent on call: 15 minutes with patient face to face via video conference. More than 50% of this time was spent in counseling and coordination of care. 23 minutes total spent in review of patient's record and preparation of their chart.

## 2019-03-20 NOTE — Assessment & Plan Note (Signed)
Working with psychiatry. They have extended her leave of absence. Continue to monitor. Call with any concerns.

## 2019-04-04 ENCOUNTER — Other Ambulatory Visit: Payer: Self-pay | Admitting: Family Medicine

## 2019-04-07 ENCOUNTER — Other Ambulatory Visit: Payer: Self-pay

## 2019-04-07 MED ORDER — METFORMIN HCL ER 500 MG PO TB24: 500.0000 mg | ORAL_TABLET | Freq: Every day | ORAL | 1 refills | Status: DC

## 2019-04-07 MED ORDER — LIRAGLUTIDE -WEIGHT MANAGEMENT 18 MG/3ML ~~LOC~~ SOPN: 3.0000 mg | PEN_INJECTOR | Freq: Every day | SUBCUTANEOUS | 6 refills | Status: DC

## 2019-04-07 NOTE — Telephone Encounter (Signed)
[facility] faxed a Rx refill on saxenda 18 mg/3 ML pen

## 2019-04-07 NOTE — Telephone Encounter (Signed)
[facility] faxed a Rx refill on Metformin HCL ER 500 mg tablets

## 2019-04-09 ENCOUNTER — Other Ambulatory Visit: Payer: Self-pay | Admitting: Neurosurgery

## 2019-04-16 ENCOUNTER — Other Ambulatory Visit: Payer: Self-pay | Admitting: Family Medicine

## 2019-04-16 NOTE — Telephone Encounter (Signed)
Requested medication (s) are due for refill today: Yes  Requested medication (s) are on the active medication list: Yes  Last refill:  3 years ago by historical provider  Future visit scheduled:   Notes to clinic:  Unable to refill, expired     Requested Prescriptions  Pending Prescriptions Disp Refills   ACCU-CHEK GUIDE test strip [Pharmacy Med Name: [place] 100 each 12    Sig: [place]     Endocrinology: Diabetes - Testing Supplies Passed - 04/16/2019  2:57 PM      Passed - Valid encounter within last 12 months    Recent Outpatient Visits          3 weeks ago Severe episode of recurrent major depressive disorder, without psychotic features ([place])   [place], Megan P, DO   1 month ago Severe episode of recurrent major depressive disorder, without psychotic features ([place])   [place], Megan P, DO   2 months ago Night sweats   [employer], Megan P, DO   4 months ago Migraine without aura and without status migrainosus, not intractable   [place], Megan P, DO   5 months ago Acute recurrent maxillary sinusitis   [place], Barb Merino, DO      Future Appointments            In 2 months Wynetta Emery, Barb Merino, DO [employer]

## 2019-04-16 NOTE — Telephone Encounter (Signed)
Copied from [place] [id number]. Topic: Quick Communication - Rx Refill/Question >> Apr 16, 2019  9:58 AM Robina Ade, Helene Kelp D wrote: Medication: ARIPiprazole (ABILIFY) 5 MG tablet, sertraline (ZOLOFT) 50 MG tablet a 90 day supply mail to home, patient is a [employer] employee.  Has the patient contacted their pharmacy? Yes (Agent: If no, request that the patient contact the pharmacy for the refill.) (Agent: If yes, when and what did the pharmacy advise?)  Preferred Pharmacy (with phone number or street name): [place], [place]: Please be advised that RX refills may take up to 3 business days. We ask that you follow-up with your pharmacy.

## 2019-04-16 NOTE — Telephone Encounter (Signed)
She is now seeing Dr. Nicolasa Ducking- does she want me to write her medicine because usually Dr. Nicolasa Ducking does.

## 2019-04-16 NOTE — Telephone Encounter (Signed)
Patient returned my call. Patient states Dr. Nicolasa Ducking is writing medication, she will contact them for refill.

## 2019-04-16 NOTE — Telephone Encounter (Signed)
Called and left patient a VM asking for her to please return my call.  

## 2019-04-17 ENCOUNTER — Other Ambulatory Visit: Payer: Self-pay | Admitting: Family Medicine

## 2019-05-01 NOTE — H&P (Signed)
Chief Complaint   Aneurysm   HPI   HPI: Ashley Wiley is a 43 y.o. female who underwent elective coil embolization of an anterior communicating artery aneurysm April 2019.    She has been doing well and presents today for diagnostic cerebral angiogram for routine monitoring. She is without any concerns.   Patient Active Problem List   Diagnosis Date Noted  . Trigeminal neuralgia 12/13/2018  . Migraine without aura and without status migrainosus, not intractable 04/01/2018  . Cerebral aneurysm, nonruptured 03/05/2018  . Fibroid 02/08/2018  . Menstrual migraine without status migrainosus, not intractable 02/08/2018  . Menorrhagia with regular cycle 02/08/2018  . Acute anxiety 01/13/2016  . History of HPV infection 01/13/2016  . Elevated ALT measurement 10/05/2015  . Allergic rhinitis 07/13/2015  . Thyroid nodule 07/13/2015  . Obesity 05/14/2015  . IFG (impaired fasting glucose)   . Hyperlipidemia   . Patella-femoral syndrome   . Vitamin D deficiency disease   . Dermatofibroma   . Depression   . Pinguecula   . Chronic pansinusitis 11/11/2014    PMH: Past Medical History:  Diagnosis Date  . Allergy   . Aneurysm ([place])   . Anxiety   . Depression   . Dermatofibroma   . Diabetes mellitus without complication ([place])    Takes Metformin  dx 2016  . Facial pain   . GERD (gastroesophageal reflux disease)   . Headache   . Hyperlipidemia   . IFG (impaired fasting glucose)   . Low HDL (under 40)   . Overweight   . Patella-femoral syndrome   . Pinguecula   . Thyroid nodule april 2016  . Vitamin D deficiency disease     PSH: Past Surgical History:  Procedure Laterality Date  . CARPAL TUNNEL RELEASE    . GANGLION CYST EXCISION Left   . IR 3D INDEPENDENT WKST  12/04/2017  . IR ANGIO INTRA EXTRACRAN SEL INTERNAL CAROTID BILAT MOD SED  12/04/2017  . IR ANGIO INTRA EXTRACRAN SEL INTERNAL CAROTID UNI L MOD SED  03/05/2018  . IR ANGIO VERTEBRAL SEL VERTEBRAL BILAT MOD SED   12/04/2017  . IR ANGIOGRAM FOLLOW UP STUDY  03/05/2018  . IR ANGIOGRAM FOLLOW UP STUDY  03/05/2018  . IR ANGIOGRAM FOLLOW UP STUDY  03/05/2018  . IR NEURO EACH ADD'L AFTER BASIC UNI LEFT (MS)  03/05/2018  . IR TRANSCATH/EMBOLIZ  03/05/2018  . NASAL SINUS SURGERY  Nov 2015  . RADIOLOGY WITH ANESTHESIA N/A 03/05/2018   Procedure: Arteriogram, coil embolization, possible stenting;  Surgeon: Consuella Lose, MD;  Location: [facility];  Service: Radiology;  Laterality: N/A;  . TUBAL LIGATION      (Not in a hospital admission)   SH: Social History   Tobacco Use  . Smoking status: Never Smoker  . Smokeless tobacco: Never Used  Substance Use Topics  . Alcohol use: Not Currently    Alcohol/week: 0.0 - 1.0 standard drinks    Comment: on occasion  . Drug use: No    MEDS: Prior to Admission medications   Medication Sig Start Date End Date Taking? Authorizing Provider  acetaminophen (TYLENOL) 500 MG tablet Take 1,000 mg by mouth daily as needed for moderate pain or headache.   Yes [provider]  ARIPiprazole (ABILIFY) 5 MG tablet Take 1 tablet (5 mg total) by mouth 2 (two) times daily. Patient taking differently: Take 5 mg by mouth daily.  02/27/19  Yes Johnson, Megan P, DO  ciclopirox (PENLAC) 8 % solution Apply over  nail and surrounding skin. Apply daily over previous coat. After seven (7) days, may remove with alcohol and continue cycle. Patient taking differently: Apply 1 application topically See admin instructions. Apply over nail and surrounding skin. Apply daily over previous coat. After seven (7) days, may remove with alcohol and continue cycle. As needed 12/13/18  Yes Johnson, Megan P, DO  cyanocobalamin 1000 MCG tablet Take 1,000 mcg by mouth daily.    Yes [provider]  Erenumab-aooe (AIMOVIG) 140 MG/ML SOAJ Inject 140 mg into the skin every 30 (thirty) days. 03/18/19  Yes Johnson, Megan P, DO  fluticasone (FLONASE) 50 MCG/ACT nasal spray Place 2 sprays into both  nostrils daily. Patient taking differently: Place 2 sprays into both nostrils daily as needed for allergies.  01/30/19  Yes Johnson, Megan P, DO  hydrOXYzine (ATARAX/VISTARIL) 25 MG tablet Take 0.5-2 tablets (12.5-50 mg total) by mouth 3 (three) times daily as needed. 09/20/18  Yes Johnson, Megan P, DO  ibuprofen (ADVIL,MOTRIN) 200 MG tablet Take 800 mg by mouth daily as needed for headache or moderate pain.   Yes [provider]  Liraglutide -Weight Management (SAXENDA) 18 MG/3ML SOPN Inject 3 mg into the skin daily. 04/07/19  Yes Johnson, Megan P, DO  loratadine (CLARITIN) 10 MG tablet Take 10 mg by mouth daily as needed for allergies.    Yes [provider]  metFORMIN (GLUCOPHAGE-XR) 500 MG 24 hr tablet Take 1 tablet (500 mg total) by mouth daily with breakfast. 04/07/19  Yes Johnson, Megan P, DO  naproxen (NAPROSYN) 500 MG tablet Take 1 tablet (500 mg total) by mouth 2 (two) times daily with a meal. Patient taking differently: Take 500 mg by mouth 2 (two) times daily as needed (pain).  09/20/18  Yes Johnson, Megan P, DO  nortriptyline (PAMELOR) 50 MG capsule Take 1 capsule (50 mg total) by mouth at bedtime. 12/13/18  Yes Johnson, Megan P, DO  propranolol ER (INDERAL LA) 80 MG 24 hr capsule Take 1 capsule (80 mg total) by mouth daily. 09/20/18  Yes Johnson, Megan P, DO  sertraline (ZOLOFT) 100 MG tablet Take 150 mg by mouth every morning.  03/13/19  Yes [provider]  traZODone (DESYREL) 50 MG tablet Take 50 mg by mouth at bedtime as needed for sleep. 04/11/19  Yes [provider]  venlafaxine XR (EFFEXOR XR) 75 MG 24 hr capsule Take 1 capsule (75 mg total) by mouth daily with breakfast. 09/20/18  Yes Johnson, Megan P, DO  venlafaxine XR (EFFEXOR-XR) 150 MG 24 hr capsule Take 1 capsule (150 mg total) by mouth daily with breakfast. 09/20/18  Yes Johnson, Megan P, DO  Vitamin D, Ergocalciferol, (DRISDOL) 1.25 MG (50000 UT) CAPS capsule Take 1 capsule (50,000 Units total) by  mouth every 7 (seven) days. 02/27/19  Yes Johnson, Megan P, DO  ACCU-CHEK GUIDE test strip CHECK BLOOD SUGAR DAILY 04/17/19   Wynetta Emery, Megan P, DO  UNIFINE PENTIPS 31G X 5 MM MISC USE AS DIRECTED WITH SAXENDA 12/24/18   Park Liter P, DO    ALLERGY: Allergies  Allergen Reactions  . Imitrex [Sumatriptan] Other (See Comments)    Chest pain   . Wellbutrin [Bupropion] Other (See Comments)    Increased anxiety and tremors    Social History   Tobacco Use  . Smoking status: Never Smoker  . Smokeless tobacco: Never Used  Substance Use Topics  . Alcohol use: Not Currently    Alcohol/week: 0.0 - 1.0 standard drinks    Comment: on  occasion     Family History  Problem Relation Age of Onset  . Diabetes Father   . Hypertension Father   . Heart disease Father   . Kidney disease Father   . Stroke Father   . Aneurysm Father   . Diabetes Paternal Grandfather      ROS   ROS  Exam   There were no vitals filed for this visit. General appearance: WDWN, NAD Eyes: No scleral injection Cardiovascular: Regular rate and rhythm without murmurs, rubs, gallops. No edema or variciosities. Distal pulses normal. Pulmonary: Effort normal, non-labored breathing Musculoskeletal:     Muscle tone upper extremities: Normal    Muscle tone lower extremities: Normal    Motor exam: Upper Extremities Deltoid Bicep Tricep Grip  Right 5/5 5/5 5/5 5/5  Left 5/5 5/5 5/5 5/5   Lower Extremity IP Quad PF DF EHL  Right 5/5 5/5 5/5 5/5 5/5  Left 5/5 5/5 5/5 5/5 5/5   Neurological Mental Status:    - Patient is awake, alert, oriented to person, place, month, year, and situation    - Patient is able to give a clear and coherent history.    - No signs of aphasia or neglect Cranial Nerves    - II: Visual Fields are full. PERRL    - III/IV/VI: EOMI without ptosis or diploplia.     - V: Facial sensation is grossly normal    - VII: Facial movement is symmetric.     - VIII: hearing is intact to voice    -  X: Uvula elevates symmetrically    - XI: Shoulder shrug is symmetric.    - XII: tongue is midline without atrophy or fasciculations.  Sensory: Sensation grossly intact to LT  Results - Imaging/Labs   No results found for this or any previous visit (from the past 48 hour(s)).  No results found.  IMAGING: No new imaging  Impression/Plan   43 y.o. female approx 14 months status post elective coil embolization of incidentally discovered anterior communicating artery aneurysm.  She is neurologically well.   We will proceed with diagnostic cerebral angiogram for routine monitoring.  While in the office risks, benefits and alternatives to procedure were discussed.  Patient stated in on language understanding which proceed.    Ferne Reus, PA-C [facility] [employer]

## 2019-05-02 ENCOUNTER — Ambulatory Visit (HOSPITAL_COMMUNITY)
Admission: RE | Admit: 2019-05-02 | Discharge: 2019-05-02 | Disposition: A | Discharge status: Home / Self Care | Payer: No Typology Code available for payment source | Source: Ambulatory Visit | Attending: Neurosurgery | Admitting: Neurosurgery

## 2019-05-02 ENCOUNTER — Other Ambulatory Visit (HOSPITAL_COMMUNITY): Payer: Self-pay | Admitting: Neurosurgery

## 2019-05-02 ENCOUNTER — Other Ambulatory Visit: Payer: Self-pay

## 2019-05-02 ENCOUNTER — Encounter (HOSPITAL_COMMUNITY): Payer: Self-pay

## 2019-05-02 DIAGNOSIS — E119 Type 2 diabetes mellitus without complications: Secondary | ICD-10-CM | POA: Diagnosis not present

## 2019-05-02 DIAGNOSIS — F329 Major depressive disorder, single episode, unspecified: Secondary | ICD-10-CM | POA: Diagnosis not present

## 2019-05-02 DIAGNOSIS — E559 Vitamin D deficiency, unspecified: Secondary | ICD-10-CM | POA: Diagnosis not present

## 2019-05-02 DIAGNOSIS — E785 Hyperlipidemia, unspecified: Secondary | ICD-10-CM | POA: Insufficient documentation

## 2019-05-02 DIAGNOSIS — I671 Cerebral aneurysm, nonruptured: Secondary | ICD-10-CM | POA: Diagnosis not present

## 2019-05-02 DIAGNOSIS — F419 Anxiety disorder, unspecified: Secondary | ICD-10-CM | POA: Diagnosis not present

## 2019-05-02 DIAGNOSIS — Z79899 Other long term (current) drug therapy: Secondary | ICD-10-CM | POA: Insufficient documentation

## 2019-05-02 DIAGNOSIS — Z7984 Long term (current) use of oral hypoglycemic drugs: Secondary | ICD-10-CM | POA: Diagnosis not present

## 2019-05-02 DIAGNOSIS — K219 Gastro-esophageal reflux disease without esophagitis: Secondary | ICD-10-CM | POA: Insufficient documentation

## 2019-05-02 HISTORY — PX: IR ANGIO INTRA EXTRACRAN SEL INTERNAL CAROTID UNI L MOD SED: IMG5361

## 2019-05-02 LAB — CBC WITH DIFFERENTIAL/PLATELET
Abs Immature Granulocytes: 0.08 10*3/uL — ABNORMAL HIGH (ref 0.00–0.07)
Basophils Absolute: 0 10*3/uL (ref 0.0–0.1)
Basophils Relative: 1 %
Eosinophils Absolute: 0.3 10*3/uL (ref 0.0–0.5)
Eosinophils Relative: 4 %
HCT: 34.5 % — ABNORMAL LOW (ref 36.0–46.0)
Hemoglobin: 11.5 g/dL — ABNORMAL LOW (ref 12.0–15.0)
Immature Granulocytes: 1 %
Lymphocytes Relative: 24 %
Lymphs Abs: 1.9 10*3/uL (ref 0.7–4.0)
MCH: 30.7 pg (ref 26.0–34.0)
MCHC: 33.3 g/dL (ref 30.0–36.0)
MCV: 92 fL (ref 80.0–100.0)
Monocytes Absolute: 0.7 10*3/uL (ref 0.1–1.0)
Monocytes Relative: 8 %
Neutro Abs: 5 10*3/uL (ref 1.7–7.7)
Neutrophils Relative %: 62 %
Platelets: 197 10*3/uL (ref 150–400)
RBC: 3.75 MIL/uL — ABNORMAL LOW (ref 3.87–5.11)
RDW: 12.9 % (ref 11.5–15.5)
WBC: 8 10*3/uL (ref 4.0–10.5)
nRBC: 0 % (ref 0.0–0.2)

## 2019-05-02 LAB — BASIC METABOLIC PANEL
Anion gap: 9 (ref 5–15)
BUN: 10 mg/dL (ref 6–20)
CO2: 26 mmol/L (ref 22–32)
Calcium: 9.1 mg/dL (ref 8.9–10.3)
Chloride: 101 mmol/L (ref 98–111)
Creatinine, Ser: 0.65 mg/dL (ref 0.44–1.00)
GFR calc Af Amer: >60 mL/min (ref >60)
GFR calc non Af Amer: >60 mL/min (ref >60)
Glucose, Bld: 128 mg/dL — ABNORMAL HIGH (ref 70–99)
Potassium: 4.2 mmol/L (ref 3.5–5.1)
Sodium: 136 mmol/L (ref 135–145)

## 2019-05-02 LAB — APTT: aPTT: 30 seconds (ref 24–36)

## 2019-05-02 LAB — PROTIME-INR
INR: 1 (ref 0.8–1.2)
Prothrombin Time: 12.5 seconds (ref 11.4–15.2)

## 2019-05-02 MED ORDER — NITROGLYCERIN 1 MG/10 ML FOR IR/CATH LAB
INTRA_ARTERIAL | Status: AC
  Filled 2019-05-02: qty 10

## 2019-05-02 MED ORDER — MIDAZOLAM HCL 2 MG/2ML IJ SOLN
INTRAMUSCULAR | Status: AC
  Filled 2019-05-02: qty 2

## 2019-05-02 MED ORDER — FENTANYL CITRATE (PF) 100 MCG/2ML IJ SOLN
INTRAMUSCULAR | Status: AC | PRN
  Administered 2019-05-02: 25 ug via INTRAVENOUS

## 2019-05-02 MED ORDER — FENTANYL CITRATE (PF) 100 MCG/2ML IJ SOLN
INTRAMUSCULAR | Status: AC
  Filled 2019-05-02: qty 2

## 2019-05-02 MED ORDER — HEPARIN SODIUM (PORCINE) 1000 UNIT/ML IJ SOLN
INTRAMUSCULAR | Status: AC
  Filled 2019-05-02: qty 1

## 2019-05-02 MED ORDER — SODIUM CHLORIDE 0.9 % IV SOLN: INTRAVENOUS | Status: DC

## 2019-05-02 MED ORDER — IOHEXOL 300 MG/ML  SOLN
150.0000 mL | Freq: Once | INTRAMUSCULAR | Status: AC | PRN
  Administered 2019-05-02: 20 mL via INTRA_ARTERIAL

## 2019-05-02 MED ORDER — HEPARIN SODIUM (PORCINE) 1000 UNIT/ML IJ SOLN
INTRAMUSCULAR | Status: AC | PRN
  Administered 2019-05-02: 2000 [IU] via INTRAVENOUS

## 2019-05-02 MED ORDER — VERAPAMIL HCL 2.5 MG/ML IV SOLN
INTRAVENOUS | Status: AC
  Filled 2019-05-02: qty 2

## 2019-05-02 MED ORDER — HYDROCODONE-ACETAMINOPHEN 5-325 MG PO TABS: 1.0000 | ORAL_TABLET | ORAL | Status: DC | PRN

## 2019-05-02 MED ORDER — LIDOCAINE HCL 1 % IJ SOLN
INTRAMUSCULAR | Status: AC | PRN
  Administered 2019-05-02: 10 mL

## 2019-05-02 MED ORDER — LIDOCAINE HCL 1 % IJ SOLN
INTRAMUSCULAR | Status: AC
  Filled 2019-05-02: qty 20

## 2019-05-02 MED ORDER — MIDAZOLAM HCL 2 MG/2ML IJ SOLN
INTRAMUSCULAR | Status: AC | PRN
  Administered 2019-05-02: 1 mg via INTRAVENOUS

## 2019-05-02 NOTE — Discharge Instructions (Signed)
Angiogram, Care After °This sheet gives you information about how to care for yourself after your procedure. Your health care provider may also give you more specific instructions. If you have problems or questions, contact your health care provider. °What can I expect after the procedure? °After the procedure, it is common to have bruising and tenderness at the catheter insertion area. °Follow these instructions at home: °Insertion site care °· Follow instructions from your health care provider about how to take care of your insertion site. Make sure you: °? Wash your hands with soap and water before you change your bandage (dressing). If soap and water are not available, use hand sanitizer. °? Change your dressing as told by your health care provider. °? Leave stitches (sutures), skin glue, or adhesive strips in place. These skin closures may need to stay in place for 2 weeks or longer. If adhesive strip edges start to loosen and curl up, you may trim the loose edges. Do not remove adhesive strips completely unless your health care provider tells you to do that. °· Do not take baths, swim, or use a hot tub until your health care provider approves. °· You may shower 24-48 hours after the procedure or as told by your health care provider. °? Gently wash the site with plain soap and water. °? Pat the area dry with a clean towel. °? Do not rub the site. This may cause bleeding. °· Do not apply powder or lotion to the site. Keep the site clean and dry. °· Check your insertion site every day for signs of infection. Check for: °? Redness, swelling, or pain. °? Fluid or blood. °? Warmth. °? Pus or a bad smell. °Activity °· Rest as told by your health care provider, usually for 1-2 days. °· Do not lift anything that is heavier than 10 lbs. (4.5 kg) or as told by your health care provider. °· Do not drive for 24 hours if you were given a medicine to help you relax (sedative). °· Do not drive or use heavy machinery while  taking prescription pain medicine. °General instructions ° °· Return to your normal activities as told by your health care provider, usually in about a week. Ask your health care provider what activities are safe for you. °· If the catheter site starts bleeding, lie flat and put pressure on the site. If the bleeding does not stop, get help right away. This is a medical emergency. °· Drink enough fluid to keep your urine clear or pale yellow. This helps flush the contrast dye from your body. °· Take over-the-counter and prescription medicines only as told by your health care provider. °· Keep all follow-up visits as told by your health care provider. This is important. °Contact a health care provider if: °· You have a fever or chills. °· You have redness, swelling, or pain around your insertion site. °· You have fluid or blood coming from your insertion site. °· The insertion site feels warm to the touch. °· You have pus or a bad smell coming from your insertion site. °· You have bruising around the insertion site. °· You notice blood collecting in the tissue around the catheter site (hematoma). The hematoma may be painful to the touch. °Get help right away if: °· You have severe pain at the catheter insertion area. °· The catheter insertion area swells very fast. °· The catheter insertion area is bleeding, and the bleeding does not stop when you hold steady pressure on the area. °·   The area near or just beyond the catheter insertion site becomes pale, cool, tingly, or numb. These symptoms may represent a serious problem that is an emergency. Do not wait to see if the symptoms will go away. Get medical help right away. Call your local emergency services (911 in the [place] Do not drive yourself to the hospital. Summary  After the procedure, it is common to have bruising and tenderness at the catheter insertion area.  After the procedure, it is important to rest and drink plenty of fluids.  Do not take baths,  swim, or use a hot tub until your health care provider says it is okay to do so. You may shower 24-48 hours after the procedure or as told by your health care provider.  If the catheter site starts bleeding, lie flat and put pressure on the site. If the bleeding does not stop, get help right away. This is a medical emergency. This information is not intended to replace advice given to you by your health care provider. Make sure you discuss any questions you have with your health care provider. Document Released: 05/25/2005 Document Revised: 10/11/2016 Document Reviewed: 10/11/2016 Elsevier Interactive Patient Education  2019 [employer] Cerebral Angiogram  A cerebral angiogram is a procedure that is used to examine the blood vessels in the brain and neck. In this procedure, contrast dye is injected through a long, thin tube (catheter) into an artery. X-rays are then taken, which show if there is a blockage or problem in a blood vessel. Tell a health care provider about:  Any allergies you have, including allergies to shellfish, contrast dye, or iodine  All medicines you are taking, including vitamins, herbs, eye drops, creams, and over-the-counter medicines.  Any problems you or family members have had with anesthetic medicines.  Any blood disorders you have.  Any surgeries you have had.  Any medical conditions you have.  Whether you are pregnant or may be pregnant.  Whether you are currently breastfeeding. What are the risks? Generally, this is a safe procedure. However, problems may occur, including:  Damage to surrounding nerves, tissues, or structures.  Blood clot.  Inability to remember what happened (amnesia). This is usually temporary.  Weakness, numbness, speech, or vision problems. This is usually temporary.  Stroke.  Kidney injury.  Bleeding or bruising.  Allergic reaction medicines or dyes.  Infection. What happens before the procedure? Staying  hydrated Follow instructions from your health care provider about hydration, which may include:  Up to 2 hours before the procedure - you may continue to drink clear liquids, such as water, clear fruit juice, black coffee, and plain tea. Eating and drinking restrictions Follow instructions from your health care provider about eating and drinking, which may include:  8 hours before the procedure - stop eating heavy meals or foods such as meat, fried foods, or fatty foods.  6 hours before the procedure - stop eating light meals or foods, such as toast or cereal.  6 hours before the procedure - stop drinking milk or drinks that contain milk.  2 hours before the procedure - stop drinking clear liquids. General instructions  Ask your health care provider about: ? Changing or stopping your regular medicines. This is especially important if you are taking diabetes medicines or blood thinners. ? Taking medicines such as aspirin or ibuprofen. These medicines can thin your blood. Do not take these medicines before your procedure if your health care provider asks you not to.  You may have  blood tests done.  Plan to have someone take you home from the hospital or clinic.  If you will be going home right after the procedure, plan to have someone with you for 24 hours. What happens during the procedure?  To reduce your risk of infection: ? Your health care team will wash or sanitize their hands. ? Your skin will be washed with soap. ? Hair may be removed from the surgical area.  You will lie on your back on an imaging bed with an X-ray machine around you.  Your head will be secured to the bed with a strap or device to help you keep still.  An IV tube will be inserted into one of your veins.  You will be given one or more of the following: ? A medicine to help you relax (sedative). ? A medicine to numb the area (local anesthetic) where the catheter will be inserted. This is usually your  groin, leg, or arm.  Your heart rate and other vital signs will be watched carefully. You may have electrodes placed on your chest.  A small cut (incision) will be made where the catheter will be inserted.  The catheter will be inserted into an artery that leads to the head. You may feel slight pressure.  The catheter will be moved through the body up to your neck and brain. X-ray images will help your health care provider bring the catheter to the correct location.  The dye will be injected into the catheter and will travel to your head or neck area. You may feel a warming or burning sensation or notice a strange taste in your mouth as the dye goes through your system.  Images will be taken to show how the dye flows through the area.  While the images are being taken, you may be given instructions on breathing, swallowing, moving, or talking.  When the images are finished, the catheter will be slowly removed.  Pressure will be applied to the skin to stop any bleeding. A tight bandage (dressing) or seal will be applied to the skin.  Your IV will be removed. The procedure may vary among health care providers and hospitals. What happens after the procedure?  Your blood pressure, heart rate, breathing rate, and blood oxygen level will be monitored until the medicines you were given have worn off.  You will be asked to lie flat for several hours. The arm or leg where the catheter was inserted will need to be kept straight while you are in the recovery room.  The insertion site will be watched for bleeding and you will be checked often.  You will be instructed to drink plenty of fluids. This will help wash the contrast dye out of your system.  Do not drive for 24 hours if you received a sedative.  It is up to you to get the results of your procedure. Ask your health care provider, or the department that is doing the procedure, when your results will be ready. Summary  A cerebral  angiogram is a procedure that is used to examine the blood vessels in the brain and neck.  In this procedure, contrast dye is injected through a long, thin tube (catheter) into an artery. X-rays are then taken, which show if there is a blockage or problem in a blood vessel.  You will be given a sedative to help you relax during the procedure. A local anesthetic will be used to numb the area where the catheter  is inserted. You may feel pressure when the catheter is inserted, and you may feel a warm sensation when the dye is injected.  After the procedure, you will be asked to lie flat for several hours. The arm or leg where the catheter was inserted will need to be kept straight while you are in the recovery room. This information is not intended to replace advice given to you by your health care provider. Make sure you discuss any questions you have with your health care provider. Document Released: 03/23/2014 Document Revised: 12/11/2016 Document Reviewed: 12/11/2016 Elsevier Interactive Patient Education  2019 [place]. Moderate Conscious Sedation, Adult, Care After These instructions provide you with information about caring for yourself after your procedure. Your health care provider may also give you more specific instructions. Your treatment has been planned according to current medical practices, but problems sometimes occur. Call your health care provider if you have any problems or questions after your procedure. What can I expect after the procedure? After your procedure, it is common:  To feel sleepy for several hours.  To feel clumsy and have poor balance for several hours.  To have poor judgment for several hours.  To vomit if you eat too soon. Follow these instructions at home: For at least 24 hours after the procedure:   Do not: ? Participate in activities where you could fall or become injured. ? Drive. ? Use heavy machinery. ? Drink alcohol. ? Take sleeping pills  or medicines that cause drowsiness. ? Make important decisions or sign legal documents. ? Take care of children on your own.  Rest. Eating and drinking  Follow the diet recommended by your health care provider.  If you vomit: ? Drink water, juice, or soup when you can drink without vomiting. ? Make sure you have little or no nausea before eating solid foods. General instructions  Have a responsible adult stay with you until you are awake and alert.  Take over-the-counter and prescription medicines only as told by your health care provider.  If you smoke, do not smoke without supervision.  Keep all follow-up visits as told by your health care provider. This is important. Contact a health care provider if:  You keep feeling nauseous or you keep vomiting.  You feel light-headed.  You develop a rash.  You have a fever. Get help right away if:  You have trouble breathing. This information is not intended to replace advice given to you by your health care provider. Make sure you discuss any questions you have with your health care provider. Document Released: 08/27/2013 Document Revised: 04/10/2016 Document Reviewed: 02/26/2016 Elsevier Interactive Patient Education  2019 [employer].

## 2019-05-02 NOTE — Brief Op Note (Signed)
  NEUROSURGERY BRIEF OPERATIVE NOTE   PREOP DX: Unruptured cerebral aneurysm  POSTOP DX: Same  PROCEDURE: Diagnostic cerebral angiogram  SURGEON: Dr. Consuella Lose, MD  ANESTHESIA: IV Sedation with Local  EBL: Minimal  SPECIMENS: None  COMPLICATIONS: None  CONDITION: Stable to recovery  FINDINGS (Full report in [facility]): 1. Persistent occlusion of Acom aneurysm 45yr s/p coiling

## 2019-05-02 NOTE — Progress Notes (Signed)
Pt was told to hold metformin restart monday

## 2019-05-20 ENCOUNTER — Other Ambulatory Visit: Payer: Self-pay | Admitting: Family Medicine

## 2019-06-09 ENCOUNTER — Ambulatory Visit (INDEPENDENT_AMBULATORY_CARE_PROVIDER_SITE_OTHER): Payer: No Typology Code available for payment source | Admitting: Family Medicine

## 2019-06-09 ENCOUNTER — Other Ambulatory Visit: Payer: Self-pay

## 2019-06-09 ENCOUNTER — Encounter: Payer: Self-pay | Admitting: Family Medicine

## 2019-06-09 DIAGNOSIS — U071 COVID-19: Secondary | ICD-10-CM | POA: Diagnosis not present

## 2019-06-09 MED ORDER — BENZONATATE 200 MG PO CAPS: 200.0000 mg | ORAL_CAPSULE | Freq: Three times a day (TID) | ORAL | 0 refills | Status: DC | PRN

## 2019-06-09 NOTE — Progress Notes (Signed)
There were no vitals taken for this visit.   Subjective:    Patient ID: Ashley Wiley, female    DOB: 07/24/1976, 43 y.o.   MRN: [id number]  HPI: Ashley Wiley is a 43 y.o. female  Chief Complaint  Patient presents with  . covid 19    Patient states that she is feeling better, she still has a productive cough and chest congestion. No fever    . This visit was completed via WebEx due to the restrictions of the COVID-19 pandemic. All issues as above were discussed and addressed. Physical exam was done as above through visual confirmation on WebEx. If it was felt that the patient should be evaluated in the office, they were directed there. The patient verbally consented to this visit. . Location of the patient: home . Location of the provider: home . Those involved with this call:  . Provider: Merrie Roof, PA-C . CMA: Gerda Diss, CMA . Front Desk/Registration: Jill Side  . Time spent on call: 15 minutes with patient face to face via video conference. More than 50% of this time was spent in counseling and coordination of care. 5 minutes total spent in review of patient's record and preparation of their chart. I verified patient identity using two factors (patient name and date of birth). Patient consents verbally to being seen via telemedicine visit today.   Started coughing about 5 days ago, previously had some headaches but always has those so didn't pay attention to that. Got tested that day and was told she was positive Friday. Has had a sore throat, congestion, some nausea but those have mostly resolved. Productive cough is only thing lingering. Taking delsym prn with minimal benefit. Denies CP, SOB, wheezing, fevers. Husband also positive for the virus and they have been quarantining together since onset.   Relevant past medical, surgical, family and social history reviewed and updated as indicated. Interim medical history since our last visit reviewed. Allergies and  medications reviewed and updated.  Review of Systems  Per HPI unless specifically indicated above     Objective:    There were no vitals taken for this visit.  Wt Readings from Last 3 Encounters:  05/02/19 160 lb (72.6 kg)  02/27/19 157 lb (71.2 kg)  01/30/19 160 lb (72.6 kg)    Physical Exam Vitals signs and nursing note reviewed.  Constitutional:      General: She is not in acute distress.    Appearance: Normal appearance.  HENT:     Head: Atraumatic.     Right Ear: External ear normal.     Left Ear: External ear normal.     Nose: Nose normal. No congestion.     Mouth/Throat:     Mouth: Mucous membranes are moist.     Pharynx: Oropharynx is clear. Posterior oropharyngeal erythema present.  Eyes:     Extraocular Movements: Extraocular movements intact.     Conjunctiva/sclera: Conjunctivae normal.  Neck:     Musculoskeletal: Normal range of motion.  Cardiovascular:     Comments: Unable to assess via virtual visit Pulmonary:     Effort: Pulmonary effort is normal. No respiratory distress.  Musculoskeletal: Normal range of motion.  Skin:    General: Skin is dry.     Findings: No erythema.  Neurological:     Mental Status: She is alert and oriented to person, place, and time.  Psychiatric:        Mood and Affect: Mood normal.  Thought Content: Thought content normal.        Judgment: Judgment normal.     Results for orders placed or performed during the hospital encounter of 05/02/19  APTT  Result Value Ref Range   aPTT 30 24 - 36 seconds  Basic metabolic panel  Result Value Ref Range   Sodium 136 135 - 145 mmol/L   Potassium 4.2 3.5 - 5.1 mmol/L   Chloride 101 98 - 111 mmol/L   CO2 26 22 - 32 mmol/L   Glucose, Bld 128 (H) 70 - 99 mg/dL   BUN 10 6 - 20 mg/dL   Creatinine, Ser 0.65 0.44 - 1.00 mg/dL   Calcium 9.1 8.9 - 10.3 mg/dL   GFR calc non Af Amer >60 >60 mL/min   GFR calc Af Amer >60 >60 mL/min   Anion gap 9 5 - 15  CBC WITH DIFFERENTIAL   Result Value Ref Range   WBC 8.0 4.0 - 10.5 K/uL   RBC 3.75 (L) 3.87 - 5.11 MIL/uL   Hemoglobin 11.5 (L) 12.0 - 15.0 g/dL   HCT 34.5 (L) 36.0 - 46.0 %   MCV 92.0 80.0 - 100.0 fL   MCH 30.7 26.0 - 34.0 pg   MCHC 33.3 30.0 - 36.0 g/dL   RDW 12.9 11.5 - 15.5 %   Platelets 197 150 - 400 K/uL   nRBC 0.0 0.0 - 0.2 %   Neutrophils Relative % 62 %   Neutro Abs 5.0 1.7 - 7.7 K/uL   Lymphocytes Relative 24 %   Lymphs Abs 1.9 0.7 - 4.0 K/uL   Monocytes Relative 8 %   Monocytes Absolute 0.7 0.1 - 1.0 K/uL   Eosinophils Relative 4 %   Eosinophils Absolute 0.3 0.0 - 0.5 K/uL   Basophils Relative 1 %   Basophils Absolute 0.0 0.0 - 0.1 K/uL   Immature Granulocytes 1 %   Abs Immature Granulocytes 0.08 (H) 0.00 - 0.07 K/uL  Protime-INR  Result Value Ref Range   Prothrombin Time 12.5 11.4 - 15.2 seconds   INR 1.0 0.8 - 1.2      Assessment & Plan:   Problem List Items Addressed This Visit    None    Visit Diagnoses    COVID-19 virus infection    -  Primary   Sxs appear to be resolving, tx with mucinex and tessalon perles for lingering hacking cough. F/u in several days if no better or if worsening at any time       Follow up plan: Return if symptoms worsen or fail to improve.

## 2019-06-27 ENCOUNTER — Ambulatory Visit (INDEPENDENT_AMBULATORY_CARE_PROVIDER_SITE_OTHER): Payer: No Typology Code available for payment source | Admitting: Family Medicine

## 2019-06-27 ENCOUNTER — Other Ambulatory Visit: Payer: Self-pay

## 2019-06-27 ENCOUNTER — Encounter: Payer: Self-pay | Admitting: Family Medicine

## 2019-06-27 VITALS — BP 125/83 | HR 93 | Temp 99.0°F | Ht 59.0 in | Wt 165.0 lb

## 2019-06-27 DIAGNOSIS — Z0000 Encounter for general adult medical examination without abnormal findings: Secondary | ICD-10-CM

## 2019-06-27 DIAGNOSIS — E782 Mixed hyperlipidemia: Secondary | ICD-10-CM

## 2019-06-27 DIAGNOSIS — F332 Major depressive disorder, recurrent severe without psychotic features: Secondary | ICD-10-CM

## 2019-06-27 DIAGNOSIS — G500 Trigeminal neuralgia: Secondary | ICD-10-CM

## 2019-06-27 DIAGNOSIS — E041 Nontoxic single thyroid nodule: Secondary | ICD-10-CM

## 2019-06-27 DIAGNOSIS — L2489 Irritant contact dermatitis due to other agents: Secondary | ICD-10-CM | POA: Diagnosis not present

## 2019-06-27 DIAGNOSIS — R7301 Impaired fasting glucose: Secondary | ICD-10-CM | POA: Diagnosis not present

## 2019-06-27 DIAGNOSIS — E559 Vitamin D deficiency, unspecified: Secondary | ICD-10-CM

## 2019-06-27 DIAGNOSIS — Z1239 Encounter for other screening for malignant neoplasm of breast: Secondary | ICD-10-CM

## 2019-06-27 LAB — UA/M W/RFLX CULTURE, ROUTINE
Bilirubin, UA: NEGATIVE
Glucose, UA: NEGATIVE
Ketones, UA: NEGATIVE
Leukocytes,UA: NEGATIVE
Nitrite, UA: NEGATIVE
Protein,UA: NEGATIVE
RBC, UA: NEGATIVE
Specific Gravity, UA: 1.015 (ref 1.005–1.030)
Urobilinogen, Ur: 0.2 mg/dL (ref 0.2–1.0)
pH, UA: 7.5 (ref 5.0–7.5)

## 2019-06-27 LAB — MICROALBUMIN, URINE WAIVED
Creatinine, Urine Waived: 10 mg/dL (ref 10–300)
Microalb, Ur Waived: 10 mg/L (ref 0–19)

## 2019-06-27 LAB — BAYER DCA HB A1C WAIVED: HB A1C (BAYER DCA - WAIVED): 5.9 % (ref <7.0)

## 2019-06-27 MED ORDER — NORTRIPTYLINE HCL 50 MG PO CAPS: 50.0000 mg | ORAL_CAPSULE | Freq: Every day | ORAL | 1 refills | Status: DC

## 2019-06-27 MED ORDER — METFORMIN HCL ER 500 MG PO TB24: 500.0000 mg | ORAL_TABLET | Freq: Every day | ORAL | 1 refills | Status: DC

## 2019-06-27 MED ORDER — FLUTICASONE PROPIONATE 50 MCG/ACT NA SUSP: 2.0000 | Freq: Every day | NASAL | 12 refills | Status: DC | PRN

## 2019-06-27 MED ORDER — PROPRANOLOL HCL ER 80 MG PO CP24: 80.0000 mg | ORAL_CAPSULE | Freq: Every day | ORAL | 1 refills | Status: DC

## 2019-06-27 MED ORDER — SAXENDA 18 MG/3ML ~~LOC~~ SOPN: 3.0000 mg | PEN_INJECTOR | Freq: Every day | SUBCUTANEOUS | 6 refills | Status: DC

## 2019-06-27 MED ORDER — HYDROCORTISONE 2 % EX LOTN: 1.0000 "application " | TOPICAL_LOTION | Freq: Two times a day (BID) | CUTANEOUS | 1 refills | Status: DC

## 2019-06-27 NOTE — Assessment & Plan Note (Signed)
Rechecking levels today. Will treat as needed. Call with any concerns.  

## 2019-06-27 NOTE — Assessment & Plan Note (Signed)
Continuing to follow with psychiatry. Call with any concerns. Continue to monitor.

## 2019-06-27 NOTE — Progress Notes (Signed)
BP 125/83   Pulse 93   Temp 99 F (37.2 C) (Oral)   Ht 4\' 11"  (1.499 m)   Wt 165 lb (74.8 kg)   SpO2 98%   BMI 33.33 kg/m    Subjective:    Patient ID: Ashley Wiley, female    DOB: [birth year], 43 y.o.   MRN: [id number]  HPI: Ashley Wiley is a 43 y.o. female presenting on 06/27/2019 for comprehensive medical examination. Current medical complaints include:  Depression- seeing psychiatry. Doing well with them. Feeling better.   RASH Duration:  Couple of weeks  Location: face  Itching: yes Burning: no Redness: yes Oozing: no Scaling: no Blisters: yes Painful: no Fevers: no Change in detergents/soaps/personal care products: masks at work Recent illness: yes Recent travel:no History of same: no Context: worse Alleviating factors: nothing Treatments attempted:nothing Shortness of breath: no  Throat/tongue swelling: no Myalgias/arthralgias: no  Impaired Fasting Glucose HbA1C:  Lab Results  Component Value Date   HGBA1C 5.0 09/20/2018   Duration of elevated blood sugar: chronic Polydipsia: no Polyuria: no Weight change: yes Visual disturbance: no Glucose Monitoring: yes Diabetic Education: Not Completed Family history of diabetes: yes  HYPERLIPIDEMIA Hyperlipidemia status: excellent compliance Satisfied with current treatment?  no Side effects:  no Medication compliance: not on anything Past cholesterol meds: none Supplements: none Aspirin:  no The 10-year ASCVD risk score Mikey Bussing DC Jr., et al., 2013) is: 1%   Values used to calculate the score:     Age: 75 years     Sex: Female     Is Non-Hispanic African American: No     Diabetic: No     Tobacco smoker: No     Systolic Blood Pressure: 150 mmHg     Is BP treated: No     HDL Cholesterol: 38 mg/dL     Total Cholesterol: 184 mg/dL Chest pain:  no Coronary artery disease:  no  She currently lives with: husband, kids, grandson Menopausal Symptoms: no  Depression Screen done today and  results listed below:  Depression screen [facility] 2/9 06/27/2019 03/20/2019 02/27/2019 12/13/2018 09/20/2018  Decreased Interest 2 2 3 2 1   Down, Depressed, Hopeless 1 3 3 2 1   PHQ - 2 Score 3 5 6 4 2   Altered sleeping 1 3 3 2 3   Tired, decreased energy 1 3 3 3 2   Change in appetite 2 3 3 3 1   Feeling bad or failure about yourself  0 3 3 2 1   Trouble concentrating 1 3 3 2 3   Moving slowly or fidgety/restless 1 3 3 1 1   Suicidal thoughts 0 0 0 0 0  PHQ-9 Score 9 23 24 17 13   Difficult doing work/chores - Very difficult Extremely dIfficult Very difficult Somewhat difficult  Some recent data might be hidden    Past Medical History:  Past Medical History:  Diagnosis Date  . Allergy   . Aneurysm ([place])   . Anxiety   . Depression   . Dermatofibroma   . Diabetes mellitus without complication ([place])    Takes Metformin  dx 2016  . Facial pain   . GERD (gastroesophageal reflux disease)   . Headache   . Hyperlipidemia   . IFG (impaired fasting glucose)   . Low HDL (under 40)   . Overweight   . Patella-femoral syndrome   . Pinguecula   . Thyroid nodule april 2016  . Vitamin D deficiency disease     Surgical History:  Past Surgical History:  Procedure Laterality Date  . CARPAL TUNNEL RELEASE    . GANGLION CYST EXCISION Left   . IR 3D INDEPENDENT WKST  12/04/2017  . IR ANGIO INTRA EXTRACRAN SEL INTERNAL CAROTID BILAT MOD SED  12/04/2017  . IR ANGIO INTRA EXTRACRAN SEL INTERNAL CAROTID UNI L MOD SED  03/05/2018  . IR ANGIO INTRA EXTRACRAN SEL INTERNAL CAROTID UNI L MOD SED  05/02/2019  . IR ANGIO VERTEBRAL SEL VERTEBRAL BILAT MOD SED  12/04/2017  . IR ANGIOGRAM FOLLOW UP STUDY  03/05/2018  . IR ANGIOGRAM FOLLOW UP STUDY  03/05/2018  . IR ANGIOGRAM FOLLOW UP STUDY  03/05/2018  . IR NEURO EACH ADD'L AFTER BASIC UNI LEFT (MS)  03/05/2018  . IR TRANSCATH/EMBOLIZ  03/05/2018  . NASAL SINUS SURGERY  Nov 2015  . RADIOLOGY WITH ANESTHESIA N/A 03/05/2018   Procedure: Arteriogram, coil embolization, possible  stenting;  Surgeon: Consuella Lose, MD;  Location: [facility];  Service: Radiology;  Laterality: N/A;  . TUBAL LIGATION      Medications:  Current Outpatient Medications on File Prior to Visit  Medication Sig  . ACCU-CHEK GUIDE test strip CHECK BLOOD SUGAR DAILY  . acetaminophen (TYLENOL) 500 MG tablet Take 1,000 mg by mouth daily as needed for moderate pain or headache.  . ARIPiprazole (ABILIFY) 5 MG tablet Take 1 tablet (5 mg total) by mouth 2 (two) times daily. (Patient taking differently: Take 5 mg by mouth daily. )  . ciclopirox (PENLAC) 8 % solution Apply over nail and surrounding skin. Apply daily over previous coat. After seven (7) days, may remove with alcohol and continue cycle. (Patient taking differently: Apply 1 application topically See admin instructions. Apply over nail and surrounding skin. Apply daily over previous coat. After seven (7) days, may remove with alcohol and continue cycle. As needed)  . cyanocobalamin 1000 MCG tablet Take 1,000 mcg by mouth daily.   . hydrOXYzine (ATARAX/VISTARIL) 25 MG tablet Take 0.5-2 tablets (12.5-50 mg total) by mouth 3 (three) times daily as needed.  Marland Kitchen ibuprofen (ADVIL,MOTRIN) 200 MG tablet Take 800 mg by mouth daily as needed for headache or moderate pain.  Marland Kitchen loratadine (CLARITIN) 10 MG tablet Take 10 mg by mouth daily as needed for allergies.   . naproxen (NAPROSYN) 500 MG tablet Take 1 tablet (500 mg total) by mouth 2 (two) times daily with a meal. (Patient taking differently: Take 500 mg by mouth 2 (two) times daily as needed (pain). )  . sertraline (ZOLOFT) 100 MG tablet Take 150 mg by mouth every morning.   . traZODone (DESYREL) 50 MG tablet Take 50 mg by mouth at bedtime as needed for sleep.  Marland Kitchen UNIFINE PENTIPS 31G X 5 MM MISC USE AS DIRECTED WITH SAXENDA  . venlafaxine XR (EFFEXOR XR) 75 MG 24 hr capsule Take 1 capsule (75 mg total) by mouth daily with breakfast.  . venlafaxine XR (EFFEXOR-XR) 150 MG 24 hr capsule Take 1 capsule (150 mg  total) by mouth daily with breakfast.   No current facility-administered medications on file prior to visit.     Allergies:  Allergies  Allergen Reactions  . Imitrex [Sumatriptan] Other (See Comments)    Chest pain   . Wellbutrin [Bupropion] Other (See Comments)    Increased anxiety and tremors    Social History:  Social History   Socioeconomic History  . Marital status: Married    Spouse name: Not on file  . Number of children: Not on file  . Years of education: Not on file  .  Highest education level: Not on file  Occupational History  . Not on file  Social Needs  . Financial resource strain: Not on file  . Food insecurity    Worry: Not on file    Inability: Not on file  . Transportation needs    Medical: Not on file    Non-medical: Not on file  Tobacco Use  . Smoking status: Never Smoker  . Smokeless tobacco: Never Used  Substance and Sexual Activity  . Alcohol use: Not Currently    Alcohol/week: 0.0 - 1.0 standard drinks    Comment: on occasion  . Drug use: No  . Sexual activity: Yes    Birth control/protection: None  Lifestyle  . Physical activity    Days per week: 0 days    Minutes per session: 0 min  . Stress: Only a little  Relationships  . Social connections    Talks on phone: More than three times a week    Gets together: More than three times a week    Attends religious service: More than 4 times per year    Active member of club or organization: No    Attends meetings of clubs or organizations: Never    Relationship status: Married  . Intimate partner violence    Fear of current or ex partner: No    Emotionally abused: No    Physically abused: No    Forced sexual activity: No  Other Topics Concern  . Not on file  Social History Narrative  . Not on file   Social History   Tobacco Use  Smoking Status Never Smoker  Smokeless Tobacco Never Used   Social History   Substance and Sexual Activity  Alcohol Use Not Currently  .  Alcohol/week: 0.0 - 1.0 standard drinks   Comment: on occasion    Family History:  Family History  Problem Relation Age of Onset  . Diabetes Father   . Hypertension Father   . Heart disease Father   . Kidney disease Father   . Stroke Father   . Aneurysm Father   . Diabetes Paternal Grandfather     Past medical history, surgical history, medications, allergies, family history and social history reviewed with patient today and changes made to appropriate areas of the chart.   Review of Systems  Constitutional: Negative.   HENT: Positive for congestion. Negative for ear discharge, ear pain, hearing loss, nosebleeds, sinus pain, sore throat and tinnitus.   Eyes: Negative.   Respiratory: Negative.  Negative for stridor.   Cardiovascular: Negative.   Gastrointestinal: Negative.   Genitourinary: Negative.   Musculoskeletal: Positive for myalgias. Negative for back pain, falls, joint pain and neck pain.  Skin: Positive for rash. Negative for itching.  Neurological: Negative.   Endo/Heme/Allergies: Positive for environmental allergies. Negative for polydipsia. Does not bruise/bleed easily.  Psychiatric/Behavioral: Negative for depression, hallucinations, memory loss, substance abuse and suicidal ideas. The patient is nervous/anxious. The patient does not have insomnia.     All other ROS negative except what is listed above and in the HPI.      Objective:    BP 125/83   Pulse 93   Temp 99 F (37.2 C) (Oral)   Ht 4\' 11"  (1.499 m)   Wt 165 lb (74.8 kg)   SpO2 98%   BMI 33.33 kg/m   Wt Readings from Last 3 Encounters:  06/27/19 165 lb (74.8 kg)  05/02/19 160 lb (72.6 kg)  02/27/19 157 lb (71.2 kg)  Physical Exam  Results for orders placed or performed during the hospital encounter of 05/02/19  APTT  Result Value Ref Range   aPTT 30 24 - 36 seconds  Basic metabolic panel  Result Value Ref Range   Sodium 136 135 - 145 mmol/L   Potassium 4.2 3.5 - 5.1 mmol/L   Chloride  101 98 - 111 mmol/L   CO2 26 22 - 32 mmol/L   Glucose, Bld 128 (H) 70 - 99 mg/dL   BUN 10 6 - 20 mg/dL   Creatinine, Ser 0.65 0.44 - 1.00 mg/dL   Calcium 9.1 8.9 - 10.3 mg/dL   GFR calc non Af Amer >60 >60 mL/min   GFR calc Af Amer >60 >60 mL/min   Anion gap 9 5 - 15  CBC WITH DIFFERENTIAL  Result Value Ref Range   WBC 8.0 4.0 - 10.5 K/uL   RBC 3.75 (L) 3.87 - 5.11 MIL/uL   Hemoglobin 11.5 (L) 12.0 - 15.0 g/dL   HCT 34.5 (L) 36.0 - 46.0 %   MCV 92.0 80.0 - 100.0 fL   MCH 30.7 26.0 - 34.0 pg   MCHC 33.3 30.0 - 36.0 g/dL   RDW 12.9 11.5 - 15.5 %   Platelets 197 150 - 400 K/uL   nRBC 0.0 0.0 - 0.2 %   Neutrophils Relative % 62 %   Neutro Abs 5.0 1.7 - 7.7 K/uL   Lymphocytes Relative 24 %   Lymphs Abs 1.9 0.7 - 4.0 K/uL   Monocytes Relative 8 %   Monocytes Absolute 0.7 0.1 - 1.0 K/uL   Eosinophils Relative 4 %   Eosinophils Absolute 0.3 0.0 - 0.5 K/uL   Basophils Relative 1 %   Basophils Absolute 0.0 0.0 - 0.1 K/uL   Immature Granulocytes 1 %   Abs Immature Granulocytes 0.08 (H) 0.00 - 0.07 K/uL  Protime-INR  Result Value Ref Range   Prothrombin Time 12.5 11.4 - 15.2 seconds   INR 1.0 0.8 - 1.2      Assessment & Plan:   Problem List Items Addressed This Visit      Endocrine   IFG (impaired fasting glucose)    A1c up at 5.9- continue to work on diet and exercise. Call with any concerns.       Relevant Orders   Bayer DCA Hb A1c Waived   CBC with Differential/Platelet   Comprehensive metabolic panel   Microalbumin, Urine Waived   UA/M w/rflx Culture, Routine   Thyroid nodule    Checking TSH today. Await results. Call with any concerns.       Relevant Medications   propranolol ER (INDERAL LA) 80 MG 24 hr capsule   Other Relevant Orders   CBC with Differential/Platelet   Comprehensive metabolic panel   TSH   UA/M w/rflx Culture, Routine     Nervous and Auditory   Trigeminal neuralgia    Under good control on current regimen. Continue current regimen.  Continue to monitor. Call with any concerns. Refills given.        Relevant Medications   nortriptyline (PAMELOR) 50 MG capsule   Liraglutide -Weight Management (SAXENDA) 18 MG/3ML SOPN     Other   Hyperlipidemia    Rechecking levels today. Will treat as needed. Call with any concerns.       Relevant Medications   propranolol ER (INDERAL LA) 80 MG 24 hr capsule   Other Relevant Orders   CBC with Differential/Platelet   Comprehensive metabolic panel   Lipid  Panel w/o Chol/HDL Ratio   UA/M w/rflx Culture, Routine   Vitamin D deficiency disease    Rechecking levels today. Will treat as needed. Call with any concerns.       Relevant Orders   CBC with Differential/Platelet   Comprehensive metabolic panel   UA/M w/rflx Culture, Routine   VITAMIN D 25 Hydroxy (Vit-D Deficiency, Fractures)   Depression    Continuing to follow with psychiatry. Call with any concerns. Continue to monitor.       Relevant Medications   nortriptyline (PAMELOR) 50 MG capsule   Other Relevant Orders   CBC with Differential/Platelet   Comprehensive metabolic panel   TSH   UA/M w/rflx Culture, Routine    Other Visit Diagnoses    Routine general medical examination at a health care facility    -  Primary   Vaccines up to date. Screening labs checked today. Pap up to date. Mammogram ordered. Continue diet and exercise. Call with any concerns.    Relevant Orders   Bayer DCA Hb A1c Waived   CBC with Differential/Platelet   Comprehensive metabolic panel   Lipid Panel w/o Chol/HDL Ratio   Microalbumin, Urine Waived   TSH   UA/M w/rflx Culture, Routine   VITAMIN D 25 Hydroxy (Vit-D Deficiency, Fractures)   Screening for breast cancer       Mammogram ordered today.   Relevant Orders   MM DIGITAL SCREENING BILATERAL   Irritant contact dermatitis due to other agents       Start hydrocortisone and start wearing cloth mask under required mask. Call with any concerns.        Follow up plan: Return in  about 6 months (around 12/28/2019).   LABORATORY TESTING:  - Pap smear: up to date  IMMUNIZATIONS:   - Tdap: Tetanus vaccination status reviewed: last tetanus booster within 10 years. - Influenza: Postponed to flu season - Pneumovax: Not applicable  SCREENING: -Mammogram: Ordered today   PATIENT COUNSELING:   Advised to take 1 mg of folate supplement per day if capable of pregnancy.   Sexuality: Discussed sexually transmitted diseases, partner selection, use of condoms, avoidance of unintended pregnancy  and contraceptive alternatives.   Advised to avoid cigarette smoking.  I discussed with the patient that most people either abstain from alcohol or drink within safe limits (<=14/week and <=4 drinks/occasion for males, <=7/weeks and <= 3 drinks/occasion for females) and that the risk for alcohol disorders and other health effects rises proportionally with the number of drinks per week and how often a drinker exceeds daily limits.  Discussed cessation/primary prevention of drug use and availability of treatment for abuse.   Diet: Encouraged to adjust caloric intake to maintain  or achieve ideal body weight, to reduce intake of dietary saturated fat and total fat, to limit sodium intake by avoiding high sodium foods and not adding table salt, and to maintain adequate dietary potassium and calcium preferably from fresh fruits, vegetables, and low-fat dairy products.    stressed the importance of regular exercise  Injury prevention: Discussed safety belts, safety helmets, smoke detector, smoking near bedding or upholstery.   Dental health: Discussed importance of regular tooth brushing, flossing, and dental visits.    NEXT PREVENTATIVE PHYSICAL DUE IN 1 YEAR. Return in about 6 months (around 12/28/2019).

## 2019-06-27 NOTE — Assessment & Plan Note (Signed)
Under good control on current regimen. Continue current regimen. Continue to monitor. Call with any concerns. Refills given.   

## 2019-06-27 NOTE — Assessment & Plan Note (Signed)
Checking TSH today. Await results. Call with any concerns.

## 2019-06-27 NOTE — Assessment & Plan Note (Signed)
A1c up at 5.9- continue to work on diet and exercise. Call with any concerns.

## 2019-06-27 NOTE — Patient Instructions (Addendum)
Call for your mammogram: [facility]  Address: [address], [place], [place] [address]  Phone: [phone]   Health Maintenance, Female Adopting a healthy lifestyle and getting preventive care are important in promoting health and wellness. Ask your health care provider about:  The right schedule for you to have regular tests and exams.  Things you can do on your own to prevent diseases and keep yourself healthy. What should I know about diet, weight, and exercise? Eat a healthy diet   Eat a diet that includes plenty of vegetables, fruits, low-fat dairy products, and lean protein.  Do not eat a lot of foods that are high in solid fats, added sugars, or sodium. Maintain a healthy weight Body mass index (BMI) is used to identify weight problems. It estimates body fat based on height and weight. Your health care provider can help determine your BMI and help you achieve or maintain a healthy weight. Get regular exercise Get regular exercise. This is one of the most important things you can do for your health. Most adults should:  Exercise for at least 150 minutes each week. The exercise should increase your heart rate and make you sweat (moderate-intensity exercise).  Do strengthening exercises at least twice a week. This is in addition to the moderate-intensity exercise.  Spend less time sitting. Even light physical activity can be beneficial. Watch cholesterol and blood lipids Have your blood tested for lipids and cholesterol at 42 years of age, then have this test every 5 years. Have your cholesterol levels checked more often if:  Your lipid or cholesterol levels are high.  You are older than 42 years of age.  You are at high risk for heart disease. What should I know about cancer screening? Depending on your health history and family history, you may need to have cancer screening at various ages. This may include screening  for:  Breast cancer.  Cervical cancer.  Colorectal cancer.  Skin cancer.  Lung cancer. What should I know about heart disease, diabetes, and high blood pressure? Blood pressure and heart disease  High blood pressure causes heart disease and increases the risk of stroke. This is more likely to develop in people who have high blood pressure readings, are of African descent, or are overweight.  Have your blood pressure checked: ? Every 3-5 years if you are 18-39 years of age. ? Every year if you are 40 years old or older. Diabetes Have regular diabetes screenings. This checks your fasting blood sugar level. Have the screening done:  Once every three years after age 40 if you are at a normal weight and have a low risk for diabetes.  More often and at a younger age if you are overweight or have a high risk for diabetes. What should I know about preventing infection? Hepatitis B If you have a higher risk for hepatitis B, you should be screened for this virus. Talk with your health care provider to find out if you are at risk for hepatitis B infection. Hepatitis C Testing is recommended for:  Everyone born from 1945 through 1965.  Anyone with known risk factors for hepatitis C. Sexually transmitted infections (STIs)  Get screened for STIs, including gonorrhea and chlamydia, if: ? You are sexually active and are younger than 42 years of age. ? You are older than 42 years of age and your health care provider tells you that you are at risk for this type of infection. ?   Your sexual activity has changed since you were last screened, and you are at increased risk for chlamydia or gonorrhea. Ask your health care provider if you are at risk.  Ask your health care provider about whether you are at high risk for HIV. Your health care provider may recommend a prescription medicine to help prevent HIV infection. If you choose to take medicine to prevent HIV, you should first get tested for HIV.  You should then be tested every 3 months for as long as you are taking the medicine. Pregnancy  If you are about to stop having your period (premenopausal) and you may become pregnant, seek counseling before you get pregnant.  Take 400 to 800 micrograms (mcg) of folic acid every day if you become pregnant.  Ask for birth control (contraception) if you want to prevent pregnancy. Osteoporosis and menopause Osteoporosis is a disease in which the bones lose minerals and strength with aging. This can result in bone fractures. If you are 65 years old or older, or if you are at risk for osteoporosis and fractures, ask your health care provider if you should:  Be screened for bone loss.  Take a calcium or vitamin D supplement to lower your risk of fractures.  Be given hormone replacement therapy (HRT) to treat symptoms of menopause. Follow these instructions at home: Lifestyle  Do not use any products that contain nicotine or tobacco, such as cigarettes, e-cigarettes, and chewing tobacco. If you need help quitting, ask your health care provider.  Do not use street drugs.  Do not share needles.  Ask your health care provider for help if you need support or information about quitting drugs. Alcohol use  Do not drink alcohol if: ? Your health care provider tells you not to drink. ? You are pregnant, may be pregnant, or are planning to become pregnant.  If you drink alcohol: ? Limit how much you use to 0-1 drink a day. ? Limit intake if you are breastfeeding.  Be aware of how much alcohol is in your drink. In the [place] one drink equals one 12 oz bottle of beer (355 mL), one 5 oz glass of wine (148 mL), or one 1 oz glass of hard liquor (44 mL). General instructions  Schedule regular health, dental, and eye exams.  Stay current with your vaccines.  Tell your health care provider if: ? You often feel depressed. ? You have ever been abused or do not feel safe at  home. Summary  Adopting a healthy lifestyle and getting preventive care are important in promoting health and wellness.  Follow your health care provider's instructions about healthy diet, exercising, and getting tested or screened for diseases.  Follow your health care provider's instructions on monitoring your cholesterol and blood pressure. This information is not intended to replace advice given to you by your health care provider. Make sure you discuss any questions you have with your health care provider. Document Released: 05/22/2011 Document Revised: 10/30/2018 Document Reviewed: 10/30/2018 Elsevier Patient Education  2020 Elsevier Inc.  

## 2019-06-28 LAB — COMPREHENSIVE METABOLIC PANEL
ALT: 25 IU/L (ref 0–32)
AST: 19 IU/L (ref 0–40)
Albumin/Globulin Ratio: 1.8 (ref 1.2–2.2)
Albumin: 4.3 g/dL (ref 3.8–4.8)
Alkaline Phosphatase: 99 IU/L (ref 39–117)
BUN/Creatinine Ratio: 14 (ref 9–23)
BUN: 8 mg/dL (ref 6–24)
Bilirubin Total: 0.5 mg/dL (ref 0.0–1.2)
CO2: 22 mmol/L (ref 20–29)
Calcium: 9.3 mg/dL (ref 8.7–10.2)
Chloride: 99 mmol/L (ref 96–106)
Creatinine, Ser: 0.59 mg/dL (ref 0.57–1.00)
GFR calc Af Amer: 132 mL/min/{1.73_m2} (ref >59)
GFR calc non Af Amer: 114 mL/min/{1.73_m2} (ref >59)
Globulin, Total: 2.4 g/dL (ref 1.5–4.5)
Glucose: 125 mg/dL — ABNORMAL HIGH (ref 65–99)
Potassium: 4.8 mmol/L (ref 3.5–5.2)
Sodium: 135 mmol/L (ref 134–144)
Total Protein: 6.7 g/dL (ref 6.0–8.5)

## 2019-06-28 LAB — CBC WITH DIFFERENTIAL/PLATELET
Basophils Absolute: 0 10*3/uL (ref 0.0–0.2)
Basos: 1 %
EOS (ABSOLUTE): 0.2 10*3/uL (ref 0.0–0.4)
Eos: 3 %
Hematocrit: 37 % (ref 34.0–46.6)
Hemoglobin: 12.2 g/dL (ref 11.1–15.9)
Immature Grans (Abs): 0.1 10*3/uL (ref 0.0–0.1)
Immature Granulocytes: 1 %
Lymphocytes Absolute: 1.9 10*3/uL (ref 0.7–3.1)
Lymphs: 26 %
MCH: 29.8 pg (ref 26.6–33.0)
MCHC: 33 g/dL (ref 31.5–35.7)
MCV: 90 fL (ref 79–97)
Monocytes Absolute: 0.6 10*3/uL (ref 0.1–0.9)
Monocytes: 8 %
Neutrophils Absolute: 4.5 10*3/uL (ref 1.4–7.0)
Neutrophils: 61 %
Platelets: 218 10*3/uL (ref 150–450)
RBC: 4.1 x10E6/uL (ref 3.77–5.28)
RDW: 13.6 % (ref 11.7–15.4)
WBC: 7.3 10*3/uL (ref 3.4–10.8)

## 2019-06-28 LAB — VITAMIN D 25 HYDROXY (VIT D DEFICIENCY, FRACTURES): Vit D, 25-Hydroxy: 21 ng/mL — ABNORMAL LOW (ref 30.0–100.0)

## 2019-06-28 LAB — LIPID PANEL W/O CHOL/HDL RATIO
Cholesterol, Total: 191 mg/dL (ref 100–199)
HDL: 37 mg/dL — ABNORMAL LOW (ref >39)
Triglycerides: 450 mg/dL — ABNORMAL HIGH (ref 0–149)

## 2019-06-28 LAB — TSH: TSH: 1.96 u[IU]/mL (ref 0.450–4.500)

## 2019-07-12 IMAGING — MR MR MRA HEAD W/O CM
2 series · 19 of 48 positions shown · non-contrast
Comparison: Maxillofacial CT 09/08/2014.

CLINICAL DATA: Headache. Possible anterior communicating artery
aneurysm on maxillofacial CT.

EXAM:
MRA HEAD WITHOUT CONTRAST
TECHNIQUE: Angiographic images of the Circle of Willis were obtained using MRA
technique without intravenous contrast.

[Series 3: (id) mt fs · axial · 1.4mm · 0.39mm/px · z∈[-32,+63]mm · 18 of 141 slices shown]
[im 1/141]
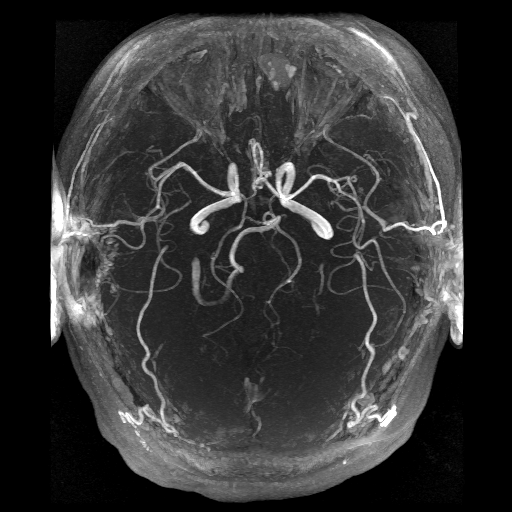
[im 4/141]
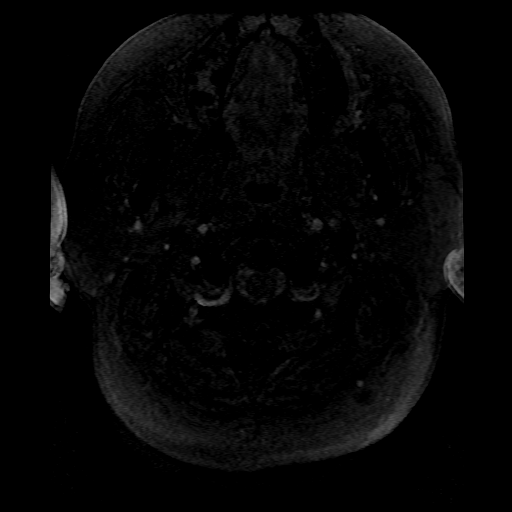
[im 7/141]
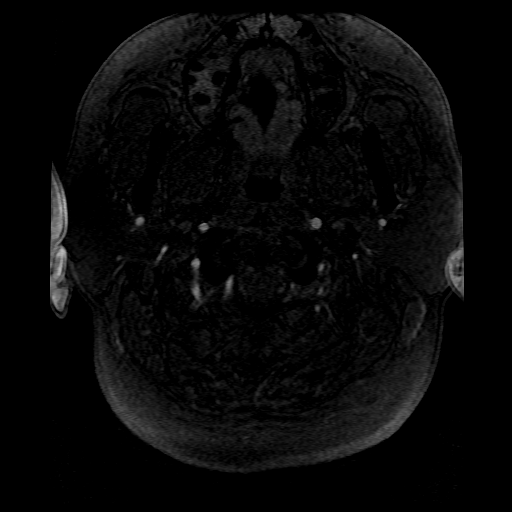
[im 10/141]
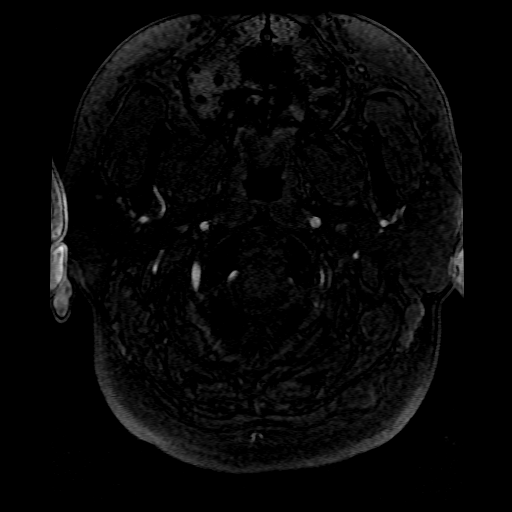
[im 13/141]
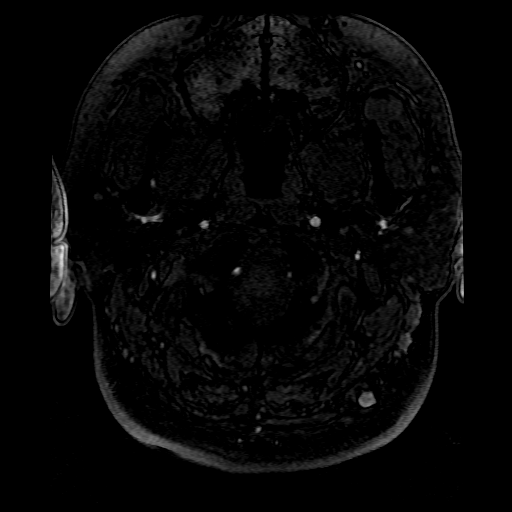
[im 16/141]
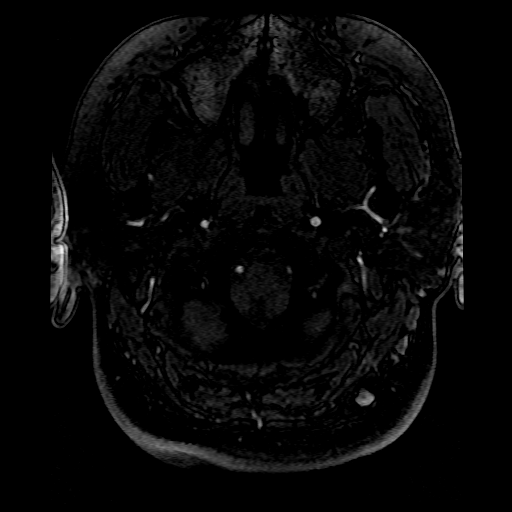
[im 19/141]
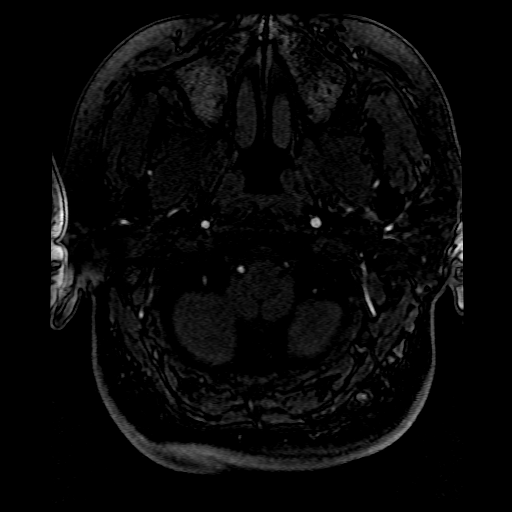
[im 22/141]
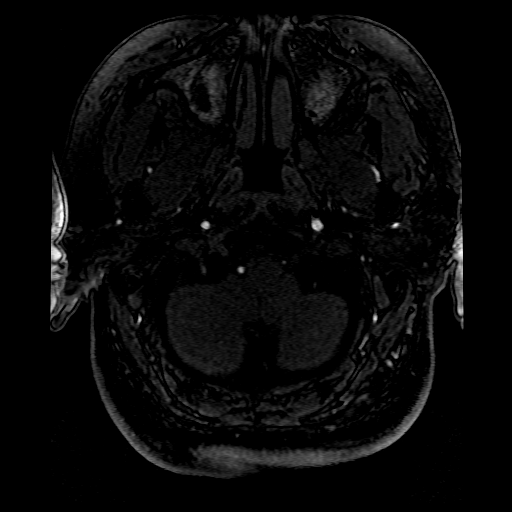
[im 25/141]
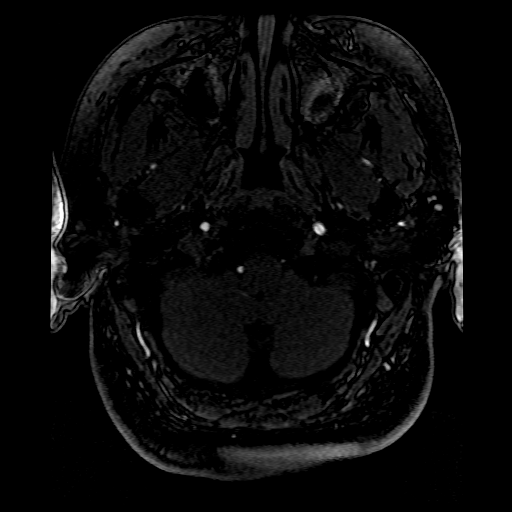
[im 28/141]
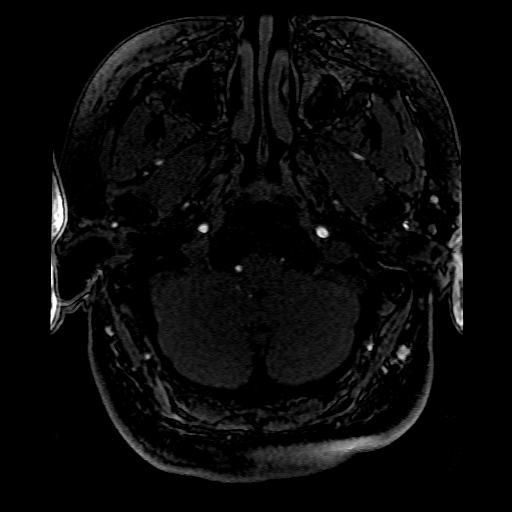
[im 43/141]
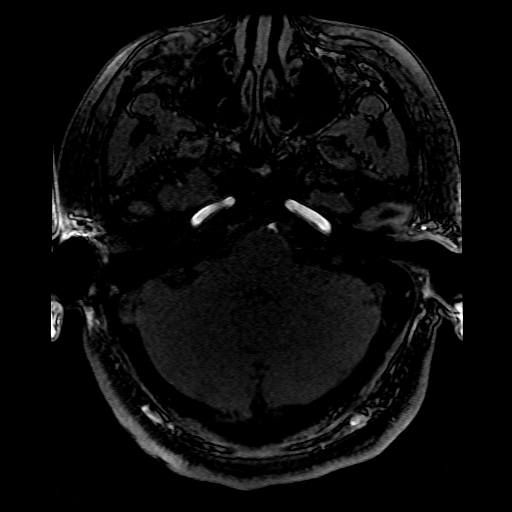
[im 61/141]
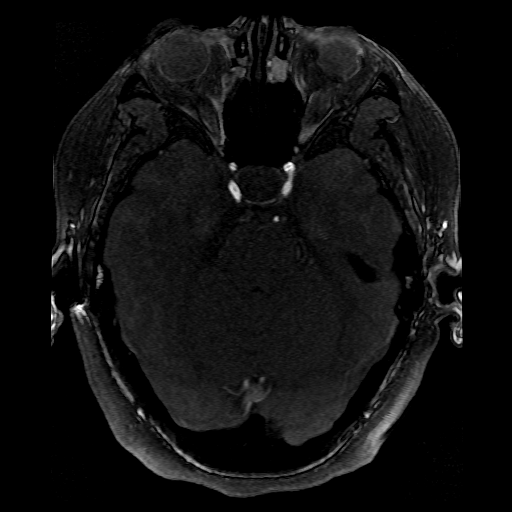
[im 71/141]
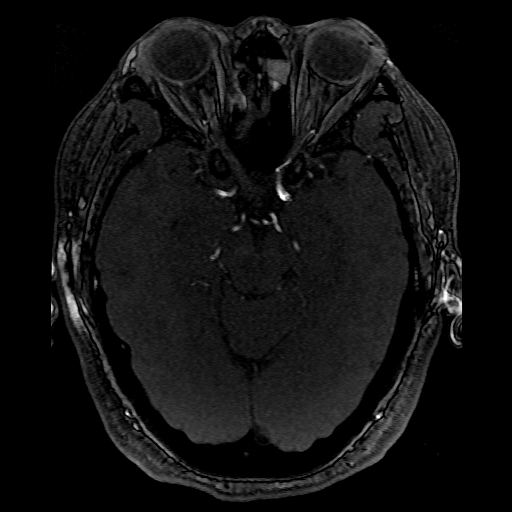
[im 80/141]
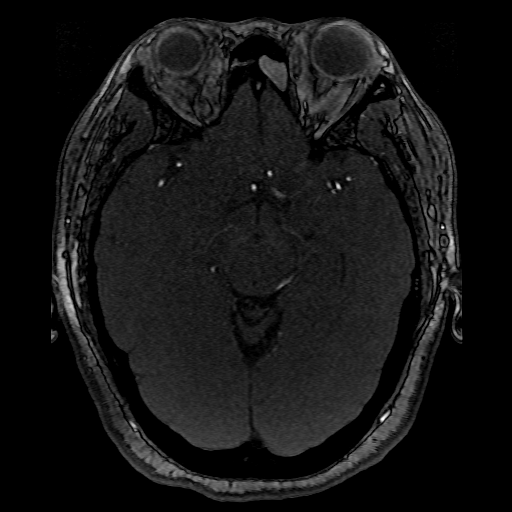
[im 98/141]
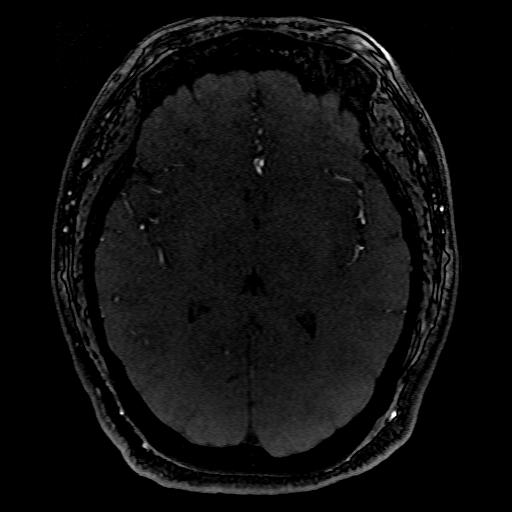
[im 116/141]
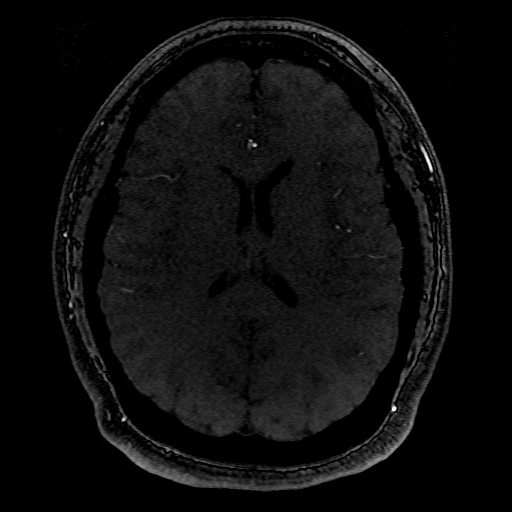
[im 119/141]
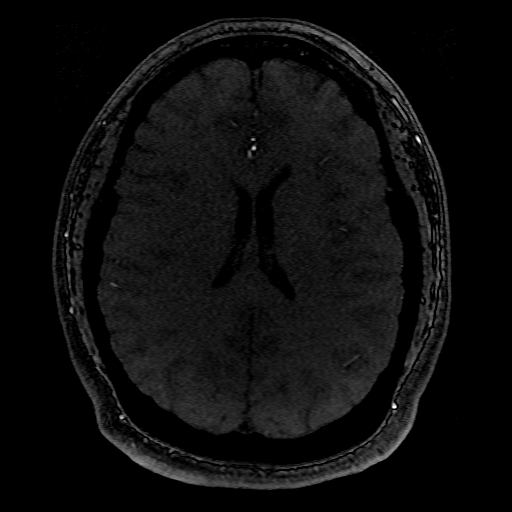
[im 134/141]
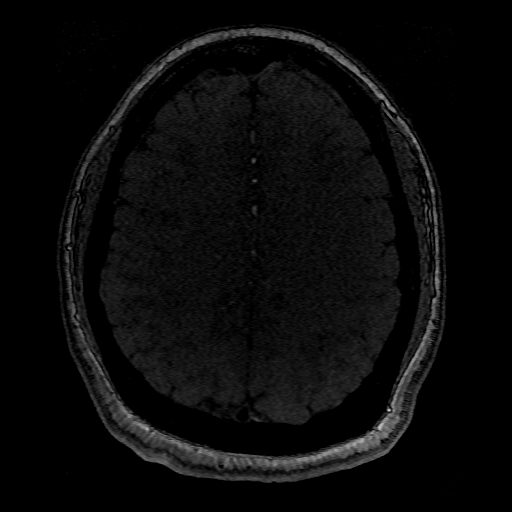

[Series 303: pjn · axial · 1.4mm · 0.39mm/px · 1 of 1 slices shown]
[im 1/1]
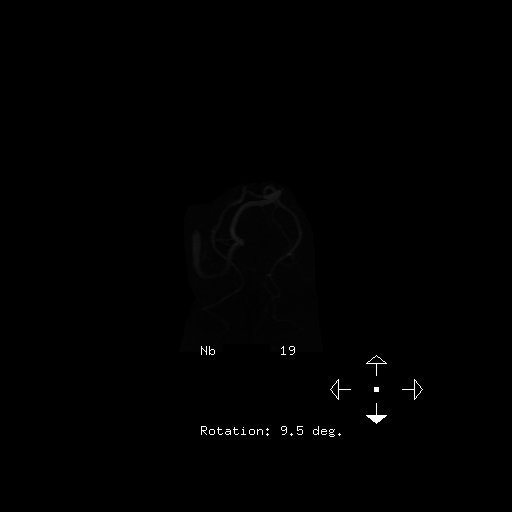

[19 of 48 positions shown; findings below may reference images not displayed]

FINDINGS: The visualized distal vertebral arteries are patent to the basilar
with the right being strongly dominant. Dominant right PICA and left
AICA. Patent bilateral SCA origins. Widely patent basilar artery.
Moderately large posterior communicating arteries bilaterally. The
PCAs are patent without evidence of significant stenosis.

The internal carotid arteries are widely patent from skullbase to
carotid termini. The ACAs and MCAs are patent without evidence of
proximal branch occlusion or significant proximal stenosis. There is
a 5 x 3 mm aneurysm which projects medially from the region of the
left A1-A2 junction. The anterior communicating artery courses
posterior to the aneurysm.
IMPRESSION: 5 x 3 mm anterior communicating region aneurysm.

## 2019-09-11 ENCOUNTER — Telehealth: Payer: Self-pay

## 2019-09-11 NOTE — Telephone Encounter (Signed)
Per cover my meds online, Kirke Shaggy does not need a PA Copied from [place] [id number]. Topic: General - Other >> Sep 10, 2019  3:21 PM Celene Kras A wrote: Reason for CRM: Ulice Dash, from covermymeds, called stating pt is needing saxsenda medication renewed. Please advise.   [phone]

## 2019-09-30 ENCOUNTER — Encounter: Payer: Self-pay | Admitting: Family Medicine

## 2019-10-01 ENCOUNTER — Other Ambulatory Visit: Payer: Self-pay | Admitting: Family Medicine

## 2019-10-02 ENCOUNTER — Encounter: Payer: Self-pay | Admitting: Family Medicine

## 2019-10-02 ENCOUNTER — Ambulatory Visit (INDEPENDENT_AMBULATORY_CARE_PROVIDER_SITE_OTHER): Payer: No Typology Code available for payment source | Admitting: Family Medicine

## 2019-10-02 ENCOUNTER — Other Ambulatory Visit: Payer: Self-pay

## 2019-10-02 VITALS — BP 136/84 | HR 88 | Temp 97.6°F | Ht 59.0 in | Wt 167.0 lb

## 2019-10-02 DIAGNOSIS — M5442 Lumbago with sciatica, left side: Secondary | ICD-10-CM

## 2019-10-02 DIAGNOSIS — M5441 Lumbago with sciatica, right side: Secondary | ICD-10-CM

## 2019-10-02 DIAGNOSIS — E6609 Other obesity due to excess calories: Secondary | ICD-10-CM

## 2019-10-02 DIAGNOSIS — Z6833 Body mass index (BMI) 33.0-33.9, adult: Secondary | ICD-10-CM | POA: Diagnosis not present

## 2019-10-02 DIAGNOSIS — M79605 Pain in left leg: Secondary | ICD-10-CM

## 2019-10-02 DIAGNOSIS — M79604 Pain in right leg: Secondary | ICD-10-CM | POA: Diagnosis not present

## 2019-10-02 MED ORDER — CYCLOBENZAPRINE HCL 10 MG PO TABS: 10.0000 mg | ORAL_TABLET | Freq: Every day | ORAL | 1 refills | Status: DC

## 2019-10-02 NOTE — Patient Instructions (Signed)
Sciatica Rehab Ask your health care provider which exercises are safe for you. Do exercises exactly as told by your health care provider and adjust them as directed. It is normal to feel mild stretching, pulling, tightness, or discomfort as you do these exercises. Stop right away if you feel sudden pain or your pain gets worse. Do not begin these exercises until told by your health care provider. Stretching and range-of-motion exercises These exercises warm up your muscles and joints and improve the movement and flexibility of your hips and back. These exercises also help to relieve pain, numbness, and tingling. Sciatic nerve glide 1. Sit in a chair with your head facing down toward your chest. Place your hands behind your back. Let your shoulders slump forward. 2. Slowly straighten one of your legs while you tilt your head back as if you are looking toward the ceiling. Only straighten your leg as far as you can without making your symptoms worse. 3. Hold this position for __________ seconds. 4. Slowly return to the starting position. 5. Repeat with your other leg. Repeat __________ times. Complete this exercise __________ times a day. Knee to chest with hip adduction and internal rotation  1. Lie on your back on a firm surface with both legs straight. 2. Bend one of your knees and move it up toward your chest until you feel a gentle stretch in your lower back and buttock. Then, move your knee toward the shoulder that is on the opposite side from your leg. This is hip adduction and internal rotation. ? Hold your leg in this position by holding on to the front of your knee. 3. Hold this position for __________ seconds. 4. Slowly return to the starting position. 5. Repeat with your other leg. Repeat __________ times. Complete this exercise __________ times a day. Prone extension on elbows  1. Lie on your abdomen on a firm surface. A bed may be too soft for this exercise. 2. Prop yourself up on  your elbows. 3. Use your arms to help lift your chest up until you feel a gentle stretch in your abdomen and your lower back. ? This will place some of your body weight on your elbows. If this is uncomfortable, try stacking pillows under your chest. ? Your hips should stay down, against the surface that you are lying on. Keep your hip and back muscles relaxed. 4. Hold this position for __________ seconds. 5. Slowly relax your upper body and return to the starting position. Repeat __________ times. Complete this exercise __________ times a day. Strengthening exercises These exercises build strength and endurance in your back. Endurance is the ability to use your muscles for a long time, even after they get tired. Pelvic tilt This exercise strengthens the muscles that lie deep in the abdomen. 1. Lie on your back on a firm surface. Bend your knees and keep your feet flat on the floor. 2. Tense your abdominal muscles. Tip your pelvis up toward the ceiling and flatten your lower back into the floor. ? To help with this exercise, you may place a small towel under your lower back and try to push your back into the towel. 3. Hold this position for __________ seconds. 4. Let your muscles relax completely before you repeat this exercise. Repeat __________ times. Complete this exercise __________ times a day. Alternating arm and leg raises  1. Get on your hands and knees on a firm surface. If you are on a hard floor, you may want to use   padding, such as an exercise mat, to cushion your knees. 2. Line up your arms and legs. Your hands should be directly below your shoulders, and your knees should be directly below your hips. 3. Lift your left leg behind you. At the same time, raise your right arm and straighten it in front of you. ? Do not lift your leg higher than your hip. ? Do not lift your arm higher than your shoulder. ? Keep your abdominal and back muscles tight. ? Keep your hips facing the  ground. ? Do not arch your back. ? Keep your balance carefully, and do not hold your breath. 4. Hold this position for __________ seconds. 5. Slowly return to the starting position. 6. Repeat with your right leg and your left arm. Repeat __________ times. Complete this exercise __________ times a day. Posture and body mechanics Good posture and healthy body mechanics can help to relieve stress in your body's tissues and joints. Body mechanics refers to the movements and positions of your body while you do your daily activities. Posture is part of body mechanics. Good posture means:  Your spine is in its natural S-curve position (neutral).  Your shoulders are pulled back slightly.  Your head is not tipped forward. Follow these guidelines to improve your posture and body mechanics in your everyday activities. Standing   When standing, keep your spine neutral and your feet about hip width apart. Keep a slight bend in your knees. Your ears, shoulders, and hips should line up.  When you do a task in which you stand in one place for a long time, place one foot up on a stable object that is 2-4 inches (5-10 cm) high, such as a footstool. This helps keep your spine neutral. Sitting   When sitting, keep your spine neutral and keep your feet flat on the floor. Use a footrest, if necessary, and keep your thighs parallel to the floor. Avoid rounding your shoulders, and avoid tilting your head forward.  When working at a desk or a computer, keep your desk at a height where your hands are slightly lower than your elbows. Slide your chair under your desk so you are close enough to maintain good posture.  When working at a computer, place your monitor at a height where you are looking straight ahead and you do not have to tilt your head forward or downward to look at the screen. Resting  When lying down and resting, avoid positions that are most painful for you.  If you have pain with activities  such as sitting, bending, stooping, or squatting, lie in a position in which your body does not bend very much. For example, avoid curling up on your side with your arms and knees near your chest (fetal position).  If you have pain with activities such as standing for a long time or reaching with your arms, lie with your spine in a neutral position and bend your knees slightly. Try the following positions: ? Lying on your side with a pillow between your knees. ? Lying on your back with a pillow under your knees. Lifting   When lifting objects, keep your feet at least shoulder width apart and tighten your abdominal muscles.  Bend your knees and hips and keep your spine neutral. It is important to lift using the strength of your legs, not your back. Do not lock your knees straight out.  Always ask for help to lift heavy or awkward objects. This information is not   intended to replace advice given to you by your health care provider. Make sure you discuss any questions you have with your health care provider. Document Released: 11/06/2005 Document Revised: 02/28/2019 Document Reviewed: 11/28/2018 Elsevier Patient Education  2020 Elsevier Inc.  

## 2019-10-02 NOTE — Progress Notes (Signed)
BP 136/84   Pulse 88   Temp 97.6 F (36.4 C)   Ht 4\' 11"  (1.499 m)   Wt 167 lb (75.8 kg)   BMI 33.73 kg/m    Subjective:    Patient ID: Ashley Wiley, female    DOB: 1975/12/12, 43 y.o.   MRN: [id number]  HPI: Ashley Wiley is a 43 y.o. female  Chief Complaint  Patient presents with  . Weight Gain  . Leg Pain    both legs knee down   WEIGHT GAIN- Has been off of the abilify and was hoping to lose some weight. She has started to try to be more active. Still not doing any formal exercise- just increasing activity. Has been cutting back on portions and sweets.  Duration: chronic Previous attempts at weight loss: yes Complications of obesity: IFG, HLD Peak weight: current (167) Weight loss goal: 15lbs Weight loss to date: none Requesting obesity pharmacotherapy: yes Current weight loss supplements/medications: yes- saxenda Previous weight loss supplements/meds: yes, topamax  Has been having a burning pain sensation in both of her legs. She has been having pain in her calves. This has been going on for about 3 weeks. No injury. She notes that her back started hurting about 2-3 days ago. Pain is in both legs. Pain comes and goes, sometimes it's all the time from morning to night, sometimes it's not as bad. Walking makes her feet hurt. Nothing makes the pain in her legs come on. Pain stays in her calves. Pain is worse with walking. She is able to walk about 5 minutes and then the pain comes on. Does not go away with rest. No cramping, just burning. Happens with all types of shoes.   Relevant past medical, surgical, family and social history reviewed and updated as indicated. Interim medical history since our last visit reviewed. Allergies and medications reviewed and updated.  Review of Systems  Constitutional: Negative.   Respiratory: Negative.   Cardiovascular: Negative.   Musculoskeletal: Positive for back pain and myalgias. Negative for arthralgias, gait problem, joint  swelling, neck pain and neck stiffness.  Skin: Negative.   Neurological: Negative.   Psychiatric/Behavioral: Negative.     Per HPI unless specifically indicated above     Objective:    BP 136/84   Pulse 88   Temp 97.6 F (36.4 C)   Ht 4\' 11"  (1.499 m)   Wt 167 lb (75.8 kg)   BMI 33.73 kg/m   Wt Readings from Last 3 Encounters:  10/02/19 167 lb (75.8 kg)  06/27/19 165 lb (74.8 kg)  05/02/19 160 lb (72.6 kg)    Physical Exam Vitals signs and nursing note reviewed.  Constitutional:      General: She is not in acute distress.    Appearance: Normal appearance. She is not ill-appearing, toxic-appearing or diaphoretic.  HENT:     Head: Normocephalic and atraumatic.     Right Ear: External ear normal.     Left Ear: External ear normal.     Nose: Nose normal.     Mouth/Throat:     Mouth: Mucous membranes are moist.     Pharynx: Oropharynx is clear.  Eyes:     General: No scleral icterus.       Right eye: No discharge.        Left eye: No discharge.     Conjunctiva/sclera: Conjunctivae normal.     Pupils: Pupils are equal, round, and reactive to light.  Neck:  Musculoskeletal: Normal range of motion.  Pulmonary:     Effort: Pulmonary effort is normal. No respiratory distress.     Comments: Speaking in full sentences Musculoskeletal: Normal range of motion.  Skin:    Coloration: Skin is not jaundiced or pale.     Findings: No bruising, erythema, lesion or rash.  Neurological:     Mental Status: She is alert and oriented to person, place, and time. Mental status is at baseline.  Psychiatric:        Mood and Affect: Mood normal.        Behavior: Behavior normal.        Thought Content: Thought content normal.        Judgment: Judgment normal.     Results for orders placed or performed in visit on 06/27/19  Bayer DCA Hb A1c Waived  Result Value Ref Range   HB A1C (BAYER DCA - WAIVED) 5.9 <7.0 %  CBC with Differential/Platelet  Result Value Ref Range   WBC 7.3  3.4 - 10.8 x10E3/uL   RBC 4.10 3.77 - 5.28 x10E6/uL   Hemoglobin 12.2 11.1 - 15.9 g/dL   Hematocrit 37.0 34.0 - 46.6 %   MCV 90 79 - 97 fL   MCH 29.8 26.6 - 33.0 pg   MCHC 33.0 31.5 - 35.7 g/dL   RDW 13.6 11.7 - 15.4 %   Platelets 218 150 - 450 x10E3/uL   Neutrophils 61 Not Estab. %   Lymphs 26 Not Estab. %   Monocytes 8 Not Estab. %   Eos 3 Not Estab. %   Basos 1 Not Estab. %   Neutrophils Absolute 4.5 1.4 - 7.0 x10E3/uL   Lymphocytes Absolute 1.9 0.7 - 3.1 x10E3/uL   Monocytes Absolute 0.6 0.1 - 0.9 x10E3/uL   EOS (ABSOLUTE) 0.2 0.0 - 0.4 x10E3/uL   Basophils Absolute 0.0 0.0 - 0.2 x10E3/uL   Immature Granulocytes 1 Not Estab. %   Immature Grans (Abs) 0.1 0.0 - 0.1 x10E3/uL  Comprehensive metabolic panel  Result Value Ref Range   Glucose 125 (H) 65 - 99 mg/dL   BUN 8 6 - 24 mg/dL   Creatinine, Ser 0.59 0.57 - 1.00 mg/dL   GFR calc non Af Amer 114 >59 mL/min/1.73   GFR calc Af Amer 132 >59 mL/min/1.73   BUN/Creatinine Ratio 14 9 - 23   Sodium 135 134 - 144 mmol/L   Potassium 4.8 3.5 - 5.2 mmol/L   Chloride 99 96 - 106 mmol/L   CO2 22 20 - 29 mmol/L   Calcium 9.3 8.7 - 10.2 mg/dL   Total Protein 6.7 6.0 - 8.5 g/dL   Albumin 4.3 3.8 - 4.8 g/dL   Globulin, Total 2.4 1.5 - 4.5 g/dL   Albumin/Globulin Ratio 1.8 1.2 - 2.2   Bilirubin Total 0.5 0.0 - 1.2 mg/dL   Alkaline Phosphatase 99 39 - 117 IU/L   AST 19 0 - 40 IU/L   ALT 25 0 - 32 IU/L  Lipid Panel w/o Chol/HDL Ratio  Result Value Ref Range   Cholesterol, Total 191 100 - 199 mg/dL   Triglycerides 450 (H) 0 - 149 mg/dL   HDL 37 (L) >39 mg/dL   VLDL Cholesterol Cal Comment 5 - 40 mg/dL   LDL Calculated Comment 0 - 99 mg/dL  Microalbumin, Urine Waived  Result Value Ref Range   Microalb, Ur Waived 10 0 - 19 mg/L   Creatinine, Urine Waived 10 10 - 300 mg/dL   Microalb/Creat Ratio 30-300 (  H) <30 mg/g  TSH  Result Value Ref Range   TSH 1.960 0.450 - 4.500 uIU/mL  UA/M w/rflx Culture, Routine   Specimen: Blood   BLD   Result Value Ref Range   Specific Gravity, UA 1.015 1.005 - 1.030   pH, UA 7.5 5.0 - 7.5   Color, UA Yellow Yellow   Appearance Ur Clear Clear   Leukocytes,UA Negative Negative   Protein,UA Negative Negative/Trace   Glucose, UA Negative Negative   Ketones, UA Negative Negative   RBC, UA Negative Negative   Bilirubin, UA Negative Negative   Urobilinogen, Ur 0.2 0.2 - 1.0 mg/dL   Nitrite, UA Negative Negative  VITAMIN D 25 Hydroxy (Vit-D Deficiency, Fractures)  Result Value Ref Range   Vit D, 25-Hydroxy 21.0 (L) 30.0 - 100.0 ng/mL      Assessment & Plan:   Problem List Items Addressed This Visit      Other   Obesity - Primary    Will continue saxenda. Now off abilify (managed by psychiatry). We will refer to weight management- referral made today. Will try to get her legs better to allow for increased exercise. Call with any concerns.       Relevant Orders   Amb Ref to Medical Weight Management    Other Visit Diagnoses    Acute bilateral low back pain with bilateral sciatica       Seems more nerve pain or muscular than vascular. Will obtain lumbar x-rays, continue nortriptyline, start flexeril and exercises. Call with any concerns.    Relevant Medications   cyclobenzaprine (FLEXERIL) 10 MG tablet   Other Relevant Orders   DG Lumbar Spine Complete   Bilateral leg pain       Seems more nerve pain or muscular than vascular. Will obtain lumbar x-rays, continue nortriptyline, start flexeril and exercises. Call with any concerns.    Relevant Orders   DG Lumbar Spine Complete       Follow up plan: Return in about 4 weeks (around 10/30/2019).   . This visit was completed via [employer] due to the restrictions of the COVID-19 pandemic. All issues as above were discussed and addressed. Physical exam was done as above through visual confirmation on D[employer]. If it was felt that the patient should be evaluated in the office, they were directed there. The patient verbally consented to  this visit. . Location of the patient: work . Location of the provider: home . Those involved with this call:  . Provider: Park Liter, DO . CMA: Tiffany Reel, CMA . Front Desk/Registration: Don Perking  . Time spent on call: 25 minutes with patient face to face via video conference. More than 50% of this time was spent in counseling and coordination of care. 40 minutes total spent in review of patient's record and preparation of their chart.

## 2019-10-02 NOTE — Assessment & Plan Note (Signed)
Will continue saxenda. Now off abilify (managed by psychiatry). We will refer to weight management- referral made today. Will try to get her legs better to allow for increased exercise. Call with any concerns.

## 2019-10-03 ENCOUNTER — Ambulatory Visit
Admission: RE | Admit: 2019-10-03 | Discharge: 2019-10-03 | Disposition: A | Discharge status: Home / Self Care | Payer: No Typology Code available for payment source | Source: Ambulatory Visit | Attending: Family Medicine | Admitting: Family Medicine

## 2019-10-03 DIAGNOSIS — M79605 Pain in left leg: Secondary | ICD-10-CM | POA: Insufficient documentation

## 2019-10-03 DIAGNOSIS — M5441 Lumbago with sciatica, right side: Secondary | ICD-10-CM

## 2019-10-03 DIAGNOSIS — M5442 Lumbago with sciatica, left side: Secondary | ICD-10-CM | POA: Diagnosis present

## 2019-10-03 DIAGNOSIS — M79604 Pain in right leg: Secondary | ICD-10-CM | POA: Diagnosis present

## 2019-10-15 ENCOUNTER — Telehealth: Payer: Self-pay

## 2019-10-15 NOTE — Telephone Encounter (Signed)
PA for Saxenda initiated and submitted via Cover My Meds.

## 2019-10-15 NOTE — Telephone Encounter (Signed)
Key: [id number]

## 2019-10-23 NOTE — Telephone Encounter (Signed)
PA approved.

## 2019-11-24 ENCOUNTER — Other Ambulatory Visit: Payer: Self-pay | Admitting: Family Medicine

## 2019-12-29 ENCOUNTER — Ambulatory Visit: Payer: No Typology Code available for payment source | Admitting: Family Medicine

## 2019-12-31 ENCOUNTER — Ambulatory Visit (INDEPENDENT_AMBULATORY_CARE_PROVIDER_SITE_OTHER): Payer: No Typology Code available for payment source | Admitting: Family Medicine

## 2019-12-31 ENCOUNTER — Encounter (INDEPENDENT_AMBULATORY_CARE_PROVIDER_SITE_OTHER): Payer: Self-pay | Admitting: Family Medicine

## 2019-12-31 ENCOUNTER — Other Ambulatory Visit: Payer: Self-pay

## 2019-12-31 VITALS — BP 123/80 | HR 78 | Temp 98.0°F | Ht 60.0 in | Wt 166.0 lb

## 2019-12-31 DIAGNOSIS — Z9189 Other specified personal risk factors, not elsewhere classified: Secondary | ICD-10-CM

## 2019-12-31 DIAGNOSIS — R7303 Prediabetes: Secondary | ICD-10-CM

## 2019-12-31 DIAGNOSIS — G43009 Migraine without aura, not intractable, without status migrainosus: Secondary | ICD-10-CM | POA: Diagnosis not present

## 2019-12-31 DIAGNOSIS — F418 Other specified anxiety disorders: Secondary | ICD-10-CM

## 2019-12-31 DIAGNOSIS — E559 Vitamin D deficiency, unspecified: Secondary | ICD-10-CM

## 2019-12-31 DIAGNOSIS — R5383 Other fatigue: Secondary | ICD-10-CM | POA: Diagnosis not present

## 2019-12-31 DIAGNOSIS — R0602 Shortness of breath: Secondary | ICD-10-CM

## 2019-12-31 DIAGNOSIS — E669 Obesity, unspecified: Secondary | ICD-10-CM

## 2019-12-31 DIAGNOSIS — Z6832 Body mass index (BMI) 32.0-32.9, adult: Secondary | ICD-10-CM

## 2019-12-31 DIAGNOSIS — E538 Deficiency of other specified B group vitamins: Secondary | ICD-10-CM

## 2019-12-31 DIAGNOSIS — Z0289 Encounter for other administrative examinations: Secondary | ICD-10-CM

## 2019-12-31 NOTE — Progress Notes (Signed)
Dear Dr. Wynetta Emery,   Thank you for referring Ashley Wiley to our clinic. The following note includes my evaluation and treatment recommendations.  Chief Complaint:   Ashley Wiley (MR# [id number]) is a 44 Wiley.o. female who presents for evaluation and treatment of obesity and related comorbidities. Ashley Wiley was referred to our clinic by her PCP Ashley Roys, DO. Ashley Wiley is on Saxenda 3 mg. Current BMI is Body mass index is 32.42 kg/m.Marland Kitchen Ashley Wiley has been struggling with her weight for many years and has been unsuccessful in either losing weight, maintaining weight loss, or reaching her healthy weight goal.  Ashley Wiley is currently in the action stage of change and ready to dedicate time achieving and maintaining a healthier weight. Ashley Wiley is interested in becoming our patient and working on intensive lifestyle modifications including (but not limited to) diet and exercise for weight loss.  Ashley Wiley's habits were reviewed today and are as follows: Her family eats meals together, she thinks her family will eat healthier with her, her desired weight loss is 21 pounds, she has been heavy most of her life, her heaviest weight ever was 166 pounds, she has significant food cravings issues, she snacks frequently in the evenings, she skips meals frequently, she is frequently drinking liquids with calories, she frequently makes poor food choices, she has problems with excessive hunger, she frequently eats larger portions than normal, she has binge eating behaviors and she struggles with emotional eating.  Ashley Wiley has coffee in the morning with french vanilla creamer. She eats more than 1 cup of cereal with 1% milk (feels satisfied). Lunch consists of 4 ounces of chicken, 1/2 cup of rice, salad and sweet tea (eats all and feels full). She has fruit for a snack (wants to eat). Dinner consists of 5 ounces of steak, pinto beans, and tortillas with soda. After dinner, she has mango and pumpkin  seeds.  Depression Screen Ashley Wiley's Food and Mood (modified PHQ-9) score was strongly positive.  Depression screen PHQ 2/9 12/31/2019  Decreased Interest 3  Down, Depressed, Hopeless 3  PHQ - 2 Score 6  Altered sleeping 3  Tired, decreased energy 3  Change in appetite 3  Feeling bad or failure about yourself  3  Trouble concentrating 3  Moving slowly or fidgety/restless 3  Suicidal thoughts 1  PHQ-9 Score 25  Difficult doing work/chores Very difficult  Some recent data might be hidden   Subjective:   Other fatigue  Ashley Wiley admits to daytime somnolence and admits to waking up still tired. Patent has a history of symptoms of daytime fatigue and morning fatigue. Ashley Wiley generally gets 8 hours of sleep per night, and states that she has generally restful sleep. Snoring is present. Apneic episodes are present. Epworth Sleepiness Score is 15. EKG ordered today shows normal sinus rhythm at 78 BPM.  Shortness of breath on exertion  Ashley Wiley notes increasing shortness of breath with exercising and seems to be worsening over time with weight gain. She notes getting out of breath sooner with activity than she used to. This has not gotten worse recently. Ashley Wiley denies shortness of breath at rest or orthopnea.  Prediabetes  Ashley Wiley was diagnosed with prediabetes approximately four years ago. She was informed this puts her at greater risk of developing diabetes. Ashley Wiley is on metformin 500 mg daily. Her last A1c was at 5.9 (06/27/19). She is attempting to work on diet and exercise to decrease her risk of diabetes.   Lab Results  Component Value Date  HGBA1C 5.9 06/27/2019   No results found for: INSULIN  Migraine without aura and without status migrainosus, not intractable Ashley Wiley sees her PCP for management. She is on Nortriptyline and Propranolol. She is only at most one migraine a month.  Depression with anxiety Ashley Wiley shows no sign of suicidal or homicidal ideations. She is on  100 mg of Sertraline since November 2020. Her symptoms are well controlled.  Vitamin D deficiency  Ashley Wiley's Vitamin D level was 21.0 on 06/27/19. She is not on vit D supplement. She admits fatigue. Ashley Wiley was previously on OTC vitamin D.  B12 nutritional deficiency  Ashley Wiley is not on B12 supplement. She is supposed to be taking OTC B12. Ashley Wiley admits fatigue.  At risk for hyperglycemia Ashley Wiley is at risk for hyperglycemia due to insulin resistance, prediabetes or diabetes.  Assessment/Plan:   Other fatigue  Ashley Wiley does feel that her weight is causing her energy to be lower than it should be. Fatigue may be related to obesity, depression or many other causes. Labs and EKG will be ordered, and in the meanwhile, Ashley Wiley will focus on self care including making healthy food choices, increasing physical activity and focusing on stress reduction.  Shortness of breath on exertion  Ashley Wiley does feel that she gets out of breath more easily that she used to when she exercises. Ashley Wiley's shortness of breath appears to be obesity related and exercise induced. She has agreed to work on weight loss and gradually increase exercise to treat her exercise induced shortness of breath. Labs and indirect calorimetry will be ordered today. We will continue to monitor closely.  Prediabetes  Ashley Wiley will begin to work on weight loss, exercise, and decreasing simple carbohydrates to help decrease the risk of diabetes. We will check Hgb A1c and insulin level today.  Migraine without aura and without status migrainosus, not intractable [name] will follow up with her PCP.  Depression with anxiety Ashley Wiley will decrease Zoloft per her PCP's guidance. We will refer patient to Dr. Mallie Mussel our bariatric psychologist.  Vitamin D deficiency  Low Vitamin D level contributes to fatigue and are associated with obesity, breast, and colon cancer. We will check vitamin D level today.  B12 nutritional deficiency    The diagnosis was reviewed with the patient. Counseling provided today, see below. We will continue to monitor. We will check B12 level today. Orders and follow up as documented in patient record.  Counseling . The body needs vitamin B12: to make red blood cells; to make DNA; and to help the nerves work properly so they can carry messages from the brain to the body.  . The main causes of vitamin B12 deficiency include dietary deficiency, digestive diseases, pernicious anemia, and having a surgery in which part of the stomach or small intestine is removed.  . Certain medicines can make it harder for the body to absorb vitamin B12. These medicines include: heartburn medications; some antibiotics; some medications used to treat diabetes, gout, and high cholesterol.  . In some cases, there are no symptoms of this condition. If the condition leads to anemia or nerve damage, various symptoms can occur, such as weakness or fatigue, shortness of breath, and numbness or tingling in your hands and feet.   . Treatment:  o May include taking vitamin B12 supplements.  o Avoid alcohol.  o Eat lots of healthy foods that contain vitamin B12: - Beef, pork, chicken, [place], and organ meats, such as liver.  - Seafood: This includes clams, rainbow trout, salmon, tuna,  and haddock. Eggs.  - Cereal and dairy products that are fortified: This means that vitamin B12 has been added to the food.   At risk for hyperglycemia Ashley Wiley was given approximately 15 minutes of counseling today regarding prevention of hyperglycemia. She was advised of hyperglycemia causes and the fact hyperglycemia is often asymptomatic. Ashley Wiley was instructed to avoid skipping meals, eat regular protein rich meals and schedule low calorie but protein rich snacks as needed.   Repetitive spaced learning was employed today to elicit superior memory formation and behavioral change  Class 1 obesity with serious comorbidity and body mass index (BMI)  of 32.0 to 32.9 in adult, unspecified obesity type Ashley Wiley is currently in the action stage of change and her goal is to continue with weight loss efforts. I recommend Ashley Wiley begin the structured treatment plan as follows:  She has agreed to the Category 3 Plan +100 calories.  Exercise goals: No exercise has been prescribed at this time.   Behavioral modification strategies: increasing lean protein intake, meal planning and cooking strategies, keeping healthy foods in the home and planning for success.  She was informed of the importance of frequent follow-up visits to maximize her success with intensive lifestyle modifications for her multiple health conditions. She was informed we would discuss her lab results at her next visit unless there is a critical issue that needs to be addressed sooner. Ashley Wiley agreed to keep her next visit at the agreed upon time to discuss these results.  Objective:   Blood pressure 123/80, pulse 78, temperature 98 F (36.7 C), temperature source Oral, height 5' (1.524 m), weight 166 lb (75.3 kg), last menstrual period 12/16/2019, SpO2 100 %. Body mass index is 32.42 kg/m.  EKG: Normal sinus rhythm, rate 78 BPM.  Indirect Calorimeter completed today shows a VO2 of 274 and a REE of 1904.  Her calculated basal metabolic rate is 0000000 thus her basal metabolic rate is better than expected.  General: Cooperative, alert, well developed, in no acute distress. HEENT: Conjunctivae and lids unremarkable. Cardiovascular: Regular rhythm.  Lungs: Normal work of breathing. Neurologic: No focal deficits.   Lab Results  Component Value Date   CREATININE 0.59 06/27/2019   BUN 8 06/27/2019   NA 135 06/27/2019   K 4.8 06/27/2019   CL 99 06/27/2019   CO2 22 06/27/2019   Lab Results  Component Value Date   ALT 25 06/27/2019   AST 19 06/27/2019   ALKPHOS 99 06/27/2019   BILITOT 0.5 06/27/2019   Lab Results  Component Value Date   HGBA1C 5.9 06/27/2019   HGBA1C  5.0 09/20/2018   HGBA1C 6.1 04/01/2018   HGBA1C 5.7 (H) 12/31/2017   HGBA1C 5.7 (H) 07/17/2017   No results found for: INSULIN Lab Results  Component Value Date   TSH 1.960 06/27/2019   Lab Results  Component Value Date   CHOL 191 06/27/2019   HDL 37 (L) 06/27/2019   LDLCALC Comment 06/27/2019   TRIG 450 (H) 06/27/2019   Lab Results  Component Value Date   WBC 7.3 06/27/2019   HGB 12.2 06/27/2019   HCT 37.0 06/27/2019   MCV 90 06/27/2019   PLT 218 06/27/2019   No results found for: IRON, TIBC, FERRITIN   Ref. Range 06/27/2019 09:50  Vitamin D, 25-Hydroxy Latest Ref Range: 30.0 - 100.0 ng/mL 21.0 (L)    Attestation Statements:   Reviewed by clinician on day of visit: allergies, medications, problem list, medical history, surgical history, family history, social history, and  previous encounter notes.  I, Doreene Nest, am acting as transcriptionist for Eber Jones, MD.  I have reviewed the above documentation for accuracy and completeness, and I agree with the above. - Ilene Qua, MD

## 2020-01-01 LAB — COMPREHENSIVE METABOLIC PANEL
ALT: 73 IU/L — ABNORMAL HIGH (ref 0–32)
AST: 60 IU/L — ABNORMAL HIGH (ref 0–40)
Albumin/Globulin Ratio: 1.7 (ref 1.2–2.2)
Albumin: 4.3 g/dL (ref 3.8–4.8)
Alkaline Phosphatase: 99 IU/L (ref 39–117)
BUN/Creatinine Ratio: 15 (ref 9–23)
BUN: 9 mg/dL (ref 6–24)
Bilirubin Total: 0.4 mg/dL (ref 0.0–1.2)
CO2: 21 mmol/L (ref 20–29)
Calcium: 9.1 mg/dL (ref 8.7–10.2)
Chloride: 96 mmol/L (ref 96–106)
Creatinine, Ser: 0.59 mg/dL (ref 0.57–1.00)
GFR calc Af Amer: 131 mL/min/{1.73_m2} (ref >59)
GFR calc non Af Amer: 113 mL/min/{1.73_m2} (ref >59)
Globulin, Total: 2.6 g/dL (ref 1.5–4.5)
Glucose: 76 mg/dL (ref 65–99)
Potassium: 4.3 mmol/L (ref 3.5–5.2)
Sodium: 135 mmol/L (ref 134–144)
Total Protein: 6.9 g/dL (ref 6.0–8.5)

## 2020-01-01 LAB — LIPID PANEL WITH LDL/HDL RATIO
Cholesterol, Total: 228 mg/dL — ABNORMAL HIGH (ref 100–199)
HDL: 45 mg/dL (ref >39)
LDL Chol Calc (NIH): 144 mg/dL — ABNORMAL HIGH (ref 0–99)
LDL/HDL Ratio: 3.2 ratio (ref 0.0–3.2)
Triglycerides: 213 mg/dL — ABNORMAL HIGH (ref 0–149)
VLDL Cholesterol Cal: 39 mg/dL (ref 5–40)

## 2020-01-01 LAB — CBC WITH DIFFERENTIAL/PLATELET
Basophils Absolute: 0 10*3/uL (ref 0.0–0.2)
Basos: 1 %
EOS (ABSOLUTE): 0.2 10*3/uL (ref 0.0–0.4)
Eos: 3 %
Hematocrit: 40.1 % (ref 34.0–46.6)
Hemoglobin: 13.4 g/dL (ref 11.1–15.9)
Immature Grans (Abs): 0.1 10*3/uL (ref 0.0–0.1)
Immature Granulocytes: 1 %
Lymphocytes Absolute: 2.3 10*3/uL (ref 0.7–3.1)
Lymphs: 29 %
MCH: 30.9 pg (ref 26.6–33.0)
MCHC: 33.4 g/dL (ref 31.5–35.7)
MCV: 93 fL (ref 79–97)
Monocytes Absolute: 0.5 10*3/uL (ref 0.1–0.9)
Monocytes: 7 %
Neutrophils Absolute: 4.7 10*3/uL (ref 1.4–7.0)
Neutrophils: 59 %
Platelets: 211 10*3/uL (ref 150–450)
RBC: 4.33 x10E6/uL (ref 3.77–5.28)
RDW: 12.4 % (ref 11.7–15.4)
WBC: 7.9 10*3/uL (ref 3.4–10.8)

## 2020-01-01 LAB — T4, FREE: Free T4: 0.98 ng/dL (ref 0.82–1.77)

## 2020-01-01 LAB — HEMOGLOBIN A1C
Est. average glucose Bld gHb Est-mCnc: 123 mg/dL
Hgb A1c MFr Bld: 5.9 % — ABNORMAL HIGH (ref 4.8–5.6)

## 2020-01-01 LAB — VITAMIN D 25 HYDROXY (VIT D DEFICIENCY, FRACTURES): Vit D, 25-Hydroxy: 13.8 ng/mL — ABNORMAL LOW (ref 30.0–100.0)

## 2020-01-01 LAB — VITAMIN B12: Vitamin B-12: 700 pg/mL (ref 232–1245)

## 2020-01-01 LAB — INSULIN, RANDOM: INSULIN: 10.4 u[IU]/mL (ref 2.6–24.9)

## 2020-01-01 LAB — TSH: TSH: 1.7 u[IU]/mL (ref 0.450–4.500)

## 2020-01-01 LAB — T3: T3, Total: 111 ng/dL (ref 71–180)

## 2020-01-01 LAB — FOLATE: Folate: 16.2 ng/mL (ref >3.0)

## 2020-01-14 ENCOUNTER — Other Ambulatory Visit: Payer: Self-pay

## 2020-01-14 ENCOUNTER — Ambulatory Visit (INDEPENDENT_AMBULATORY_CARE_PROVIDER_SITE_OTHER): Payer: No Typology Code available for payment source | Admitting: Family Medicine

## 2020-01-14 ENCOUNTER — Encounter (INDEPENDENT_AMBULATORY_CARE_PROVIDER_SITE_OTHER): Payer: Self-pay | Admitting: Family Medicine

## 2020-01-14 VITALS — BP 134/83 | HR 83 | Temp 98.9°F | Ht 60.0 in | Wt 166.0 lb

## 2020-01-14 DIAGNOSIS — E669 Obesity, unspecified: Secondary | ICD-10-CM

## 2020-01-14 DIAGNOSIS — E559 Vitamin D deficiency, unspecified: Secondary | ICD-10-CM

## 2020-01-14 DIAGNOSIS — R7303 Prediabetes: Secondary | ICD-10-CM | POA: Diagnosis not present

## 2020-01-14 DIAGNOSIS — R7989 Other specified abnormal findings of blood chemistry: Secondary | ICD-10-CM | POA: Diagnosis not present

## 2020-01-14 DIAGNOSIS — Z9189 Other specified personal risk factors, not elsewhere classified: Secondary | ICD-10-CM

## 2020-01-14 DIAGNOSIS — Z6832 Body mass index (BMI) 32.0-32.9, adult: Secondary | ICD-10-CM

## 2020-01-14 MED ORDER — VITAMIN D (ERGOCALCIFEROL) 1.25 MG (50000 UNIT) PO CAPS: 50000.0000 [IU] | ORAL_CAPSULE | ORAL | 0 refills | Status: DC

## 2020-01-15 ENCOUNTER — Encounter (INDEPENDENT_AMBULATORY_CARE_PROVIDER_SITE_OTHER): Payer: Self-pay | Admitting: Family Medicine

## 2020-01-15 NOTE — Telephone Encounter (Signed)
Please advise 

## 2020-01-15 NOTE — Progress Notes (Signed)
Chief Complaint:   Ashley Wiley is here to discuss her progress with her obesity treatment plan along with follow-up of her obesity related diagnoses. Ashley Wiley is on the Category 3 Plan and states she is following her eating plan approximately 70% of the time. Ashley Wiley states she is walking 5,000 to 6,000 steps, 5 times per week.  Today's visit was #: 2 Starting weight: 166 lbs Starting date: 12/31/2019 Today's weight: 166 lbs Today's date: 01/14/2020 Total lbs lost to date: 0 Total lbs lost since last in-office visit: 0  Interim History: Ashley Wiley liked the plan for the ost part. Ashley Wiley is doing [employer] and coffee with sugar free creamer for breakfast. For lunch, patient is doing carb counter tortilla and [place] with veggies and diet coke. She has a greek yogurt for her snack and sugar free jello. Dinner consists of tilapia with rice (2 small spoons), 2 corn tortilla's and veggies. Ashley Wiley is doing fruit for snacks.  Subjective:   Vitamin D deficiency  Ashley Wiley's Vitamin D level was 13.8 on 12/31/19. She is not currently taking vit D. She admits fatigue.    Prediabetes Ashley Wiley has a diagnosis of prediabetes based on her elevated Hgb A1c and was informed this puts her at greater risk of developing diabetes. She continues to work on diet and exercise to decrease her risk of diabetes. She denies nausea or hypoglycemia.  Lab Results  Component Value Date   HGBA1C 5.9 (H) 12/31/2019   Lab Results  Component Value Date   INSULIN 10.4 12/31/2019   Elevated LFTs Ashley Wiley had abdominal ultrasound in 2014 and 2011, showing normal echogenicity. LFT's were previously normal in August 2020.   Lab Results  Component Value Date   ALT 73 (H) 12/31/2019   AST 60 (H) 12/31/2019   ALKPHOS 99 12/31/2019   BILITOT 0.4 12/31/2019   At risk for impaired function of liver Ashley Wiley is at risk for impaired function of liver due to likely diagnosis of fatty liver as  evidenced by recent elevated liver enzymes.   Assessment/Plan:   Vitamin D deficiency Low Vitamin D level contributes to fatigue and are associated with obesity, breast, and colon cancer. Cynithia agrees to start prescription Vitamin D @50 ,000 IU every week #4 with no refills and she will follow-up for routine testing of Vitamin D, at least 2-3 times per year to avoid over-replacement.  Prediabetes [place] will continue to work on weight loss, exercise, and decreasing simple carbohydrates to help decrease the risk of diabetes. We will follow up at the next appointment and if hunger or sugar cravings increase, we will increase metformin.  Elevated LFTs  We discussed the likely diagnosis of non-alcoholic fatty liver disease today and how this condition is obesity related. Ashley Wiley was educated the importance of weight loss. Ashley Wiley agreed to continue with her weight loss efforts with healthier diet and exercise as an essential part of her treatment. US Abdomen Limited RUQ will be ordered.  At risk for impaired function of liver Ashley Wiley was given approximately 15 minutes of counseling today regarding prevention of impaired liver function. Ashley Wiley was educated about her risk of developing NASH or even liver failure and advised that the only proven treatment for NAFLD was weight loss of at least 5-10% of body weight.   Class 1 obesity with serious comorbidity and body mass index (BMI) of 32.0 to 32.9 in adult, unspecified obesity type Ashley Wiley is currently in the action stage of change. As such, her goal  is to continue with weight loss efforts. She has agreed to the Category 3 Plan +100 calories.   Exercise goals: Ashley Wiley will continue her current exercise regimen.  Behavioral modification strategies: increasing lean protein intake, increasing vegetables, meal planning and cooking strategies, keeping healthy foods in the home and planning for success.  We discussed various medication options  to help Ashley Wiley with her weight loss efforts and we both agreed to stop Saxenda (patient reports significant injection site pain). Ashley Wiley will increase her breakfast meal to include greek yogurt, in addition to [employer].  Ashley Wiley has agreed to follow-up with our clinic in 2 weeks. She was informed of the importance of frequent follow-up visits to maximize her success with intensive lifestyle modifications for her multiple health conditions.   Objective:   Blood pressure 134/83, pulse 83, temperature 98.9 F (37.2 C), temperature source Oral, height 5' (1.524 m), weight 166 lb (75.3 kg), last menstrual period 12/12/2019, SpO2 97 %. Body mass index is 32.42 kg/m.  General: Cooperative, alert, well developed, in no acute distress. HEENT: Conjunctivae and lids unremarkable. Cardiovascular: Regular rhythm.  Lungs: Normal work of breathing. Neurologic: No focal deficits.   Lab Results  Component Value Date   CREATININE 0.59 12/31/2019   BUN 9 12/31/2019   NA 135 12/31/2019   K 4.3 12/31/2019   CL 96 12/31/2019   CO2 21 12/31/2019   Lab Results  Component Value Date   ALT 73 (H) 12/31/2019   AST 60 (H) 12/31/2019   ALKPHOS 99 12/31/2019   BILITOT 0.4 12/31/2019   Lab Results  Component Value Date   HGBA1C 5.9 (H) 12/31/2019   HGBA1C 5.9 06/27/2019   HGBA1C 5.0 09/20/2018   HGBA1C 6.1 04/01/2018   HGBA1C 5.7 (H) 12/31/2017   Lab Results  Component Value Date   INSULIN 10.4 12/31/2019   Lab Results  Component Value Date   TSH 1.700 12/31/2019   Lab Results  Component Value Date   CHOL 228 (H) 12/31/2019   HDL 45 12/31/2019   LDLCALC 144 (H) 12/31/2019   TRIG 213 (H) 12/31/2019   Lab Results  Component Value Date   WBC 7.9 12/31/2019   HGB 13.4 12/31/2019   HCT 40.1 12/31/2019   MCV 93 12/31/2019   PLT 211 12/31/2019   No results found for: IRON, TIBC, FERRITIN   Ref. Range 12/31/2019 11:50  Vitamin D, 25-Hydroxy Latest Ref Range: 30.0 - 100.0  ng/mL 13.8 (L)    Attestation Statements:   Reviewed by clinician on day of visit: allergies, medications, problem list, medical history, surgical history, family history, social history, and previous encounter notes.  I, Doreene Nest, am acting as transcriptionist for Coralie Common, MD.  I have reviewed the above documentation for accuracy and completeness, and I agree with the above. - Ilene Qua, MD

## 2020-01-20 ENCOUNTER — Encounter: Payer: Self-pay | Admitting: Family Medicine

## 2020-01-22 ENCOUNTER — Encounter (INDEPENDENT_AMBULATORY_CARE_PROVIDER_SITE_OTHER): Payer: Self-pay | Admitting: Family Medicine

## 2020-01-22 ENCOUNTER — Encounter: Payer: Self-pay | Admitting: Family Medicine

## 2020-01-25 ENCOUNTER — Other Ambulatory Visit: Payer: Self-pay | Admitting: Family Medicine

## 2020-01-25 MED ORDER — METFORMIN HCL ER 500 MG PO TB24: 1000.0000 mg | ORAL_TABLET | Freq: Every day | ORAL | 0 refills | Status: DC

## 2020-01-29 ENCOUNTER — Other Ambulatory Visit: Payer: Self-pay

## 2020-01-29 ENCOUNTER — Ambulatory Visit (INDEPENDENT_AMBULATORY_CARE_PROVIDER_SITE_OTHER): Payer: No Typology Code available for payment source | Admitting: Family Medicine

## 2020-01-29 ENCOUNTER — Encounter (INDEPENDENT_AMBULATORY_CARE_PROVIDER_SITE_OTHER): Payer: Self-pay | Admitting: Family Medicine

## 2020-01-29 VITALS — BP 123/74 | HR 88 | Temp 98.5°F | Ht 60.0 in | Wt 167.0 lb

## 2020-01-29 DIAGNOSIS — E669 Obesity, unspecified: Secondary | ICD-10-CM | POA: Diagnosis not present

## 2020-01-29 DIAGNOSIS — R7303 Prediabetes: Secondary | ICD-10-CM | POA: Diagnosis not present

## 2020-01-29 DIAGNOSIS — Z6832 Body mass index (BMI) 32.0-32.9, adult: Secondary | ICD-10-CM

## 2020-01-29 DIAGNOSIS — F3289 Other specified depressive episodes: Secondary | ICD-10-CM | POA: Diagnosis not present

## 2020-01-30 ENCOUNTER — Ambulatory Visit
Admission: RE | Admit: 2020-01-30 | Discharge: 2020-01-30 | Disposition: A | Discharge status: Home / Self Care | Payer: No Typology Code available for payment source | Source: Ambulatory Visit | Attending: Family Medicine | Admitting: Family Medicine

## 2020-01-30 DIAGNOSIS — R7989 Other specified abnormal findings of blood chemistry: Secondary | ICD-10-CM | POA: Diagnosis not present

## 2020-02-02 NOTE — Progress Notes (Signed)
Chief Complaint:   OBESITY Ashley Wiley is here to discuss her progress with her obesity treatment plan along with follow-up of her obesity related diagnoses. Ashley Wiley is on the Category 3 Plan + 100 calories and states she is following her eating plan approximately 50% of the time. Ashley Wiley states she is walking for 30 minutes 5 times per week.  Today's visit was #: 3 Starting weight: 166 lbs Starting date: 12/31/2019 Today's weight: 167 lbs Today's date: 01/29/2020 Total lbs lost to date: 0 Total lbs lost since last in-office visit: 0  Interim History: Ashley Wiley had a stressful few weeks. Her daughter moved, she decreased metformin to 1 time per day, and she stopped [place]. She notices increase in hunger with stopping Saxenda. She is doing 1/2 cup of oatmeal and some granola. For lunch, she is eating at the office with catered food. For dinner she is still eating some carbohydrates with protein.  Subjective:   1. Pre-diabetes Ashley Wiley's last Hgb A1c was 5.9 and insulin of 10.4. she is on metformin 1,000 mg again. Her blood sugars are up to 160 with cassation of Saxenda.  2. Other depression, with emotional eating  Ashley Wiley denies suicidal ideas or homicidal ideas. She saw her Psychiatrist yesterday. She wanted to decrease her medications but she has decided not to.  Assessment/Plan:   1. Pre-diabetes Ashley Wiley will continue to work on weight loss, exercise, and decreasing simple carbohydrates to help decrease the risk of diabetes. We will repeat labs in 2 months.  2. Other depression, with emotional eating  Behavior modification techniques were discussed today to help Ashley Wiley deal with her emotional/non-hunger eating behaviors. Ashley Wiley will follow up with Dr. Jake Michaelis in 3 months. She is to discuss with her Psychiatrist about possible EMDR, ECT, or [place]. Orders and follow up as documented in patient record.   3. Class 1 obesity with serious comorbidity and body mass index (BMI) of  32.0 to 32.9 in adult, unspecified obesity type Ashley Wiley is currently in the action stage of change. As such, her goal is to continue with weight loss efforts. She has agreed to keeping a food journal and adhering to recommended goals of 1500-1600 calories and 95+ grams of protein daily.   Exercise goals: As is.  Behavioral modification strategies: increasing lean protein intake, increasing vegetables, meal planning and cooking strategies, keeping healthy foods in the home and planning for success.  Ashley Wiley has agreed to follow-up with our clinic in 2 weeks. She was informed of the importance of frequent follow-up visits to maximize her success with intensive lifestyle modifications for her multiple health conditions.   Objective:   Blood pressure 123/74, pulse 88, temperature 98.5 F (36.9 C), temperature source Oral, height 5' (1.524 m), weight 167 lb (75.8 kg), SpO2 97 %. Body mass index is 32.61 kg/m.  General: Cooperative, alert, well developed, in no acute distress. HEENT: Conjunctivae and lids unremarkable. Cardiovascular: Regular rhythm.  Lungs: Normal work of breathing. Neurologic: No focal deficits.   Lab Results  Component Value Date   CREATININE 0.59 12/31/2019   BUN 9 12/31/2019   NA 135 12/31/2019   K 4.3 12/31/2019   CL 96 12/31/2019   CO2 21 12/31/2019   Lab Results  Component Value Date   ALT 73 (H) 12/31/2019   AST 60 (H) 12/31/2019   ALKPHOS 99 12/31/2019   BILITOT 0.4 12/31/2019   Lab Results  Component Value Date   HGBA1C 5.9 (H) 12/31/2019   HGBA1C 5.9 06/27/2019   HGBA1C  5.0 09/20/2018   HGBA1C 6.1 04/01/2018   HGBA1C 5.7 (H) 12/31/2017   Lab Results  Component Value Date   INSULIN 10.4 12/31/2019   Lab Results  Component Value Date   TSH 1.700 12/31/2019   Lab Results  Component Value Date   CHOL 228 (H) 12/31/2019   HDL 45 12/31/2019   LDLCALC 144 (H) 12/31/2019   TRIG 213 (H) 12/31/2019   Lab Results  Component Value Date   WBC  7.9 12/31/2019   HGB 13.4 12/31/2019   HCT 40.1 12/31/2019   MCV 93 12/31/2019   PLT 211 12/31/2019   No results found for: IRON, TIBC, FERRITIN  Attestation Statements:   Reviewed by clinician on day of visit: allergies, medications, problem list, medical history, surgical history, family history, social history, and previous encounter notes.  Time spent on visit including pre-visit chart review and post-visit care and charting was 14 minutes.    I, Trixie Dredge, am acting as transcriptionist for Ilene Qua, MD.  I have reviewed the above documentation for accuracy and completeness, and I agree with the above. - Ilene Qua, MD

## 2020-02-12 ENCOUNTER — Ambulatory Visit (INDEPENDENT_AMBULATORY_CARE_PROVIDER_SITE_OTHER): Payer: No Typology Code available for payment source | Admitting: Family Medicine

## 2020-02-26 ENCOUNTER — Other Ambulatory Visit (INDEPENDENT_AMBULATORY_CARE_PROVIDER_SITE_OTHER): Payer: Self-pay | Admitting: Family Medicine

## 2020-02-26 DIAGNOSIS — E559 Vitamin D deficiency, unspecified: Secondary | ICD-10-CM

## 2020-02-27 ENCOUNTER — Encounter (INDEPENDENT_AMBULATORY_CARE_PROVIDER_SITE_OTHER): Payer: Self-pay | Admitting: Family Medicine

## 2020-03-02 ENCOUNTER — Ambulatory Visit (INDEPENDENT_AMBULATORY_CARE_PROVIDER_SITE_OTHER): Payer: No Typology Code available for payment source | Admitting: Family Medicine

## 2020-03-30 ENCOUNTER — Other Ambulatory Visit: Payer: Self-pay

## 2020-03-30 ENCOUNTER — Other Ambulatory Visit: Payer: Self-pay | Admitting: Family Medicine

## 2020-03-30 ENCOUNTER — Encounter: Payer: Self-pay | Admitting: Family Medicine

## 2020-03-30 ENCOUNTER — Ambulatory Visit (INDEPENDENT_AMBULATORY_CARE_PROVIDER_SITE_OTHER): Payer: No Typology Code available for payment source | Admitting: Family Medicine

## 2020-03-30 VITALS — BP 142/92 | HR 79 | Temp 98.2°F | Ht 59.0 in | Wt 174.2 lb

## 2020-03-30 DIAGNOSIS — E559 Vitamin D deficiency, unspecified: Secondary | ICD-10-CM | POA: Diagnosis not present

## 2020-03-30 DIAGNOSIS — R7301 Impaired fasting glucose: Secondary | ICD-10-CM | POA: Diagnosis not present

## 2020-03-30 DIAGNOSIS — E782 Mixed hyperlipidemia: Secondary | ICD-10-CM

## 2020-03-30 DIAGNOSIS — E1165 Type 2 diabetes mellitus with hyperglycemia: Secondary | ICD-10-CM | POA: Diagnosis not present

## 2020-03-30 DIAGNOSIS — Z6833 Body mass index (BMI) 33.0-33.9, adult: Secondary | ICD-10-CM

## 2020-03-30 DIAGNOSIS — E6609 Other obesity due to excess calories: Secondary | ICD-10-CM

## 2020-03-30 LAB — MICROALBUMIN, URINE WAIVED
Creatinine, Urine Waived: 10 mg/dL (ref 10–300)
Microalb, Ur Waived: 10 mg/L (ref 0–19)
Microalb/Creat Ratio: <30 mg/g (ref <30)

## 2020-03-30 LAB — BAYER DCA HB A1C WAIVED: HB A1C (BAYER DCA - WAIVED): 6.6 % (ref <7.0)

## 2020-03-30 MED ORDER — NAPROXEN 500 MG PO TABS: 500.0000 mg | ORAL_TABLET | Freq: Two times a day (BID) | ORAL | 1 refills | Status: DC

## 2020-03-30 MED ORDER — FLUTICASONE PROPIONATE 50 MCG/ACT NA SUSP: 2.0000 | Freq: Every day | NASAL | 12 refills | Status: DC | PRN

## 2020-03-30 MED ORDER — PROPRANOLOL HCL ER 80 MG PO CP24: 80.0000 mg | ORAL_CAPSULE | Freq: Every day | ORAL | 1 refills | Status: DC

## 2020-03-30 MED ORDER — CICLOPIROX 8 % EX SOLN: Freq: Every day | CUTANEOUS | 3 refills | Status: DC

## 2020-03-30 MED ORDER — CYCLOBENZAPRINE HCL 10 MG PO TABS: 10.0000 mg | ORAL_TABLET | Freq: Every day | ORAL | 1 refills | Status: DC

## 2020-03-30 MED ORDER — METFORMIN HCL ER 500 MG PO TB24: 1000.0000 mg | ORAL_TABLET | Freq: Two times a day (BID) | ORAL | 1 refills | Status: DC

## 2020-03-30 MED ORDER — NORTRIPTYLINE HCL 50 MG PO CAPS: 50.0000 mg | ORAL_CAPSULE | Freq: Every day | ORAL | 1 refills | Status: DC

## 2020-03-30 NOTE — Progress Notes (Signed)
BP (!) 142/92 (BP Location: Left Arm, Patient Position: Sitting, Cuff Size: Normal)   Pulse 79   Temp 98.2 F (36.8 C) (Oral)   Ht 4\' 11"  (1.499 m)   Wt 174 lb 3.2 oz (79 kg)   LMP 03/26/2020 (Exact Date)   SpO2 99%   BMI 35.18 kg/m    Subjective:    Patient ID: Ashley Wiley, female    DOB: Sep 08, 1976, 44 y.o.   MRN: [id number]  HPI: Ashley Wiley is a 44 y.o. female  Chief Complaint  Patient presents with  . IFG  . Hyperlipidemia   Impaired Fasting Glucose HbA1C:  Lab Results  Component Value Date   HGBA1C 6.6 03/30/2020   Duration of elevated blood sugar: chronic Polydipsia: no Polyuria: no Weight change: no Visual disturbance: no Glucose Monitoring: yes    Accucheck frequency: Daily    Fasting glucose: "high" 150s Diabetic Education: Not Completed Family history of diabetes: yes  HYPERLIPIDEMIA Hyperlipidemia status: stable Satisfied with current treatment?  yes Side effects:  Not on anything Supplements: none Aspirin:  no The 10-year ASCVD risk score Mikey Bussing DC Jr., et al., 2013) is: 3.7%   Values used to calculate the score:     Age: 75 years     Sex: Female     Is Non-Hispanic African American: No     Diabetic: Yes     Tobacco smoker: No     Systolic Blood Pressure: [id number] mmHg     Is BP treated: No     HDL Cholesterol: 39 mg/dL     Total Cholesterol: 231 mg/dL Chest pain:  no  OBESITY- saw weight management in February. Stopped her saxenda at that point and had her metformin increased. She has not seen them since March and has not lost any weight, has been doing protein shakes 2x a day and dinner, has cut back on sweets and is being more active. She was not happy with the weight management clinic.  Duration:chronic  Previous attempts at weight loss: yes Complications of obesity:  Peak weight: current (174) Weight loss goal:  Weight loss to date:  Requesting obesity pharmacotherapy: no Current weight loss supplements/medications:  no Previous weight loss supplements/meds: yes   Relevant past medical, surgical, family and social history reviewed and updated as indicated. Interim medical history since our last visit reviewed. Allergies and medications reviewed and updated.  Review of Systems  Constitutional: Negative.   HENT: Negative.   Respiratory: Negative.   Cardiovascular: Negative.   Gastrointestinal: Negative.   Genitourinary: Negative.   Musculoskeletal: Positive for back pain and myalgias. Negative for arthralgias, gait problem, joint swelling, neck pain and neck stiffness.  Allergic/Immunologic: Positive for environmental allergies. Negative for food allergies and immunocompromised state.  Psychiatric/Behavioral: Negative.     Per HPI unless specifically indicated above     Objective:    BP (!) 142/92 (BP Location: Left Arm, Patient Position: Sitting, Cuff Size: Normal)   Pulse 79   Temp 98.2 F (36.8 C) (Oral)   Ht 4\' 11"  (1.499 m)   Wt 174 lb 3.2 oz (79 kg)   LMP 03/26/2020 (Exact Date)   SpO2 99%   BMI 35.18 kg/m   Wt Readings from Last 3 Encounters:  03/30/20 174 lb 3.2 oz (79 kg)  01/29/20 167 lb (75.8 kg)  01/14/20 166 lb (75.3 kg)    Physical Exam Vitals and nursing note reviewed.  Constitutional:      General: She is not in acute distress.  Appearance: Normal appearance. She is obese. She is not ill-appearing, toxic-appearing or diaphoretic.  HENT:     Head: Normocephalic and atraumatic.     Right Ear: External ear normal.     Left Ear: External ear normal.     Nose: Nose normal.     Mouth/Throat:     Mouth: Mucous membranes are moist.     Pharynx: Oropharynx is clear.  Eyes:     General: No scleral icterus.       Right eye: No discharge.        Left eye: No discharge.     Extraocular Movements: Extraocular movements intact.     Conjunctiva/sclera: Conjunctivae normal.     Pupils: Pupils are equal, round, and reactive to light.  Cardiovascular:     Rate and Rhythm:  Normal rate and regular rhythm.     Pulses: Normal pulses.     Heart sounds: Normal heart sounds. No murmur. No friction rub. No gallop.   Pulmonary:     Effort: Pulmonary effort is normal. No respiratory distress.     Breath sounds: Normal breath sounds. No stridor. No wheezing, rhonchi or rales.  Chest:     Chest wall: No tenderness.  Musculoskeletal:        General: Normal range of motion.     Cervical back: Normal range of motion and neck supple.  Skin:    General: Skin is warm and dry.     Capillary Refill: Capillary refill takes less than 2 seconds.     Coloration: Skin is not jaundiced or pale.     Findings: No bruising, erythema, lesion or rash.  Neurological:     General: No focal deficit present.     Mental Status: She is alert and oriented to person, place, and time. Mental status is at baseline.  Psychiatric:        Mood and Affect: Mood normal.        Behavior: Behavior normal.        Thought Content: Thought content normal.        Judgment: Judgment normal.     Results for orders placed or performed in visit on 03/30/20  Bayer DCA Hb A1c Waived  Result Value Ref Range   HB A1C (BAYER DCA - WAIVED) 6.6 <7.0 %  CBC with Differential/Platelet  Result Value Ref Range   WBC 6.3 3.4 - 10.8 x10E3/uL   RBC 4.02 3.77 - 5.28 x10E6/uL   Hemoglobin 12.4 11.1 - 15.9 g/dL   Hematocrit 37.0 34.0 - 46.6 %   MCV 92 79 - 97 fL   MCH 30.8 26.6 - 33.0 pg   MCHC 33.5 31.5 - 35.7 g/dL   RDW 12.9 11.7 - 15.4 %   Platelets 179 150 - 450 x10E3/uL   Neutrophils 54 Not Estab. %   Lymphs 31 Not Estab. %   Monocytes 7 Not Estab. %   Eos 6 Not Estab. %   Basos 1 Not Estab. %   Neutrophils Absolute 3.5 1.4 - 7.0 x10E3/uL   Lymphocytes Absolute 1.9 0.7 - 3.1 x10E3/uL   Monocytes Absolute 0.4 0.1 - 0.9 x10E3/uL   EOS (ABSOLUTE) 0.4 0.0 - 0.4 x10E3/uL   Basophils Absolute 0.0 0.0 - 0.2 x10E3/uL   Immature Granulocytes 1 Not Estab. %   Immature Grans (Abs) 0.0 0.0 - 0.1 x10E3/uL    Comprehensive metabolic panel  Result Value Ref Range   Glucose 180 (H) 65 - 99 mg/dL   BUN 11 6 -  24 mg/dL   Creatinine, Ser 0.68 0.57 - 1.00 mg/dL   GFR calc non Af Amer 108 >59 mL/min/1.73   GFR calc Af Amer 125 >59 mL/min/1.73   BUN/Creatinine Ratio 16 9 - 23   Sodium 135 134 - 144 mmol/L   Potassium 4.2 3.5 - 5.2 mmol/L   Chloride 95 (L) 96 - 106 mmol/L   CO2 27 20 - 29 mmol/L   Calcium 9.3 8.7 - 10.2 mg/dL   Total Protein 6.6 6.0 - 8.5 g/dL   Albumin 4.4 3.8 - 4.8 g/dL   Globulin, Total 2.2 1.5 - 4.5 g/dL   Albumin/Globulin Ratio 2.0 1.2 - 2.2   Bilirubin Total 0.4 0.0 - 1.2 mg/dL   Alkaline Phosphatase 107 39 - 117 IU/L   AST 150 (H) 0 - 40 IU/L   ALT 174 (H) 0 - 32 IU/L  Lipid Panel w/o Chol/HDL Ratio  Result Value Ref Range   Cholesterol, Total 231 (H) 100 - 199 mg/dL   Triglycerides 260 (H) 0 - 149 mg/dL   HDL 39 (L) >39 mg/dL   VLDL Cholesterol Cal 47 (H) 5 - 40 mg/dL   LDL Chol Calc (NIH) 145 (H) 0 - 99 mg/dL  VITAMIN D 25 Hydroxy (Vit-D Deficiency, Fractures)  Result Value Ref Range   Vit D, 25-Hydroxy 18.0 (L) 30.0 - 100.0 ng/mL  Microalbumin, Urine Waived  Result Value Ref Range   Microalb, Ur Waived 10 0 - 19 mg/L   Creatinine, Urine Waived 10 10 - 300 mg/dL   Microalb/Creat Ratio <30 <30 mg/g      Assessment & Plan:   Problem List Items Addressed This Visit      Endocrine   Type 2 diabetes mellitus with hyperglycemia, without long-term current use of insulin ([place]) - Primary    Newly diagnosed today. Will increase her metformin to 1000mg  BID and recheck 3 months. Will hold on ACE and statin right now. Checking labs. Await results.        Relevant Medications   metFORMIN (GLUCOPHAGE-XR) 500 MG 24 hr tablet   RESOLVED: IFG (impaired fasting glucose)    A1c 6.6. Diabetes. Resolving IFG off her list.       Relevant Orders   Bayer DCA Hb A1c Waived (Completed)   Microalbumin, Urine Waived (Completed)     Other   Hyperlipidemia    Rechecking  levels today. Await results. Treat as needed.       Relevant Medications   propranolol ER (INDERAL LA) 80 MG 24 hr capsule   Other Relevant Orders   Comprehensive metabolic panel (Completed)   Lipid Panel w/o Chol/HDL Ratio (Completed)   Vitamin D deficiency disease    Rechecking labs today. Await results.       Relevant Orders   VITAMIN D 25 Hydroxy (Vit-D Deficiency, Fractures) (Completed)   Obesity    Does not want to go back to the weight management clinic. Will increase her metformin and recheck 3 months. Call with any concerns.       Relevant Medications   metFORMIN (GLUCOPHAGE-XR) 500 MG 24 hr tablet   Other Relevant Orders   CBC with Differential/Platelet (Completed)       Follow up plan: Return August, for Physical.

## 2020-03-30 NOTE — Assessment & Plan Note (Signed)
Rechecking levels today. Await results. Treat as needed.  

## 2020-03-30 NOTE — Assessment & Plan Note (Signed)
Newly diagnosed today. Will increase her metformin to 1000mg  BID and recheck 3 months. Will hold on ACE and statin right now. Checking labs. Await results.

## 2020-03-30 NOTE — Assessment & Plan Note (Signed)
A1c 6.6. Diabetes. Resolving IFG off her list.

## 2020-03-30 NOTE — Assessment & Plan Note (Signed)
Rechecking labs today. Await results.  

## 2020-03-30 NOTE — Assessment & Plan Note (Signed)
Does not want to go back to the weight management clinic. Will increase her metformin and recheck 3 months. Call with any concerns.

## 2020-03-30 NOTE — Patient Instructions (Signed)

## 2020-03-31 ENCOUNTER — Encounter: Payer: Self-pay | Admitting: Family Medicine

## 2020-03-31 ENCOUNTER — Other Ambulatory Visit: Payer: Self-pay | Admitting: Family Medicine

## 2020-03-31 DIAGNOSIS — E559 Vitamin D deficiency, unspecified: Secondary | ICD-10-CM

## 2020-03-31 LAB — COMPREHENSIVE METABOLIC PANEL
ALT: 174 IU/L — ABNORMAL HIGH (ref 0–32)
AST: 150 IU/L — ABNORMAL HIGH (ref 0–40)
Albumin/Globulin Ratio: 2 (ref 1.2–2.2)
Albumin: 4.4 g/dL (ref 3.8–4.8)
Alkaline Phosphatase: 107 IU/L (ref 39–117)
BUN/Creatinine Ratio: 16 (ref 9–23)
BUN: 11 mg/dL (ref 6–24)
Bilirubin Total: 0.4 mg/dL (ref 0.0–1.2)
CO2: 27 mmol/L (ref 20–29)
Calcium: 9.3 mg/dL (ref 8.7–10.2)
Chloride: 95 mmol/L — ABNORMAL LOW (ref 96–106)
Creatinine, Ser: 0.68 mg/dL (ref 0.57–1.00)
GFR calc Af Amer: 125 mL/min/{1.73_m2} (ref >59)
GFR calc non Af Amer: 108 mL/min/{1.73_m2} (ref >59)
Globulin, Total: 2.2 g/dL (ref 1.5–4.5)
Glucose: 180 mg/dL — ABNORMAL HIGH (ref 65–99)
Potassium: 4.2 mmol/L (ref 3.5–5.2)
Sodium: 135 mmol/L (ref 134–144)
Total Protein: 6.6 g/dL (ref 6.0–8.5)

## 2020-03-31 LAB — CBC WITH DIFFERENTIAL/PLATELET
Basophils Absolute: 0 10*3/uL (ref 0.0–0.2)
Basos: 1 %
EOS (ABSOLUTE): 0.4 10*3/uL (ref 0.0–0.4)
Eos: 6 %
Hematocrit: 37 % (ref 34.0–46.6)
Hemoglobin: 12.4 g/dL (ref 11.1–15.9)
Immature Grans (Abs): 0 10*3/uL (ref 0.0–0.1)
Immature Granulocytes: 1 %
Lymphocytes Absolute: 1.9 10*3/uL (ref 0.7–3.1)
Lymphs: 31 %
MCH: 30.8 pg (ref 26.6–33.0)
MCHC: 33.5 g/dL (ref 31.5–35.7)
MCV: 92 fL (ref 79–97)
Monocytes Absolute: 0.4 10*3/uL (ref 0.1–0.9)
Monocytes: 7 %
Neutrophils Absolute: 3.5 10*3/uL (ref 1.4–7.0)
Neutrophils: 54 %
Platelets: 179 10*3/uL (ref 150–450)
RBC: 4.02 x10E6/uL (ref 3.77–5.28)
RDW: 12.9 % (ref 11.7–15.4)
WBC: 6.3 10*3/uL (ref 3.4–10.8)

## 2020-03-31 LAB — VITAMIN D 25 HYDROXY (VIT D DEFICIENCY, FRACTURES): Vit D, 25-Hydroxy: 18 ng/mL — ABNORMAL LOW (ref 30.0–100.0)

## 2020-03-31 LAB — LIPID PANEL W/O CHOL/HDL RATIO
Cholesterol, Total: 231 mg/dL — ABNORMAL HIGH (ref 100–199)
HDL: 39 mg/dL — ABNORMAL LOW (ref >39)
LDL Chol Calc (NIH): 145 mg/dL — ABNORMAL HIGH (ref 0–99)
Triglycerides: 260 mg/dL — ABNORMAL HIGH (ref 0–149)
VLDL Cholesterol Cal: 47 mg/dL — ABNORMAL HIGH (ref 5–40)

## 2020-03-31 MED ORDER — VITAMIN D (ERGOCALCIFEROL) 1.25 MG (50000 UNIT) PO CAPS: 50000.0000 [IU] | ORAL_CAPSULE | ORAL | 1 refills | Status: DC

## 2020-04-04 ENCOUNTER — Encounter: Payer: Self-pay | Admitting: Family Medicine

## 2020-04-05 MED ORDER — ATORVASTATIN CALCIUM 40 MG PO TABS
40.0000 mg | ORAL_TABLET | Freq: Every day | ORAL | 1 refills | Status: DC
Start: 2020-04-05 — End: 2020-10-11

## 2020-05-26 ENCOUNTER — Encounter: Payer: Self-pay | Admitting: Family Medicine

## 2020-05-26 ENCOUNTER — Telehealth: Payer: Self-pay | Admitting: Family Medicine

## 2020-05-26 DIAGNOSIS — E1165 Type 2 diabetes mellitus with hyperglycemia: Secondary | ICD-10-CM

## 2020-05-26 NOTE — Addendum Note (Signed)
Addended by: Merrie Roof E on: 05/26/2020 10:32 AM   Modules accepted: Orders

## 2020-05-26 NOTE — Telephone Encounter (Signed)
Left detailed VM per DPR

## 2020-05-26 NOTE — Telephone Encounter (Signed)
Referral generated

## 2020-05-26 NOTE — Telephone Encounter (Signed)
Copied from [place] [id number]. Topic: Referral - Request for Referral >> May 26, 2020 10:03 AM Celene Kras wrote: Has patient seen PCP for this complaint? Yes.   *If NO, is insurance requiring patient see PCP for this issue before PCP can refer them? Referral for which specialty: opthalmology  Preferred provider/office: [facility]  Reason for referral: Pt states that she is needing a diabetic eye exam. Pt states that she already has an appt for 06/01/20 at 8am.Would she have to wait until apt for referral?  Please advise.

## 2020-06-01 LAB — HM DIABETES EYE EXAM

## 2020-06-02 ENCOUNTER — Telehealth (INDEPENDENT_AMBULATORY_CARE_PROVIDER_SITE_OTHER): Payer: No Typology Code available for payment source | Admitting: Family Medicine

## 2020-06-02 ENCOUNTER — Encounter: Payer: Self-pay | Admitting: Family Medicine

## 2020-06-02 ENCOUNTER — Other Ambulatory Visit: Payer: Self-pay | Admitting: Family Medicine

## 2020-06-02 VITALS — Wt 168.0 lb

## 2020-06-02 DIAGNOSIS — E6609 Other obesity due to excess calories: Secondary | ICD-10-CM

## 2020-06-02 DIAGNOSIS — E66811 Obesity, class 1: Secondary | ICD-10-CM

## 2020-06-02 DIAGNOSIS — Z6833 Body mass index (BMI) 33.0-33.9, adult: Secondary | ICD-10-CM

## 2020-06-02 MED ORDER — SAXENDA 18 MG/3ML ~~LOC~~ SOPN: PEN_INJECTOR | SUBCUTANEOUS | 3 refills | Status: DC

## 2020-06-02 NOTE — Assessment & Plan Note (Signed)
Congratulated patient on her 6lb weight loss! Having a lot of trouble with hunger signs. Did well on saxenda previously with good maintenance. Will restart saxenda and recheck 4-6 weeks. Call with any concerns.

## 2020-06-02 NOTE — Progress Notes (Signed)
Wt 168 lb (76.2 kg)   BMI 33.93 kg/m    Subjective:    Patient ID: Ashley Wiley, female    DOB: 1976-05-05, 44 y.o.   MRN: [id number]  HPI: Ashley Wiley is a 44 y.o. female  Chief Complaint  Patient presents with  . Weight Loss   WEIGHT GAIN- feeling hungry all the time, especially in the AM and PM.  Duration: chronic Previous attempts at weight loss: yes, diet, exercise, weight loss clinic, medication Complications of obesity: DM, Depression, HLD Peak weight: 174 Weight loss goal: to be healthy Weight loss to date: 6lbs Requesting obesity pharmacotherapy: yes Current weight loss supplements/medications: no Previous weight loss supplements/meds: yes  Relevant past medical, surgical, family and social history reviewed and updated as indicated. Interim medical history since our last visit reviewed. Allergies and medications reviewed and updated.  Review of Systems  Constitutional: Negative.   Respiratory: Negative.   Cardiovascular: Negative.   Gastrointestinal: Negative.   Skin: Negative.   Neurological: Negative.   Psychiatric/Behavioral: Negative.     Per HPI unless specifically indicated above     Objective:    Wt 168 lb (76.2 kg)   BMI 33.93 kg/m   Wt Readings from Last 3 Encounters:  06/02/20 168 lb (76.2 kg)  03/30/20 174 lb 3.2 oz (79 kg)  01/29/20 167 lb (75.8 kg)    Physical Exam Vitals and nursing note reviewed.  Constitutional:      General: She is not in acute distress.    Appearance: Normal appearance. She is not ill-appearing, toxic-appearing or diaphoretic.  HENT:     Head: Normocephalic and atraumatic.     Right Ear: External ear normal.     Left Ear: External ear normal.     Nose: Nose normal.     Mouth/Throat:     Mouth: Mucous membranes are moist.     Pharynx: Oropharynx is clear.  Eyes:     General: No scleral icterus.       Right eye: No discharge.        Left eye: No discharge.     Conjunctiva/sclera: Conjunctivae  normal.     Pupils: Pupils are equal, round, and reactive to light.  Pulmonary:     Effort: Pulmonary effort is normal. No respiratory distress.     Comments: Speaking in full sentences Musculoskeletal:        General: Normal range of motion.     Cervical back: Normal range of motion.  Skin:    Coloration: Skin is not jaundiced or pale.     Findings: No bruising, erythema, lesion or rash.  Neurological:     Mental Status: She is alert and oriented to person, place, and time. Mental status is at baseline.  Psychiatric:        Mood and Affect: Mood normal.        Behavior: Behavior normal.        Thought Content: Thought content normal.        Judgment: Judgment normal.     Results for orders placed or performed in visit on 06/01/20  HM DIABETES EYE EXAM  Result Value Ref Range   HM Diabetic Eye Exam No Retinopathy No Retinopathy      Assessment & Plan:   Problem List Items Addressed This Visit      Other   Obesity - Primary    Congratulated patient on her 6lb weight loss! Having a lot of trouble with hunger signs. Did well  on saxenda previously with good maintenance. Will restart saxenda and recheck 4-6 weeks. Call with any concerns.       Relevant Medications   Liraglutide -Weight Management (SAXENDA) 18 MG/3ML SOPN       Follow up plan: Return in about 4 weeks (around 06/30/2020).   . This visit was completed via MyChart due to the restrictions of the COVID-19 pandemic. All issues as above were discussed and addressed. Physical exam was done as above through visual confirmation on MyChart. If it was felt that the patient should be evaluated in the office, they were directed there. The patient verbally consented to this visit. . Location of the patient: work . Location of the provider: work . Those involved with this call:  . Provider: Park Liter, DO . CMA: Lauretta Grill, RMA . Front Desk/Registration: Don Perking  . Time spent on call: 15 minutes with  patient face to face via video conference. More than 50% of this time was spent in counseling and coordination of care. 23 minutes total spent in review of patient's record and preparation of their chart.

## 2020-07-06 ENCOUNTER — Ambulatory Visit (INDEPENDENT_AMBULATORY_CARE_PROVIDER_SITE_OTHER): Payer: No Typology Code available for payment source | Admitting: Family Medicine

## 2020-07-06 ENCOUNTER — Encounter: Payer: Self-pay | Admitting: Family Medicine

## 2020-07-06 ENCOUNTER — Other Ambulatory Visit: Payer: Self-pay

## 2020-07-06 VITALS — BP 110/75 | HR 74 | Temp 98.6°F | Ht 59.25 in | Wt 164.8 lb

## 2020-07-06 DIAGNOSIS — E782 Mixed hyperlipidemia: Secondary | ICD-10-CM

## 2020-07-06 DIAGNOSIS — R7989 Other specified abnormal findings of blood chemistry: Secondary | ICD-10-CM | POA: Diagnosis not present

## 2020-07-06 DIAGNOSIS — E041 Nontoxic single thyroid nodule: Secondary | ICD-10-CM

## 2020-07-06 DIAGNOSIS — E559 Vitamin D deficiency, unspecified: Secondary | ICD-10-CM | POA: Diagnosis not present

## 2020-07-06 DIAGNOSIS — Z6833 Body mass index (BMI) 33.0-33.9, adult: Secondary | ICD-10-CM

## 2020-07-06 DIAGNOSIS — Z1159 Encounter for screening for other viral diseases: Secondary | ICD-10-CM

## 2020-07-06 DIAGNOSIS — G500 Trigeminal neuralgia: Secondary | ICD-10-CM

## 2020-07-06 DIAGNOSIS — E6609 Other obesity due to excess calories: Secondary | ICD-10-CM

## 2020-07-06 DIAGNOSIS — Z0000 Encounter for general adult medical examination without abnormal findings: Secondary | ICD-10-CM

## 2020-07-06 DIAGNOSIS — E1165 Type 2 diabetes mellitus with hyperglycemia: Secondary | ICD-10-CM

## 2020-07-06 DIAGNOSIS — F332 Major depressive disorder, recurrent severe without psychotic features: Secondary | ICD-10-CM

## 2020-07-06 DIAGNOSIS — Z23 Encounter for immunization: Secondary | ICD-10-CM

## 2020-07-06 LAB — URINALYSIS, ROUTINE W REFLEX MICROSCOPIC
Bilirubin, UA: NEGATIVE
Ketones, UA: NEGATIVE
Leukocytes,UA: NEGATIVE
Nitrite, UA: NEGATIVE
Protein,UA: NEGATIVE
Specific Gravity, UA: 1.015 (ref 1.005–1.030)
Urobilinogen, Ur: 0.2 mg/dL (ref 0.2–1.0)
pH, UA: 6.5 (ref 5.0–7.5)

## 2020-07-06 LAB — MICROSCOPIC EXAMINATION: WBC, UA: NONE SEEN /hpf (ref 0–5)

## 2020-07-06 LAB — BAYER DCA HB A1C WAIVED: HB A1C (BAYER DCA - WAIVED): 5.8 % (ref <7.0)

## 2020-07-06 LAB — MICROALBUMIN, URINE WAIVED
Creatinine, Urine Waived: 100 mg/dL (ref 10–300)
Microalb, Ur Waived: 30 mg/L — ABNORMAL HIGH (ref 0–19)
Microalb/Creat Ratio: <30 mg/g (ref <30)

## 2020-07-06 NOTE — Patient Instructions (Signed)
Health Maintenance, Female Adopting a healthy lifestyle and getting preventive care are important in promoting health and wellness. Ask your health care provider about:  The right schedule for you to have regular tests and exams.  Things you can do on your own to prevent diseases and keep yourself healthy. What should I know about diet, weight, and exercise? Eat a healthy diet   Eat a diet that includes plenty of vegetables, fruits, low-fat dairy products, and lean protein.  Do not eat a lot of foods that are high in solid fats, added sugars, or sodium. Maintain a healthy weight Body mass index (BMI) is used to identify weight problems. It estimates body fat based on height and weight. Your health care provider can help determine your BMI and help you achieve or maintain a healthy weight. Get regular exercise Get regular exercise. This is one of the most important things you can do for your health. Most adults should:  Exercise for at least 150 minutes each week. The exercise should increase your heart rate and make you sweat (moderate-intensity exercise).  Do strengthening exercises at least twice a week. This is in addition to the moderate-intensity exercise.  Spend less time sitting. Even light physical activity can be beneficial. Watch cholesterol and blood lipids Have your blood tested for lipids and cholesterol at 43 years of age, then have this test every 5 years. Have your cholesterol levels checked more often if:  Your lipid or cholesterol levels are high.  You are older than 43 years of age.  You are at high risk for heart disease. What should I know about cancer screening? Depending on your health history and family history, you may need to have cancer screening at various ages. This may include screening for:  Breast cancer.  Cervical cancer.  Colorectal cancer.  Skin cancer.  Lung cancer. What should I know about heart disease, diabetes, and high blood  pressure? Blood pressure and heart disease  High blood pressure causes heart disease and increases the risk of stroke. This is more likely to develop in people who have high blood pressure readings, are of African descent, or are overweight.  Have your blood pressure checked: ? Every 3-5 years if you are 43-43 years of age. ? Every year if you are 40 years old or older. Diabetes Have regular diabetes screenings. This checks your fasting blood sugar level. Have the screening done:  Once every three years after age 43 if you are at a normal weight and have a low risk for diabetes.  More often and at a younger age if you are overweight or have a high risk for diabetes. What should I know about preventing infection? Hepatitis B If you have a higher risk for hepatitis B, you should be screened for this virus. Talk with your health care provider to find out if you are at risk for hepatitis B infection. Hepatitis C Testing is recommended for:  Everyone born from 1945 through 1965.  Anyone with known risk factors for hepatitis C. Sexually transmitted infections (STIs)  Get screened for STIs, including gonorrhea and chlamydia, if: ? You are sexually active and are younger than 43 years of age. ? You are older than 43 years of age and your health care provider tells you that you are at risk for this type of infection. ? Your sexual activity has changed since you were last screened, and you are at increased risk for chlamydia or gonorrhea. Ask your health care provider if   you are at risk.  Ask your health care provider about whether you are at high risk for HIV. Your health care provider may recommend a prescription medicine to help prevent HIV infection. If you choose to take medicine to prevent HIV, you should first get tested for HIV. You should then be tested every 3 months for as long as you are taking the medicine. Pregnancy  If you are about to stop having your period (premenopausal) and  you may become pregnant, seek counseling before you get pregnant.  Take 400 to 800 micrograms (mcg) of folic acid every day if you become pregnant.  Ask for birth control (contraception) if you want to prevent pregnancy. Osteoporosis and menopause Osteoporosis is a disease in which the bones lose minerals and strength with aging. This can result in bone fractures. If you are 8 years old or older, or if you are at risk for osteoporosis and fractures, ask your health care provider if you should:  Be screened for bone loss.  Take a calcium or vitamin D supplement to lower your risk of fractures.  Be given hormone replacement therapy (HRT) to treat symptoms of menopause. Follow these instructions at home: Lifestyle  Do not use any products that contain nicotine or tobacco, such as cigarettes, e-cigarettes, and chewing tobacco. If you need help quitting, ask your health care provider.  Do not use street drugs.  Do not share needles.  Ask your health care provider for help if you need support or information about quitting drugs. Alcohol use  Do not drink alcohol if: ? Your health care provider tells you not to drink. ? You are pregnant, may be pregnant, or are planning to become pregnant.  If you drink alcohol: ? Limit how much you use to 0-1 drink a day. ? Limit intake if you are breastfeeding.  Be aware of how much alcohol is in your drink. In the [place] one drink equals one 12 oz bottle of beer (355 mL), one 5 oz glass of wine (148 mL), or one 1 oz glass of hard liquor (44 mL). General instructions  Schedule regular health, dental, and eye exams.  Stay current with your vaccines.  Tell your health care provider if: ? You often feel depressed. ? You have ever been abused or do not feel safe at home. Summary  Adopting a healthy lifestyle and getting preventive care are important in promoting health and wellness.  Follow your health care provider's instructions about healthy  diet, exercising, and getting tested or screened for diseases.  Follow your health care provider's instructions on monitoring your cholesterol and blood pressure. This information is not intended to replace advice given to you by your health care provider. Make sure you discuss any questions you have with your health care provider. Document Revised: 10/30/2018 Document Reviewed: 10/30/2018 Elsevier Patient Education  [place]. Pneumococcal Polysaccharide Vaccine (PPSV23): What You Need to Know 1. Why get vaccinated? Pneumococcal polysaccharide vaccine (PPSV23) can prevent pneumococcal disease. Pneumococcal disease refers to any illness caused by pneumococcal bacteria. These bacteria can cause many types of illnesses, including pneumonia, which is an infection of the lungs. Pneumococcal bacteria are one of the most common causes of pneumonia. Besides pneumonia, pneumococcal bacteria can also cause:  Ear infections  Sinus infections  Meningitis (infection of the tissue covering the brain and spinal cord)  Bacteremia (bloodstream infection) Anyone can get pneumococcal disease, but children under 65 years of age, people with certain medical conditions, adults 26 years or  older, and cigarette smokers are at the highest risk. Most pneumococcal infections are mild. However, some can result in long-term problems, such as brain damage or hearing loss. Meningitis, bacteremia, and pneumonia caused by pneumococcal disease can be fatal. 2. PPSV23 PPSV23 protects against 23 types of bacteria that cause pneumococcal disease. PPSV23 is recommended for:  All adults 59 years or older,  Anyone 2 years or older with certain medical conditions that can lead to an increased risk for pneumococcal disease. Most people need only one dose of PPSV23. A second dose of PPSV23, and another type of pneumococcal vaccine called PCV13, are recommended for certain high-risk groups. Your health care provider can  give you more information. People 65 years or older should get a dose of PPSV23 even if they have already gotten one or more doses of the vaccine before they turned 39. 3. Talk with your health care provider Tell your vaccine provider if the person getting the vaccine:  Has had an allergic reaction after a previous dose of PPSV23, or has any severe, life-threatening allergies. In some cases, your health care provider may decide to postpone PPSV23 vaccination to a future visit. People with minor illnesses, such as a cold, may be vaccinated. People who are moderately or severely ill should usually wait until they recover before getting PPSV23. Your health care provider can give you more information. 4. Risks of a vaccine reaction  Redness or pain where the shot is given, feeling tired, fever, or muscle aches can happen after PPSV23. People sometimes faint after medical procedures, including vaccination. Tell your provider if you feel dizzy or have vision changes or ringing in the ears. As with any medicine, there is a very remote chance of a vaccine causing a severe allergic reaction, other serious injury, or death. 5. What if there is a serious problem? An allergic reaction could occur after the vaccinated person leaves the clinic. If you see signs of a severe allergic reaction (hives, swelling of the face and throat, difficulty breathing, a fast heartbeat, dizziness, or weakness), call 9-1-1 and get the person to the nearest hospital. For other signs that concern you, call your health care provider. Adverse reactions should be reported to the Vaccine Adverse Event Reporting System (VAERS). Your health care provider will usually file this report, or you can do it yourself. Visit the VAERS website at www.vaers.SamedayNews.es or call 979-818-1573. VAERS is only for reporting reactions, and VAERS staff do not give medical advice. 6. How can I learn more?  Ask your health care provider.  Call your local  or state health department.  Contact the Centers for Disease Control and Prevention (CDC): ? Call 905-513-5314 (1-800-CDC-INFO) or ? Visit CDC's website at http://hunter.com/ CDC Vaccine Information Statement PPSV23 Vaccine (09/18/2018) This information is not intended to replace advice given to you by your health care provider. Make sure you discuss any questions you have with your health care provider. Document Revised: 02/25/2019 Document Reviewed: 06/18/2018 Elsevier Patient Education  [place].

## 2020-07-06 NOTE — Progress Notes (Addendum)
BP 110/75 (BP Location: Left Arm, Patient Position: Sitting, Cuff Size: Normal)   Pulse 74   Temp 98.6 F (37 C) (Oral)   Ht 4' 11.25" (1.505 m)   Wt 164 lb 12.8 oz (74.8 kg)   SpO2 98%   BMI 33.00 kg/m    Subjective:    Patient ID: Ashley Wiley, female    DOB: [birth year], 44 y.o.   MRN: [id number]  HPI: Ashley Wiley is a 44 y.o. female presenting on 07/06/2020 for comprehensive medical examination. Current medical complaints include:  Obesity- only taking 0.6 on the saxenda, tolerating that dose well.  Duration: chronic Previous attempts at weight loss: yes Complications of obesity: DM, Depression, HLD Peak weight: 174 Weight loss goal: to be healthy Weight loss to date: 6lbs Requesting obesity pharmacotherapy: yes Current weight loss supplements/medications: yes Previous weight loss supplements/meds: yes  DIABETES Hypoglycemic episodes:no Polydipsia/polyuria: yes Visual disturbance: no Chest pain: no Paresthesias: no Glucose Monitoring: yes  Accucheck frequency: Daily 80s-110 Taking Insulin?: no Blood Pressure Monitoring: not checking Retinal Examination: Up to Date Foot Exam: Up to Date Diabetic Education: Completed Pneumovax: Up to Date Influenza: Up to Date Aspirin: no  HYPERLIPIDEMIA Hyperlipidemia status: excellent compliance Satisfied with current treatment?  no Side effects:  no Medication compliance: excellent compliance Past cholesterol meds: atorvastatin Supplements: none Aspirin:  no The ASCVD Risk score Mikey Bussing DC Jr., et al., 2013) failed to calculate for the following reasons:   The valid total cholesterol range is 130 to 320 mg/dL Chest pain:  no Coronary artery disease:  no  DEPRESSION Mood status: stable Satisfied with current treatment?: yes Symptom severity: mild  Duration of current treatment : chronic Side effects: no Medication compliance: excellent compliance Psychotherapy/counseling: no  Depressed mood: yes Anxious  mood: yes Anhedonia: no Significant weight loss or gain: no Insomnia: yes hard to fall asleep Fatigue: yes Feelings of worthlessness or guilt: yes Impaired concentration/indecisiveness: no Suicidal ideations: no Hopelessness: no Crying spells: no Depression screen [facility] 2/9 03/30/2020 12/31/2019 10/02/2019 06/27/2019 03/20/2019  Decreased Interest 0 3 2 2 2   Down, Depressed, Hopeless 0 3 1 1 3   PHQ - 2 Score 0 6 3 3 5   Altered sleeping 0 3 2 1 3   Tired, decreased energy 1 3 3 1 3   Change in appetite 0 3 2 2 3   Feeling bad or failure about yourself  0 3 1 0 3  Trouble concentrating 0 3 0 1 3  Moving slowly or fidgety/restless 0 3 0 1 3  Suicidal thoughts 0 1 0 0 0  PHQ-9 Score 1 25 11 9 23   Difficult doing work/chores Not difficult at all Very difficult Somewhat difficult - Very difficult  Some recent data might be hidden   GAD 7 : Generalized Anxiety Score 07/11/2020 06/27/2019 03/20/2019 02/27/2019  Nervous, Anxious, on Edge 3 2 3 3   Control/stop worrying 3 2 3 3   Worry too much - different things 3 2 3 3   Trouble relaxing 3 2 3 3   Restless 0 2 2 3   Easily annoyed or irritable 1 1 1 2   Afraid - awful might happen 3 1 3 3   Total GAD 7 Score 16 12 18 20   Anxiety Difficulty Very difficult Somewhat difficult Very difficult Extremely difficult    Having pain in her R side of her face and in her forehead  She currently lives with: husband and kids Menopausal Symptoms: no  Depression Screen done today and results listed below:  Depression  screen [facility] 2/9 03/30/2020 12/31/2019 10/02/2019 06/27/2019 03/20/2019  Decreased Interest 0 3 2 2 2   Down, Depressed, Hopeless 0 3 1 1 3   PHQ - 2 Score 0 6 3 3 5   Altered sleeping 0 3 2 1 3   Tired, decreased energy 1 3 3 1 3   Change in appetite 0 3 2 2 3   Feeling bad or failure about yourself  0 3 1 0 3  Trouble concentrating 0 3 0 1 3  Moving slowly or fidgety/restless 0 3 0 1 3  Suicidal thoughts 0 1 0 0 0  PHQ-9 Score 1 25 11 9 23   Difficult doing  work/chores Not difficult at all Very difficult Somewhat difficult - Very difficult  Some recent data might be hidden    Past Medical History:  Past Medical History:  Diagnosis Date  . Allergy   . Aneurysm ([place])   . Anxiety   . Back pain   . Constipation   . Depression   . Dermatofibroma   . Diabetes mellitus without complication ([place])    Takes Metformin  dx 2016  . Facial pain   . Facial pain   . GERD (gastroesophageal reflux disease)   . Headache   . Hyperlipidemia   . IFG (impaired fasting glucose)   . Low HDL (under 40)   . Migraine   . Obesity   . Overweight   . Patella-femoral syndrome   . Pinguecula   . Prediabetes   . Thyroid nodule april 2016  . Vitamin B12 deficiency   . Vitamin D deficiency disease     Surgical History:  Past Surgical History:  Procedure Laterality Date  . CARPAL TUNNEL RELEASE    . GANGLION CYST EXCISION Left   . IR 3D INDEPENDENT WKST  12/04/2017  . IR ANGIO INTRA EXTRACRAN SEL INTERNAL CAROTID BILAT MOD SED  12/04/2017  . IR ANGIO INTRA EXTRACRAN SEL INTERNAL CAROTID UNI L MOD SED  03/05/2018  . IR ANGIO INTRA EXTRACRAN SEL INTERNAL CAROTID UNI L MOD SED  05/02/2019  . IR ANGIO VERTEBRAL SEL VERTEBRAL BILAT MOD SED  12/04/2017  . IR ANGIOGRAM FOLLOW UP STUDY  03/05/2018  . IR ANGIOGRAM FOLLOW UP STUDY  03/05/2018  . IR ANGIOGRAM FOLLOW UP STUDY  03/05/2018  . IR NEURO EACH ADD'L AFTER BASIC UNI LEFT (MS)  03/05/2018  . IR TRANSCATH/EMBOLIZ  03/05/2018  . NASAL SINUS SURGERY  Nov 2015  . RADIOLOGY WITH ANESTHESIA N/A 03/05/2018   Procedure: Arteriogram, coil embolization, possible stenting;  Surgeon: Consuella Lose, MD;  Location: [place];  Service: Radiology;  Laterality: N/A;  . TUBAL LIGATION      Medications:  Current Outpatient Medications on File Prior to Visit  Medication Sig  . acetaminophen (TYLENOL) 500 MG tablet Take 1,000 mg by mouth daily as needed for moderate pain or headache.  Marland Kitchen atorvastatin (LIPITOR) 40 MG tablet Take 1  tablet (40 mg total) by mouth daily.  . ciclopirox (PENLAC) 8 % solution Apply topically at bedtime. Apply over nail and surrounding skin. Apply daily over previous coat. After seven (7) days, may remove with alcohol and continue cycle.  . cyclobenzaprine (FLEXERIL) 10 MG tablet Take 1 tablet (10 mg total) by mouth at bedtime.  . fluticasone (FLONASE) 50 MCG/ACT nasal spray Place 2 sprays into both nostrils daily as needed for allergies.  Marland Kitchen FREESTYLE LITE test strip   . ibuprofen (ADVIL,MOTRIN) 200 MG tablet Take 800 mg by mouth daily as needed for headache or  moderate pain.  . Liraglutide -Weight Management (SAXENDA) 18 MG/3ML SOPN Inject 0.1 mLs (0.6 mg total) into the skin daily for 7 days, THEN 0.2 mLs (1.2 mg total) daily for 7 days, THEN 0.3 mLs (1.8 mg total) daily for 7 days, THEN 0.4 mLs (2.4 mg total) daily for 7 days, THEN 0.5 mLs (3 mg total) daily.  Marland Kitchen loratadine (CLARITIN) 10 MG tablet Take 10 mg by mouth daily as needed for allergies.   . metFORMIN (GLUCOPHAGE-XR) 500 MG 24 hr tablet Take 2 tablets (1,000 mg total) by mouth in the morning and at bedtime.  . naproxen (NAPROSYN) 500 MG tablet Take 1 tablet (500 mg total) by mouth 2 (two) times daily with a meal.  . nortriptyline (PAMELOR) 50 MG capsule Take 1 capsule (50 mg total) by mouth at bedtime.  . propranolol ER (INDERAL LA) 80 MG 24 hr capsule Take 1 capsule (80 mg total) by mouth daily.  . sertraline (ZOLOFT) 100 MG tablet Take 150 mg by mouth every morning.   Marland Kitchen UNIFINE PENTIPS 31G X 5 MM MISC   . Vitamin D, Ergocalciferol, (DRISDOL) 1.25 MG (50000 UNIT) CAPS capsule Take 1 capsule (50,000 Units total) by mouth every 7 (seven) days.   No current facility-administered medications on file prior to visit.    Allergies:  Allergies  Allergen Reactions  . Imitrex [Sumatriptan] Other (See Comments)    Chest pain   . Wellbutrin [Bupropion] Other (See Comments)    Increased anxiety and tremors    Social History:  Social  History   Socioeconomic History  . Marital status: Married    Spouse name: Not on file  . Number of children: Not on file  . Years of education: Not on file  . Highest education level: Not on file  Occupational History  . Not on file  Tobacco Use  . Smoking status: Never Smoker  . Smokeless tobacco: Never Used  Vaping Use  . Vaping Use: Never used  Substance and Sexual Activity  . Alcohol use: Not Currently    Alcohol/week: 0.0 - 1.0 standard drinks    Comment: on occasion  . Drug use: No  . Sexual activity: Yes    Birth control/protection: None  Other Topics Concern  . Not on file  Social History Narrative  . Not on file   Social Determinants of Health   Financial Resource Strain:   . Difficulty of Paying Living Expenses: Not on file  Food Insecurity:   . Worried About Charity fundraiser in the Last Year: Not on file  . Ran Out of Food in the Last Year: Not on file  Transportation Needs:   . Lack of Transportation (Medical): Not on file  . Lack of Transportation (Non-Medical): Not on file  Physical Activity:   . Days of Exercise per Week: Not on file  . Minutes of Exercise per Session: Not on file  Stress:   . Feeling of Stress : Not on file  Social Connections:   . Frequency of Communication with Friends and Family: Not on file  . Frequency of Social Gatherings with Friends and Family: Not on file  . Attends Religious Services: Not on file  . Active Member of Clubs or Organizations: Not on file  . Attends Archivist Meetings: Not on file  . Marital Status: Not on file  Intimate Partner Violence:   . Fear of Current or Ex-Partner: Not on file  . Emotionally Abused: Not on file  .  Physically Abused: Not on file  . Sexually Abused: Not on file   Social History   Tobacco Use  Smoking Status Never Smoker  Smokeless Tobacco Never Used   Social History   Substance and Sexual Activity  Alcohol Use Not Currently  . Alcohol/week: 0.0 - 1.0 standard  drinks   Comment: on occasion    Family History:  Family History  Problem Relation Age of Onset  . Diabetes Father   . Hypertension Father   . Heart disease Father   . Kidney disease Father   . Stroke Father   . Aneurysm Father   . Hyperlipidemia Father   . Depression Mother   . Anxiety disorder Mother   . Diabetes Paternal Grandfather     Past medical history, surgical history, medications, allergies, family history and social history reviewed with patient today and changes made to appropriate areas of the chart.   Review of Systems  Constitutional: Negative.   HENT: Negative.   Eyes: Negative.   Respiratory: Negative.   Cardiovascular: Positive for palpitations (for a few weeks up to 120s, after she eats). Negative for chest pain, orthopnea, claudication, leg swelling and PND.  Gastrointestinal: Positive for abdominal pain, heartburn (with the saxenda) and nausea. Negative for blood in stool, constipation, diarrhea, melena and vomiting.  Genitourinary: Negative.   Musculoskeletal: Negative.   Skin: Negative.   Neurological: Positive for headaches (about a week). Negative for dizziness, tingling, tremors, sensory change, speech change, focal weakness, seizures, loss of consciousness and weakness.  Endo/Heme/Allergies: Positive for polydipsia. Negative for environmental allergies. Does not bruise/bleed easily.  Psychiatric/Behavioral: Negative.     All other ROS negative except what is listed above and in the HPI.      Objective:    BP 110/75 (BP Location: Left Arm, Patient Position: Sitting, Cuff Size: Normal)   Pulse 74   Temp 98.6 F (37 C) (Oral)   Ht 4' 11.25" (1.505 m)   Wt 164 lb 12.8 oz (74.8 kg)   SpO2 98%   BMI 33.00 kg/m   Wt Readings from Last 3 Encounters:  07/06/20 164 lb 12.8 oz (74.8 kg)  06/02/20 168 lb (76.2 kg)  03/30/20 174 lb 3.2 oz (79 kg)    Physical Exam Vitals and nursing note reviewed.  Constitutional:      General: She is not in  acute distress.    Appearance: Normal appearance. She is not ill-appearing, toxic-appearing or diaphoretic.  HENT:     Head: Normocephalic and atraumatic.     Right Ear: Tympanic membrane, ear canal and external ear normal. There is no impacted cerumen.     Left Ear: Tympanic membrane, ear canal and external ear normal. There is no impacted cerumen.     Nose: Nose normal. No congestion or rhinorrhea.     Mouth/Throat:     Mouth: Mucous membranes are moist.     Pharynx: Oropharynx is clear. No oropharyngeal exudate or posterior oropharyngeal erythema.  Eyes:     General: No scleral icterus.       Right eye: No discharge.        Left eye: No discharge.     Extraocular Movements: Extraocular movements intact.     Conjunctiva/sclera: Conjunctivae normal.     Pupils: Pupils are equal, round, and reactive to light.  Neck:     Vascular: No carotid bruit.  Cardiovascular:     Rate and Rhythm: Normal rate and regular rhythm.     Pulses: Normal pulses.  Heart sounds: No murmur heard.  No friction rub. No gallop.   Pulmonary:     Effort: Pulmonary effort is normal. No respiratory distress.     Breath sounds: Normal breath sounds. No stridor. No wheezing, rhonchi or rales.  Chest:     Chest wall: No tenderness.     Breasts:        Right: Normal. No swelling, bleeding, inverted nipple, mass, nipple discharge, skin change or tenderness.        Left: Normal. No swelling, bleeding, inverted nipple, mass, nipple discharge, skin change or tenderness.  Abdominal:     General: Abdomen is flat. Bowel sounds are normal. There is no distension.     Palpations: Abdomen is soft. There is no mass.     Tenderness: There is no abdominal tenderness. There is no right CVA tenderness, left CVA tenderness, guarding or rebound.     Hernia: No hernia is present.  Genitourinary:    Comments: Pelvic exams deferred with shared decision making Musculoskeletal:        General: No swelling, tenderness, deformity  or signs of injury.     Cervical back: Normal range of motion and neck supple. No rigidity. No muscular tenderness.     Right lower leg: No edema.     Left lower leg: No edema.  Lymphadenopathy:     Cervical: No cervical adenopathy.     Upper Body:     Right upper body: No supraclavicular, axillary or pectoral adenopathy.     Left upper body: No supraclavicular, axillary or pectoral adenopathy.  Skin:    General: Skin is warm and dry.     Capillary Refill: Capillary refill takes less than 2 seconds.     Coloration: Skin is not jaundiced or pale.     Findings: No bruising, erythema, lesion or rash.  Neurological:     General: No focal deficit present.     Mental Status: She is alert and oriented to person, place, and time. Mental status is at baseline.     Cranial Nerves: No cranial nerve deficit.     Sensory: No sensory deficit.     Motor: No weakness.     Coordination: Coordination normal.     Gait: Gait normal.     Deep Tendon Reflexes: Reflexes normal.  Psychiatric:        Mood and Affect: Mood normal.        Behavior: Behavior normal.        Thought Content: Thought content normal.        Judgment: Judgment normal.     Results for orders placed or performed in visit on 07/06/20  Microscopic Examination   Urine  Result Value Ref Range   WBC, UA None seen 0 - 5 /hpf   RBC 0-2 0 - 2 /hpf   Epithelial Cells (non renal) 0-10 0 - 10 /hpf   Bacteria, UA Few (A) None seen/Few  Bayer DCA Hb A1c Waived  Result Value Ref Range   HB A1C (BAYER DCA - WAIVED) 5.8 <7.0 %  CBC with Differential/Platelet  Result Value Ref Range   WBC 6.9 3.4 - 10.8 x10E3/uL   RBC 4.21 3.77 - 5.28 x10E6/uL   Hemoglobin 13.0 11.1 - 15.9 g/dL   Hematocrit 39.6 34.0 - 46.6 %   MCV 94 79 - 97 fL   MCH 30.9 26.6 - 33.0 pg   MCHC 32.8 31 - 35 g/dL   RDW 12.5 11.7 - 15.4 %  Platelets 203 150 - 450 x10E3/uL   Neutrophils 59 Not Estab. %   Lymphs 32 Not Estab. %   Monocytes 6 Not Estab. %   Eos 3  Not Estab. %   Basos 0 Not Estab. %   Neutrophils Absolute 4.1 1 - 7 x10E3/uL   Lymphocytes Absolute 2.2 0 - 3 x10E3/uL   Monocytes Absolute 0.4 0 - 0 x10E3/uL   EOS (ABSOLUTE) 0.2 0.0 - 0.4 x10E3/uL   Basophils Absolute 0.0 0 - 0 x10E3/uL   Immature Granulocytes 0 Not Estab. %   Immature Grans (Abs) 0.0 0.0 - 0.1 x10E3/uL  Comprehensive metabolic panel  Result Value Ref Range   Glucose 89 65 - 99 mg/dL   BUN 12 6 - 24 mg/dL   Creatinine, Ser 0.66 0.57 - 1.00 mg/dL   GFR calc non Af Amer 109 >59 mL/min/1.73   GFR calc Af Amer 126 >59 mL/min/1.73   BUN/Creatinine Ratio 18 9 - 23   Sodium 136 134 - 144 mmol/L   Potassium 4.5 3.5 - 5.2 mmol/L   Chloride 96 96 - 106 mmol/L   CO2 25 20 - 29 mmol/L   Calcium 8.9 8.7 - 10.2 mg/dL   Total Protein 6.7 6.0 - 8.5 g/dL   Albumin 4.5 3.8 - 4.8 g/dL   Globulin, Total 2.2 1.5 - 4.5 g/dL   Albumin/Globulin Ratio 2.0 1.2 - 2.2   Bilirubin Total 0.7 0.0 - 1.2 mg/dL   Alkaline Phosphatase 95 48 - 121 IU/L   AST 92 (H) 0 - 40 IU/L   ALT 115 (H) 0 - 32 IU/L  Lipid Panel w/o Chol/HDL Ratio  Result Value Ref Range   Cholesterol, Total 100 100 - 199 mg/dL   Triglycerides 91 0 - 149 mg/dL   HDL 38 (L) >39 mg/dL   VLDL Cholesterol Cal 18 5 - 40 mg/dL   LDL Chol Calc (NIH) 44 0 - 99 mg/dL  Microalbumin, Urine Waived  Result Value Ref Range   Microalb, Ur Waived 30 (H) 0 - 19 mg/L   Creatinine, Urine Waived 100 10 - 300 mg/dL   Microalb/Creat Ratio <30 <30 mg/g  TSH  Result Value Ref Range   TSH 2.390 0.450 - 4.500 uIU/mL  Urinalysis, Routine w reflex microscopic  Result Value Ref Range   Specific Gravity, UA 1.015 1.005 - 1.030   pH, UA 6.5 5.0 - 7.5   Color, UA Yellow Yellow   Appearance Ur Clear Clear   Leukocytes,UA Negative Negative   Protein,UA Negative Negative/Trace   Glucose, UA Trace (A) Negative   Ketones, UA Negative Negative   RBC, UA Trace (A) Negative   Bilirubin, UA Negative Negative   Urobilinogen, Ur 0.2 0.2 - 1.0 mg/dL    Nitrite, UA Negative Negative   Microscopic Examination See below:   Hepatitis C Antibody  Result Value Ref Range   Hep C Virus Ab <0.1 0.0 - 0.9 s/co ratio  VITAMIN D 25 Hydroxy (Vit-D Deficiency, Fractures)  Result Value Ref Range   Vit D, 25-Hydroxy 51.0 30.0 - 100.0 ng/mL      Assessment & Plan:   Problem List Items Addressed This Visit      Endocrine   Thyroid nodule    Rechecking labs today. Await results. Treat as needed.       Relevant Orders   CBC with Differential/Platelet (Completed)   Comprehensive metabolic panel (Completed)   TSH (Completed)   Type 2 diabetes mellitus with hyperglycemia, without long-term current use  of insulin ([place])    Under good control with A1c of 5.8. Continue current regimen. Continue to monitor. Call with any concerns.       Relevant Orders   Bayer DCA Hb A1c Waived (Completed)   CBC with Differential/Platelet (Completed)   Comprehensive metabolic panel (Completed)   Microalbumin, Urine Waived (Completed)   Urinalysis, Routine w reflex microscopic (Completed)     Nervous and Auditory   Trigeminal neuralgia    Acting up. Discussed increasing nortriptyline. Continue to monitor. Call with any concerns.         Other   Hyperlipidemia    Under good control on current regimen. Continue current regimen. Continue to monitor. Call with any concerns. Refills given. Labs drawn today.       Relevant Orders   CBC with Differential/Platelet (Completed)   Comprehensive metabolic panel (Completed)   Lipid Panel w/o Chol/HDL Ratio (Completed)   Depression    Continue to follow with psychiatry. Call with any concerns. Continue to monitor.       Obesity    Congratulated patient on her loss of 6lbs! Continue to monitor. Continue titrating saxenda as able. Continue to monitor.       Relevant Orders   CBC with Differential/Platelet (Completed)   Comprehensive metabolic panel (Completed)    Other Visit Diagnoses    Routine general  medical examination at a health care facility    -  Primary   Vaccines up to date. Screening labs checked today. Pap up to date. Mammogram ordered today. Continue diet and exercise. Call with any concerns.    Relevant Orders   Bayer DCA Hb A1c Waived (Completed)   CBC with Differential/Platelet (Completed)   Comprehensive metabolic panel (Completed)   Lipid Panel w/o Chol/HDL Ratio (Completed)   Microalbumin, Urine Waived (Completed)   TSH (Completed)   Urinalysis, Routine w reflex microscopic (Completed)   Hepatitis C Antibody (Completed)   VITAMIN D 25 Hydroxy (Vit-D Deficiency, Fractures) (Completed)   Vitamin D deficiency       Labs checked today. Await results. Treat as needed.    Relevant Orders   CBC with Differential/Platelet (Completed)   Comprehensive metabolic panel (Completed)   VITAMIN D 25 Hydroxy (Vit-D Deficiency, Fractures) (Completed)   Elevated LFTs       Labs checked today. Await results. Treat as needed.    Relevant Orders   CBC with Differential/Platelet (Completed)   Comprehensive metabolic panel (Completed)   Need for hepatitis C screening test       Labs checked today. Await results. Treat as needed.        Follow up plan: Return in about 6 months (around 01/06/2021).   LABORATORY TESTING:  - Pap smear: up to date  IMMUNIZATIONS:   - Tdap: Tetanus vaccination status reviewed: last tetanus booster within 10 years. - Influenza: Postponed to flu season - Pneumovax: Up to date - COVID: Up to date  SCREENING: -Mammogram: Ordered today   PATIENT COUNSELING:   Advised to take 1 mg of folate supplement per day if capable of pregnancy.   Sexuality: Discussed sexually transmitted diseases, partner selection, use of condoms, avoidance of unintended pregnancy  and contraceptive alternatives.   Advised to avoid cigarette smoking.  I discussed with the patient that most people either abstain from alcohol or drink within safe limits (<=14/week and <=4  drinks/occasion for males, <=7/weeks and <= 3 drinks/occasion for females) and that the risk for alcohol disorders and other health effects rises proportionally with the  number of drinks per week and how often a drinker exceeds daily limits.  Discussed cessation/primary prevention of drug use and availability of treatment for abuse.   Diet: Encouraged to adjust caloric intake to maintain  or achieve ideal body weight, to reduce intake of dietary saturated fat and total fat, to limit sodium intake by avoiding high sodium foods and not adding table salt, and to maintain adequate dietary potassium and calcium preferably from fresh fruits, vegetables, and low-fat dairy products.    stressed the importance of regular exercise  Injury prevention: Discussed safety belts, safety helmets, smoke detector, smoking near bedding or upholstery.   Dental health: Discussed importance of regular tooth brushing, flossing, and dental visits.    NEXT PREVENTATIVE PHYSICAL DUE IN 1 YEAR. Return in about 6 months (around 01/06/2021).

## 2020-07-07 LAB — LIPID PANEL W/O CHOL/HDL RATIO
Cholesterol, Total: 100 mg/dL (ref 100–199)
HDL: 38 mg/dL — ABNORMAL LOW (ref >39)
LDL Chol Calc (NIH): 44 mg/dL (ref 0–99)
Triglycerides: 91 mg/dL (ref 0–149)
VLDL Cholesterol Cal: 18 mg/dL (ref 5–40)

## 2020-07-07 LAB — VITAMIN D 25 HYDROXY (VIT D DEFICIENCY, FRACTURES): Vit D, 25-Hydroxy: 51 ng/mL (ref 30.0–100.0)

## 2020-07-07 LAB — COMPREHENSIVE METABOLIC PANEL
ALT: 115 IU/L — ABNORMAL HIGH (ref 0–32)
AST: 92 IU/L — ABNORMAL HIGH (ref 0–40)
Albumin/Globulin Ratio: 2 (ref 1.2–2.2)
Albumin: 4.5 g/dL (ref 3.8–4.8)
Alkaline Phosphatase: 95 IU/L (ref 48–121)
BUN/Creatinine Ratio: 18 (ref 9–23)
BUN: 12 mg/dL (ref 6–24)
Bilirubin Total: 0.7 mg/dL (ref 0.0–1.2)
CO2: 25 mmol/L (ref 20–29)
Calcium: 8.9 mg/dL (ref 8.7–10.2)
Chloride: 96 mmol/L (ref 96–106)
Creatinine, Ser: 0.66 mg/dL (ref 0.57–1.00)
GFR calc Af Amer: 126 mL/min/{1.73_m2} (ref >59)
GFR calc non Af Amer: 109 mL/min/{1.73_m2} (ref >59)
Globulin, Total: 2.2 g/dL (ref 1.5–4.5)
Glucose: 89 mg/dL (ref 65–99)
Potassium: 4.5 mmol/L (ref 3.5–5.2)
Sodium: 136 mmol/L (ref 134–144)
Total Protein: 6.7 g/dL (ref 6.0–8.5)

## 2020-07-07 LAB — CBC WITH DIFFERENTIAL/PLATELET
Basophils Absolute: 0 10*3/uL (ref 0.0–0.2)
Basos: 0 %
EOS (ABSOLUTE): 0.2 10*3/uL (ref 0.0–0.4)
Eos: 3 %
Hematocrit: 39.6 % (ref 34.0–46.6)
Hemoglobin: 13 g/dL (ref 11.1–15.9)
Immature Grans (Abs): 0 10*3/uL (ref 0.0–0.1)
Immature Granulocytes: 0 %
Lymphocytes Absolute: 2.2 10*3/uL (ref 0.7–3.1)
Lymphs: 32 %
MCH: 30.9 pg (ref 26.6–33.0)
MCHC: 32.8 g/dL (ref 31.5–35.7)
MCV: 94 fL (ref 79–97)
Monocytes Absolute: 0.4 10*3/uL (ref 0.1–0.9)
Monocytes: 6 %
Neutrophils Absolute: 4.1 10*3/uL (ref 1.4–7.0)
Neutrophils: 59 %
Platelets: 203 10*3/uL (ref 150–450)
RBC: 4.21 x10E6/uL (ref 3.77–5.28)
RDW: 12.5 % (ref 11.7–15.4)
WBC: 6.9 10*3/uL (ref 3.4–10.8)

## 2020-07-07 LAB — HEPATITIS C ANTIBODY: Hep C Virus Ab: <0.1 s/co ratio (ref 0.0–0.9)

## 2020-07-07 LAB — TSH: TSH: 2.39 u[IU]/mL (ref 0.450–4.500)

## 2020-07-11 NOTE — Assessment & Plan Note (Signed)
Continue to follow with psychiatry. Call with any concerns. Continue to monitor.  

## 2020-07-11 NOTE — Assessment & Plan Note (Signed)
Under good control on current regimen. Continue current regimen. Continue to monitor. Call with any concerns. Refills given. Labs drawn today.   

## 2020-07-11 NOTE — Assessment & Plan Note (Signed)
Under good control with A1c of 5.8. Continue current regimen. Continue to monitor. Call with any concerns.  

## 2020-07-11 NOTE — Assessment & Plan Note (Signed)
Acting up. Discussed increasing nortriptyline. Continue to monitor. Call with any concerns.

## 2020-07-11 NOTE — Assessment & Plan Note (Signed)
Congratulated patient on her loss of 6lbs! Continue to monitor. Continue titrating saxenda as able. Continue to monitor.

## 2020-07-11 NOTE — Assessment & Plan Note (Signed)
Rechecking labs today. Await results. Treat as needed.  °

## 2020-07-20 ENCOUNTER — Encounter: Payer: Self-pay | Admitting: Family Medicine

## 2020-07-20 ENCOUNTER — Other Ambulatory Visit: Payer: Self-pay

## 2020-07-20 ENCOUNTER — Ambulatory Visit (INDEPENDENT_AMBULATORY_CARE_PROVIDER_SITE_OTHER): Payer: No Typology Code available for payment source | Admitting: Family Medicine

## 2020-07-20 VITALS — BP 112/78 | HR 91 | Temp 98.2°F | Wt 166.4 lb

## 2020-07-20 DIAGNOSIS — L600 Ingrowing nail: Secondary | ICD-10-CM | POA: Diagnosis not present

## 2020-07-20 NOTE — Patient Instructions (Signed)
Fingernail or Toenail Removal, Adult, Care After This sheet gives you information about how to care for yourself after your procedure. Your health care provider may also give you more specific instructions. If you have problems or questions, contact your health care provider. What can I expect after the procedure? After the procedure, it is common to have:  Pain.  Redness.  Swelling.  Soreness. Follow these instructions at home: Medicines  Take over-the-counter and prescription medicines only as told by your health care provider.  If you were prescribed an antibiotic medicine, take or apply it as told by your health care provider. Do not stop using the antibiotic even if you start to feel better. Wound care  Follow instructions from your health care provider about how to take care of your wound. Make sure you: ? Wash your hands with soap and water for at least 20 seconds before and after you change your bandage (dressing). If soap and water are not available, use hand sanitizer. ? Change your dressing as told by your health care provider. ? Keep your dressing dry until your health care provider says it can be removed.  Check your wound every day for signs of infection. Check for: ? More redness, swelling, or pain. ? More fluid or blood. ? Warmth. ? Pus or a bad smell. If you have a splint:   Wear the splint as told by your health care provider. Remove it only as told by your health care provider.  Loosen the splint if your fingers tingle, become numb, or turn cold and blue.  Keep the splint clean.  If the splint is not waterproof: ? Do not let it get wet. ? Cover it with a watertight covering when you take a bath or a shower. Managing pain, stiffness, and swelling  Move your fingers or toes often to reduce stiffness and swelling.  Raise (elevate) the injured area above the level of your heart while you are sitting or lying down. You may need to keep your hand or foot  raised or supported on a pillow for 24 hours or as told by your health care provider. General instructions  If you were given a shoe to wear, wear it as told by your health care provider.  Keep all follow-up visits as told by your health care provider. This is important. Contact a health care provider if:  You have more redness, swelling, or pain around your wound.  You have more fluid or blood coming from your wound.  You have pus or a bad smell coming from your wound.  Your wound feels warm to the touch.  You have a fever. Get help right away if:  Your finger or toe looks pale, blue, or black.  You are not able to move your finger or toe. Summary  After the procedure, it is common to have pain and swelling.  Keep the hand or foot raised (elevated) or supported on a pillow as told by your health care provider.  Take over-the-counter and prescription medicines only as told by your health care provider.  Check your wound every day for signs of infection. This information is not intended to replace advice given to you by your health care provider. Make sure you discuss any questions you have with your health care provider. Document Revised: 06/30/2019 Document Reviewed: 06/30/2019 Elsevier Patient Education  [place].

## 2020-07-20 NOTE — Progress Notes (Signed)
BP 112/78 (BP Location: Left Arm, Patient Position: Sitting, Cuff Size: Normal)   Pulse 91   Temp 98.2 F (36.8 C) (Oral)   Wt 166 lb 6.4 oz (75.5 kg)   SpO2 100%   BMI 33.32 kg/m    Subjective:    Patient ID: Ashley Wiley, female    DOB: December 11, 1975, 44 y.o.   MRN: [id number]  HPI: Ashley Wiley is a 44 y.o. female  Chief Complaint  Patient presents with  . Ingrown Toenail   TOE PAIN Duration: end of July Involved toe: bilateral big toe  Mechanism of injury: unknown Onset: gradual Severity: moderate  Quality: aching and sore Frequency: constant Radiation: no Aggravating factors: weight bearing, shoes   Alleviating factors: nothing   Status: better Treatments attempted: none   Relief with NSAIDs?: mild Morning stiffness: no Redness: no  Bruising: no Swelling: yes Paresthesias / decreased sensation: no Fevers: no  Relevant past medical, surgical, family and social history reviewed and updated as indicated. Interim medical history since our last visit reviewed. Allergies and medications reviewed and updated.  Review of Systems  Constitutional: Negative.   Respiratory: Negative.   Cardiovascular: Negative.   Gastrointestinal: Negative.   Musculoskeletal: Negative.   Skin: Negative.   Psychiatric/Behavioral: Negative.     Per HPI unless specifically indicated above     Objective:    BP 112/78 (BP Location: Left Arm, Patient Position: Sitting, Cuff Size: Normal)   Pulse 91   Temp 98.2 F (36.8 C) (Oral)   Wt 166 lb 6.4 oz (75.5 kg)   SpO2 100%   BMI 33.32 kg/m   Wt Readings from Last 3 Encounters:  07/20/20 166 lb 6.4 oz (75.5 kg)  07/06/20 164 lb 12.8 oz (74.8 kg)  06/02/20 168 lb (76.2 kg)    Physical Exam Vitals and nursing note reviewed.  Constitutional:      General: She is not in acute distress.    Appearance: Normal appearance. She is not ill-appearing, toxic-appearing or diaphoretic.  HENT:     Head: Normocephalic and  atraumatic.     Right Ear: External ear normal.     Left Ear: External ear normal.     Nose: Nose normal.     Mouth/Throat:     Mouth: Mucous membranes are moist.     Pharynx: Oropharynx is clear.  Eyes:     General: No scleral icterus.       Right eye: No discharge.        Left eye: No discharge.     Extraocular Movements: Extraocular movements intact.     Conjunctiva/sclera: Conjunctivae normal.     Pupils: Pupils are equal, round, and reactive to light.  Cardiovascular:     Rate and Rhythm: Normal rate and regular rhythm.     Pulses: Normal pulses.     Heart sounds: Normal heart sounds. No murmur heard.  No friction rub. No gallop.   Pulmonary:     Effort: Pulmonary effort is normal. No respiratory distress.     Breath sounds: Normal breath sounds. No stridor. No wheezing, rhonchi or rales.  Chest:     Chest wall: No tenderness.  Musculoskeletal:        General: Normal range of motion.     Cervical back: Normal range of motion and neck supple.  Skin:    General: Skin is warm and dry.     Capillary Refill: Capillary refill takes less than 2 seconds.     Coloration: Skin  is not jaundiced or pale.     Findings: No bruising, erythema, lesion or rash.     Comments: Ingrown great toenails on L medial border and R bilateral borders  Neurological:     General: No focal deficit present.     Mental Status: She is alert and oriented to person, place, and time. Mental status is at baseline.  Psychiatric:        Mood and Affect: Mood normal.        Behavior: Behavior normal.        Thought Content: Thought content normal.        Judgment: Judgment normal.     Results for orders placed or performed in visit on 07/06/20  Microscopic Examination   Urine  Result Value Ref Range   WBC, UA None seen 0 - 5 /hpf   RBC 0-2 0 - 2 /hpf   Epithelial Cells (non renal) 0-10 0 - 10 /hpf   Bacteria, UA Few (A) None seen/Few  Bayer DCA Hb A1c Waived  Result Value Ref Range   HB A1C (BAYER  DCA - WAIVED) 5.8 <7.0 %  CBC with Differential/Platelet  Result Value Ref Range   WBC 6.9 3.4 - 10.8 x10E3/uL   RBC 4.21 3.77 - 5.28 x10E6/uL   Hemoglobin 13.0 11.1 - 15.9 g/dL   Hematocrit 39.6 34.0 - 46.6 %   MCV 94 79 - 97 fL   MCH 30.9 26.6 - 33.0 pg   MCHC 32.8 31 - 35 g/dL   RDW 12.5 11.7 - 15.4 %   Platelets 203 150 - 450 x10E3/uL   Neutrophils 59 Not Estab. %   Lymphs 32 Not Estab. %   Monocytes 6 Not Estab. %   Eos 3 Not Estab. %   Basos 0 Not Estab. %   Neutrophils Absolute 4.1 1 - 7 x10E3/uL   Lymphocytes Absolute 2.2 0 - 3 x10E3/uL   Monocytes Absolute 0.4 0 - 0 x10E3/uL   EOS (ABSOLUTE) 0.2 0.0 - 0.4 x10E3/uL   Basophils Absolute 0.0 0 - 0 x10E3/uL   Immature Granulocytes 0 Not Estab. %   Immature Grans (Abs) 0.0 0.0 - 0.1 x10E3/uL  Comprehensive metabolic panel  Result Value Ref Range   Glucose 89 65 - 99 mg/dL   BUN 12 6 - 24 mg/dL   Creatinine, Ser 0.66 0.57 - 1.00 mg/dL   GFR calc non Af Amer 109 >59 mL/min/1.73   GFR calc Af Amer 126 >59 mL/min/1.73   BUN/Creatinine Ratio 18 9 - 23   Sodium 136 134 - 144 mmol/L   Potassium 4.5 3.5 - 5.2 mmol/L   Chloride 96 96 - 106 mmol/L   CO2 25 20 - 29 mmol/L   Calcium 8.9 8.7 - 10.2 mg/dL   Total Protein 6.7 6.0 - 8.5 g/dL   Albumin 4.5 3.8 - 4.8 g/dL   Globulin, Total 2.2 1.5 - 4.5 g/dL   Albumin/Globulin Ratio 2.0 1.2 - 2.2   Bilirubin Total 0.7 0.0 - 1.2 mg/dL   Alkaline Phosphatase 95 48 - 121 IU/L   AST 92 (H) 0 - 40 IU/L   ALT 115 (H) 0 - 32 IU/L  Lipid Panel w/o Chol/HDL Ratio  Result Value Ref Range   Cholesterol, Total 100 100 - 199 mg/dL   Triglycerides 91 0 - 149 mg/dL   HDL 38 (L) >39 mg/dL   VLDL Cholesterol Cal 18 5 - 40 mg/dL   LDL Chol Calc (NIH) 44 0 - 99  mg/dL  Microalbumin, Urine Waived  Result Value Ref Range   Microalb, Ur Waived 30 (H) 0 - 19 mg/L   Creatinine, Urine Waived 100 10 - 300 mg/dL   Microalb/Creat Ratio <30 <30 mg/g  TSH  Result Value Ref Range   TSH 2.390 0.450 -  4.500 uIU/mL  Urinalysis, Routine w reflex microscopic  Result Value Ref Range   Specific Gravity, UA 1.015 1.005 - 1.030   pH, UA 6.5 5.0 - 7.5   Color, UA Yellow Yellow   Appearance Ur Clear Clear   Leukocytes,UA Negative Negative   Protein,UA Negative Negative/Trace   Glucose, UA Trace (A) Negative   Ketones, UA Negative Negative   RBC, UA Trace (A) Negative   Bilirubin, UA Negative Negative   Urobilinogen, Ur 0.2 0.2 - 1.0 mg/dL   Nitrite, UA Negative Negative   Microscopic Examination See below:   Hepatitis C Antibody  Result Value Ref Range   Hep C Virus Ab <0.1 0.0 - 0.9 s/co ratio  VITAMIN D 25 Hydroxy (Vit-D Deficiency, Fractures)  Result Value Ref Range   Vit D, 25-Hydroxy 51.0 30.0 - 100.0 ng/mL      Assessment & Plan:   Problem List Items Addressed This Visit    None    Visit Diagnoses    Ingrown toenail of both feet    -  Primary   L toenail removed today as below.       Procedure: Partial Toenail removal with Matrix Diagnosis:    ICD-10-CM   1. Ingrown toenail of both feet  L60.0    L toenail removed today as below.    Physican: Park Liter, DO Consent: Risks, benefits, and alternative treatments discussed and all questions were answered.  Patient elected to proceed and verbal consent obtained Description:  Area prepped and draped using  sterile technique. Digital block of the  Left  1st toe performed by injecting local anesthetic at the base of the toe at the 2 oclock and 10 oclock positions, using 10 cc's of  1% lidocaine plain. After confirming adequate anesthesia, medial nail folds and epinychia were freed up using periosteal elevator.  Using scissors the nail was vertically cut beyond the epinychia to the base.  A hemostat was then used to remove the nail fragment. The nail was grasped using a hemostat and the nail was removed intact. Phenol was applied to the nail matrix x 3 using a cotton applicator tip.   Bacitracin ointment was applied to the  operative site a circumferential gauze dressive was applied.  The patient tolerated the procedure well.  Complications: none Estimated Blood Loss: minimal Post Procedure Instructions: The patient was encouraged to keep the dressing in place for 24 hours and keep the foot elevated as much as possible during this time.  After the first day they are instructed to soak the toe in warm water 3 times daily for 3-4 days.  Antibiotic ointment is to be applied daily for 1 week.  The patient was informed that some oozing is to be expected for 1-2 weeks but that they should return immediately for pus, increased pain or redness.  They were instructed to take APAP or motrin as needed for post operative discomfort.     Follow up plan: Return if symptoms worsen or fail to improve.

## 2020-07-30 ENCOUNTER — Encounter: Payer: Self-pay | Admitting: Family Medicine

## 2020-07-30 ENCOUNTER — Ambulatory Visit: Payer: No Typology Code available for payment source | Admitting: Family Medicine

## 2020-07-30 ENCOUNTER — Telehealth (INDEPENDENT_AMBULATORY_CARE_PROVIDER_SITE_OTHER): Payer: No Typology Code available for payment source | Admitting: Family Medicine

## 2020-07-30 DIAGNOSIS — E559 Vitamin D deficiency, unspecified: Secondary | ICD-10-CM | POA: Diagnosis not present

## 2020-07-30 DIAGNOSIS — M2550 Pain in unspecified joint: Secondary | ICD-10-CM

## 2020-07-30 DIAGNOSIS — L03032 Cellulitis of left toe: Secondary | ICD-10-CM | POA: Diagnosis not present

## 2020-07-30 DIAGNOSIS — E041 Nontoxic single thyroid nodule: Secondary | ICD-10-CM | POA: Diagnosis not present

## 2020-07-30 MED ORDER — SULFAMETHOXAZOLE-TRIMETHOPRIM 800-160 MG PO TABS: 1.0000 | ORAL_TABLET | Freq: Two times a day (BID) | ORAL | 0 refills | Status: DC

## 2020-07-30 NOTE — Assessment & Plan Note (Signed)
Rechecking levels today. Await results. Treat as needed.  

## 2020-07-30 NOTE — Progress Notes (Signed)
There were no vitals taken for this visit.   Subjective:    Patient ID: Ashley Wiley, female    DOB: 1976/06/09, 44 y.o.   MRN: [id number]  HPI: Ashley Wiley is a 44 y.o. female  Chief Complaint  Patient presents with  . Pain   Continues with widespread pain. She tried the 2 caps of the nortriptyline without benefit. Now with more facial pain. Pain in the face is worse and is there most of the time. Most of the time it's a low level, but sometimes it gets up to an intense high level. She feels like it has been giving her more headaches. Pains in her joints has been going on for years, but has been getting worse. Has some pains in her upper back and low back as well. Bilateral ankles and shoulders are the worst.   ARTHRALGIAS / JOINT ACHES Duration: months to years Pain: yes Symmetric: yes Severity: moderate Quality: aching and sore Frequency: constant Context:  worse Decreased function/range of motion: no  Erythema: no Swelling: yes Heat or warmth: no Morning stiffness: no Relief with NSAIDs?: no Treatments attempted:  rest, ice, heat, APAP, ibuprofen and aleve  Involved Joints:     Hands: yes bilateral    Wrists: yes bilateral     Elbows: yes bilateral    Shoulders: yes bilateral    Back: yes     Hips: yes bilateral    Knees: no     Ankles: yes bilateral    Feet: no   SKIN INFECTION Duration: 3 days Location: Big toe History of trauma in area: yes Pain: yes Quality: aching and sore Severity: moderate Redness: yes Swelling: yes Oozing: no Pus: no Fevers: no Nausea/vomiting: no Status: worse Treatments attempted:warm compresses  Tetanus: UTD  Relevant past medical, surgical, family and social history reviewed and updated as indicated. Interim medical history since our last visit reviewed. Allergies and medications reviewed and updated.  Review of Systems  Constitutional: Positive for fatigue. Negative for activity change, appetite change,  chills, diaphoresis, fever and unexpected weight change.  HENT: Negative.   Respiratory: Negative.   Cardiovascular: Negative.   Gastrointestinal: Negative.   Musculoskeletal: Positive for arthralgias, back pain, joint swelling and myalgias. Negative for gait problem, neck pain and neck stiffness.  Skin: Negative.   Neurological: Negative.   Psychiatric/Behavioral: Negative.     Per HPI unless specifically indicated above     Objective:    There were no vitals taken for this visit.  Wt Readings from Last 3 Encounters:  07/20/20 166 lb 6.4 oz (75.5 kg)  07/06/20 164 lb 12.8 oz (74.8 kg)  06/02/20 168 lb (76.2 kg)    Physical Exam Vitals and nursing note reviewed.  Constitutional:      General: She is not in acute distress.    Appearance: Normal appearance. She is not ill-appearing, toxic-appearing or diaphoretic.  HENT:     Head: Normocephalic and atraumatic.     Right Ear: External ear normal.     Left Ear: External ear normal.     Nose: Nose normal.     Mouth/Throat:     Mouth: Mucous membranes are moist.     Pharynx: Oropharynx is clear.  Eyes:     General: No scleral icterus.       Right eye: No discharge.        Left eye: No discharge.     Conjunctiva/sclera: Conjunctivae normal.     Pupils: Pupils are equal, round,  and reactive to light.  Pulmonary:     Effort: Pulmonary effort is normal. No respiratory distress.     Comments: Speaking in full sentences Musculoskeletal:        General: Normal range of motion.     Cervical back: Normal range of motion.  Skin:    Coloration: Skin is not jaundiced or pale.     Findings: No bruising, erythema, lesion or rash.  Neurological:     Mental Status: She is alert and oriented to person, place, and time. Mental status is at baseline.  Psychiatric:        Mood and Affect: Mood normal.        Behavior: Behavior normal.        Thought Content: Thought content normal.        Judgment: Judgment normal.     Results for  orders placed or performed in visit on 07/06/20  Microscopic Examination   Urine  Result Value Ref Range   WBC, UA None seen 0 - 5 /hpf   RBC 0-2 0 - 2 /hpf   Epithelial Cells (non renal) 0-10 0 - 10 /hpf   Bacteria, UA Few (A) None seen/Few  Bayer DCA Hb A1c Waived  Result Value Ref Range   HB A1C (BAYER DCA - WAIVED) 5.8 <7.0 %  CBC with Differential/Platelet  Result Value Ref Range   WBC 6.9 3.4 - 10.8 x10E3/uL   RBC 4.21 3.77 - 5.28 x10E6/uL   Hemoglobin 13.0 11.1 - 15.9 g/dL   Hematocrit 39.6 34.0 - 46.6 %   MCV 94 79 - 97 fL   MCH 30.9 26.6 - 33.0 pg   MCHC 32.8 31 - 35 g/dL   RDW 12.5 11.7 - 15.4 %   Platelets 203 150 - 450 x10E3/uL   Neutrophils 59 Not Estab. %   Lymphs 32 Not Estab. %   Monocytes 6 Not Estab. %   Eos 3 Not Estab. %   Basos 0 Not Estab. %   Neutrophils Absolute 4.1 1 - 7 x10E3/uL   Lymphocytes Absolute 2.2 0 - 3 x10E3/uL   Monocytes Absolute 0.4 0 - 0 x10E3/uL   EOS (ABSOLUTE) 0.2 0.0 - 0.4 x10E3/uL   Basophils Absolute 0.0 0 - 0 x10E3/uL   Immature Granulocytes 0 Not Estab. %   Immature Grans (Abs) 0.0 0.0 - 0.1 x10E3/uL  Comprehensive metabolic panel  Result Value Ref Range   Glucose 89 65 - 99 mg/dL   BUN 12 6 - 24 mg/dL   Creatinine, Ser 0.66 0.57 - 1.00 mg/dL   GFR calc non Af Amer 109 >59 mL/min/1.73   GFR calc Af Amer 126 >59 mL/min/1.73   BUN/Creatinine Ratio 18 9 - 23   Sodium 136 134 - 144 mmol/L   Potassium 4.5 3.5 - 5.2 mmol/L   Chloride 96 96 - 106 mmol/L   CO2 25 20 - 29 mmol/L   Calcium 8.9 8.7 - 10.2 mg/dL   Total Protein 6.7 6.0 - 8.5 g/dL   Albumin 4.5 3.8 - 4.8 g/dL   Globulin, Total 2.2 1.5 - 4.5 g/dL   Albumin/Globulin Ratio 2.0 1.2 - 2.2   Bilirubin Total 0.7 0.0 - 1.2 mg/dL   Alkaline Phosphatase 95 48 - 121 IU/L   AST 92 (H) 0 - 40 IU/L   ALT 115 (H) 0 - 32 IU/L  Lipid Panel w/o Chol/HDL Ratio  Result Value Ref Range   Cholesterol, Total 100 100 - 199 mg/dL   Triglycerides  91 0 - 149 mg/dL   HDL 38 (L) >39  mg/dL   VLDL Cholesterol Cal 18 5 - 40 mg/dL   LDL Chol Calc (NIH) 44 0 - 99 mg/dL  Microalbumin, Urine Waived  Result Value Ref Range   Microalb, Ur Waived 30 (H) 0 - 19 mg/L   Creatinine, Urine Waived 100 10 - 300 mg/dL   Microalb/Creat Ratio <30 <30 mg/g  TSH  Result Value Ref Range   TSH 2.390 0.450 - 4.500 uIU/mL  Urinalysis, Routine w reflex microscopic  Result Value Ref Range   Specific Gravity, UA 1.015 1.005 - 1.030   pH, UA 6.5 5.0 - 7.5   Color, UA Yellow Yellow   Appearance Ur Clear Clear   Leukocytes,UA Negative Negative   Protein,UA Negative Negative/Trace   Glucose, UA Trace (A) Negative   Ketones, UA Negative Negative   RBC, UA Trace (A) Negative   Bilirubin, UA Negative Negative   Urobilinogen, Ur 0.2 0.2 - 1.0 mg/dL   Nitrite, UA Negative Negative   Microscopic Examination See below:   Hepatitis C Antibody  Result Value Ref Range   Hep C Virus Ab <0.1 0.0 - 0.9 s/co ratio  VITAMIN D 25 Hydroxy (Vit-D Deficiency, Fractures)  Result Value Ref Range   Vit D, 25-Hydroxy 51.0 30.0 - 100.0 ng/mL      Assessment & Plan:   Problem List Items Addressed This Visit      Endocrine   Thyroid nodule    Rechecking levels today. Await results. Treat as needed.       Relevant Orders   TSH     Other   Vitamin D deficiency disease    Rechecking levels today. Await results. Treat as needed.       Relevant Orders   VITAMIN D 25 Hydroxy (Vit-D Deficiency, Fractures)    Other Visit Diagnoses    Arthralgia, unspecified joint    -  Primary   Discussed treatment options and diagnostics. Will check labs and possibly x-ray.Will discuss changing zoloft to cymbalta with psych. Call with concerns.    Relevant Orders   VITAMIN D 25 Hydroxy (Vit-D Deficiency, Fractures)   CBC with Differential/Platelet   Comprehensive metabolic panel   TSH   Uric acid   CK (Creatine Kinase)   Sed Rate (ESR)   Babesia microti Antibody Panel   Ehrlichia Antibody Panel   Rocky mtn  spotted fvr abs pnl(IgG+IgM)   Lyme Ab/Western Blot Reflex   Antinuclear Antib (ANA)   Cellulitis of great toe of left foot       Will treat with bactrim. Call with any concerns or if not getting better.        Follow up plan: Return if symptoms worsen or fail to improve.   . This visit was completed via MyChart due to the restrictions of the COVID-19 pandemic. All issues as above were discussed and addressed. Physical exam was done as above through visual confirmation on MyChart. If it was felt that the patient should be evaluated in the office, they were directed there. The patient verbally consented to this visit. . Location of the patient: parking lot . Location of the provider: home . Those involved with this call:  . Provider: Park Liter, DO . CMA: Lauretta Grill, RMA . Front Desk/Registration: Don Perking  . Time spent on call: 25 minutes with patient face to face via video conference. More than 50% of this time was spent in counseling and coordination of care. [place]  minutes total spent in review of patient's record and preparation of their chart.

## 2020-08-02 ENCOUNTER — Other Ambulatory Visit: Payer: Self-pay | Admitting: Family Medicine

## 2020-08-02 NOTE — Telephone Encounter (Signed)
Requested medication (s) are due for refill today -unknown  Requested medication (s) are on the active medication list -yes  Future visit scheduled -yes  Last refill: 06/28/20  Notes to clinic: Patient is requesting RF of supplies from historical provider  Requested Prescriptions  Pending Prescriptions Disp [place] X 5 MM [place] [Pharmacy Med Name: UNIFINE PENTIPS 31GX3/16" 31G X 5 MM Miscellaneous] 100 each 1    Sig: USE AS DIRECTED WITH SAXENDA      Endocrinology: Diabetes - Testing Supplies Passed - 08/02/2020 12:22 PM      Passed - Valid encounter within last 12 months    Recent Outpatient Visits           3 days ago Arthralgia, unspecified joint   [place], Megan P, DO   1 week ago Ingrown toenail of both feet   [place], Megan P, DO   3 weeks ago Routine general medical examination at a health care facility   [facility], [place], DO   2 months ago Class 1 obesity due to excess calories with serious comorbidity and body mass index (BMI) of 33.0 to 33.9 in adult   [facility], Megan P, DO   4 months ago Type 2 diabetes mellitus with hyperglycemia, without long-term current use of insulin ([place])   [place], [place], DO       Future Appointments             In 5 months Wynetta Emery, Barb Merino, DO [place], [place]                Requested Prescriptions  Pending Prescriptions Disp Refills   UNIFINE PENTIPS 31G X 5 MM [place] [Pharmacy Med Name: UNIFINE PENTIPS 31GX3/16" 31G X 5 MM Miscellaneous] 100 each 1    Sig: USE AS DIRECTED WITH SAXENDA      Endocrinology: Diabetes - Testing Supplies Passed - 08/02/2020 12:22 PM      Passed - Valid encounter within last 12 months    Recent Outpatient Visits           3 days ago Arthralgia, unspecified joint   [place], Megan P, DO   1 week ago Ingrown toenail of  both feet   [place], Megan P, DO   3 weeks ago Routine general medical examination at a health care facility   [facility], [place], DO   2 months ago Class 1 obesity due to excess calories with serious comorbidity and body mass index (BMI) of 33.0 to 33.9 in adult   [facility], Megan P, DO   4 months ago Type 2 diabetes mellitus with hyperglycemia, without long-term current use of insulin [facility])   [place], [place], DO       Future Appointments             In 5 months Wynetta Emery, Barb Merino, DO [facility] [employer]

## 2020-08-04 ENCOUNTER — Other Ambulatory Visit: Payer: No Typology Code available for payment source

## 2020-08-04 ENCOUNTER — Other Ambulatory Visit: Payer: Self-pay

## 2020-08-04 DIAGNOSIS — M2550 Pain in unspecified joint: Secondary | ICD-10-CM

## 2020-08-04 DIAGNOSIS — E041 Nontoxic single thyroid nodule: Secondary | ICD-10-CM

## 2020-08-04 DIAGNOSIS — E559 Vitamin D deficiency, unspecified: Secondary | ICD-10-CM

## 2020-08-06 LAB — CBC WITH DIFFERENTIAL/PLATELET
Basophils Absolute: 0 10*3/uL (ref 0.0–0.2)
Basos: 1 %
EOS (ABSOLUTE): 0.2 10*3/uL (ref 0.0–0.4)
Eos: 2 %
Hematocrit: 37.9 % (ref 34.0–46.6)
Hemoglobin: 12.6 g/dL (ref 11.1–15.9)
Immature Grans (Abs): 0 10*3/uL (ref 0.0–0.1)
Immature Granulocytes: 0 %
Lymphocytes Absolute: 2.1 10*3/uL (ref 0.7–3.1)
Lymphs: 27 %
MCH: 30.7 pg (ref 26.6–33.0)
MCHC: 33.2 g/dL (ref 31.5–35.7)
MCV: 92 fL (ref 79–97)
Monocytes Absolute: 0.4 10*3/uL (ref 0.1–0.9)
Monocytes: 6 %
Neutrophils Absolute: 5.1 10*3/uL (ref 1.4–7.0)
Neutrophils: 64 %
Platelets: 220 10*3/uL (ref 150–450)
RBC: 4.1 x10E6/uL (ref 3.77–5.28)
RDW: 12.3 % (ref 11.7–15.4)
WBC: 7.9 10*3/uL (ref 3.4–10.8)

## 2020-08-06 LAB — COMPREHENSIVE METABOLIC PANEL
ALT: 103 IU/L — ABNORMAL HIGH (ref 0–32)
AST: 100 IU/L — ABNORMAL HIGH (ref 0–40)
Albumin/Globulin Ratio: 2 (ref 1.2–2.2)
Albumin: 4.5 g/dL (ref 3.8–4.8)
Alkaline Phosphatase: 108 IU/L (ref 44–121)
BUN/Creatinine Ratio: 12 (ref 9–23)
BUN: 7 mg/dL (ref 6–24)
Bilirubin Total: 0.7 mg/dL (ref 0.0–1.2)
CO2: 22 mmol/L (ref 20–29)
Calcium: 9.3 mg/dL (ref 8.7–10.2)
Chloride: 97 mmol/L (ref 96–106)
Creatinine, Ser: 0.6 mg/dL (ref 0.57–1.00)
GFR calc Af Amer: 130 mL/min/{1.73_m2} (ref >59)
GFR calc non Af Amer: 113 mL/min/{1.73_m2} (ref >59)
Globulin, Total: 2.2 g/dL (ref 1.5–4.5)
Glucose: 93 mg/dL (ref 65–99)
Potassium: 4.4 mmol/L (ref 3.5–5.2)
Sodium: 134 mmol/L (ref 134–144)
Total Protein: 6.7 g/dL (ref 6.0–8.5)

## 2020-08-06 LAB — TSH: TSH: 2.28 u[IU]/mL (ref 0.450–4.500)

## 2020-08-06 LAB — BABESIA MICROTI ANTIBODY PANEL
Babesia microti IgG: <1:10 {titer}
Babesia microti IgM: <1:10 {titer}

## 2020-08-06 LAB — VITAMIN D 25 HYDROXY (VIT D DEFICIENCY, FRACTURES): Vit D, 25-Hydroxy: 52.2 ng/mL (ref 30.0–100.0)

## 2020-08-06 LAB — EHRLICHIA ANTIBODY PANEL
E. Chaffeensis (HME) IgM Titer: NEGATIVE
E.Chaffeensis (HME) IgG: NEGATIVE
HGE IgG Titer: NEGATIVE
HGE IgM Titer: NEGATIVE

## 2020-08-06 LAB — SEDIMENTATION RATE: Sed Rate: 31 mm/hr (ref 0–32)

## 2020-08-06 LAB — LYME AB/WESTERN BLOT REFLEX
LYME DISEASE AB, QUANT, IGM: <0.8 index (ref 0.00–0.79)
Lyme IgG/IgM Ab: <0.91 {ISR} (ref 0.00–0.90)

## 2020-08-06 LAB — CK: Total CK: 66 U/L (ref 32–182)

## 2020-08-06 LAB — ROCKY MTN SPOTTED FVR ABS PNL(IGG+IGM)
RMSF IgG: NEGATIVE
RMSF IgM: 0.56 index (ref 0.00–0.89)

## 2020-08-06 LAB — URIC ACID: Uric Acid: 4.9 mg/dL (ref 2.6–6.2)

## 2020-08-06 LAB — ANA: Anti Nuclear Antibody (ANA): NEGATIVE

## 2020-08-11 ENCOUNTER — Encounter: Payer: Self-pay | Admitting: Family Medicine

## 2020-08-27 ENCOUNTER — Ambulatory Visit (INDEPENDENT_AMBULATORY_CARE_PROVIDER_SITE_OTHER): Payer: No Typology Code available for payment source | Admitting: Family Medicine

## 2020-08-27 ENCOUNTER — Other Ambulatory Visit: Payer: Self-pay

## 2020-08-27 DIAGNOSIS — Z23 Encounter for immunization: Secondary | ICD-10-CM

## 2020-08-31 NOTE — Progress Notes (Signed)
error 

## 2020-09-03 ENCOUNTER — Other Ambulatory Visit: Payer: Self-pay | Admitting: Family Medicine

## 2020-09-03 NOTE — Telephone Encounter (Signed)
Requested medication (s) are due for refill today:  Yes  Requested medication (s) are on the active medication list:  Yes  Future visit scheduled:  Yes  Last Refill: Historical Provider  Requested Prescriptions  Pending Prescriptions Disp Refills   glucose blood (FREESTYLE LITE) test strip [Pharmacy Med Name: FREESTYLE LITE TEST STRIP Strip] 100 strip 12    Sig: [place]      Endocrinology: Diabetes - Testing Supplies Passed - 09/03/2020  2:20 PM      Passed - Valid encounter within last 12 months    Recent Outpatient Visits           1 week ago Need for immunization against influenza   [place], Megan P, DO   1 month ago Arthralgia, unspecified joint   [place], Megan P, DO   1 month ago Ingrown toenail of both feet   [facility] [place], Megan P, DO   1 month ago Routine general medical examination at a health care facility   [facility], [place] P, DO   3 months ago Class 1 obesity due to excess calories with serious comorbidity and body mass index (BMI) of 33.0 to 33.9 in adult   [place], [place], DO       Future Appointments             In 4 months Wynetta Emery, Barb Merino, DO [employer]

## 2020-09-05 ENCOUNTER — Other Ambulatory Visit: Payer: Self-pay | Admitting: Family Medicine

## 2020-09-21 ENCOUNTER — Ambulatory Visit: Payer: No Typology Code available for payment source | Admitting: Family Medicine

## 2020-09-24 ENCOUNTER — Other Ambulatory Visit: Payer: Self-pay

## 2020-09-24 ENCOUNTER — Encounter: Payer: Self-pay | Admitting: Family Medicine

## 2020-09-24 ENCOUNTER — Ambulatory Visit (INDEPENDENT_AMBULATORY_CARE_PROVIDER_SITE_OTHER): Payer: No Typology Code available for payment source | Admitting: Family Medicine

## 2020-09-24 VITALS — BP 129/85 | HR 85 | Temp 98.9°F | Ht 59.0 in | Wt 160.0 lb

## 2020-09-24 DIAGNOSIS — G8929 Other chronic pain: Secondary | ICD-10-CM

## 2020-09-24 DIAGNOSIS — M9901 Segmental and somatic dysfunction of cervical region: Secondary | ICD-10-CM

## 2020-09-24 DIAGNOSIS — M9900 Segmental and somatic dysfunction of head region: Secondary | ICD-10-CM | POA: Diagnosis not present

## 2020-09-24 DIAGNOSIS — M9908 Segmental and somatic dysfunction of rib cage: Secondary | ICD-10-CM | POA: Diagnosis not present

## 2020-09-24 DIAGNOSIS — M9904 Segmental and somatic dysfunction of sacral region: Secondary | ICD-10-CM

## 2020-09-24 DIAGNOSIS — M9905 Segmental and somatic dysfunction of pelvic region: Secondary | ICD-10-CM

## 2020-09-24 DIAGNOSIS — M545 Low back pain, unspecified: Secondary | ICD-10-CM | POA: Diagnosis not present

## 2020-09-24 DIAGNOSIS — M542 Cervicalgia: Secondary | ICD-10-CM

## 2020-09-24 DIAGNOSIS — M9902 Segmental and somatic dysfunction of thoracic region: Secondary | ICD-10-CM

## 2020-09-24 NOTE — Patient Instructions (Signed)
Low Back Sprain or Strain Rehab Ask your health care provider which exercises are safe for you. Do exercises exactly as told by your health care provider and adjust them as directed. It is normal to feel mild stretching, pulling, tightness, or discomfort as you do these exercises. Stop right away if you feel sudden pain or your pain gets worse. Do not begin these exercises until told by your health care provider. Stretching and range-of-motion exercises These exercises warm up your muscles and joints and improve the movement and flexibility of your back. These exercises also help to relieve pain, numbness, and tingling. Lumbar rotation  1. Lie on your back on a firm surface and bend your knees. 2. Straighten your arms out to your sides so each arm forms a 90-degree angle (right angle) with a side of your body. 3. Slowly move (rotate) both of your knees to one side of your body until you feel a stretch in your lower back (lumbar). Try not to let your shoulders lift off the floor. 4. Hold this position for __________ seconds. 5. Tense your abdominal muscles and slowly move your knees back to the starting position. 6. Repeat this exercise on the other side of your body. Repeat __________ times. Complete this exercise __________ times a day. Single knee to chest  1. Lie on your back on a firm surface with both legs straight. 2. Bend one of your knees. Use your hands to move your knee up toward your chest until you feel a gentle stretch in your lower back and buttock. ? Hold your leg in this position by holding on to the front of your knee. ? Keep your other leg as straight as possible. 3. Hold this position for __________ seconds. 4. Slowly return to the starting position. 5. Repeat with your other leg. Repeat __________ times. Complete this exercise __________ times a day. Prone extension on elbows  1. Lie on your abdomen on a firm surface (prone position). 2. Prop yourself up on your  elbows. 3. Use your arms to help lift your chest up until you feel a gentle stretch in your abdomen and your lower back. ? This will place some of your body weight on your elbows. If this is uncomfortable, try stacking pillows under your chest. ? Your hips should stay down, against the surface that you are lying on. Keep your hip and back muscles relaxed. 4. Hold this position for __________ seconds. 5. Slowly relax your upper body and return to the starting position. Repeat __________ times. Complete this exercise __________ times a day. Strengthening exercises These exercises build strength and endurance in your back. Endurance is the ability to use your muscles for a long time, even after they get tired. Pelvic tilt This exercise strengthens the muscles that lie deep in the abdomen. 1. Lie on your back on a firm surface. Bend your knees and keep your feet flat on the floor. 2. Tense your abdominal muscles. Tip your pelvis up toward the ceiling and flatten your lower back into the floor. ? To help with this exercise, you may place a small towel under your lower back and try to push your back into the towel. 3. Hold this position for __________ seconds. 4. Let your muscles relax completely before you repeat this exercise. Repeat __________ times. Complete this exercise __________ times a day. Alternating arm and leg raises  1. Get on your hands and knees on a firm surface. If you are on a hard floor, you   may want to use padding, such as an exercise mat, to cushion your knees. 2. Line up your arms and legs. Your hands should be directly below your shoulders, and your knees should be directly below your hips. 3. Lift your left leg behind you. At the same time, raise your right arm and straighten it in front of you. ? Do not lift your leg higher than your hip. ? Do not lift your arm higher than your shoulder. ? Keep your abdominal and back muscles tight. ? Keep your hips facing the  ground. ? Do not arch your back. ? Keep your balance carefully, and do not hold your breath. 4. Hold this position for __________ seconds. 5. Slowly return to the starting position. 6. Repeat with your right leg and your left arm. Repeat __________ times. Complete this exercise __________ times a day. Abdominal set with straight leg raise  1. Lie on your back on a firm surface. 2. Bend one of your knees and keep your other leg straight. 3. Tense your abdominal muscles and lift your straight leg up, 4-6 inches (10-15 cm) off the ground. 4. Keep your abdominal muscles tight and hold this position for __________ seconds. ? Do not hold your breath. ? Do not arch your back. Keep it flat against the ground. 5. Keep your abdominal muscles tense as you slowly lower your leg back to the starting position. 6. Repeat with your other leg. Repeat __________ times. Complete this exercise __________ times a day. Single leg lower with bent knees 1. Lie on your back on a firm surface. 2. Tense your abdominal muscles and lift your feet off the floor, one foot at a time, so your knees and hips are bent in 90-degree angles (right angles). ? Your knees should be over your hips and your lower legs should be parallel to the floor. 3. Keeping your abdominal muscles tense and your knee bent, slowly lower one of your legs so your toe touches the ground. 4. Lift your leg back up to return to the starting position. ? Do not hold your breath. ? Do not let your back arch. Keep your back flat against the ground. 5. Repeat with your other leg. Repeat __________ times. Complete this exercise __________ times a day. Posture and body mechanics Good posture and healthy body mechanics can help to relieve stress in your body's tissues and joints. Body mechanics refers to the movements and positions of your body while you do your daily activities. Posture is part of body mechanics. Good posture means:  Your spine is in its  natural S-curve position (neutral).  Your shoulders are pulled back slightly.  Your head is not tipped forward. Follow these guidelines to improve your posture and body mechanics in your everyday activities. Standing   When standing, keep your spine neutral and your feet about hip width apart. Keep a slight bend in your knees. Your ears, shoulders, and hips should line up.  When you do a task in which you stand in one place for a long time, place one foot up on a stable object that is 2-4 inches (5-10 cm) high, such as a footstool. This helps keep your spine neutral. Sitting   When sitting, keep your spine neutral and keep your feet flat on the floor. Use a footrest, if necessary, and keep your thighs parallel to the floor. Avoid rounding your shoulders, and avoid tilting your head forward.  When working at a desk or a computer, keep your desk   at a height where your hands are slightly lower than your elbows. Slide your chair under your desk so you are close enough to maintain good posture.  When working at a computer, place your monitor at a height where you are looking straight ahead and you do not have to tilt your head forward or downward to look at the screen. Resting  When lying down and resting, avoid positions that are most painful for you.  If you have pain with activities such as sitting, bending, stooping, or squatting, lie in a position in which your body does not bend very much. For example, avoid curling up on your side with your arms and knees near your chest (fetal position).  If you have pain with activities such as standing for a long time or reaching with your arms, lie with your spine in a neutral position and bend your knees slightly. Try the following positions: ? Lying on your side with a pillow between your knees. ? Lying on your back with a pillow under your knees. Lifting   When lifting objects, keep your feet at least shoulder width apart and tighten your  abdominal muscles.  Bend your knees and hips and keep your spine neutral. It is important to lift using the strength of your legs, not your back. Do not lock your knees straight out.  Always ask for help to lift heavy or awkward objects. This information is not intended to replace advice given to you by your health care provider. Make sure you discuss any questions you have with your health care provider. Document Revised: 02/28/2019 Document Reviewed: 11/28/2018 Elsevier Patient Education  [employer].  Cervical Strain and Sprain Rehab Ask your health care provider which exercises are safe for you. Do exercises exactly as told by your health care provider and adjust them as directed. It is normal to feel mild stretching, pulling, tightness, or discomfort as you do these exercises. Stop right away if you feel sudden pain or your pain gets worse. Do not begin these exercises until told by your health care provider. Stretching and range-of-motion exercises Cervical side bending  7. Using good posture, sit on a stable chair or stand up. 8. Without moving your shoulders, slowly tilt your left / right ear to your shoulder until you feel a stretch in the opposite side neck muscles. You should be looking straight ahead. 9. Hold for __________ seconds. 10. Repeat with the other side of your neck. Repeat __________ times. Complete this exercise __________ times a day. Cervical rotation  6. Using good posture, sit on a stable chair or stand up. 7. Slowly turn your head to the side as if you are looking over your left / right shoulder. ? Keep your eyes level with the ground. ? Stop when you feel a stretch along the side and the back of your neck. 8. Hold for __________ seconds. 9. Repeat this by turning to your other side. Repeat __________ times. Complete this exercise __________ times a day. Thoracic extension and pectoral stretch 6. Roll a towel or a small blanket so it is about 4 inches  (10 cm) in diameter. 7. Lie down on your back on a firm surface. 8. Put the towel lengthwise, under your spine in the middle of your back. It should not be under your shoulder blades. The towel should line up with your spine from your middle back to your lower back. 9. Put your hands behind your head and let your elbows fall  out to your sides. 10. Hold for __________ seconds. Repeat __________ times. Complete this exercise __________ times a day. Strengthening exercises Isometric upper cervical flexion 5. Lie on your back with a thin pillow behind your head and a small rolled-up towel under your neck. 6. Gently tuck your chin toward your chest and nod your head down to look toward your feet. Do not lift your head off the pillow. 7. Hold for __________ seconds. 8. Release the tension slowly. Relax your neck muscles completely before you repeat this exercise. Repeat __________ times. Complete this exercise __________ times a day. Isometric cervical extension  7. Stand about 6 inches (15 cm) away from a wall, with your back facing the wall. 8. Place a soft object, about 6-8 inches (15-20 cm) in diameter, between the back of your head and the wall. A soft object could be a small pillow, a ball, or a folded towel. 9. Gently tilt your head back and press into the soft object. Keep your jaw and forehead relaxed. 10. Hold for __________ seconds. 11. Release the tension slowly. Relax your neck muscles completely before you repeat this exercise. Repeat __________ times. Complete this exercise __________ times a day. Posture and body mechanics Body mechanics refers to the movements and positions of your body while you do your daily activities. Posture is part of body mechanics. Good posture and healthy body mechanics can help to relieve stress in your body's tissues and joints. Good posture means that your spine is in its natural S-curve position (your spine is neutral), your shoulders are pulled back  slightly, and your head is not tipped forward. The following are general guidelines for applying improved posture and body mechanics to your everyday activities. Sitting  7. When sitting, keep your spine neutral and keep your feet flat on the floor. Use a footrest, if necessary, and keep your thighs parallel to the floor. Avoid rounding your shoulders, and avoid tilting your head forward. 8. When working at a desk or a computer, keep your desk at a height where your hands are slightly lower than your elbows. Slide your chair under your desk so you are close enough to maintain good posture. 9. When working at a computer, place your monitor at a height where you are looking straight ahead and you do not have to tilt your head forward or downward to look at the screen. Standing   When standing, keep your spine neutral and keep your feet about hip-width apart. Keep a slight bend in your knees. Your ears, shoulders, and hips should line up.  When you do a task in which you stand in one place for a long time, place one foot up on a stable object that is 2-4 inches (5-10 cm) high, such as a footstool. This helps keep your spine neutral. Resting When lying down and resting, avoid positions that are most painful for you. Try to support your neck in a neutral position. You can use a contour pillow or a small rolled-up towel. Your pillow should support your neck but not push on it. This information is not intended to replace advice given to you by your health care provider. Make sure you discuss any questions you have with your health care provider. Document Revised: 02/26/2019 Document Reviewed: 08/07/2018 Elsevier Patient Education  [place].

## 2020-09-24 NOTE — Progress Notes (Signed)
BP 129/85   Pulse 85   Temp 98.9 F (37.2 C) (Oral)   Ht _0  (1.499 m)   Wt 160 lb (72.6 kg)   SpO2 99%   BMI 32.32 kg/m    Subjective:    Patient ID: Ashley Wiley, female    DOB: [birth year], 44 y.o.   MRN: [id number]  HPI: Ashley Wiley is a 44 y.o. female  Chief Complaint  Patient presents with  . Back Pain  . Neck Pain   Has been having pain in her neck and her back. Has a lot of pain in her low back that seems to go into her hips and into the upper portion of her legs. It's been going on for about 2 months. At first it seemed to get better with anti-inflammatories. For the past 2 weeks it seems like it's lasting without getting better. No new exercise no falls. No changes. 2 naproxen seemed to make her pain better, heat also helps. Pain is worse with prolonged positions- sitting to standing or standing to moving. Some pain with movement. Pain is more sharp and aching. No radiation.   Has been having neck pain for a few months. Pain is aching and tight in nature. Pain will radiate into her upper neck and into her L trap. Relaxation helps a little bit. Sitting for a long time or using the computer seems to make it worse.   Relevant past medical, surgical, family and social history reviewed and updated as indicated. Interim medical history since our last visit reviewed. Allergies and medications reviewed and updated.  Review of Systems  Constitutional: Negative.   Respiratory: Negative.   Cardiovascular: Negative.   Gastrointestinal: Negative.   Musculoskeletal: Positive for arthralgias, back pain, gait problem, myalgias, neck pain and neck stiffness. Negative for joint swelling.  Skin: Negative.   Psychiatric/Behavioral: Negative.     Per HPI unless specifically indicated above     Objective:    BP 129/85   Pulse 85   Temp 98.9 F (37.2 C) (Oral)   Ht _1  (1.499 m)   Wt 160 lb (72.6 kg)   SpO2 99%   BMI 32.32 kg/m   Wt Readings from Last 3  Encounters:  09/24/20 160 lb (72.6 kg)  07/20/20 166 lb 6.4 oz (75.5 kg)  07/06/20 164 lb 12.8 oz (74.8 kg)    Physical Exam Vitals and nursing note reviewed.  Constitutional:      General: She is not in acute distress.    Appearance: Normal appearance. She is not ill-appearing.  HENT:     Head: Normocephalic and atraumatic.     Right Ear: External ear normal.     Left Ear: External ear normal.     Nose: Nose normal.     Mouth/Throat:     Mouth: Mucous membranes are moist.     Pharynx: Oropharynx is clear.  Eyes:     Extraocular Movements: Extraocular movements intact.     Conjunctiva/sclera: Conjunctivae normal.     Pupils: Pupils are equal, round, and reactive to light.  Neck:     Vascular: No carotid bruit.  Cardiovascular:     Rate and Rhythm: Normal rate.     Pulses: Normal pulses.  Pulmonary:     Effort: Pulmonary effort is normal. No respiratory distress.  Abdominal:     General: Abdomen is flat. There is no distension.     Palpations: Abdomen is soft. There is no mass.  Tenderness: There is no abdominal tenderness. There is no right CVA tenderness, left CVA tenderness, guarding or rebound.     Hernia: No hernia is present.  Musculoskeletal:     Cervical back: No muscular tenderness.  Lymphadenopathy:     Cervical: No cervical adenopathy.  Skin:    General: Skin is warm and dry.     Capillary Refill: Capillary refill takes less than 2 seconds.     Coloration: Skin is not jaundiced or pale.     Findings: No bruising, erythema, lesion or rash.  Neurological:     General: No focal deficit present.     Mental Status: She is alert. Mental status is at baseline.  Psychiatric:        Mood and Affect: Mood normal.        Behavior: Behavior normal.        Thought Content: Thought content normal.        Judgment: Judgment normal.   Musculoskeletal:  Exam found Decreased ROM, Tissue texture changes, Tenderness to palpation and Asymmetry of patient's  head, neck,  thorax, lumbar, pelvis, sacrum and abdomen Osteopathic Structural Exam:   Head: hypertonic suboccipital mucscles, OAESSR  Neck: C3ESRR, C4ESRR  Thorax: T6ESRR, T5ESRL, trap hypertonic on the R with fascial strain into her R side of her neck  Lumbar: QL hypertonic bilaterally, psoas hypertonic bilaterally, L3-4SRLL  Pelvis: Anterior L innominate  Sacrum: L on L torsion  Abdomen: diaphragm hypertonic bilaterally   Results for orders placed or performed in visit on 08/04/20  Lyme Ab/Western Blot Reflex  Result Value Ref Range   Lyme IgG/IgM Ab <0.91 0.00 - 0.90 ISR   LYME DISEASE AB, QUANT, IGM <0.80 0.00 - 0.79 index  Rocky mtn spotted fvr abs pnl(IgG+IgM)  Result Value Ref Range   RMSF IgG Negative Negative   RMSF IgM 0.56 0.00 - 1.44 index  Ehrlichia Antibody Panel  Result Value Ref Range   E.Chaffeensis (HME) IgG Negative Neg:<1:64   E. Chaffeensis ([facility]ME) IgM Titer Negative Neg:<1:20   HGE IgG Titer Negative Neg:<1:64   HGE IgM Titer Negative Neg:<1:20  Babesia microti Antibody Panel  Result Value Ref Range   Babesia microti IgM <1:10 Neg:<1:10   Babesia microti IgG <1:10 Neg:<1:10  Sed Rate (ESR)  Result Value Ref Range   Sed Rate 31 0 - 32 mm/hr  CK (Creatine Kinase)  Result Value Ref Range   Total CK 66 32.0 - 182.0 U/L  Uric acid  Result Value Ref Range   Uric Acid 4.9 2.6 - 6.2 mg/dL  TSH  Result Value Ref Range   TSH 2.280 0.450 - 4.500 uIU/mL  Comprehensive metabolic panel  Result Value Ref Range   Glucose 93 65 - 99 mg/dL   BUN 7 6 - 24 mg/dL   Creatinine, Ser 0.60 0.57 - 1.00 mg/dL   GFR calc non Af Amer 113 >59 mL/min/1.73   GFR calc Af Amer 130 >59 mL/min/1.73   BUN/Creatinine Ratio 12 9 - 23   Sodium 134 134 - 144 mmol/L   Potassium 4.4 3.5 - 5.2 mmol/L   Chloride 97 96 - 106 mmol/L   CO2 22 20 - 29 mmol/L   Calcium 9.3 8.7 - 10.2 mg/dL   Total Protein 6.7 6.0 - 8.5 g/dL   Albumin 4.5 3.8 - 4.8 g/dL   Globulin, Total 2.2 1.5 - 4.5 g/dL    Albumin/Globulin Ratio 2.0 1.2 - 2.2   Bilirubin Total 0.7 0.0 - 1.2 mg/dL  Alkaline Phosphatase 108 44 - 121 IU/L   AST 100 (H) 0 - 40 IU/L   ALT 103 (H) 0 - 32 IU/L  CBC with Differential/Platelet  Result Value Ref Range   WBC 7.9 3.4 - 10.8 x10E3/uL   RBC 4.10 3.77 - 5.28 x10E6/uL   Hemoglobin 12.6 11.1 - 15.9 g/dL   Hematocrit 37.9 34.0 - 46.6 %   MCV 92 79 - 97 fL   MCH 30.7 26.6 - 33.0 pg   MCHC 33.2 31 - 35 g/dL   RDW 12.3 11.7 - 15.4 %   Platelets 220 150 - 450 x10E3/uL   Neutrophils 64 Not Estab. %   Lymphs 27 Not Estab. %   Monocytes 6 Not Estab. %   Eos 2 Not Estab. %   Basos 1 Not Estab. %   Neutrophils Absolute 5.1 1.40 - 7.00 x10E3/uL   Lymphocytes Absolute 2.1 0 - 3 x10E3/uL   Monocytes Absolute 0.4 0 - 0 x10E3/uL   EOS (ABSOLUTE) 0.2 0.0 - 0.4 x10E3/uL   Basophils Absolute 0.0 0 - 0 x10E3/uL   Immature Granulocytes 0 Not Estab. %   Immature Grans (Abs) 0.0 0.0 - 0.1 x10E3/uL  VITAMIN D 25 Hydroxy (Vit-D Deficiency, Fractures)  Result Value Ref Range   Vit D, 25-Hydroxy 52.2 30.0 - 100.0 ng/mL  ANA  Result Value Ref Range   Anti Nuclear Antibody (ANA) Negative Negative      Assessment & Plan:   Problem List Items Addressed This Visit    None    Visit Diagnoses    Chronic bilateral low back pain without sciatica    -  Primary   Seems to be muscular. She does have somatic dysfunction that is contrbuting to her symptoms. Treated today with good results as below. Call with concerns.    Neck pain       Seems to be muscular. She does have somatic dysfunction that is contrbuting to her symptoms. Treated today with good results as below. Call with concerns.    Head region somatic dysfunction       Cervical segment dysfunction       Thoracic segment dysfunction       Somatic dysfunction of sacral region       Somatic dysfunction of pelvis region       Segmental dysfunction of rib cage         After verbal consent was obtained, patient was treated today with  osteopathic manipulative medicine to the regions of the head, neck, thorax, lumbar, pelvis, sacrum and abdomen using the techniques of cranial, myofascial release, counterstrain, muscle energy, HVLA and soft tissue. Areas of compensation relating to her primary pain source also treated. Patient tolerated the procedure well with good objective and good subjective improvement in symptoms. .She  left the room in good condition. She was advised to stay well hydrated and that she may have some soreness following the procedure. If not improving or worsening, she will call and come in. Home exercise program of stretches for traps and lumbar discussed and demonstrated today. Patient will do these stretches BID to before the point of pain, and will return for reevaluation   in 2-3 weeks.   Follow up plan: No follow-ups on file.

## 2020-10-01 ENCOUNTER — Telehealth: Payer: Self-pay

## 2020-10-01 NOTE — Telephone Encounter (Signed)
PA for Saxenda 18 mg/20ml pen injectors renewal started today through cover my meds

## 2020-10-08 ENCOUNTER — Other Ambulatory Visit: Payer: Self-pay

## 2020-10-08 ENCOUNTER — Encounter: Payer: Self-pay | Admitting: Family Medicine

## 2020-10-08 ENCOUNTER — Ambulatory Visit (INDEPENDENT_AMBULATORY_CARE_PROVIDER_SITE_OTHER): Payer: No Typology Code available for payment source | Admitting: Family Medicine

## 2020-10-08 VITALS — BP 119/76 | HR 88 | Temp 98.5°F | Ht 59.0 in | Wt 160.0 lb

## 2020-10-08 DIAGNOSIS — M545 Low back pain, unspecified: Secondary | ICD-10-CM | POA: Diagnosis not present

## 2020-10-08 DIAGNOSIS — M9905 Segmental and somatic dysfunction of pelvic region: Secondary | ICD-10-CM | POA: Diagnosis not present

## 2020-10-08 DIAGNOSIS — M9900 Segmental and somatic dysfunction of head region: Secondary | ICD-10-CM | POA: Diagnosis not present

## 2020-10-08 DIAGNOSIS — M9908 Segmental and somatic dysfunction of rib cage: Secondary | ICD-10-CM

## 2020-10-08 DIAGNOSIS — M9904 Segmental and somatic dysfunction of sacral region: Secondary | ICD-10-CM

## 2020-10-08 DIAGNOSIS — L811 Chloasma: Secondary | ICD-10-CM | POA: Diagnosis not present

## 2020-10-08 DIAGNOSIS — M9901 Segmental and somatic dysfunction of cervical region: Secondary | ICD-10-CM

## 2020-10-08 DIAGNOSIS — G8929 Other chronic pain: Secondary | ICD-10-CM

## 2020-10-08 DIAGNOSIS — M9902 Segmental and somatic dysfunction of thoracic region: Secondary | ICD-10-CM

## 2020-10-08 NOTE — Progress Notes (Signed)
BP 119/76   Pulse 88   Temp 98.5 F (36.9 C) (Oral)   Ht 4' 11" (1.499 m)   Wt 160 lb (72.6 kg)   SpO2 98%   BMI 32.32 kg/m    Subjective:    Patient ID: Ashley Wiley, female    DOB: 08-Sep-1976, 44 y.o.   MRN: [id number]  HPI: Ashley Wiley is a 44 y.o. female  Chief Complaint  Patient presents with  . Back Pain   [name] presents today for evaluation and possible treatment with OMT for her back pain. She notes that she did very well with her last treatment and continues to feel better. She notes that she started hurting agiain about 3-4 days ago. Her pain is not as bad as it was prior to her first visit. Her pain is in bilateral low back. It doesn't radiate. Her pain is aching and sore and tight. Worse with work and activity. Better with rest, medicine and OMT. She is otherwise feeling well with no other concerns or complaints at this time.   Relevant past medical, surgical, family and social history reviewed and updated as indicated. Interim medical history since our last visit reviewed. Allergies and medications reviewed and updated.  Review of Systems  Constitutional: Negative.   Respiratory: Negative.   Cardiovascular: Negative.   Gastrointestinal: Negative.   Musculoskeletal: Positive for back pain and myalgias. Negative for arthralgias, gait problem, joint swelling, neck pain and neck stiffness.  Neurological: Negative.   Psychiatric/Behavioral: Negative.     Per HPI unless specifically indicated above     Objective:    BP 119/76   Pulse 88   Temp 98.5 F (36.9 C) (Oral)   Ht 4' 11" (1.499 m)   Wt 160 lb (72.6 kg)   SpO2 98%   BMI 32.32 kg/m   Wt Readings from Last 3 Encounters:  10/08/20 160 lb (72.6 kg)  09/24/20 160 lb (72.6 kg)  07/20/20 166 lb 6.4 oz (75.5 kg)    Physical Exam Vitals and nursing note reviewed.  Constitutional:      General: She is not in acute distress.    Appearance: Normal appearance. She is not ill-appearing.  HENT:      Head: Normocephalic and atraumatic.     Right Ear: External ear normal.     Left Ear: External ear normal.     Nose: Nose normal.     Mouth/Throat:     Mouth: Mucous membranes are moist.     Pharynx: Oropharynx is clear.  Eyes:     Extraocular Movements: Extraocular movements intact.     Conjunctiva/sclera: Conjunctivae normal.     Pupils: Pupils are equal, round, and reactive to light.  Neck:     Vascular: No carotid bruit.  Cardiovascular:     Rate and Rhythm: Normal rate.     Pulses: Normal pulses.  Pulmonary:     Effort: Pulmonary effort is normal. No respiratory distress.  Abdominal:     General: Abdomen is flat. There is no distension.     Palpations: Abdomen is soft. There is no mass.     Tenderness: There is no abdominal tenderness. There is no right CVA tenderness, left CVA tenderness, guarding or rebound.     Hernia: No hernia is present.  Musculoskeletal:     Cervical back: No muscular tenderness.  Lymphadenopathy:     Cervical: No cervical adenopathy.  Skin:    General: Skin is warm and dry.  Capillary Refill: Capillary refill takes less than 2 seconds.     Coloration: Skin is not jaundiced or pale.     Findings: No bruising, erythema, lesion or rash.  Neurological:     General: No focal deficit present.     Mental Status: She is alert. Mental status is at baseline.  Psychiatric:        Mood and Affect: Mood normal.        Behavior: Behavior normal.        Thought Content: Thought content normal.        Judgment: Judgment normal.   Musculoskeletal:  Exam found Decreased ROM, Tissue texture changes, Tenderness to palpation and Asymmetry of patient's  head, neck, thorax, ribs, lumbar, pelvis, sacrum and abdomen Osteopathic Structural Exam:   Head: hypertonic suboccipital muscles  Neck: SCM hypertonic on the R, [id number]  Thorax: T5-9 SRRL  Ribs: Rib 6 locked up on the R  Lumbar: QL and psoas hypertonic on the R  Pelvis: Anterior R innominate   Sacrum: R on  R torsion with fascial strain into her abdomen and diaphragm  Abdomen: diaphragm hypertonic R>L   Results for orders placed or performed in visit on 08/04/20  Lyme Ab/Western Blot Reflex  Result Value Ref Range   Lyme IgG/IgM Ab <0.91 0.00 - 0.90 ISR   LYME DISEASE AB, QUANT, IGM <0.80 0.00 - 0.79 index  Rocky mtn spotted fvr abs pnl(IgG+IgM)  Result Value Ref Range   RMSF IgG Negative Negative   RMSF IgM 0.56 0.00 - 0.89 index  Ehrlichia Antibody Panel  Result Value Ref Range   E.Chaffeensis (HME) IgG Negative Neg:<1:64   E. Chaffeensis (HME) IgM Titer Negative Neg:<1:20   HGE IgG Titer Negative Neg:<1:64   HGE IgM Titer Negative Neg:<1:20  Babesia microti Antibody Panel  Result Value Ref Range   Babesia microti IgM <1:10 Neg:<1:10   Babesia microti IgG <1:10 Neg:<1:10  Sed Rate (ESR)  Result Value Ref Range   Sed Rate 31 0 - 32 mm/hr  CK (Creatine Kinase)  Result Value Ref Range   Total CK 66 32.0 - 182.0 U/L  Uric acid  Result Value Ref Range   Uric Acid 4.9 2.6 - 6.2 mg/dL  TSH  Result Value Ref Range   TSH 2.280 0.450 - 4.500 uIU/mL  Comprehensive metabolic panel  Result Value Ref Range   Glucose 93 65 - 99 mg/dL   BUN 7 6 - 24 mg/dL   Creatinine, Ser 0.60 0.57 - 1.00 mg/dL   GFR calc non Af Amer 113 >59 mL/min/1.73   GFR calc Af Amer 130 >59 mL/min/1.73   BUN/Creatinine Ratio 12 9 - 23   Sodium 134 134 - 144 mmol/L   Potassium 4.4 3.5 - 5.2 mmol/L   Chloride 97 96 - 106 mmol/L   CO2 22 20 - 29 mmol/L   Calcium 9.3 8.7 - 10.2 mg/dL   Total Protein 6.7 6.0 - 8.5 g/dL   Albumin 4.5 3.8 - 4.8 g/dL   Globulin, Total 2.2 1.5 - 4.5 g/dL   Albumin/Globulin Ratio 2.0 1.2 - 2.2   Bilirubin Total 0.7 0.0 - 1.2 mg/dL   Alkaline Phosphatase 108 44 - 121 IU/L   AST 100 (H) 0 - 40 IU/L   ALT 103 (H) 0 - 32 IU/L  CBC with Differential/Platelet  Result Value Ref Range   WBC 7.9 3.4 - 10.8 x10E3/uL   RBC 4.10 3.77 - 5.28 x10E6/uL   Hemoglobin 12.6 11.1 - 15.9   g/dL    Hematocrit 37.9 34.0 - 46.6 %   MCV 92 79 - 97 fL   MCH 30.7 26.6 - 33.0 pg   MCHC 33.2 31 - 35 g/dL   RDW 12.3 11.7 - 15.4 %   Platelets 220 150 - 450 x10E3/uL   Neutrophils 64 Not Estab. %   Lymphs 27 Not Estab. %   Monocytes 6 Not Estab. %   Eos 2 Not Estab. %   Basos 1 Not Estab. %   Neutrophils Absolute 5.1 1.40 - 7.00 x10E3/uL   Lymphocytes Absolute 2.1 0 - 3 x10E3/uL   Monocytes Absolute 0.4 0 - 0 x10E3/uL   EOS (ABSOLUTE) 0.2 0.0 - 0.4 x10E3/uL   Basophils Absolute 0.0 0 - 0 x10E3/uL   Immature Granulocytes 0 Not Estab. %   Immature Grans (Abs) 0.0 0.0 - 0.1 x10E3/uL  VITAMIN D 25 Hydroxy (Vit-D Deficiency, Fractures)  Result Value Ref Range   Vit D, 25-Hydroxy 52.2 30.0 - 100.0 ng/mL  ANA  Result Value Ref Range   Anti Nuclear Antibody (ANA) Negative Negative      Assessment & Plan:   Problem List Items Addressed This Visit    None    Visit Diagnoses    Chronic bilateral low back pain without sciatica    -  Primary   She does have somatic dysfunction that is contributing to her symptoms. Treated today with good results as below. Call with any concerns.    Melasma       Would like to see derm. Referral generated today.   Relevant Orders   Ambulatory referral to Dermatology   Head region somatic dysfunction       Cervical segment dysfunction       Thoracic segment dysfunction       Somatic dysfunction of sacral region       Somatic dysfunction of pelvis region       Segmental dysfunction of rib cage         After verbal consent was obtained, patient was treated today with osteopathic manipulative medicine to the regions of the head, neck, thorax, ribs, lumbar, pelvis, sacrum and abdomen using the techniques of cranial, myofascial release, counterstrain, muscle energy, HVLA and soft tissue. Areas of compensation relating to her primary pain source also treated. Patient tolerated the procedure well with good objective and good subjective improvement in symptoms. She  left the room in good condition. She was advised to stay well hydrated and that she may have some soreness following the procedure. If not improving or worsening, she will call and come in. She will return for reevaluation   in 2-3 weeks.   Follow up plan: Return in about 2 weeks (around 10/22/2020).      

## 2020-10-10 ENCOUNTER — Encounter: Payer: Self-pay | Admitting: Family Medicine

## 2020-10-11 ENCOUNTER — Other Ambulatory Visit: Payer: Self-pay | Admitting: Family Medicine

## 2020-10-11 ENCOUNTER — Telehealth: Payer: Self-pay

## 2020-10-11 NOTE — Telephone Encounter (Signed)
Copied from [place] [phone]. Topic: Referral - Request for Referral >> Oct 11, 2020  2:25 PM Rayann Heman wrote: Has patient seen PCP for this complaint? Yes Referral for which specialty:Dermatology  Preferred provider/office: [facility]  Reason for referral: Has an appointment on 10/20/20/ hyperpigmentation on face

## 2020-10-11 NOTE — Telephone Encounter (Signed)
Requested Prescriptions  Pending Prescriptions Disp Refills   propranolol ER (INDERAL LA) 80 MG 24 hr capsule [Pharmacy Med Name: PROPRANOLOL HCL ER 80 MG CP 80 Capsule] 90 capsule 0    Sig: TAKE 1 CAPSULE (80 MG TOTAL) BY MOUTH DAILY.     Cardiovascular:  Beta Blockers Passed - 10/11/2020  4:47 PM      Passed - Last BP in normal range    BP Readings from Last 1 Encounters:  10/08/20 119/76         Passed - Last Heart Rate in normal range    Pulse Readings from Last 1 Encounters:  10/08/20 88         Passed - Valid encounter within last 6 months    Recent Outpatient Visits          3 days ago Chronic bilateral low back pain without sciatica   [place], Megan P, DO   2 weeks ago Chronic bilateral low back pain without sciatica   [place], Megan P, DO   1 month ago Need for immunization against influenza   [place], Megan P, DO   2 months ago Arthralgia, unspecified joint   [place], Megan P, DO   2 months ago Ingrown toenail of both feet   [place], [place], DO      Future Appointments            In 1 week Brendolyn Patty, MD [place]   In 2 months [place], Megan P, DO [place], [facility]            atorvastatin (LIPITOR) 40 MG tablet [Pharmacy Med Name: ATORVASTATIN CALCIUM 40 MG 40 Tablet] 90 tablet 0    Sig: TAKE 1 TABLET (40 MG TOTAL) BY MOUTH DAILY.     Cardiovascular:  Antilipid - Statins Failed - 10/11/2020  4:47 PM      Failed - LDL in normal range and within 360 days    LDL Chol Calc (NIH)  Date Value Ref Range Status  07/06/2020 44 0 - 99 mg/dL Final         Failed - HDL in normal range and within 360 days    HDL  Date Value Ref Range Status  07/06/2020 38 (L) >39 mg/dL Final         Passed - Total Cholesterol in normal range and within 360 days    Cholesterol, Total  Date Value Ref Range Status  07/06/2020  100 100 - 199 mg/dL Final         Passed - Triglycerides in normal range and within 360 days    Triglycerides  Date Value Ref Range Status  07/06/2020 91 0 - 149 mg/dL Final         Passed - Patient is not pregnant      Passed - Valid encounter within last 12 months    Recent Outpatient Visits          3 days ago Chronic bilateral low back pain without sciatica   [place], Megan P, DO   2 weeks ago Chronic bilateral low back pain without sciatica   [place], Megan P, DO   1 month ago Need for immunization against influenza   [place], Megan P, DO   2 months ago Arthralgia, unspecified joint   [place], Megan P, DO   2  months ago Ingrown toenail of both feet   [place], [place], DO      Future Appointments            In 1 week Brendolyn Patty, MD [place]   In 2 months [place], Megan P, DO [place], [facility]            nortriptyline (PAMELOR) 50 MG capsule [Pharmacy Med Name: NORTRIPTYLINE HCL 50 MG CAP 50 Capsule] 90 capsule 0    Sig: TAKE 1 CAPSULE (50 MG TOTAL) BY MOUTH AT BEDTIME.     Psychiatry:  Antidepressants - Heterocyclics (TCAs) Passed - 10/11/2020  4:47 PM      Passed - Completed PHQ-2 or PHQ-9 in the last 360 days      Passed - Valid encounter within last 6 months    Recent Outpatient Visits          3 days ago Chronic bilateral low back pain without sciatica   [place], Megan P, DO   2 weeks ago Chronic bilateral low back pain without sciatica   [place], Megan P, DO   1 month ago Need for immunization against influenza   [place], [place], DO   2 months ago Arthralgia, unspecified joint   [place], Megan P, DO   2 months ago Ingrown toenail of both feet   [place], Barb Merino, DO      Future  Appointments            In 1 week Brendolyn Patty, MD [place]   In 2 months Wynetta Emery, Barb Merino, DO [facility]

## 2020-10-11 NOTE — Telephone Encounter (Signed)
REferral was placed last visit.

## 2020-10-11 NOTE — Telephone Encounter (Signed)
Routing to provider  

## 2020-10-12 ENCOUNTER — Telehealth: Payer: Self-pay | Admitting: Family Medicine

## 2020-10-12 ENCOUNTER — Other Ambulatory Visit: Payer: Self-pay | Admitting: Internal Medicine

## 2020-10-12 ENCOUNTER — Ambulatory Visit: Payer: No Typology Code available for payment source | Attending: Internal Medicine

## 2020-10-12 DIAGNOSIS — Z23 Encounter for immunization: Secondary | ICD-10-CM

## 2020-10-12 NOTE — Telephone Encounter (Signed)
Attempted PA. Cover My Meds is stating that a PA is not needed at this time.

## 2020-10-12 NOTE — Telephone Encounter (Signed)
Ref# [id number]  Pt PA for Rx Liraglutide -Weight Management (SAXENDA) 18 MG/3ML SOPN  Will expire this month and they wanted to make Dr. Wynetta Emery aware so she can reprocess another PA/ please advise

## 2020-10-12 NOTE — Telephone Encounter (Signed)
Patient notified. Sent to [facility] yesterday per referral notes.

## 2020-10-12 NOTE — Progress Notes (Signed)
° °  Covid-19 Vaccination Clinic  Name:  CHASTIDY RANKER    MRN: [id number] DOB: [birth year]  10/12/2020  Ms. Neisler was observed post Covid-19 immunization for 15 minutes without incident. She was provided with Vaccine Information Sheet and instruction to access the V-Safe system.   Ms. Hollywood was instructed to call 911 with any severe reactions post vaccine:  Difficulty breathing   Swelling of face and throat   A fast heartbeat   A bad rash all over body   Dizziness and weakness   Immunizations Administered    Name Date Dose VIS Date Route   Pfizer COVID-19 Vaccine 10/12/2020 12:38 PM 0.3 mL 09/08/2020 Intramuscular   Manufacturer: [place]   Lot: [id number]   [place]: [id number]

## 2020-10-18 ENCOUNTER — Telehealth: Payer: Self-pay

## 2020-10-18 NOTE — Telephone Encounter (Signed)
PA for Ashley Wiley has been started today thorugh cover my meds, awaiting on determination Key: [id number]

## 2020-10-18 NOTE — Telephone Encounter (Signed)
PA for Saxenda 18MG /3ML Pen has been approved  For a maximum refill of 12 from 09/2920 to 10/17/2021

## 2020-10-20 ENCOUNTER — Other Ambulatory Visit: Payer: Self-pay

## 2020-10-20 ENCOUNTER — Ambulatory Visit: Payer: No Typology Code available for payment source | Admitting: Dermatology

## 2020-10-20 DIAGNOSIS — L811 Chloasma: Secondary | ICD-10-CM

## 2020-10-20 NOTE — Patient Instructions (Signed)
Instructions for [facility] Medications  One or more of your medications was sent to the [facility] mail order compounding pharmacy. You will receive an email from them and can purchase the medicine through that link. It will then be mailed to your home at the address you confirmed. If for any reason you do not receive an email from them, please check your spam folder. If you still do not find the email, please let us know. [facility] phone number is [phone].  Melasma Discussed chronic condition, worse in summer due to higher UV exposure.  Oral estrogen containing BCPs or supplements can exacerbate condition.  Recommend daily broad spectrum tinted sunscreen SPF 30+ to face, preferably with Zinc or Titanium Dioxide. Discussed Rx topical bleaching creams (i.e. hydroquinone), OTC HelioCare supplement, chemical peels (would need multiple for best result).

## 2020-10-20 NOTE — Progress Notes (Signed)
   Follow-Up Visit   Subjective  Ashley Wiley is a 44 y.o. female who presents for the following: Melasma (x 2 years, cheeks, forehead, upper lip). She used a prescription cream (SM triple cream with tretinoin) we gave her about a year and a half ago. She used cream for 3-4 months without any improvement.  She does not take BCPs.  The following portions of the chart were reviewed this encounter and updated as appropriate:      Review of Systems:  No other skin or systemic complaints except as noted in HPI or Assessment and Plan.  Objective  Well appearing patient in no apparent distress; mood and affect are within normal limits.  A focused examination was performed including face. Relevant physical exam findings are noted in the Assessment and Plan.  Objective  bil cheeks, upper lip, upper eyebrows: Reticulated hyperpigmented patches.   Images         Assessment & Plan  Melasma bil cheeks, upper lip, upper eyebrows  No Improvement Discussed chronic condition, worse in summer due to higher UV exposure.  Oral estrogen containing BCPs or supplements can exacerbate condition.  Recommend daily broad spectrum tinted sunscreen SPF 30+ to face, preferably with Zinc or Titanium Dioxide. Discussed Rx topical bleaching creams (i.e. hydroquinone), OTC HelioCare supplement, chemical peels (would need multiple for best result).  HelioCare info given.  Start Hydroquinone: 12%, Kojic Acid: 6%, Niacinamide: 2%, Vitamin C: 1% 30g 1Rf. Sent to [facility].  Recommend Elta MD Clear Tinted qam Recommend Heliocare in spring/summer Recommend Inkey Tranexamic acid qd during break from [facility]  Return in about 2 months (around 12/21/2020) for melasma.   IJamesetta Orleans, CMA, am acting as scribe for Brendolyn Patty, MD .  Documentation: I have reviewed the above documentation for accuracy and completeness, and I agree with the above.  Brendolyn Patty MD

## 2020-10-22 ENCOUNTER — Encounter: Payer: Self-pay | Admitting: Family Medicine

## 2020-10-22 ENCOUNTER — Ambulatory Visit (INDEPENDENT_AMBULATORY_CARE_PROVIDER_SITE_OTHER): Payer: No Typology Code available for payment source | Admitting: Family Medicine

## 2020-10-22 ENCOUNTER — Other Ambulatory Visit: Payer: Self-pay

## 2020-10-22 VITALS — BP 114/76 | HR 80 | Temp 98.8°F | Wt 159.0 lb

## 2020-10-22 DIAGNOSIS — M9905 Segmental and somatic dysfunction of pelvic region: Secondary | ICD-10-CM | POA: Diagnosis not present

## 2020-10-22 DIAGNOSIS — M9904 Segmental and somatic dysfunction of sacral region: Secondary | ICD-10-CM | POA: Diagnosis not present

## 2020-10-22 DIAGNOSIS — M9902 Segmental and somatic dysfunction of thoracic region: Secondary | ICD-10-CM | POA: Diagnosis not present

## 2020-10-22 DIAGNOSIS — M9908 Segmental and somatic dysfunction of rib cage: Secondary | ICD-10-CM | POA: Diagnosis not present

## 2020-10-22 DIAGNOSIS — M545 Low back pain, unspecified: Secondary | ICD-10-CM | POA: Diagnosis not present

## 2020-10-22 DIAGNOSIS — M9909 Segmental and somatic dysfunction of abdomen and other regions: Secondary | ICD-10-CM

## 2020-10-22 DIAGNOSIS — G8929 Other chronic pain: Secondary | ICD-10-CM

## 2020-10-22 NOTE — Progress Notes (Signed)
BP 114/76   Pulse 80   Temp 98.8 F (37.1 C) (Oral)   Wt 159 lb (72.1 kg)   SpO2 99%   BMI 32.11 kg/m    Subjective:    Patient ID: Ashley Wiley, female    DOB: 07-02-1976, 44 y.o.   MRN: [id number]  HPI: Ashley Wiley is a 44 y.o. female  Chief Complaint  Patient presents with  . Back Pain   Has been feeling a lot better. Back pain still happening occasionally, but not lasting long and going away on it's own. She notes that the pain is in her low back and is pretty chronic. Better with OMT and rest. Worse with work. Pain does not radiate. She is otherwise doing well with no other concerns or complaints at this time.   Relevant past medical, surgical, family and social history reviewed and updated as indicated. Interim medical history since our last visit reviewed. Allergies and medications reviewed and updated.  Review of Systems  Constitutional: Negative.   Respiratory: Negative.   Cardiovascular: Negative.   Gastrointestinal: Negative.   Musculoskeletal: Negative.   Neurological: Negative.   Psychiatric/Behavioral: Negative.     Per HPI unless specifically indicated above     Objective:    BP 114/76   Pulse 80   Temp 98.8 F (37.1 C) (Oral)   Wt 159 lb (72.1 kg)   SpO2 99%   BMI 32.11 kg/m   Wt Readings from Last 3 Encounters:  10/22/20 159 lb (72.1 kg)  10/08/20 160 lb (72.6 kg)  09/24/20 160 lb (72.6 kg)    Physical Exam Vitals and nursing note reviewed.  Constitutional:      General: She is not in acute distress.    Appearance: Normal appearance. She is not ill-appearing.  HENT:     Head: Normocephalic and atraumatic.     Right Ear: External ear normal.     Left Ear: External ear normal.     Nose: Nose normal.     Mouth/Throat:     Mouth: Mucous membranes are moist.     Pharynx: Oropharynx is clear.  Eyes:     Extraocular Movements: Extraocular movements intact.     Conjunctiva/sclera: Conjunctivae normal.     Pupils: Pupils are  equal, round, and reactive to light.  Neck:     Vascular: No carotid bruit.  Cardiovascular:     Rate and Rhythm: Normal rate.     Pulses: Normal pulses.  Pulmonary:     Effort: Pulmonary effort is normal. No respiratory distress.  Abdominal:     General: Abdomen is flat. There is no distension.     Palpations: Abdomen is soft. There is no mass.     Tenderness: There is no abdominal tenderness. There is no right CVA tenderness, left CVA tenderness, guarding or rebound.     Hernia: No hernia is present.  Musculoskeletal:     Cervical back: No muscular tenderness.  Lymphadenopathy:     Cervical: No cervical adenopathy.  Skin:    General: Skin is warm and dry.     Capillary Refill: Capillary refill takes less than 2 seconds.     Coloration: Skin is not jaundiced or pale.     Findings: No bruising, erythema, lesion or rash.  Neurological:     General: No focal deficit present.     Mental Status: She is alert. Mental status is at baseline.  Psychiatric:        Mood and Affect: Mood  normal.        Behavior: Behavior normal.        Thought Content: Thought content normal.        Judgment: Judgment normal.   Musculoskeletal:  Exam found Decreased ROM, Tissue texture changes, Tenderness to palpation and Asymmetry of patient's  ribs, lumbar, pelvis, sacrum and abdomen Osteopathic Structural Exam:   Ribs: Ribs 5-7 locked up on the R, Rib 8 locked up on the L  Lumbar: QL hypertonic bilaterally R>L, psoas hypertonic on the R  Pelvis: Posterior R innominate  Sacrum: R on R torsion  Abdomen: diaphragm hypertonic bilaterally R>L   Results for orders placed or performed in visit on 08/04/20  Lyme Ab/Western Blot Reflex  Result Value Ref Range   Lyme IgG/IgM Ab <0.91 0.00 - 0.90 ISR   LYME DISEASE AB, QUANT, IGM <0.80 0.00 - 0.79 index  Rocky mtn spotted fvr abs pnl(IgG+IgM)  Result Value Ref Range   RMSF IgG Negative Negative   RMSF IgM 0.56 0.00 - 7.25 index  Ehrlichia Antibody Panel   Result Value Ref Range   E.Chaffeensis (HME) IgG Negative Neg:<1:64   E. Chaffeensis (HME) IgM Titer Negative Neg:<1:20   HGE IgG Titer Negative Neg:<1:64   HGE IgM Titer Negative Neg:<1:20  Babesia microti Antibody Panel  Result Value Ref Range   Babesia microti IgM <1:10 Neg:<1:10   Babesia microti IgG <1:10 Neg:<1:10  Sed Rate (ESR)  Result Value Ref Range   Sed Rate 31 0 - 32 mm/hr  CK (Creatine Kinase)  Result Value Ref Range   Total CK 66 32.0 - 182.0 U/L  Uric acid  Result Value Ref Range   Uric Acid 4.9 2.6 - 6.2 mg/dL  TSH  Result Value Ref Range   TSH 2.280 0.450 - 4.500 uIU/mL  Comprehensive metabolic panel  Result Value Ref Range   Glucose 93 65 - 99 mg/dL   BUN 7 6 - 24 mg/dL   Creatinine, Ser 0.60 0.57 - 1.00 mg/dL   GFR calc non Af Amer 113 >59 mL/min/1.73   GFR calc Af Amer 130 >59 mL/min/1.73   BUN/Creatinine Ratio 12 9 - 23   Sodium 134 134 - 144 mmol/L   Potassium 4.4 3.5 - 5.2 mmol/L   Chloride 97 96 - 106 mmol/L   CO2 22 20 - 29 mmol/L   Calcium 9.3 8.7 - 10.2 mg/dL   Total Protein 6.7 6.0 - 8.5 g/dL   Albumin 4.5 3.8 - 4.8 g/dL   Globulin, Total 2.2 1.5 - 4.5 g/dL   Albumin/Globulin Ratio 2.0 1.2 - 2.2   Bilirubin Total 0.7 0.0 - 1.2 mg/dL   Alkaline Phosphatase 108 44 - 121 IU/L   AST 100 (H) 0 - 40 IU/L   ALT 103 (H) 0 - 32 IU/L  CBC with Differential/Platelet  Result Value Ref Range   WBC 7.9 3.4 - 10.8 x10E3/uL   RBC 4.10 3.77 - 5.28 x10E6/uL   Hemoglobin 12.6 11.1 - 15.9 g/dL   Hematocrit 37.9 34.0 - 46.6 %   MCV 92 79 - 97 fL   MCH 30.7 26.6 - 33.0 pg   MCHC 33.2 31 - 35 g/dL   RDW 12.3 11.7 - 15.4 %   Platelets 220 150 - 450 x10E3/uL   Neutrophils 64 Not Estab. %   Lymphs 27 Not Estab. %   Monocytes 6 Not Estab. %   Eos 2 Not Estab. %   Basos 1 Not Estab. %   Neutrophils Absolute  5.1 1.40 - 7.00 x10E3/uL   Lymphocytes Absolute 2.1 0 - 3 x10E3/uL   Monocytes Absolute 0.4 0 - 0 x10E3/uL   EOS (ABSOLUTE) 0.2 0.0 - 0.4 x10E3/uL     Basophils Absolute 0.0 0 - 0 x10E3/uL   Immature Granulocytes 0 Not Estab. %   Immature Grans (Abs) 0.0 0.0 - 0.1 x10E3/uL  VITAMIN D 25 Hydroxy (Vit-D Deficiency, Fractures)  Result Value Ref Range   Vit D, 25-Hydroxy 52.2 30.0 - 100.0 ng/mL  ANA  Result Value Ref Range   Anti Nuclear Antibody (ANA) Negative Negative      Assessment & Plan:   Problem List Items Addressed This Visit    None    Visit Diagnoses    Chronic bilateral low back pain without sciatica    -  Primary   Significantly better. She does have somatic dysfunction that is contributing to her symptoms. Treated today with good results as below. Call with any concerns.    Thoracic segment dysfunction       Somatic dysfunction of sacral region       Somatic dysfunction of pelvis region       Segmental dysfunction of rib cage       Segmental dysfunction of abdomen        After verbal consent was obtained, patient was treated today with osteopathic manipulative medicine to the regions of the ribs, lumbar, pelvis, sacrum and abdomen using the techniques of cranial, myofascial release, counterstrain, muscle energy, HVLA and soft tissue. Areas of compensation relating to her primary pain source also treated. Patient tolerated the procedure well with good objective and good subjective improvement in symptoms. She left the room in good condition. She was advised to stay well hydrated and that she may have some soreness following the procedure. If not improving or worsening, she will call and come in. She will return for reevaluation  on a PRN basis.    Follow up plan: Return if symptoms worsen or fail to improve.

## 2020-10-26 ENCOUNTER — Encounter: Payer: Self-pay | Admitting: Family Medicine

## 2020-11-01 NOTE — Progress Notes (Signed)
° °  SUBJECTIVE:   CHIEF COMPLAINT / HPI: headaches  HEADACHES - known history of menstrual migraines, ACA cerebral aneurysm s/p coil embolization 2019, chronic pansinusitis, T2DM, trigeminal neuralgia - On nortriptyline, propranolol, Zoloft. Previously on aimovig 140mg , discontinued as headaches improved.  Duration: years, getting worse past few months Severity: moderate Quality: sharp, throbbing, pressure Frequency: constant, every day Location: R sided, starts around eye, down to cheek. Pain and numbness to cheek. Sometimes on both sides also.  Headache duration: constant Radiation: yes Time of day headache occurs:  Alleviating factors: 2 naproxen takes edge off Aggravating factors: none Headache status at time of visit: current headache Treatments attempted: Treatments attempted: rest, ibuprofen, triptans and propranolol   Aura: no Nausea:  no Vomiting: no Photophobia:  no Phonophobia:  yes Effect on social functioning:  no Confusion:  no but does have difficulties concentrating Gait disturbance/ataxia:  no Behavioral changes:  no Fevers:  no  Vision changes: no Also with lightheadedness, sinus pain/pressure.  No discharge from nose or postnasal drip or cough. No ear pain or sore throat. Had sinus reaming with ENT in 2017. 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   OBJECTIVE:   BP 117/79    Pulse 96    Temp 98.2 F (36.8 C)    Wt 162 lb 8 oz (73.7 kg)    SpO2 97%    BMI 32.82 kg/m   Gen: well appearing, in NAD HEENT: no tenderness to palpation over maxillary or frontal sinuses.  Cardiac: RRR, no murmur Lungs: CTAB Ext: WWP MSK: Full AROM, strength 5/5 to U/LE bilaterally, deep reflexes intact, normal gait.  No edema.  Neuro: Alert and oriented, speech normal.  Romberg negative.  Optic field normal. Extraocular movements intact.  Intact  sensation to light touch of face but "feels weird on R side, tingling."  Hearing  grossly intact bilaterally.  Tongue protrudes normally with no deviation.  Shoulder shrug, smile symmetric.   ASSESSMENT/PLAN:   Migraine without aura and without status migrainosus, not intractable Chronic with recent worsening.  No symptoms or findings to concern for sinus infection, stroke or aneurysm complication. Previously with good effect on aimovig but reluctant to be on multiple subQ shots. Amenable to trying topamax, Rx sent. F/u in 4 weeks.     Myles Gip, DO

## 2020-11-02 ENCOUNTER — Ambulatory Visit (INDEPENDENT_AMBULATORY_CARE_PROVIDER_SITE_OTHER): Payer: No Typology Code available for payment source | Admitting: Family Medicine

## 2020-11-02 ENCOUNTER — Other Ambulatory Visit: Payer: Self-pay

## 2020-11-02 ENCOUNTER — Other Ambulatory Visit: Payer: Self-pay | Admitting: Family Medicine

## 2020-11-02 ENCOUNTER — Encounter: Payer: Self-pay | Admitting: Family Medicine

## 2020-11-02 DIAGNOSIS — G43009 Migraine without aura, not intractable, without status migrainosus: Secondary | ICD-10-CM | POA: Diagnosis not present

## 2020-11-02 MED ORDER — TOPIRAMATE 25 MG PO TABS: 25.0000 mg | ORAL_TABLET | Freq: Every day | ORAL | 0 refills | Status: DC

## 2020-11-02 NOTE — Assessment & Plan Note (Signed)
Chronic with recent worsening.  No symptoms or findings to concern for sinus infection, stroke or aneurysm complication. Previously with good effect on aimovig but reluctant to be on multiple subQ shots. Amenable to trying topamax, Rx sent. F/u in 4 weeks.

## 2020-11-02 NOTE — Patient Instructions (Signed)
It was great to see you!  Our plans for today:  - We are starting topamax. Let us know if you don't tolerate this.  - Follow up in one month.   Take care and seek immediate care sooner if you develop any concerns.   Dr. Ky Barban

## 2020-11-03 ENCOUNTER — Other Ambulatory Visit: Payer: Self-pay | Admitting: Family Medicine

## 2020-11-11 ENCOUNTER — Other Ambulatory Visit: Payer: Self-pay | Admitting: Family Medicine

## 2020-11-30 ENCOUNTER — Ambulatory Visit: Payer: No Typology Code available for payment source | Admitting: Family Medicine

## 2020-12-15 ENCOUNTER — Ambulatory Visit (INDEPENDENT_AMBULATORY_CARE_PROVIDER_SITE_OTHER): Payer: No Typology Code available for payment source | Admitting: Family Medicine

## 2020-12-15 ENCOUNTER — Other Ambulatory Visit: Payer: Self-pay | Admitting: Family Medicine

## 2020-12-15 ENCOUNTER — Other Ambulatory Visit: Payer: Self-pay | Admitting: Dermatology

## 2020-12-15 ENCOUNTER — Encounter: Payer: Self-pay | Admitting: Dermatology

## 2020-12-15 ENCOUNTER — Other Ambulatory Visit: Payer: Self-pay

## 2020-12-15 ENCOUNTER — Encounter: Payer: Self-pay | Admitting: Family Medicine

## 2020-12-15 ENCOUNTER — Ambulatory Visit: Payer: No Typology Code available for payment source | Admitting: Dermatology

## 2020-12-15 VITALS — BP 124/83 | HR 80 | Temp 98.2°F | Wt 157.0 lb

## 2020-12-15 DIAGNOSIS — E1165 Type 2 diabetes mellitus with hyperglycemia: Secondary | ICD-10-CM | POA: Diagnosis not present

## 2020-12-15 DIAGNOSIS — M9904 Segmental and somatic dysfunction of sacral region: Secondary | ICD-10-CM

## 2020-12-15 DIAGNOSIS — F332 Major depressive disorder, recurrent severe without psychotic features: Secondary | ICD-10-CM | POA: Diagnosis not present

## 2020-12-15 DIAGNOSIS — M545 Low back pain, unspecified: Secondary | ICD-10-CM

## 2020-12-15 DIAGNOSIS — M9903 Segmental and somatic dysfunction of lumbar region: Secondary | ICD-10-CM | POA: Diagnosis not present

## 2020-12-15 DIAGNOSIS — M9908 Segmental and somatic dysfunction of rib cage: Secondary | ICD-10-CM

## 2020-12-15 DIAGNOSIS — M9902 Segmental and somatic dysfunction of thoracic region: Secondary | ICD-10-CM | POA: Diagnosis not present

## 2020-12-15 DIAGNOSIS — M9909 Segmental and somatic dysfunction of abdomen and other regions: Secondary | ICD-10-CM

## 2020-12-15 DIAGNOSIS — G8929 Other chronic pain: Secondary | ICD-10-CM

## 2020-12-15 DIAGNOSIS — Z1231 Encounter for screening mammogram for malignant neoplasm of breast: Secondary | ICD-10-CM

## 2020-12-15 DIAGNOSIS — M9905 Segmental and somatic dysfunction of pelvic region: Secondary | ICD-10-CM

## 2020-12-15 DIAGNOSIS — L811 Chloasma: Secondary | ICD-10-CM

## 2020-12-15 MED ORDER — TRANEXAMIC ACID 650 MG PO TABS: ORAL_TABLET | ORAL | 2 refills | Status: DC

## 2020-12-15 MED ORDER — FINACEA 15 % EX FOAM: CUTANEOUS | 2 refills | Status: DC

## 2020-12-15 NOTE — Patient Instructions (Addendum)
Recommend daily broad spectrum sunscreen SPF 30+ to sun-exposed areas, reapply every 2 hours as needed. Call for new or changing lesions.  Recommend Inkey Tranexamic acid at bedtime, from [facility].  Start [facility] Hydroquinone 8%, Tretinoin 0.1%, Kojic acid 1%, Niacinamide 4%, Fluocinolone 0.025% cream, a pea sized amount nightly to dark spots on face for up to 2 months. This cannot be used more than 3 months due to risk of skin atrophy (thinning) and exogenous ochronosis (permanent dark spots). The patient was advised this is not covered by insurance. They will receive an email to check out and the medication will be mailed to their home.  Instructions for [facility] Medications  One or more of your medications was sent to the [facility] mail order compounding pharmacy. You will receive an email from them and can purchase the medicine through that link. It will then be mailed to your home at the address you confirmed. If for any reason you do not receive an email from them, please check your spam folder. If you still do not find the email, please let us know. [facility] phone number is [phone].   Start Tranexamic acid 650mg  1/2 by mouth twice daily.  Start Finacea foam Twice daily.  Elta MD Clear Tinted facial sunscreen.

## 2020-12-15 NOTE — Progress Notes (Signed)
BP 124/83   Pulse 80   Temp 98.2 F (36.8 C)   Wt 157 lb (71.2 kg)   LMP 11/22/2020   SpO2 100%   BMI 31.71 kg/m    Subjective:    Patient ID: Ashley Wiley, female    DOB: 11-18-1976, 45 y.o.   MRN: [id number]  HPI: Ashley Wiley is a 45 y.o. female  Chief Complaint  Patient presents with  . Diabetes  . Depression  . Back Pain   [name] presents today for evaluation and possible treatment with OMT for her back pain. She notes that she has been feeling better. The OMT really seems like it's been helping. She notes that her low back continues to hurt, but has been hurting less often. She notes that her pain is aching and sore. Pain does not radiate.   DIABETES Hypoglycemic episodes:no Polydipsia/polyuria: no Visual disturbance: no Chest pain: no Paresthesias: no Glucose Monitoring: yes  Accucheck frequency: occasionally Taking Insulin?: no Blood Pressure Monitoring: not checking Retinal Examination: Up to Date Foot Exam: Up to Date Diabetic Education: Completed Pneumovax: Up to Date Influenza: Up to Date Aspirin: no  DEPRESSION Mood status: stable Satisfied with current treatment?: yes Symptom severity: moderate  Duration of current treatment : chronic Side effects: no Medication compliance: excellent compliance Psychotherapy/counseling: yes- in the past  Previous psychiatric medications: sertraline Depressed mood: yes Anxious mood: no Anhedonia: no Significant weight loss or gain: no Insomnia: no  Fatigue: no Feelings of worthlessness or guilt: no Impaired concentration/indecisiveness: no Suicidal ideations: no Hopelessness: no Crying spells: no Depression screen [facility] 2/9 09/24/2020 07/11/2020 03/30/2020 12/31/2019 10/02/2019  Decreased Interest 1 2 0 3 2  Down, Depressed, Hopeless 1 1 0 3 1  PHQ - 2 Score 2 3 0 6 3  Altered sleeping 1 3 0 3 2  Tired, decreased energy 3 3 1 3 3   Change in appetite 1 0 0 3 2  Feeling bad or failure about yourself   1 0 0 3 1  Trouble concentrating 1 0 0 3 0  Moving slowly or fidgety/restless 1 1 0 3 0  Suicidal thoughts 0 0 0 1 0  PHQ-9 Score 10 10 1 25 11   Difficult doing work/chores Not difficult at all Somewhat difficult Not difficult at all Very difficult Somewhat difficult  Some recent data might be hidden    Relevant past medical, surgical, family and social history reviewed and updated as indicated. Interim medical history since our last visit reviewed. Allergies and medications reviewed and updated.  Review of Systems  Constitutional: Negative.   Respiratory: Negative.   Cardiovascular: Negative.   Gastrointestinal: Negative.   Musculoskeletal: Positive for back pain and myalgias. Negative for arthralgias, gait problem, joint swelling, neck pain and neck stiffness.  Skin: Negative.   Psychiatric/Behavioral: Positive for dysphoric mood. Negative for agitation, behavioral problems, confusion, decreased concentration, hallucinations, self-injury, sleep disturbance and suicidal ideas. The patient is nervous/anxious. The patient is not hyperactive.     Per HPI unless specifically indicated above     Objective:    BP 124/83   Pulse 80   Temp 98.2 F (36.8 C)   Wt 157 lb (71.2 kg)   LMP 11/22/2020   SpO2 100%   BMI 31.71 kg/m   Wt Readings from Last 3 Encounters:  12/15/20 157 lb (71.2 kg)  11/02/20 162 lb 8 oz (73.7 kg)  10/22/20 159 lb (72.1 kg)    Physical Exam Vitals and nursing note reviewed.  Constitutional:      General: She is not in acute distress.    Appearance: Normal appearance. She is not ill-appearing.  HENT:     Head: Normocephalic and atraumatic.     Right Ear: External ear normal.     Left Ear: External ear normal.     Nose: Nose normal.     Mouth/Throat:     Mouth: Mucous membranes are moist.     Pharynx: Oropharynx is clear.  Eyes:     Extraocular Movements: Extraocular movements intact.     Conjunctiva/sclera: Conjunctivae normal.     Pupils: Pupils  are equal, round, and reactive to light.  Neck:     Vascular: No carotid bruit.  Cardiovascular:     Rate and Rhythm: Normal rate.     Pulses: Normal pulses.  Pulmonary:     Effort: Pulmonary effort is normal. No respiratory distress.  Abdominal:     General: Abdomen is flat. There is no distension.     Palpations: Abdomen is soft. There is no mass.     Tenderness: There is no abdominal tenderness. There is no right CVA tenderness, left CVA tenderness, guarding or rebound.     Hernia: No hernia is present.  Musculoskeletal:     Cervical back: No muscular tenderness.  Lymphadenopathy:     Cervical: No cervical adenopathy.  Skin:    General: Skin is warm and dry.     Capillary Refill: Capillary refill takes less than 2 seconds.     Coloration: Skin is not jaundiced or pale.     Findings: No bruising, erythema, lesion or rash.  Neurological:     General: No focal deficit present.     Mental Status: She is alert. Mental status is at baseline.  Psychiatric:        Mood and Affect: Mood normal.        Behavior: Behavior normal.        Thought Content: Thought content normal.        Judgment: Judgment normal.   Musculoskeletal:  Exam found Decreased ROM, Tissue texture changes, Tenderness to palpation and Asymmetry of patient's  thorax, ribs, lumbar, pelvis, sacrum and abdomen Osteopathic Structural Exam:   Thorax: T5-7SLRR  Ribs: Ribs 5-9 locked up on the R  Lumbar: QL hypertonic on the R, L4-5SRRL  Pelvis: Posterior R innominate  Sacrum: R on R torsion  Abdomen: diaphragm hypertonic bilaterally  Results for orders placed or performed in visit on 08/04/20  Lyme Ab/Western Blot Reflex  Result Value Ref Range   Lyme IgG/IgM Ab <0.91 0.00 - 0.90 ISR   LYME DISEASE AB, QUANT, IGM <0.80 0.00 - 0.79 index  Rocky mtn spotted fvr abs pnl(IgG+IgM)  Result Value Ref Range   RMSF IgG Negative Negative   RMSF IgM 0.56 0.00 - 4.65 index  Ehrlichia Antibody Panel  Result Value Ref Range    E.Chaffeensis (HME) IgG Negative Neg:<1:64   E. Chaffeensis (HME) IgM Titer Negative Neg:<1:20   HGE IgG Titer Negative Neg:<1:64   HGE IgM Titer Negative Neg:<1:20  Babesia microti Antibody Panel  Result Value Ref Range   Babesia microti IgM <1:10 Neg:<1:10   Babesia microti IgG <1:10 Neg:<1:10  Sed Rate (ESR)  Result Value Ref Range   Sed Rate 31 0 - 32 mm/hr  CK (Creatine Kinase)  Result Value Ref Range   Total CK 66 32 - 182 U/L  Uric acid  Result Value Ref Range   Uric Acid 4.9 2.6 -  6.2 mg/dL  TSH  Result Value Ref Range   TSH 2.280 0.450 - 4.500 uIU/mL  Comprehensive metabolic panel  Result Value Ref Range   Glucose 93 65 - 99 mg/dL   BUN 7 6 - 24 mg/dL   Creatinine, Ser 0.60 0.57 - 1.00 mg/dL   GFR calc non Af Amer 113 >59 mL/min/1.73   GFR calc Af Amer 130 >59 mL/min/1.73   BUN/Creatinine Ratio 12 9 - 23   Sodium 134 134 - 144 mmol/L   Potassium 4.4 3.5 - 5.2 mmol/L   Chloride 97 96 - 106 mmol/L   CO2 22 20 - 29 mmol/L   Calcium 9.3 8.7 - 10.2 mg/dL   Total Protein 6.7 6.0 - 8.5 g/dL   Albumin 4.5 3.8 - 4.8 g/dL   Globulin, Total 2.2 1.5 - 4.5 g/dL   Albumin/Globulin Ratio 2.0 1.2 - 2.2   Bilirubin Total 0.7 0.0 - 1.2 mg/dL   Alkaline Phosphatase 108 44 - 121 IU/L   AST 100 (H) 0 - 40 IU/L   ALT 103 (H) 0 - 32 IU/L  CBC with Differential/Platelet  Result Value Ref Range   WBC 7.9 3.4 - 10.8 x10E3/uL   RBC 4.10 3.77 - 5.28 x10E6/uL   Hemoglobin 12.6 11.1 - 15.9 g/dL   Hematocrit 37.9 34.0 - 46.6 %   MCV 92 79 - 97 fL   MCH 30.7 26.6 - 33.0 pg   MCHC 33.2 31.5 - 35.7 g/dL   RDW 12.3 11.7 - 15.4 %   Platelets 220 150 - 450 x10E3/uL   Neutrophils 64 Not Estab. %   Lymphs 27 Not Estab. %   Monocytes 6 Not Estab. %   Eos 2 Not Estab. %   Basos 1 Not Estab. %   Neutrophils Absolute 5.1 1.4 - 7.0 x10E3/uL   Lymphocytes Absolute 2.1 0.7 - 3.1 x10E3/uL   Monocytes Absolute 0.4 0.1 - 0.9 x10E3/uL   EOS (ABSOLUTE) 0.2 0.0 - 0.4 x10E3/uL   Basophils  Absolute 0.0 0.0 - 0.2 x10E3/uL   Immature Granulocytes 0 Not Estab. %   Immature Grans (Abs) 0.0 0.0 - 0.1 x10E3/uL  VITAMIN D 25 Hydroxy (Vit-D Deficiency, Fractures)  Result Value Ref Range   Vit D, 25-Hydroxy 52.2 30.0 - 100.0 ng/mL  ANA  Result Value Ref Range   Anti Nuclear Antibody (ANA) Negative Negative      Assessment & Plan:   Problem List Items Addressed This Visit      Endocrine   Type 2 diabetes mellitus with hyperglycemia, without long-term current use of insulin (HCC)    Doing well on saxenda, but would like to change to ozempic. Rx sent to her pharmacy. Call with any concerns.       Relevant Medications   Semaglutide, 1 MG/DOSE, (OZEMPIC, 1 MG/DOSE,) 2 MG/1.5ML SOPN     Other   Depression    Under good control on current regimen. Continue current regimen. Continue to monitor. Call with any concerns. Refills given.        Relevant Medications   sertraline (ZOLOFT) 100 MG tablet    Other Visit Diagnoses    Chronic bilateral low back pain without sciatica    -  Primary   Improving with OMT. She does have somatic dysfunction that is contributing to her symptoms. Treated today with good results as below.    Thoracic segment dysfunction       Somatic dysfunction of sacral region       Somatic dysfunction  of pelvis region       Segmental dysfunction of rib cage       Segmental dysfunction of abdomen       Somatic dysfunction of lumbar region         After verbal consent was obtained, patient was treated today with osteopathic manipulative medicine to the regions of the thorax, ribs, lumbar, pelvis, sacrum and abdomen using the techniques of cranial, myofascial release, counterstrain, muscle energy, HVLA and soft tissue. Areas of compensation relating to her primary pain source also treated. Patient tolerated the procedure well with good objective and good subjective improvement in symptoms. She left the room in good condition. She was advised to stay well hydrated  and that she may have some soreness following the procedure. If not improving or worsening, she will call and come in. She will return for reevaluation  on a PRN basis.   Follow up plan: Return in about 4 weeks (around 01/12/2021).

## 2020-12-15 NOTE — Progress Notes (Signed)
   Follow-Up Visit   Subjective  Ashley Wiley is a 45 y.o. female who presents for the following: melasma (6 week recheck. Using Hydroquinone: 12%, Kojic Acid: 6%, Niacinamide: 2%, Vitamin C: 1% as twice daily as directed. Has not noticed much improvement. Tolerating well, no irritation. Wearing sunscreen daily. ). Has used a triple cream with tretinoin in the past. Did not help. Taking Heliocare as directed.  Using Inkey TXA cream at night.  Using Aveeno mineral sunscreen in AM, doesn't reapply.  She is not on any oral BP or estrogen supplements.  The following portions of the chart were reviewed this encounter and updated as appropriate:      Review of Systems: No other skin or systemic complaints except as noted in HPI or Assessment and Plan.  Objective  Well appearing patient in no apparent distress; mood and affect are within normal limits.  A focused examination was performed including head, including the scalp, face, neck, nose, ears, eyelids, and lips. Relevant physical exam findings are noted in the Assessment and Plan.  Objective  B/L cheeks, upper lip, upper eyebrows: Reticulated hyperpigmented patches. Slight lightening when compared to photos  Images        Assessment & Plan     Melasma B/L cheeks, upper lip, upper eyebrows  Chronic condition. Not curable. Resistant to treatment with minimal improvement on therapy  Start Finacea foam BID  Start Tranexamic acid 650mg  1/2 po BID, pt does not have h/o clotting disorder, no DVT or other clotting when on BCP in past.  Inkey Tranexamic acid qd- pt has been using, will stop for now, since transitioning to oral TXA  May consider adding Cyspera cream (Cysteamine) sold at [facility] in [place].  Continue Heliocare 1 tabs PO AM and mid day, increase to 2 tabs during spring/summer.  Start [facility] Hydroquinone 8%, Tretinoin 0.1%, Kojic acid 1%, Niacinamide 4%, Fluocinolone 0.025% cream, a  pea sized amount nightly to dark spots on face for up to 2 months. This cannot be used more than 3 months due to risk of skin atrophy (thinning) and exogenous ochronosis (permanent dark spots). The patient was advised this is not covered by insurance. They will receive an email to check out and the medication will be mailed to their home.    Elta MD Clear Tinted facial sunscreen spf 46 daily.  Melasma is a condition of persistent pigmented patches generally on the face, worse in summer due to higher UV exposure.  Oral estrogen containing BCPs or supplements can exacerbate condition.  Recommend daily broad spectrum tinted sunscreen SPF 30+ to face, preferably with Zinc or Titanium Dioxide. Discussed Rx topical bleaching creams (i.e. hydroquinone), OTC HelioCare supplement, chemical peels (would need multiple for best result).    Azelaic Acid (FINACEA) 15 % FOAM - B/L cheeks, upper lip, upper eyebrows  tranexamic acid (LYSTEDA) 650 MG TABS tablet - B/L cheeks, upper lip, upper eyebrows  Return in about 2 months (around 02/12/2021) for melasma recheck.   I, Emelia Salisbury, CMA, am acting as scribe for Brendolyn Patty, MD.  Documentation: I have reviewed the above documentation for accuracy and completeness, and I agree with the above.  Brendolyn Patty MD

## 2020-12-17 ENCOUNTER — Other Ambulatory Visit: Payer: Self-pay | Admitting: Family Medicine

## 2020-12-17 ENCOUNTER — Ambulatory Visit
Admission: RE | Admit: 2020-12-17 | Discharge: 2020-12-17 | Disposition: A | Discharge status: Home / Self Care | Payer: No Typology Code available for payment source | Source: Ambulatory Visit | Attending: Family Medicine | Admitting: Family Medicine

## 2020-12-17 ENCOUNTER — Other Ambulatory Visit: Payer: Self-pay

## 2020-12-17 DIAGNOSIS — Z1231 Encounter for screening mammogram for malignant neoplasm of breast: Secondary | ICD-10-CM | POA: Diagnosis present

## 2020-12-19 ENCOUNTER — Encounter: Payer: Self-pay | Admitting: Family Medicine

## 2020-12-19 ENCOUNTER — Other Ambulatory Visit: Payer: Self-pay | Admitting: Family Medicine

## 2020-12-19 MED ORDER — SERTRALINE HCL 100 MG PO TABS
150.0000 mg | ORAL_TABLET | ORAL | 1 refills | Status: DC
Start: 2020-12-19 — End: 2020-12-19

## 2020-12-19 MED ORDER — OZEMPIC (1 MG/DOSE) 2 MG/1.5ML ~~LOC~~ SOPN: 1.0000 mg | PEN_INJECTOR | SUBCUTANEOUS | 6 refills | Status: DC

## 2020-12-19 NOTE — Assessment & Plan Note (Signed)
Under good control on current regimen. Continue current regimen. Continue to monitor. Call with any concerns. Refills given.   

## 2020-12-19 NOTE — Assessment & Plan Note (Signed)
Doing well on saxenda, but would like to change to ozempic. Rx sent to her pharmacy. Call with any concerns.

## 2020-12-20 ENCOUNTER — Other Ambulatory Visit: Payer: Self-pay | Admitting: Family Medicine

## 2020-12-20 MED ORDER — ATORVASTATIN CALCIUM 40 MG PO TABS: 40.0000 mg | ORAL_TABLET | Freq: Every day | ORAL | 1 refills | Status: DC

## 2020-12-20 MED ORDER — NORTRIPTYLINE HCL 50 MG PO CAPS: 50.0000 mg | ORAL_CAPSULE | Freq: Every day | ORAL | 1 refills | Status: DC

## 2020-12-20 MED ORDER — TOPIRAMATE 25 MG PO TABS: 25.0000 mg | ORAL_TABLET | Freq: Every day | ORAL | 1 refills | Status: DC

## 2020-12-20 NOTE — Telephone Encounter (Signed)
Medication refill requested 

## 2020-12-20 NOTE — Telephone Encounter (Signed)
Refills requested.

## 2021-01-06 ENCOUNTER — Other Ambulatory Visit: Payer: Self-pay

## 2021-01-06 ENCOUNTER — Ambulatory Visit (INDEPENDENT_AMBULATORY_CARE_PROVIDER_SITE_OTHER): Payer: No Typology Code available for payment source | Admitting: Family Medicine

## 2021-01-06 ENCOUNTER — Other Ambulatory Visit: Payer: Self-pay | Admitting: Family Medicine

## 2021-01-06 ENCOUNTER — Encounter: Payer: Self-pay | Admitting: Family Medicine

## 2021-01-06 VITALS — BP 107/73 | HR 80 | Temp 98.7°F | Wt 154.6 lb

## 2021-01-06 DIAGNOSIS — E559 Vitamin D deficiency, unspecified: Secondary | ICD-10-CM | POA: Diagnosis not present

## 2021-01-06 DIAGNOSIS — E782 Mixed hyperlipidemia: Secondary | ICD-10-CM | POA: Diagnosis not present

## 2021-01-06 DIAGNOSIS — F332 Major depressive disorder, recurrent severe without psychotic features: Secondary | ICD-10-CM

## 2021-01-06 DIAGNOSIS — F419 Anxiety disorder, unspecified: Secondary | ICD-10-CM

## 2021-01-06 DIAGNOSIS — E1165 Type 2 diabetes mellitus with hyperglycemia: Secondary | ICD-10-CM

## 2021-01-06 LAB — BAYER DCA HB A1C WAIVED: HB A1C (BAYER DCA - WAIVED): 5.1 % (ref <7.0)

## 2021-01-06 MED ORDER — METFORMIN HCL ER 500 MG PO TB24: ORAL_TABLET | ORAL | 1 refills | Status: DC

## 2021-01-06 MED ORDER — BUSPIRONE HCL 5 MG PO TABS: 5.0000 mg | ORAL_TABLET | Freq: Three times a day (TID) | ORAL | 1 refills | Status: DC | PRN

## 2021-01-06 MED ORDER — PROPRANOLOL HCL ER 80 MG PO CP24: 80.0000 mg | ORAL_CAPSULE | Freq: Every day | ORAL | 1 refills | Status: DC

## 2021-01-06 NOTE — Patient Instructions (Signed)
[email or web address]

## 2021-01-06 NOTE — Progress Notes (Signed)
BP 107/73   Pulse 80   Temp 98.7 F (37.1 C) (Oral)   Wt 154 lb 9.6 oz (70.1 kg)   SpO2 99%   BMI 31.23 kg/m    Subjective:    Patient ID: Ashley Wiley, female    DOB: 06-01-1976, 45 y.o.   MRN: [id number]  HPI: Ashley Wiley is a 45 y.o. female  Chief Complaint  Patient presents with  . Diabetes  . Hyperlipidemia   DIABETES Hypoglycemic episodes:no Polydipsia/polyuria: no Visual disturbance: no Chest pain: no Paresthesias: no Glucose Monitoring: yes  Accucheck frequency: Daily  Fasting glucose: 83 Taking Insulin?: no Blood Pressure Monitoring: not checking Retinal Examination: Up to Date Foot Exam: Up to Date Diabetic Education: Completed Pneumovax: Up to Date Influenza: Up to Date Aspirin: no  HYPERLIPIDEMIA Hyperlipidemia status: excellent compliance Satisfied with current treatment?  yes Side effects:  no Medication compliance: excellent compliance Past cholesterol meds: atorvastatin Supplements: none Aspirin:  no The ASCVD Risk score Mikey Bussing DC Jr., et al., 2013) failed to calculate for the following reasons:   The valid total cholesterol range is 130 to 320 mg/dL Chest pain:  no Coronary artery disease:  no  ANXIETY/STRESS- depression has been doing really well, but her anxiety is terrible. Having a lot of palpitations Duration:chronic, has been a lot worse in the last 3 months.  Anxious mood: yes  Excessive worrying: yes Irritability: yes  Sweating: no Nausea: no Palpitations:yes Hyperventilation: no Panic attacks: no Agoraphobia: no  Obscessions/compulsions: no Depressed mood: yes Depression screen [facility] 2/9 01/06/2021 09/24/2020 07/11/2020 03/30/2020 12/31/2019  Decreased Interest 0 1 2 0 3  Down, Depressed, Hopeless 1 1 1  0 3  PHQ - 2 Score 1 2 3  0 6  Altered sleeping 0 1 3 0 3  Tired, decreased energy 0 3 3 1 3   Change in appetite 0 1 0 0 3  Feeling bad or failure about yourself  1 1 0 0 3  Trouble concentrating 1 1 0 0 3  Moving  slowly or fidgety/restless 0 1 1 0 3  Suicidal thoughts 0 0 0 0 1  PHQ-9 Score 3 10 10 1 25   Difficult doing work/chores Not difficult at all Not difficult at all Somewhat difficult Not difficult at all Very difficult  Some recent data might be hidden   Anhedonia: no Weight changes: no Insomnia: no   Hypersomnia: no Fatigue/loss of energy: yes Feelings of worthlessness: no Feelings of guilt: no Impaired concentration/indecisiveness: no Suicidal ideations: no  Crying spells: no Recent Stressors/Life Changes: no   Relationship problems: no   Family stress: no     Financial stress: no    Job stress: no    Recent death/loss: no   Relevant past medical, surgical, family and social history reviewed and updated as indicated. Interim medical history since our last visit reviewed. Allergies and medications reviewed and updated.  Review of Systems  Constitutional: Negative.   Respiratory: Negative.   Cardiovascular: Negative.   Gastrointestinal: Negative.   Musculoskeletal: Negative.   Skin: Negative.   Neurological: Negative.   Psychiatric/Behavioral: Negative for agitation, behavioral problems, confusion, decreased concentration, dysphoric mood, hallucinations, self-injury, sleep disturbance and suicidal ideas. The patient is nervous/anxious. The patient is not hyperactive.     Per HPI unless specifically indicated above     Objective:    BP 107/73   Pulse 80   Temp 98.7 F (37.1 C) (Oral)   Wt 154 lb 9.6 oz (70.1 kg)   SpO2  99%   BMI 31.23 kg/m   Wt Readings from Last 3 Encounters:  01/06/21 154 lb 9.6 oz (70.1 kg)  12/15/20 157 lb (71.2 kg)  11/02/20 162 lb 8 oz (73.7 kg)    Physical Exam Vitals and nursing note reviewed.  Constitutional:      General: She is not in acute distress.    Appearance: Normal appearance. She is not ill-appearing, toxic-appearing or diaphoretic.  HENT:     Head: Normocephalic and atraumatic.     Right Ear: External ear normal.      Left Ear: External ear normal.     Nose: Nose normal.     Mouth/Throat:     Mouth: Mucous membranes are moist.     Pharynx: Oropharynx is clear.  Eyes:     General: No scleral icterus.       Right eye: No discharge.        Left eye: No discharge.     Extraocular Movements: Extraocular movements intact.     Conjunctiva/sclera: Conjunctivae normal.     Pupils: Pupils are equal, round, and reactive to light.  Cardiovascular:     Rate and Rhythm: Normal rate and regular rhythm.     Pulses: Normal pulses.     Heart sounds: Normal heart sounds. No murmur heard. No friction rub. No gallop.   Pulmonary:     Effort: Pulmonary effort is normal. No respiratory distress.     Breath sounds: Normal breath sounds. No stridor. No wheezing, rhonchi or rales.  Chest:     Chest wall: No tenderness.  Musculoskeletal:        General: Normal range of motion.     Cervical back: Normal range of motion and neck supple.  Skin:    General: Skin is warm and dry.     Capillary Refill: Capillary refill takes less than 2 seconds.     Coloration: Skin is not jaundiced or pale.     Findings: No bruising, erythema, lesion or rash.  Neurological:     General: No focal deficit present.     Mental Status: She is alert and oriented to person, place, and time. Mental status is at baseline.  Psychiatric:        Mood and Affect: Mood normal.        Behavior: Behavior normal.        Thought Content: Thought content normal.        Judgment: Judgment normal.     Results for orders placed or performed in visit on 01/06/21  Bayer DCA Hb A1c Waived  Result Value Ref Range   HB A1C (BAYER DCA - WAIVED) 5.1 <7.0 %  CBC with Differential/Platelet  Result Value Ref Range   WBC 7.5 3.4 - 10.8 x10E3/uL   RBC 4.16 3.77 - 5.28 x10E6/uL   Hemoglobin 12.8 11.1 - 15.9 g/dL   Hematocrit 39.8 34.0 - 46.6 %   MCV 96 79 - 97 fL   MCH 30.8 26.6 - 33.0 pg   MCHC 32.2 31.5 - 35.7 g/dL   RDW 12.4 11.7 - 15.4 %   Platelets 187  150 - 450 x10E3/uL   Neutrophils 64 Not Estab. %   Lymphs 28 Not Estab. %   Monocytes 6 Not Estab. %   Eos 2 Not Estab. %   Basos 0 Not Estab. %   Neutrophils Absolute 4.8 1.4 - 7.0 x10E3/uL   Lymphocytes Absolute 2.1 0.7 - 3.1 x10E3/uL   Monocytes Absolute 0.4 0.1 - 0.9  x10E3/uL   EOS (ABSOLUTE) 0.1 0.0 - 0.4 x10E3/uL   Basophils Absolute 0.0 0.0 - 0.2 x10E3/uL   Immature Granulocytes 0 Not Estab. %   Immature Grans (Abs) 0.0 0.0 - 0.1 x10E3/uL  Comprehensive metabolic panel  Result Value Ref Range   Glucose 79 65 - 99 mg/dL   BUN 6 6 - 24 mg/dL   Creatinine, Ser 0.58 0.57 - 1.00 mg/dL   GFR calc non Af Amer 113 >59 mL/min/1.73   GFR calc Af Amer 131 >59 mL/min/1.73   BUN/Creatinine Ratio 10 9 - 23   Sodium 139 134 - 144 mmol/L   Potassium 3.9 3.5 - 5.2 mmol/L   Chloride 101 96 - 106 mmol/L   CO2 23 20 - 29 mmol/L   Calcium 9.2 8.7 - 10.2 mg/dL   Total Protein 6.7 6.0 - 8.5 g/dL   Albumin 4.4 3.8 - 4.8 g/dL   Globulin, Total 2.3 1.5 - 4.5 g/dL   Albumin/Globulin Ratio 1.9 1.2 - 2.2   Bilirubin Total 0.5 0.0 - 1.2 mg/dL   Alkaline Phosphatase 100 44 - 121 IU/L   AST 34 0 - 40 IU/L   ALT 37 (H) 0 - 32 IU/L  Lipid Panel w/o Chol/HDL Ratio  Result Value Ref Range   Cholesterol, Total 99 (L) 100 - 199 mg/dL   Triglycerides 85 0 - 149 mg/dL   HDL 47 >39 mg/dL   VLDL Cholesterol Cal 17 5 - 40 mg/dL   LDL Chol Calc (NIH) 35 0 - 99 mg/dL  VITAMIN D 25 Hydroxy (Vit-D Deficiency, Fractures)  Result Value Ref Range   Vit D, 25-Hydroxy 38.9 30.0 - 100.0 ng/mL      Assessment & Plan:   Problem List Items Addressed This Visit      Endocrine   Type 2 diabetes mellitus with hyperglycemia, without long-term current use of insulin ([place]) - Primary    Doing great! Discussed coming off metformin. Would like to stay where she is for now. Will recheck next time.       Relevant Medications   metFORMIN (GLUCOPHAGE-XR) 500 MG 24 hr tablet   Other Relevant Orders   Bayer DCA Hb A1c  Waived (Completed)   CBC with Differential/Platelet (Completed)   Comprehensive metabolic panel (Completed)     Other   Hyperlipidemia    Under good control on current regimen. Continue current regimen. Continue to monitor. Call with any concerns. Refills given. Labs drawn today.        Relevant Medications   propranolol ER (INDERAL LA) 80 MG 24 hr capsule   Other Relevant Orders   CBC with Differential/Platelet (Completed)   Comprehensive metabolic panel (Completed)   Lipid Panel w/o Chol/HDL Ratio (Completed)   Vitamin D deficiency disease    Under good control on current regimen. Continue current regimen. Continue to monitor. Call with any concerns. Refills given. Labs drawn today.       Relevant Orders   CBC with Differential/Platelet (Completed)   Comprehensive metabolic panel (Completed)   VITAMIN D 25 Hydroxy (Vit-D Deficiency, Fractures) (Completed)   Depression    Under good control on current regimen. Continue current regimen. Continue to monitor. Call with any concerns. Refills given.       Relevant Medications   busPIRone (BUSPAR) 5 MG tablet   Other Relevant Orders   CBC with Differential/Platelet (Completed)   Comprehensive metabolic panel (Completed)   Acute anxiety    In exacerbation. Will start buspar and increase her sertraline to  150mg . Call with any concerns. Continue to monitor.       Relevant Medications   busPIRone (BUSPAR) 5 MG tablet       Follow up plan: Return in about 6 months (around 07/06/2021), or Physical.

## 2021-01-07 LAB — COMPREHENSIVE METABOLIC PANEL
ALT: 37 IU/L — ABNORMAL HIGH (ref 0–32)
AST: 34 IU/L (ref 0–40)
Albumin/Globulin Ratio: 1.9 (ref 1.2–2.2)
Albumin: 4.4 g/dL (ref 3.8–4.8)
Alkaline Phosphatase: 100 IU/L (ref 44–121)
BUN/Creatinine Ratio: 10 (ref 9–23)
BUN: 6 mg/dL (ref 6–24)
Bilirubin Total: 0.5 mg/dL (ref 0.0–1.2)
CO2: 23 mmol/L (ref 20–29)
Calcium: 9.2 mg/dL (ref 8.7–10.2)
Chloride: 101 mmol/L (ref 96–106)
Creatinine, Ser: 0.58 mg/dL (ref 0.57–1.00)
GFR calc Af Amer: 131 mL/min/{1.73_m2} (ref >59)
GFR calc non Af Amer: 113 mL/min/{1.73_m2} (ref >59)
Globulin, Total: 2.3 g/dL (ref 1.5–4.5)
Glucose: 79 mg/dL (ref 65–99)
Potassium: 3.9 mmol/L (ref 3.5–5.2)
Sodium: 139 mmol/L (ref 134–144)
Total Protein: 6.7 g/dL (ref 6.0–8.5)

## 2021-01-07 LAB — LIPID PANEL W/O CHOL/HDL RATIO
Cholesterol, Total: 99 mg/dL — ABNORMAL LOW (ref 100–199)
HDL: 47 mg/dL (ref >39)
LDL Chol Calc (NIH): 35 mg/dL (ref 0–99)
Triglycerides: 85 mg/dL (ref 0–149)
VLDL Cholesterol Cal: 17 mg/dL (ref 5–40)

## 2021-01-07 LAB — CBC WITH DIFFERENTIAL/PLATELET
Basophils Absolute: 0 10*3/uL (ref 0.0–0.2)
Basos: 0 %
EOS (ABSOLUTE): 0.1 10*3/uL (ref 0.0–0.4)
Eos: 2 %
Hematocrit: 39.8 % (ref 34.0–46.6)
Hemoglobin: 12.8 g/dL (ref 11.1–15.9)
Immature Grans (Abs): 0 10*3/uL (ref 0.0–0.1)
Immature Granulocytes: 0 %
Lymphocytes Absolute: 2.1 10*3/uL (ref 0.7–3.1)
Lymphs: 28 %
MCH: 30.8 pg (ref 26.6–33.0)
MCHC: 32.2 g/dL (ref 31.5–35.7)
MCV: 96 fL (ref 79–97)
Monocytes Absolute: 0.4 10*3/uL (ref 0.1–0.9)
Monocytes: 6 %
Neutrophils Absolute: 4.8 10*3/uL (ref 1.4–7.0)
Neutrophils: 64 %
Platelets: 187 10*3/uL (ref 150–450)
RBC: 4.16 x10E6/uL (ref 3.77–5.28)
RDW: 12.4 % (ref 11.7–15.4)
WBC: 7.5 10*3/uL (ref 3.4–10.8)

## 2021-01-07 LAB — VITAMIN D 25 HYDROXY (VIT D DEFICIENCY, FRACTURES): Vit D, 25-Hydroxy: 38.9 ng/mL (ref 30.0–100.0)

## 2021-01-09 ENCOUNTER — Encounter: Payer: Self-pay | Admitting: Family Medicine

## 2021-01-09 NOTE — Assessment & Plan Note (Signed)
Doing great! Discussed coming off metformin. Would like to stay where she is for now. Will recheck next time.

## 2021-01-09 NOTE — Assessment & Plan Note (Signed)
Under good control on current regimen. Continue current regimen. Continue to monitor. Call with any concerns. Refills given. Labs drawn today.   

## 2021-01-09 NOTE — Assessment & Plan Note (Addendum)
In exacerbation. Will start buspar and increase her sertraline to 150mg . Call with any concerns. Continue to monitor.

## 2021-01-09 NOTE — Assessment & Plan Note (Signed)
Under good control on current regimen. Continue current regimen. Continue to monitor. Call with any concerns. Refills given.   

## 2021-02-02 ENCOUNTER — Other Ambulatory Visit: Payer: Self-pay | Admitting: Nurse Practitioner

## 2021-02-02 ENCOUNTER — Ambulatory Visit (INDEPENDENT_AMBULATORY_CARE_PROVIDER_SITE_OTHER): Payer: No Typology Code available for payment source | Admitting: Nurse Practitioner

## 2021-02-02 ENCOUNTER — Encounter: Payer: Self-pay | Admitting: Nurse Practitioner

## 2021-02-02 ENCOUNTER — Other Ambulatory Visit: Payer: Self-pay

## 2021-02-02 VITALS — BP 112/74 | HR 82 | Temp 98.2°F | Wt 150.0 lb

## 2021-02-02 DIAGNOSIS — G43811 Other migraine, intractable, with status migrainosus: Secondary | ICD-10-CM

## 2021-02-02 MED ORDER — KETOROLAC TROMETHAMINE 60 MG/2ML IM SOLN
60.0000 mg | Freq: Once | INTRAMUSCULAR | Status: AC
  Administered 2021-02-02: 30 mg via INTRAMUSCULAR

## 2021-02-02 MED ORDER — KETOROLAC TROMETHAMINE 30 MG/ML IJ SOLN: 30.0000 mg | Freq: Once | INTRAMUSCULAR | Status: DC

## 2021-02-02 MED ORDER — TOPIRAMATE 50 MG PO TABS: 50.0000 mg | ORAL_TABLET | Freq: Every day | ORAL | 1 refills | Status: DC

## 2021-02-02 NOTE — Progress Notes (Signed)
BP 112/74   Pulse 82   Temp 98.2 F (36.8 C) (Oral)   Wt 150 lb (68 kg)   BMI 30.30 kg/m    Subjective:    Patient ID: Ashley Wiley, female    DOB: [birth year], 45 y.o.   MRN: [id number]  HPI: Ashley Wiley is a 45 y.o. female  Chief Complaint  Patient presents with  . Headache    Pt states she has had a headache for the last few days. States she has pressure behind her eyes as well.    HEADACHE Patient started on Topamax which significantly helped with symptoms.  Duration: days Onset: gradual Severity: 6/10 Quality: dull and aching Frequency: constant Location:  Above eyes. Right side of head Headache duration: 3 days Radiation: yes Time of day headache occurs:  Alleviating factors:  Aggravating factors:  Headache status at time of visit: current headache Treatments attempted: Treatments attempted: naproxyn   Aura: no Nausea:  no Vomiting: no Photophobia:  yes Phonophobia:  no Effect on social functioning:  yes Numbers of missed days of school/work each month:  Confusion:  no Gait disturbance/ataxia:  no Behavioral changes:  no Fevers:  no  Relevant past medical, surgical, family and social history reviewed and updated as indicated. Interim medical history since our last visit reviewed. Allergies and medications reviewed and updated.  Review of Systems  Constitutional: Negative for fever.  HENT: Positive for sinus pressure and sinus pain.   Eyes: Positive for photophobia.  Gastrointestinal: Negative for nausea and vomiting.  Neurological: Positive for headaches.  Psychiatric/Behavioral: Negative for behavioral problems and confusion.    Per HPI unless specifically indicated above     Objective:    BP 112/74   Pulse 82   Temp 98.2 F (36.8 C) (Oral)   Wt 150 lb (68 kg)   BMI 30.30 kg/m   Wt Readings from Last 3 Encounters:  02/02/21 150 lb (68 kg)  01/06/21 154 lb 9.6 oz (70.1 kg)  12/15/20 157 lb (71.2 kg)    Physical Exam Vitals  and nursing note reviewed.  Constitutional:      General: She is not in acute distress.    Appearance: Normal appearance. She is normal weight. She is not ill-appearing, toxic-appearing or diaphoretic.  HENT:     Head: Normocephalic.     Right Ear: External ear normal.     Left Ear: External ear normal.     Nose: Nose normal.     Mouth/Throat:     Mouth: Mucous membranes are moist.     Pharynx: Oropharynx is clear.  Eyes:     General: No visual field deficit.       Right eye: No discharge.        Left eye: No discharge.     Extraocular Movements: Extraocular movements intact.     Conjunctiva/sclera: Conjunctivae normal.     Pupils: Pupils are equal, round, and reactive to light.  Cardiovascular:     Rate and Rhythm: Normal rate and regular rhythm.     Heart sounds: No murmur heard.   Pulmonary:     Effort: Pulmonary effort is normal. No respiratory distress.     Breath sounds: Normal breath sounds. No wheezing or rales.  Musculoskeletal:     Cervical back: Normal range of motion and neck supple.  Skin:    General: Skin is warm and dry.     Capillary Refill: Capillary refill takes less than 2 seconds.  Neurological:  General: No focal deficit present.     Mental Status: She is alert and oriented to person, place, and time. Mental status is at baseline.     Cranial Nerves: No cranial nerve deficit or facial asymmetry.     Sensory: No sensory deficit.     Motor: No weakness.     Gait: Gait normal.  Psychiatric:        Mood and Affect: Mood normal.        Behavior: Behavior normal.        Thought Content: Thought content normal.        Judgment: Judgment normal.     Results for orders placed or performed in visit on 01/06/21  Bayer DCA Hb A1c Waived  Result Value Ref Range   HB A1C (BAYER DCA - WAIVED) 5.1 <7.0 %  CBC with Differential/Platelet  Result Value Ref Range   WBC 7.5 3.4 - 10.8 x10E3/uL   RBC 4.16 3.77 - 5.28 x10E6/uL   Hemoglobin 12.8 11.1 - 15.9 g/dL    Hematocrit 39.8 34.0 - 46.6 %   MCV 96 79 - 97 fL   MCH 30.8 26.6 - 33.0 pg   MCHC 32.2 31.5 - 35.7 g/dL   RDW 12.4 11.7 - 15.4 %   Platelets 187 150 - 450 x10E3/uL   Neutrophils 64 Not Estab. %   Lymphs 28 Not Estab. %   Monocytes 6 Not Estab. %   Eos 2 Not Estab. %   Basos 0 Not Estab. %   Neutrophils Absolute 4.8 1.4 - 7.0 x10E3/uL   Lymphocytes Absolute 2.1 0.7 - 3.1 x10E3/uL   Monocytes Absolute 0.4 0.1 - 0.9 x10E3/uL   EOS (ABSOLUTE) 0.1 0.0 - 0.4 x10E3/uL   Basophils Absolute 0.0 0.0 - 0.2 x10E3/uL   Immature Granulocytes 0 Not Estab. %   Immature Grans (Abs) 0.0 0.0 - 0.1 x10E3/uL  Comprehensive metabolic panel  Result Value Ref Range   Glucose 79 65 - 99 mg/dL   BUN 6 6 - 24 mg/dL   Creatinine, Ser 0.58 0.57 - 1.00 mg/dL   GFR calc non Af Amer 113 >59 mL/min/1.73   GFR calc Af Amer 131 >59 mL/min/1.73   BUN/Creatinine Ratio 10 9 - 23   Sodium 139 134 - 144 mmol/L   Potassium 3.9 3.5 - 5.2 mmol/L   Chloride 101 96 - 106 mmol/L   CO2 23 20 - 29 mmol/L   Calcium 9.2 8.7 - 10.2 mg/dL   Total Protein 6.7 6.0 - 8.5 g/dL   Albumin 4.4 3.8 - 4.8 g/dL   Globulin, Total 2.3 1.5 - 4.5 g/dL   Albumin/Globulin Ratio 1.9 1.2 - 2.2   Bilirubin Total 0.5 0.0 - 1.2 mg/dL   Alkaline Phosphatase 100 44 - 121 IU/L   AST 34 0 - 40 IU/L   ALT 37 (H) 0 - 32 IU/L  Lipid Panel w/o Chol/HDL Ratio  Result Value Ref Range   Cholesterol, Total 99 (L) 100 - 199 mg/dL   Triglycerides 85 0 - 149 mg/dL   HDL 47 >39 mg/dL   VLDL Cholesterol Cal 17 5 - 40 mg/dL   LDL Chol Calc (NIH) 35 0 - 99 mg/dL  VITAMIN D 25 Hydroxy (Vit-D Deficiency, Fractures)  Result Value Ref Range   Vit D, 25-Hydroxy 38.9 30.0 - 100.0 ng/mL      Assessment & Plan:   Problem List Items Addressed This Visit   None   Visit Diagnoses    Other migraine  with status migrainosus, intractable    -  Primary   Toradol given in office today. Increase topamax to 50mg  daily. Recommend restarting Claritin and flonase due  to sinus sensitivity. RTC if symptoms worsen.   Relevant Medications   topiramate (TOPAMAX) 50 MG tablet   ketorolac (TORADOL) 30 MG/ML injection 30 mg (Start on 02/02/2021  1:45 PM)       Follow up plan: Return if symptoms worsen or fail to improve.

## 2021-02-07 ENCOUNTER — Telehealth: Payer: Self-pay | Admitting: Nurse Practitioner

## 2021-02-07 ENCOUNTER — Other Ambulatory Visit: Payer: Self-pay | Admitting: Nurse Practitioner

## 2021-02-07 DIAGNOSIS — G43811 Other migraine, intractable, with status migrainosus: Secondary | ICD-10-CM

## 2021-02-07 MED ORDER — BUTALBITAL-APAP-CAFFEINE 50-325-40 MG PO TABS: 1.0000 | ORAL_TABLET | Freq: Four times a day (QID) | ORAL | 0 refills | Status: DC | PRN

## 2021-02-07 NOTE — Telephone Encounter (Signed)
Please let patient know that I have sent Fioricet to the pharmacy for her.  If her symptoms have not improved by Wednesday she should let us know.  We will likely order imaging due to patient's history.

## 2021-02-07 NOTE — Telephone Encounter (Signed)
error 

## 2021-02-07 NOTE — Telephone Encounter (Signed)
Patient notified

## 2021-02-08 ENCOUNTER — Other Ambulatory Visit: Payer: Self-pay | Admitting: Dermatology

## 2021-02-08 ENCOUNTER — Ambulatory Visit (INDEPENDENT_AMBULATORY_CARE_PROVIDER_SITE_OTHER): Payer: No Typology Code available for payment source | Admitting: Dermatology

## 2021-02-08 ENCOUNTER — Other Ambulatory Visit: Payer: Self-pay

## 2021-02-08 DIAGNOSIS — L811 Chloasma: Secondary | ICD-10-CM | POA: Diagnosis not present

## 2021-02-08 MED ORDER — TRANEXAMIC ACID 650 MG PO TABS: ORAL_TABLET | ORAL | 3 refills | Status: DC

## 2021-02-08 NOTE — Patient Instructions (Addendum)
Recommend taking Heliocare sun protection supplement daily in sunny weather for additional sun protection. For maximum protection on the sunniest days, you can take up to 2 capsules of regular Heliocare OR take 1 capsule of Heliocare Ultra. For prolonged exposure (such as a full day in the sun), you can repeat your dose of the supplement 4 hours after your first dose. Heliocare can be purchased at [facility] or at [email or web address].   Recommend daily broad spectrum sunscreen SPF 30+ to sun-exposed areas, reapply every 2 hours as needed. Call for new or changing lesions.  Staying in the shade or wearing long sleeves, sun glasses (UVA+UVB protection) and wide brim hats (4-inch brim around the entire circumference of the hat) are also recommended for sun protection.

## 2021-02-08 NOTE — Progress Notes (Signed)
   Follow-Up Visit   Subjective  Ashley Wiley is a 45 y.o. female who presents for the following: melasma  (Patient is here today for 2 month follow up on melasma. She is currently using finacea 15 % foam twice daily . Patient reports areas have cleared up a lot. ).  She is also taking TXA 650 mg 1/2 pill bid and Heliocare 1 tab PO am and midday.  She tried the SM fade cream with tretinoin 0.1% but had a lot of dryness, so she stopped it.  She hasn't had any side effects from oral TXA.  The following portions of the chart were reviewed this encounter and updated as appropriate:      Objective  Well appearing patient in no apparent distress; mood and affect are within normal limits.  A focused examination was performed including B/L cheeks, upper lip, upper eyebrows. Relevant physical exam findings are noted in the Assessment and Plan.  Objective  B/L cheeks, upper lip, upper eyebrows: Hyperpigmented patches with lightening on bilateral cheeks when compared to baseline photos  Reticulated hyperpigmentation on bilateral zygoma   Hyperpigmented patches on upper lip and eyebrows  Images        Assessment & Plan  Melasma B/L cheeks, upper lip, upper eyebrows  Chronic condition. Not curable. Some mild improvement but not at goal  Melasma is a condition of persistent pigmented patches generally on the face, worse in summer due to higher UV exposure.  Oral estrogen containing BCPs or supplements can exacerbate condition.  Recommend daily broad spectrum tinted sunscreen SPF 30+ to face, preferably with Zinc or Titanium Dioxide. Discussed Rx topical bleaching creams (i.e. hydroquinone), OTC HelioCare supplement, chemical peels (would need multiple for best result).    Continue  Finacea foam use 1 - 2 times daily  Continue Tranexamic acid 650mg  1/2 po BID Continue Heliocare 2 tabs PO AM and mid day during spring/summer    Restart [employer] Hydroquinone 8%, Tretinoin 0.1%,  Kojic acid 1%, Niacinamide 4%, Fluocinolone 0.025% cream, a pea sized amount nightly to dark spots on face for up to 2 months. This cannot be used more than 3 months due to risk of skin atrophy (thinning) and exogenous ochronosis (permanent dark spots). - Patient has purchased it- patient reports very drying to skin  - decrease usage to night a few times weekly also use with moisturizer. If patient can not tolerate - let us know and we can order a lower strength tretinoin.    Continue Elta MD Clear Tinted facial sunscreen spf 46 daily. Recommend daily broad spectrum sunscreen SPF 30+ to sun-exposed areas, reapply every 2 hours as needed. Staying in the shade or wearing long sleeves, sun glasses (UVA+UVB protection) and wide brim hats (4-inch brim around the entire circumference of the hat) are also recommended for sun protection.   May consider adding in future Cyspera cream (Cysteamine) sold at [facility] in [place]. Patient deferred at this time      Reordered Medications tranexamic acid (LYSTEDA) 650 MG TABS tablet  Other Related Medications Azelaic Acid (FINACEA) 15 % FOAM  Return in about 3 months (around 05/11/2021) for melasma follow up.  I, Ruthell Rummage, CMA, am acting as scribe for Brendolyn Patty, MD.  Documentation: I have reviewed the above documentation for accuracy and completeness, and I agree with the above.  Brendolyn Patty MD

## 2021-02-14 ENCOUNTER — Encounter: Payer: Self-pay | Admitting: Family Medicine

## 2021-02-14 DIAGNOSIS — G43811 Other migraine, intractable, with status migrainosus: Secondary | ICD-10-CM

## 2021-02-14 DIAGNOSIS — G500 Trigeminal neuralgia: Secondary | ICD-10-CM

## 2021-02-16 NOTE — Telephone Encounter (Signed)
Patient called in to check the status of her referral.

## 2021-02-21 ENCOUNTER — Other Ambulatory Visit: Payer: Self-pay

## 2021-02-21 MED FILL — Semaglutide Soln Pen-inj 1 MG/DOSE (4 MG/3ML): SUBCUTANEOUS | 28 days supply | Qty: 3 | Fill #0 | Status: AC

## 2021-02-22 ENCOUNTER — Other Ambulatory Visit: Payer: Self-pay

## 2021-02-23 ENCOUNTER — Other Ambulatory Visit: Payer: Self-pay

## 2021-02-23 MED ORDER — NURTEC 75 MG PO TBDP
ORAL_TABLET | ORAL | 6 refills | Status: DC
  Filled 2021-02-23: qty 8, 8d supply, fill #0
  Filled 2021-02-25: qty 8, 30d supply, fill #0

## 2021-02-25 ENCOUNTER — Other Ambulatory Visit: Payer: Self-pay

## 2021-03-01 ENCOUNTER — Other Ambulatory Visit: Payer: Self-pay

## 2021-03-15 ENCOUNTER — Other Ambulatory Visit: Payer: Self-pay

## 2021-03-15 MED ORDER — UBRELVY 100 MG PO TABS
ORAL_TABLET | ORAL | 3 refills | Status: DC
  Filled 2021-03-15: qty 10, 30d supply, fill #0
  Filled 2021-03-24: qty 10, 20d supply, fill #0
  Filled 2021-04-05: qty 10, 30d supply, fill #0

## 2021-03-16 ENCOUNTER — Other Ambulatory Visit: Payer: Self-pay

## 2021-03-17 ENCOUNTER — Other Ambulatory Visit: Payer: Self-pay

## 2021-03-23 ENCOUNTER — Other Ambulatory Visit: Payer: Self-pay

## 2021-03-23 ENCOUNTER — Encounter: Payer: Self-pay | Admitting: Family Medicine

## 2021-03-23 ENCOUNTER — Ambulatory Visit (INDEPENDENT_AMBULATORY_CARE_PROVIDER_SITE_OTHER): Payer: No Typology Code available for payment source | Admitting: Family Medicine

## 2021-03-23 VITALS — BP 121/81 | HR 96 | Temp 98.0°F | Wt 150.0 lb

## 2021-03-23 DIAGNOSIS — M9907 Segmental and somatic dysfunction of upper extremity: Secondary | ICD-10-CM

## 2021-03-23 DIAGNOSIS — M9901 Segmental and somatic dysfunction of cervical region: Secondary | ICD-10-CM | POA: Diagnosis not present

## 2021-03-23 DIAGNOSIS — M9900 Segmental and somatic dysfunction of head region: Secondary | ICD-10-CM

## 2021-03-23 DIAGNOSIS — M9902 Segmental and somatic dysfunction of thoracic region: Secondary | ICD-10-CM

## 2021-03-23 DIAGNOSIS — M9908 Segmental and somatic dysfunction of rib cage: Secondary | ICD-10-CM | POA: Diagnosis not present

## 2021-03-23 DIAGNOSIS — M542 Cervicalgia: Secondary | ICD-10-CM

## 2021-03-23 MED ORDER — CYCLOBENZAPRINE HCL 10 MG PO TABS
5.0000 mg | ORAL_TABLET | Freq: Every day | ORAL | 0 refills | Status: DC
  Filled 2021-03-23: qty 30, 30d supply, fill #0

## 2021-03-23 NOTE — Progress Notes (Signed)
BP 121/81   Pulse 96   Temp 98 F (36.7 C)   Wt 150 lb (68 kg)   SpO2 99%   BMI 30.30 kg/m    Subjective:    Patient ID: Ashley Wiley, female    DOB: 1975/12/04, 45 y.o.   MRN: [id number]  HPI: Ashley Wiley is a 45 y.o. female  Chief Complaint  Patient presents with  . Neck Pain   [name] presents today for evaluation and possible treatment with OMT for neck and upper back pain. She notes that she has been feeling tight and sore for a couple of weeks. Her pain is in her upper back and neck. She notes that her pain is tight and sore. Better with stretching. Worse with working and stress. Pain will radiate into her head and give her a headache. She is otherwise doing well with no other concerns or complaints at this time.   Relevant past medical, surgical, family and social history reviewed and updated as indicated. Interim medical history since our last visit reviewed. Allergies and medications reviewed and updated.  Review of Systems  Constitutional: Negative.   Respiratory: Negative.   Cardiovascular: Negative.   Gastrointestinal: Negative.   Musculoskeletal: Positive for myalgias, neck pain and neck stiffness. Negative for arthralgias, back pain, gait problem and joint swelling.  Skin: Negative.   Neurological: Negative.   Psychiatric/Behavioral: Negative.     Per HPI unless specifically indicated above     Objective:    BP 121/81   Pulse 96   Temp 98 F (36.7 C)   Wt 150 lb (68 kg)   SpO2 99%   BMI 30.30 kg/m   Wt Readings from Last 3 Encounters:  03/23/21 150 lb (68 kg)  02/02/21 150 lb (68 kg)  01/06/21 154 lb 9.6 oz (70.1 kg)    Physical Exam Vitals and nursing note reviewed.  Constitutional:      General: She is not in acute distress.    Appearance: Normal appearance. She is not ill-appearing.  HENT:     Head: Normocephalic and atraumatic.     Right Ear: External ear normal.     Left Ear: External ear normal.     Nose: Nose normal.      Mouth/Throat:     Mouth: Mucous membranes are moist.     Pharynx: Oropharynx is clear.  Eyes:     Extraocular Movements: Extraocular movements intact.     Conjunctiva/sclera: Conjunctivae normal.     Pupils: Pupils are equal, round, and reactive to light.  Neck:     Vascular: No carotid bruit.  Cardiovascular:     Rate and Rhythm: Normal rate.     Pulses: Normal pulses.  Pulmonary:     Effort: Pulmonary effort is normal. No respiratory distress.  Abdominal:     General: Abdomen is flat. There is no distension.     Palpations: Abdomen is soft. There is no mass.     Tenderness: There is no abdominal tenderness. There is no right CVA tenderness, left CVA tenderness, guarding or rebound.     Hernia: No hernia is present.  Musculoskeletal:     Cervical back: No muscular tenderness.  Lymphadenopathy:     Cervical: No cervical adenopathy.  Skin:    General: Skin is warm and dry.     Capillary Refill: Capillary refill takes less than 2 seconds.     Coloration: Skin is not jaundiced or pale.     Findings: No bruising, erythema,  lesion or rash.  Neurological:     General: No focal deficit present.     Mental Status: She is alert. Mental status is at baseline.  Psychiatric:        Mood and Affect: Mood normal.        Behavior: Behavior normal.        Thought Content: Thought content normal.        Judgment: Judgment normal.   Musculoskeletal:  Exam found Decreased ROM, Tissue texture changes, Tenderness to palpation and Asymmetry of patient's  head, neck, thorax, ribs and upper extremity Osteopathic Structural Exam:   Head: hypertonic suboccipital muscles, OAESSR  Neck: C4ESRR, hypertonic SCM bilaterally R>L  Thorax: T4-5SRRL  Ribs: Rib 6 locked up on the R  Upper Extremity: pecs hypertonic bilaterally, clavicles restricted bilaterally   Results for orders placed or performed in visit on 01/06/21  Bayer DCA Hb A1c Waived  Result Value Ref Range   HB A1C (BAYER DCA - WAIVED) 5.1  <7.0 %  CBC with Differential/Platelet  Result Value Ref Range   WBC 7.5 3.4 - 10.8 x10E3/uL   RBC 4.16 3.77 - 5.28 x10E6/uL   Hemoglobin 12.8 11.1 - 15.9 g/dL   Hematocrit 39.8 34.0 - 46.6 %   MCV 96 79 - 97 fL   MCH 30.8 26.6 - 33.0 pg   MCHC 32.2 31.5 - 35.7 g/dL   RDW 12.4 11.7 - 15.4 %   Platelets 187 150 - 450 x10E3/uL   Neutrophils 64 Not Estab. %   Lymphs 28 Not Estab. %   Monocytes 6 Not Estab. %   Eos 2 Not Estab. %   Basos 0 Not Estab. %   Neutrophils Absolute 4.8 1.4 - 7.0 x10E3/uL   Lymphocytes Absolute 2.1 0.7 - 3.1 x10E3/uL   Monocytes Absolute 0.4 0.1 - 0.9 x10E3/uL   EOS (ABSOLUTE) 0.1 0.0 - 0.4 x10E3/uL   Basophils Absolute 0.0 0.0 - 0.2 x10E3/uL   Immature Granulocytes 0 Not Estab. %   Immature Grans (Abs) 0.0 0.0 - 0.1 x10E3/uL  Comprehensive metabolic panel  Result Value Ref Range   Glucose 79 65 - 99 mg/dL   BUN 6 6 - 24 mg/dL   Creatinine, Ser 0.58 0.57 - 1.00 mg/dL   GFR calc non Af Amer 113 >59 mL/min/1.73   GFR calc Af Amer 131 >59 mL/min/1.73   BUN/Creatinine Ratio 10 9 - 23   Sodium 139 134 - 144 mmol/L   Potassium 3.9 3.5 - 5.2 mmol/L   Chloride 101 96 - 106 mmol/L   CO2 23 20 - 29 mmol/L   Calcium 9.2 8.7 - 10.2 mg/dL   Total Protein 6.7 6.0 - 8.5 g/dL   Albumin 4.4 3.8 - 4.8 g/dL   Globulin, Total 2.3 1.5 - 4.5 g/dL   Albumin/Globulin Ratio 1.9 1.2 - 2.2   Bilirubin Total 0.5 0.0 - 1.2 mg/dL   Alkaline Phosphatase 100 44 - 121 IU/L   AST 34 0 - 40 IU/L   ALT 37 (H) 0 - 32 IU/L  Lipid Panel w/o Chol/HDL Ratio  Result Value Ref Range   Cholesterol, Total 99 (L) 100 - 199 mg/dL   Triglycerides 85 0 - 149 mg/dL   HDL 47 >39 mg/dL   VLDL Cholesterol Cal 17 5 - 40 mg/dL   LDL Chol Calc (NIH) 35 0 - 99 mg/dL  VITAMIN D 25 Hydroxy (Vit-D Deficiency, Fractures)  Result Value Ref Range   Vit D, 25-Hydroxy 38.9 30.0 - 100.0  ng/mL      Assessment & Plan:   Problem List Items Addressed This Visit   None   Visit Diagnoses    Neck pain     -  Primary   She does have somatic dysfunction that is contributing to her symptoms. Treated today with good results as below. Flexeril sent to her pharmacy. Call w concerns   Thoracic segment dysfunction       Segmental dysfunction of rib cage       Head region somatic dysfunction       Cervical segment dysfunction       Somatic dysfunction of upper extremities         After verbal consent was obtained, patient was treated today with osteopathic manipulative medicine to the regions of the head, neck, thorax, ribs and upper extremity using the techniques of cranial, myofascial release, counterstrain, muscle energy, HVLA and soft tissue. Areas of compensation relating to her primary pain source also treated. Patient tolerated the procedure well with good objective and good subjective improvement in symptoms. She left the room in good condition. She was advised to stay well hydrated and that she may have some soreness following the procedure. If not improving or worsening, she will call and come in. Home exercise program of stretches for pecs discussed and demonstrated today. Patient will do these stretches BID to before the point of pain, and will return for reevaluation  on a PRN basis.  Follow up plan: Return if symptoms worsen or fail to improve.

## 2021-03-24 ENCOUNTER — Other Ambulatory Visit: Payer: Self-pay

## 2021-03-24 MED FILL — Tranexamic Acid Tab 650 MG: ORAL | 30 days supply | Qty: 30 | Fill #0 | Status: AC

## 2021-04-04 ENCOUNTER — Other Ambulatory Visit (HOSPITAL_COMMUNITY): Payer: Self-pay

## 2021-04-04 ENCOUNTER — Encounter: Payer: Self-pay | Admitting: Family Medicine

## 2021-04-04 MED FILL — Azelaic Acid Foam 15%: CUTANEOUS | 30 days supply | Qty: 50 | Fill #0 | Status: CN

## 2021-04-05 ENCOUNTER — Other Ambulatory Visit: Payer: Self-pay

## 2021-04-06 ENCOUNTER — Other Ambulatory Visit (HOSPITAL_COMMUNITY): Payer: Self-pay

## 2021-04-14 ENCOUNTER — Other Ambulatory Visit (HOSPITAL_COMMUNITY): Payer: Self-pay

## 2021-04-15 ENCOUNTER — Other Ambulatory Visit: Payer: Self-pay

## 2021-04-15 MED FILL — Azelaic Acid Foam 15%: CUTANEOUS | 30 days supply | Qty: 50 | Fill #0 | Status: AC

## 2021-04-15 MED FILL — Tranexamic Acid Tab 650 MG: ORAL | 30 days supply | Qty: 30 | Fill #1 | Status: AC

## 2021-04-19 ENCOUNTER — Other Ambulatory Visit: Payer: Self-pay

## 2021-04-20 ENCOUNTER — Other Ambulatory Visit (HOSPITAL_COMMUNITY): Payer: Self-pay

## 2021-04-20 ENCOUNTER — Other Ambulatory Visit: Payer: Self-pay

## 2021-04-20 MED ORDER — BOTOX 200 UNITS IJ SOLR
INTRAMUSCULAR | 4 refills | Status: DC
  Filled 2021-04-20: qty 1, 30d supply, fill #0
  Filled 2021-04-22 – 2021-04-26 (×3): qty 1, 90d supply, fill #0

## 2021-04-21 ENCOUNTER — Other Ambulatory Visit (HOSPITAL_COMMUNITY): Payer: Self-pay

## 2021-04-21 ENCOUNTER — Other Ambulatory Visit: Payer: Self-pay

## 2021-04-22 ENCOUNTER — Other Ambulatory Visit: Payer: Self-pay

## 2021-04-22 ENCOUNTER — Other Ambulatory Visit (HOSPITAL_COMMUNITY): Payer: Self-pay

## 2021-04-26 ENCOUNTER — Other Ambulatory Visit (HOSPITAL_COMMUNITY): Payer: Self-pay

## 2021-04-28 ENCOUNTER — Other Ambulatory Visit: Payer: Self-pay | Admitting: Neurosurgery

## 2021-04-28 ENCOUNTER — Other Ambulatory Visit (HOSPITAL_COMMUNITY): Payer: Self-pay

## 2021-04-28 DIAGNOSIS — I671 Cerebral aneurysm, nonruptured: Secondary | ICD-10-CM

## 2021-05-04 ENCOUNTER — Ambulatory Visit (INDEPENDENT_AMBULATORY_CARE_PROVIDER_SITE_OTHER): Payer: No Typology Code available for payment source | Admitting: Internal Medicine

## 2021-05-04 ENCOUNTER — Other Ambulatory Visit: Payer: Self-pay

## 2021-05-04 ENCOUNTER — Ambulatory Visit
Admission: RE | Admit: 2021-05-04 | Discharge: 2021-05-04 | Disposition: A | Discharge status: Home / Self Care | Payer: No Typology Code available for payment source | Source: Ambulatory Visit | Attending: Internal Medicine | Admitting: Internal Medicine

## 2021-05-04 ENCOUNTER — Ambulatory Visit
Admission: RE | Admit: 2021-05-04 | Discharge: 2021-05-04 | Disposition: A | Discharge status: Home / Self Care | Payer: No Typology Code available for payment source | Attending: Internal Medicine | Admitting: Internal Medicine

## 2021-05-04 VITALS — BP 110/74 | HR 88 | Temp 98.8°F | Ht 59.0 in | Wt 156.0 lb

## 2021-05-04 DIAGNOSIS — M2550 Pain in unspecified joint: Secondary | ICD-10-CM

## 2021-05-04 DIAGNOSIS — M542 Cervicalgia: Secondary | ICD-10-CM | POA: Insufficient documentation

## 2021-05-04 DIAGNOSIS — G629 Polyneuropathy, unspecified: Secondary | ICD-10-CM

## 2021-05-04 MED ORDER — LIDOCAINE 5 % EX PTCH
1.0000 | MEDICATED_PATCH | CUTANEOUS | 1 refills | Status: DC
  Filled 2021-05-04: qty 30, 30d supply, fill #0

## 2021-05-04 MED ORDER — DICLOFENAC SODIUM 1 % EX GEL
2.0000 g | Freq: Four times a day (QID) | CUTANEOUS | 2 refills | Status: DC
  Filled 2021-05-04: qty 100, 10d supply, fill #0

## 2021-05-04 NOTE — Progress Notes (Signed)
There were no vitals taken for this visit.   Subjective:    Patient ID: Ashley Wiley, female    DOB: [birth year], 45 y.o.   MRN: [id number]  Chief Complaint  Patient presents with  . Back Pain  . Headache    HPI: Ashley Wiley is a 45 y.o. female  Pt co body aches and pains there all the time sometimes bothers her more. Takes tylenol 1000 mg daily and sometimes twice a day but this week starting Monday 650 x 2 bid.,  Co arthralgias x 6 months has a FH of SLE sister has this . No cp no sob  Has neck pain  Headache  This is a chronic problem. Associated symptoms include neck pain. Pertinent negatives include no abdominal pain, back pain, dizziness, fever, loss of balance, nausea or visual change.  Neurologic Problem The patient's pertinent negatives include no altered mental status, clumsiness, focal sensory loss, focal weakness, loss of balance, near-syncope, slurred speech, syncope or visual change. Primary symptoms comment: c/o numbness / tingling  burning in her lower ext and upper ext. The current episode started more than 1 month ago. The problem has been gradually worsening since onset. Associated symptoms include neck pain. Pertinent negatives include no abdominal pain, auditory change, aura, back pain, bladder incontinence, confusion, diaphoresis, dizziness, fatigue, fever, headaches, nausea, palpitations, shortness of breath or vertigo.    Chief Complaint  Patient presents with  . Back Pain  . Headache    Relevant past medical, surgical, family and social history reviewed and updated as indicated. Interim medical history since our last visit reviewed. Allergies and medications reviewed and updated.  Review of Systems  Constitutional:  Negative for diaphoresis, fatigue and fever.  Respiratory:  Negative for shortness of breath.   Cardiovascular:  Negative for palpitations and near-syncope.  Gastrointestinal:  Negative for abdominal pain and nausea.   Genitourinary:  Negative for bladder incontinence.  Musculoskeletal:  Positive for neck pain. Negative for back pain.  Neurological:  Negative for dizziness, vertigo, focal weakness, syncope, headaches and loss of balance.  Psychiatric/Behavioral:  Negative for confusion.    Per HPI unless specifically indicated above     Objective:    There were no vitals taken for this visit.  Wt Readings from Last 3 Encounters:  03/23/21 150 lb (68 kg)  02/02/21 150 lb (68 kg)  01/06/21 154 lb 9.6 oz (70.1 kg)    Physical Exam  Results for orders placed or performed in visit on 01/06/21  Bayer DCA Hb A1c Waived  Result Value Ref Range   HB A1C (BAYER DCA - WAIVED) 5.1 <7.0 %  CBC with Differential/Platelet  Result Value Ref Range   WBC 7.5 3.4 - 10.8 x10E3/uL   RBC 4.16 3.77 - 5.28 x10E6/uL   Hemoglobin 12.8 11.1 - 15.9 g/dL   Hematocrit 39.8 34.0 - 46.6 %   MCV 96 79 - 97 fL   MCH 30.8 26.6 - 33.0 pg   MCHC 32.2 31.5 - 35.7 g/dL   RDW 12.4 11.7 - 15.4 %   Platelets 187 150 - 450 x10E3/uL   Neutrophils 64 Not Estab. %   Lymphs 28 Not Estab. %   Monocytes 6 Not Estab. %   Eos 2 Not Estab. %   Basos 0 Not Estab. %   Neutrophils Absolute 4.8 1.4 - 7.0 x10E3/uL   Lymphocytes Absolute 2.1 0.7 - 3.1 x10E3/uL   Monocytes Absolute 0.4 0.1 - 0.9 x10E3/uL   EOS (ABSOLUTE) 0.1  0.0 - 0.4 x10E3/uL   Basophils Absolute 0.0 0.0 - 0.2 x10E3/uL   Immature Granulocytes 0 Not Estab. %   Immature Grans (Abs) 0.0 0.0 - 0.1 x10E3/uL  Comprehensive metabolic panel  Result Value Ref Range   Glucose 79 65 - 99 mg/dL   BUN 6 6 - 24 mg/dL   Creatinine, Ser 0.58 0.57 - 1.00 mg/dL   GFR calc non Af Amer 113 >59 mL/min/1.73   GFR calc Af Amer 131 >59 mL/min/1.73   BUN/Creatinine Ratio 10 9 - 23   Sodium 139 134 - 144 mmol/L   Potassium 3.9 3.5 - 5.2 mmol/L   Chloride 101 96 - 106 mmol/L   CO2 23 20 - 29 mmol/L   Calcium 9.2 8.7 - 10.2 mg/dL   Total Protein 6.7 6.0 - 8.5 g/dL   Albumin 4.4 3.8 - 4.8  g/dL   Globulin, Total 2.3 1.5 - 4.5 g/dL   Albumin/Globulin Ratio 1.9 1.2 - 2.2   Bilirubin Total 0.5 0.0 - 1.2 mg/dL   Alkaline Phosphatase 100 44 - 121 IU/L   AST 34 0 - 40 IU/L   ALT 37 (H) 0 - 32 IU/L  Lipid Panel w/o Chol/HDL Ratio  Result Value Ref Range   Cholesterol, Total 99 (L) 100 - 199 mg/dL   Triglycerides 85 0 - 149 mg/dL   HDL 47 >39 mg/dL   VLDL Cholesterol Cal 17 5 - 40 mg/dL   LDL Chol Calc (NIH) 35 0 - 99 mg/dL  VITAMIN D 25 Hydroxy (Vit-D Deficiency, Fractures)  Result Value Ref Range   Vit D, 25-Hydroxy 38.9 30.0 - 100.0 ng/mL        Current Outpatient Medications:  .  Azelaic Acid 15 % FOAM, APPLY TO FACE EVERY MORNING AND WASH OFF EVERY NIGHT AT BEDTIME, Disp: 50 g, Rfl: 2 .  Botulinum Toxin Type A (BOTOX) 200 units SOLR, Inject 200 units intramuscularly to face/neck every 3 months by md, Disp: 1 each, Rfl: 4 .  busPIRone (BUSPAR) 5 MG tablet, TAKE 1 TABLET BY MOUTH 3 TIMES DAILY AS NEEDED., Disp: 270 tablet, Rfl: 1 .  cyclobenzaprine (FLEXERIL) 10 MG tablet, Take 0.5-1 tablets (5-10 mg total) by mouth at bedtime., Disp: 30 tablet, Rfl: 0 .  fluticasone (FLONASE) 50 MCG/ACT nasal spray, PLACE 2 SPRAYS INTO BOTH NOSTRILS DAILY AS NEEDED FOR ALLERGIES., Disp: 16 g, Rfl: 12 .  glucose blood test strip, CHECK BLOOD SUGAR DAILY, Disp: 100 strip, Rfl: 12 .  loratadine (CLARITIN) 10 MG tablet, Take 10 mg by mouth daily as needed for allergies. , Disp: , Rfl:  .  metFORMIN (GLUCOPHAGE-XR) 500 MG 24 hr tablet, TAKE 1 TABLET BY MOUTH IN THE MORNING AND AT BEDTIME., Disp: 180 tablet, Rfl: 1 .  naproxen (NAPROSYN) 500 MG tablet, Take 1 tablet (500 mg total) by mouth 2 (two) times daily with a meal., Disp: 180 tablet, Rfl: 1 .  nortriptyline (PAMELOR) 50 MG capsule, TAKE 1 CAPSULE BY MOUTH AT BEDTIME, Disp: 90 capsule, Rfl: 1 .  Semaglutide, 1 MG/DOSE, (OZEMPIC, 1 MG/DOSE,) 2 MG/1.5ML SOPN, Inject 1 mg into the skin once a week., Disp: 1.5 mL, Rfl: 6 .  sertraline (ZOLOFT)  100 MG tablet, TAKE 1 & 1/2 TABLETS BY MOUTH EVERY MORNING, Disp: 135 tablet, Rfl: 1 .  topiramate (TOPAMAX) 50 MG tablet, TAKE 1 TABLET BY MOUTH DAILY. (Patient taking differently: Take 50 mg by mouth daily. Patient taking 2 tabs qd), Disp: 90 tablet, Rfl: 1 .  tranexamic  acid (LYSTEDA) 650 MG TABS tablet, TAKE 1/2 TABLET BY MOUTH TWICE A DAY FOR MELASMA, Disp: 30 tablet, Rfl: 3 .  Ubrogepant (UBRELVY) 100 MG TABS, Take 1 tablet (100 mg) by mouth as directed. Can repeat 2nd dose in 2 hours. No more than 200 mg/day, Disp: 10 tablet, Rfl: 3    Assessment & Plan:  Bil hand swelling / Arthralgias  Will check labs for AI wu  2. Neck pain with tingling nubness / burning pain will check Xray of her C spine.  3. Neuropathy ? Etiology ? Diabetic neuropathy vs other etio  might need Neuro imaging Pt would like to be  referred to Neurology for such.        Problem List Items Addressed This Visit   None    Orders Placed This Encounter  Procedures  . DG Cervical Spine Complete  . DG Hand Complete Left  . DG Hand Complete Right  . Rheumatoid factor  . ANA  . ANA+ENA+DNA/DS+Antich+Centr  . CK  . Uric acid  . CBC with Differential/Platelet  . Lyme Ab/Western Blot Reflex  . Ambulatory referral to Neurology     No orders of the defined types were placed in this encounter.    Follow up plan: No follow-ups on file.

## 2021-05-05 ENCOUNTER — Telehealth: Payer: Self-pay

## 2021-05-05 ENCOUNTER — Encounter: Payer: Self-pay | Admitting: Internal Medicine

## 2021-05-05 DIAGNOSIS — I671 Cerebral aneurysm, nonruptured: Secondary | ICD-10-CM

## 2021-05-05 NOTE — Telephone Encounter (Signed)
Patient called in requesting a referral to [facility] in [place]. Patient states she has has an appointment with Dr. Kathyrn Sheriff on 05/17/21 to follow up on an aneurysm.

## 2021-05-06 ENCOUNTER — Other Ambulatory Visit (HOSPITAL_COMMUNITY): Payer: Self-pay

## 2021-05-10 ENCOUNTER — Other Ambulatory Visit: Payer: Self-pay | Admitting: Neurosurgery

## 2021-05-10 ENCOUNTER — Other Ambulatory Visit: Payer: Self-pay | Admitting: Family Medicine

## 2021-05-10 ENCOUNTER — Other Ambulatory Visit: Payer: Self-pay

## 2021-05-10 ENCOUNTER — Ambulatory Visit: Payer: No Typology Code available for payment source | Admitting: Dermatology

## 2021-05-10 DIAGNOSIS — L811 Chloasma: Secondary | ICD-10-CM | POA: Diagnosis not present

## 2021-05-10 DIAGNOSIS — B351 Tinea unguium: Secondary | ICD-10-CM

## 2021-05-10 MED ORDER — TAVABOROLE 5 % EX SOLN: CUTANEOUS | 3 refills | Status: DC

## 2021-05-10 NOTE — Telephone Encounter (Signed)
Requested medication (s) are due for refill today: Yes  Requested medication (s) are on the active medication list: Yes  Last refill:  03/2020, 03/23/21  Future visit scheduled: Yes  Notes to clinic:  Unable to refill per protocol, Rx expired. Unable to refill per protocol, cannot delegate.      Requested Prescriptions  Pending Prescriptions Disp Refills   fluticasone (FLONASE) 50 MCG/ACT nasal spray 16 g 12    Sig: PLACE 2 SPRAYS INTO BOTH NOSTRILS DAILY AS NEEDED FOR ALLERGIES.      Ear, Nose, and Throat: Nasal Preparations - Corticosteroids Passed - 05/10/2021  3:45 PM      Passed - Valid encounter within last 12 months    Recent Outpatient Visits           6 days ago Neck pain   [place] Vigg, Avanti, MD   1 month ago Neck pain   [place], Megan P, DO   3 months ago Other migraine with status migrainosus, intractable   [facility] Jon Billings, NP   4 months ago Type 2 diabetes mellitus with hyperglycemia, without long-term current use of insulin ([facility])   [place], Megan P, DO   4 months ago Chronic bilateral low back pain without sciatica   [place], Barb Merino, DO       Future Appointments             In 1 month Brendolyn Patty, MD [place]   In 2 months Johnson, Barb Merino, DO [place], [facility]               cyclobenzaprine (FLEXERIL) 10 MG tablet 30 tablet 0    Sig: Take 0.5-1 tablets (5-10 mg total) by mouth at bedtime.      Not Delegated - Analgesics:  Muscle Relaxants Failed - 05/10/2021  3:45 PM      Failed - This refill cannot be delegated      Passed - Valid encounter within last 6 months    Recent Outpatient Visits           6 days ago Neck pain   [place] Vigg, Avanti, MD   1 month ago Neck pain   [place], Megan P, DO   3 months ago Other migraine with status migrainosus, intractable    [facility] Jon Billings, NP   4 months ago Type 2 diabetes mellitus with hyperglycemia, without long-term current use of insulin ([place])   [place], Megan P, DO   4 months ago Chronic bilateral low back pain without sciatica   [facility] Valerie Roys, DO       Future Appointments             In 1 month Brendolyn Patty, MD [place]   In 2 months Wynetta Emery, Barb Merino, DO [facility], [facility]

## 2021-05-10 NOTE — Progress Notes (Signed)
Follow-Up Visit   Subjective  Ashley Wiley is a 45 y.o. female who presents for the following: 3 month follow up (Patient here today for 3 month follow up on melasma. Patient feels skin is doing much better since last visit. Patient also reports concerns left great toenail. Patient reports she believes it is possible toenail fungus. ).  Pt taking oral tranexamic acid 325mg   bid, no side effects, no h/o blood clots.  She doesn't take oral contraceptive.  The following portions of the chart were reviewed this encounter and updated as appropriate:       Objective  Well appearing patient in no apparent distress; mood and affect are within normal limits.  A focused examination was performed including face and left great toenail. Relevant physical exam findings are noted in the Assessment and Plan.  B/L cheeks, upper lip, upper eyebrows Reticulated hyperpigmented patches. Some fading noted when compared to baseline       left great toenail Thickening of toenail with subungual debris      Assessment & Plan  Melasma B/L cheeks, upper lip, upper eyebrows  Chronic condition. Not curable. Improving but not at goal   Melasma is a condition of persistent pigmented patches generally on the face, worse in summer due to higher UV exposure.  Oral estrogen containing BCPs or supplements can exacerbate condition.  Recommend daily broad spectrum tinted sunscreen SPF 30+ to face, preferably with Zinc or Titanium Dioxide. Discussed Rx topical bleaching creams (i.e. hydroquinone), OTC HelioCare supplement, chemical peels (would need multiple for best result).     Increase Finacea foam apply 2 times daily Continue Tranexamic acid 650mg  1/2 po BID Continue Heliocare 2 tabs PO AM and mid day during spring/summer patient using 3 times weekly on skin medicinals cream -  increase as tolerated at [facility] Hydroquinone 8%, Tretinoin 0.1%, Kojic acid 1%, Niacinamide 4%, Fluocinolone 0.025%  cream, a pea sized amount nightly to dark spots on face for up to 2 months. This cannot be used more than 3 months due to risk of skin atrophy (thinning) and exogenous ochronosis (permanent dark spots). - Patient has purchased it- patient reports very drying to skin  - decrease usage to night a few times weekly also use with moisturizer. If patient can not tolerate - let us know and we can order a lower strength tretinoin.   Continue Elta MD Clear Tinted facial sunscreen spf 46 daily. Recommend daily broad spectrum sunscreen SPF 30+ to sun-exposed areas, reapply every 2 hours as needed. Staying in the shade or wearing long sleeves, sun glasses (UVA+UVB protection) and wide brim hats (4-inch brim around the entire circumference of the hat) are also recommended for sun protection.   Photos today   Related Medications tranexamic acid (LYSTEDA) 650 MG TABS tablet TAKE 1/2 TABLET BY MOUTH TWICE A DAY FOR MELASMA  Azelaic Acid 15 % FOAM APPLY TO FACE EVERY MORNING AND WASH OFF EVERY NIGHT AT BEDTIME  Tinea unguium left great toenail  Chronic condition, can take up to a year for toenail to grow out after treatment Pending labs start terbinafine 250 mg PO with food qd #30, 0rfs Prior labs 2/22 show increased mildly increased LFT's, will recheck on f/up Start Kerydin 5 % solution apply to aa's of toenail nightly. Rx sent to [facility]    Terbinafine is an anti-fungal medicine that can be applied to the skin (over the counter) or taken by mouth (prescription) to treat fungal infections. The pill version is  often used to treat fungal infections of the nails or scalp. While most people do not have any side effects from taking terbinafine pills, some possible side effects of the medicine can include taste changes, headache, loss of smell, vision changes, nausea, vomiting, or diarrhea.    Rare side effects can include irritation of the liver, allergic reaction, or decrease in blood counts (which  may show up as not feeling well or developing an infection). If you are concerned about any of these side effects, please stop the medicine and call your doctor, or in the case of an emergency such as feeling very unwell, seek immediate medical care.      Hepatic Function Panel - left great toenail  [place] (KERYDIN) 5 % SOLN - left great toenail Apply to affected toenail nightly  Return in about 1 month (around 06/09/2021) for follow up on toenail . I, Ruthell Rummage, CMA, am acting as scribe for Brendolyn Patty, MD.  Documentation: I have reviewed the above documentation for accuracy and completeness, and I agree with the above.  Brendolyn Patty MD

## 2021-05-10 NOTE — Patient Instructions (Addendum)
Melasma is a condition of persistent pigmented patches generally on the face, worse in summer due to higher UV exposure.  Oral estrogen containing BCPs or supplements can exacerbate condition.  Recommend daily broad spectrum tinted sunscreen SPF 30+ to face, preferably with Zinc or Titanium Dioxide. Discussed Rx topical bleaching creams (i.e. hydroquinone), OTC HelioCare supplement, chemical peels (would need multiple for best result).   Recommend daily broad spectrum sunscreen SPF 30+ to sun-exposed areas, reapply every 2 hours as needed. Call for new or changing lesions.  Staying in the shade or wearing long sleeves, sun glasses (UVA+UVB protection) and wide brim hats (4-inch brim around the entire circumference of the hat) are also recommended for sun protection.    Recommend taking Heliocare sun protection supplement daily in sunny weather for additional sun protection. For maximum protection on the sunniest days, you can take up to 2 capsules of regular Heliocare OR take 1 capsule of Heliocare Ultra. For prolonged exposure (such as a full day in the sun), you can repeat your dose of the supplement 4 hours after your first dose. Heliocare can be purchased at [facility] or at [email or web address].    Terbinafine Counseling  Terbinafine is an anti-fungal medicine that can be applied to the skin (over the counter) or taken by mouth (prescription) to treat fungal infections. The pill version is often used to treat fungal infections of the nails or scalp. While most people do not have any side effects from taking terbinafine pills, some possible side effects of the medicine can include taste changes, headache, loss of smell, vision changes, nausea, vomiting, or diarrhea.   Rare side effects can include irritation of the liver, allergic reaction, or decrease in blood counts (which may show up as not feeling well or developing an infection). If you are concerned about any of these side effects, please  stop the medicine and call your doctor, or in the case of an emergency such as feeling very unwell, seek immediate medical care.   If you have any questions or concerns for your doctor, please call our main line at [phone] and press option 4 to reach your doctor's medical assistant. If no one answers, please leave a voicemail as directed and we will return your call as soon as possible. Messages left after 4 pm will be answered the following business day.   You may also send [place] a message via [place]. We typically respond to MyChart messages within 1-2 business days.  For prescription refills, please ask your pharmacy to contact our office. Our fax number is [phone].  If you have an urgent issue when the clinic is closed that cannot wait until the next business day, you can page your doctor at the number below.    Please note that while we do our best to be available for urgent issues outside of office hours, we are not available 24/7.   If you have an urgent issue and are unable to reach [place], you may choose to seek medical care at your doctor's office, retail clinic, urgent care center, or emergency room.  If you have a medical emergency, please immediately call 911 or go to the emergency department.  Pager Numbers  - Dr. Nehemiah Massed: [phone]  - Dr. Laurence Ferrari: [phone]  - Dr. Nicole Kindred: [phone]  In the event of inclement weather, please call our main line at [phone] for an update on the status of any delays or closures.  Dermatology Medication Tips: Please keep the boxes that topical medications  come in in order to help keep track of the instructions about where and how to use these. Pharmacies typically print the medication instructions only on the boxes and not directly on the medication tubes.   If your medication is too expensive, please contact our office at [phone] option 4 or send [place] a message through [place].   We are unable to tell what your co-pay for  medications will be in advance as this is different depending on your insurance coverage. However, we may be able to find a substitute medication at lower cost or fill out paperwork to get insurance to cover a needed medication.   If a prior authorization is required to get your medication covered by your insurance company, please allow [place] 1-2 business days to complete this process.  Drug prices often vary depending on where the prescription is filled and some pharmacies may offer cheaper prices.  The website www.goodrx.com contains coupons for medications through different pharmacies. The prices here do not account for what the cost may be with help from insurance (it may be cheaper with your insurance), but the website can give you the price if you did not use any insurance.  - You can print the associated coupon and take it with your prescription to the pharmacy.  - You may also stop by our office during regular business hours and pick up a GoodRx coupon card.  - If you need your prescription sent electronically to a different pharmacy, notify our office through [facility] or by phone at [phone] option 4.

## 2021-05-11 ENCOUNTER — Encounter: Payer: Self-pay | Admitting: Dermatology

## 2021-05-11 ENCOUNTER — Telehealth: Payer: Self-pay

## 2021-05-11 ENCOUNTER — Other Ambulatory Visit: Payer: Self-pay

## 2021-05-11 LAB — HEPATIC FUNCTION PANEL
ALT: 32 IU/L (ref 0–32)
AST: 33 IU/L (ref 0–40)
Albumin: 4.6 g/dL (ref 3.8–4.8)
Alkaline Phosphatase: 83 IU/L (ref 44–121)
Bilirubin Total: 0.8 mg/dL (ref 0.0–1.2)
Bilirubin, Direct: 0.18 mg/dL (ref 0.00–0.40)
Total Protein: 6.9 g/dL (ref 6.0–8.5)

## 2021-05-11 MED ORDER — TERBINAFINE HCL 250 MG PO TABS
250.0000 mg | ORAL_TABLET | Freq: Every day | ORAL | 0 refills | Status: DC
  Filled 2021-05-11: qty 30, 30d supply, fill #0

## 2021-05-11 NOTE — Telephone Encounter (Signed)
Left pt msg to call for lab results/sh 

## 2021-05-11 NOTE — Telephone Encounter (Signed)
-----   Message from Brendolyn Patty, MD sent at 05/11/2021  8:19 AM EDT ----- LFTs are normal Okay to send in terbinafine 250 mg PO qd with food, #30, 0rfs  - please call patient

## 2021-05-11 NOTE — Telephone Encounter (Signed)
Pt has an appt scheduled 8/24

## 2021-05-12 ENCOUNTER — Other Ambulatory Visit: Payer: Self-pay | Admitting: Family Medicine

## 2021-05-12 ENCOUNTER — Other Ambulatory Visit: Payer: Self-pay

## 2021-05-12 ENCOUNTER — Other Ambulatory Visit (HOSPITAL_COMMUNITY): Payer: Self-pay

## 2021-05-12 LAB — CBC WITH DIFFERENTIAL/PLATELET
Basophils Absolute: 0 10*3/uL (ref 0.0–0.2)
Basos: 1 %
EOS (ABSOLUTE): 0.2 10*3/uL (ref 0.0–0.4)
Eos: 3 %
Hematocrit: 39.6 % (ref 34.0–46.6)
Hemoglobin: 12.7 g/dL (ref 11.1–15.9)
Immature Grans (Abs): 0 10*3/uL (ref 0.0–0.1)
Immature Granulocytes: 0 %
Lymphocytes Absolute: 2.4 10*3/uL (ref 0.7–3.1)
Lymphs: 30 %
MCH: 30.8 pg (ref 26.6–33.0)
MCHC: 32.1 g/dL (ref 31.5–35.7)
MCV: 96 fL (ref 79–97)
Monocytes Absolute: 0.6 10*3/uL (ref 0.1–0.9)
Monocytes: 7 %
Neutrophils Absolute: 4.7 10*3/uL (ref 1.4–7.0)
Neutrophils: 59 %
Platelets: 202 10*3/uL (ref 150–450)
RBC: 4.13 x10E6/uL (ref 3.77–5.28)
RDW: 12.4 % (ref 11.7–15.4)
WBC: 7.9 10*3/uL (ref 3.4–10.8)

## 2021-05-12 LAB — ANA+ENA+DNA/DS+ANTICH+CENTR
ANA Titer 1: NEGATIVE
Anti JO-1: <0.2 AI (ref 0.0–0.9)
Centromere Ab Screen: <0.2 AI (ref 0.0–0.9)
Chromatin Ab SerPl-aCnc: <0.2 AI (ref 0.0–0.9)
ENA RNP Ab: <0.2 AI (ref 0.0–0.9)
ENA SM Ab Ser-aCnc: <0.2 AI (ref 0.0–0.9)
ENA SSA (RO) Ab: <0.2 AI (ref 0.0–0.9)
ENA SSB (LA) Ab: <0.2 AI (ref 0.0–0.9)
Scleroderma (Scl-70) (ENA) Antibody, IgG: <0.2 AI (ref 0.0–0.9)
dsDNA Ab: <1 IU/mL (ref 0–9)

## 2021-05-12 LAB — URIC ACID: Uric Acid: 4.8 mg/dL (ref 2.6–6.2)

## 2021-05-12 LAB — ANA: Anti Nuclear Antibody (ANA): NEGATIVE

## 2021-05-12 LAB — CK: Total CK: 48 U/L (ref 32–182)

## 2021-05-12 LAB — RHEUMATOID FACTOR: Rheumatoid fact SerPl-aCnc: <10 IU/mL (ref <14.0)

## 2021-05-12 MED ORDER — FLUTICASONE PROPIONATE 50 MCG/ACT NA SUSP
NASAL | 12 refills | Status: DC
  Filled 2021-05-12: qty 16, 30d supply, fill #0
  Filled 2021-07-06: qty 16, 30d supply, fill #1

## 2021-05-12 NOTE — Telephone Encounter (Signed)
Requested medication (s) are due for refill today: yes , requested today   Requested medication (s) are on the active medication list: yes   Last refill:  03/23/21 #30 0 refills   Future visit scheduled: yes in 2 months  Notes to clinic:  not delegated per protocol     Requested Prescriptions  Pending Prescriptions Disp Refills   cyclobenzaprine (FLEXERIL) 10 MG tablet 30 tablet 0    Sig: Take 0.5-1 tablets (5-10 mg total) by mouth at bedtime.      Not Delegated - Analgesics:  Muscle Relaxants Failed - 05/12/2021  5:20 PM      Failed - This refill cannot be delegated      Passed - Valid encounter within last 6 months    Recent Outpatient Visits           1 week ago Neck pain   [place], MD   1 month ago Neck pain   [place], Megan P, DO   3 months ago Other migraine with status migrainosus, intractable   [facility] Jon Billings, NP   4 months ago Type 2 diabetes mellitus with hyperglycemia, without long-term current use of insulin ([place])   [place], Megan P, DO   4 months ago Chronic bilateral low back pain without sciatica   [facility] Valerie Roys, DO       Future Appointments             In 1 month Brendolyn Patty, MD [place]   In 2 months Wynetta Emery, Barb Merino, DO [facility]

## 2021-05-13 ENCOUNTER — Other Ambulatory Visit: Payer: Self-pay

## 2021-05-13 ENCOUNTER — Other Ambulatory Visit: Payer: Self-pay | Admitting: Family Medicine

## 2021-05-13 MED ORDER — OZEMPIC (1 MG/DOSE) 4 MG/3ML ~~LOC~~ SOPN
1.0000 mg | PEN_INJECTOR | SUBCUTANEOUS | 6 refills | Status: DC
Start: 2021-05-13 — End: 2021-10-19
  Filled 2021-05-13: qty 3, 28d supply, fill #0
  Filled 2021-07-06: qty 3, 28d supply, fill #1
  Filled 2021-08-04: qty 3, 28d supply, fill #2
  Filled 2021-09-19: qty 3, 28d supply, fill #3

## 2021-05-13 MED ORDER — TIZANIDINE HCL 4 MG PO TABS
ORAL_TABLET | ORAL | 0 refills | Status: DC
  Filled 2021-05-13: qty 30, 10d supply, fill #0

## 2021-05-13 MED ORDER — IBUPROFEN 800 MG PO TABS
ORAL_TABLET | ORAL | 0 refills | Status: DC
  Filled 2021-05-13: qty 30, 10d supply, fill #0

## 2021-05-13 MED FILL — Cyclobenzaprine HCl Tab 10 MG: ORAL | 30 days supply | Qty: 30 | Fill #0 | Status: AC

## 2021-05-13 NOTE — Telephone Encounter (Signed)
Possible duplicate. Pt is scheduled 8/24

## 2021-05-16 ENCOUNTER — Other Ambulatory Visit: Payer: Self-pay | Admitting: Student

## 2021-05-17 ENCOUNTER — Encounter (HOSPITAL_COMMUNITY): Payer: Self-pay

## 2021-05-17 ENCOUNTER — Other Ambulatory Visit: Payer: Self-pay | Admitting: Neurosurgery

## 2021-05-17 ENCOUNTER — Ambulatory Visit (HOSPITAL_COMMUNITY)
Admission: RE | Admit: 2021-05-17 | Discharge: 2021-05-17 | Disposition: A | Discharge status: Home / Self Care | Payer: No Typology Code available for payment source | Source: Ambulatory Visit | Attending: Neurosurgery | Admitting: Neurosurgery

## 2021-05-17 DIAGNOSIS — Z7984 Long term (current) use of oral hypoglycemic drugs: Secondary | ICD-10-CM | POA: Diagnosis not present

## 2021-05-17 DIAGNOSIS — Z888 Allergy status to other drugs, medicaments and biological substances status: Secondary | ICD-10-CM | POA: Diagnosis not present

## 2021-05-17 DIAGNOSIS — Z791 Long term (current) use of non-steroidal anti-inflammatories (NSAID): Secondary | ICD-10-CM | POA: Diagnosis not present

## 2021-05-17 DIAGNOSIS — I671 Cerebral aneurysm, nonruptured: Secondary | ICD-10-CM

## 2021-05-17 HISTORY — PX: IR ANGIO VERTEBRAL SEL VERTEBRAL UNI R MOD SED: IMG5368

## 2021-05-17 HISTORY — PX: IR ANGIO INTRA EXTRACRAN SEL INTERNAL CAROTID BILAT MOD SED: IMG5363

## 2021-05-17 HISTORY — PX: IR US GUIDE VASC ACCESS RIGHT: IMG2390

## 2021-05-17 LAB — CBC WITH DIFFERENTIAL/PLATELET
Abs Immature Granulocytes: 0.03 10*3/uL (ref 0.00–0.07)
Basophils Absolute: 0 10*3/uL (ref 0.0–0.1)
Basophils Relative: 0 %
Eosinophils Absolute: 0.3 10*3/uL (ref 0.0–0.5)
Eosinophils Relative: 4 %
HCT: 40 % (ref 36.0–46.0)
Hemoglobin: 13.1 g/dL (ref 12.0–15.0)
Immature Granulocytes: 0 %
Lymphocytes Relative: 33 %
Lymphs Abs: 2.3 10*3/uL (ref 0.7–4.0)
MCH: 31 pg (ref 26.0–34.0)
MCHC: 32.8 g/dL (ref 30.0–36.0)
MCV: 94.8 fL (ref 80.0–100.0)
Monocytes Absolute: 0.5 10*3/uL (ref 0.1–1.0)
Monocytes Relative: 8 %
Neutro Abs: 3.8 10*3/uL (ref 1.7–7.7)
Neutrophils Relative %: 55 %
Platelets: 196 10*3/uL (ref 150–400)
RBC: 4.22 MIL/uL (ref 3.87–5.11)
RDW: 12.5 % (ref 11.5–15.5)
WBC: 7.1 10*3/uL (ref 4.0–10.5)
nRBC: 0 % (ref 0.0–0.2)

## 2021-05-17 LAB — BASIC METABOLIC PANEL
Anion gap: 12 (ref 5–15)
BUN: 9 mg/dL (ref 6–20)
CO2: 25 mmol/L (ref 22–32)
Calcium: 9 mg/dL (ref 8.9–10.3)
Chloride: 100 mmol/L (ref 98–111)
Creatinine, Ser: 0.55 mg/dL (ref 0.44–1.00)
GFR, Estimated: >60 mL/min (ref >60)
Glucose, Bld: 89 mg/dL (ref 70–99)
Potassium: 3.8 mmol/L (ref 3.5–5.1)
Sodium: 137 mmol/L (ref 135–145)

## 2021-05-17 LAB — PROTIME-INR
INR: 0.9 (ref 0.8–1.2)
Prothrombin Time: 12.1 seconds (ref 11.4–15.2)

## 2021-05-17 MED ORDER — HEPARIN SODIUM (PORCINE) 1000 UNIT/ML IJ SOLN
INTRAMUSCULAR | Status: AC | PRN
  Administered 2021-05-17: 2000 [IU] via INTRAVENOUS

## 2021-05-17 MED ORDER — FENTANYL CITRATE (PF) 100 MCG/2ML IJ SOLN
INTRAMUSCULAR | Status: AC
  Filled 2021-05-17: qty 2

## 2021-05-17 MED ORDER — LIDOCAINE HCL 1 % IJ SOLN
INTRAMUSCULAR | Status: AC | PRN
  Administered 2021-05-17: 10 mL

## 2021-05-17 MED ORDER — FENTANYL CITRATE (PF) 100 MCG/2ML IJ SOLN
INTRAMUSCULAR | Status: AC | PRN
  Administered 2021-05-17: 25 ug via INTRAVENOUS

## 2021-05-17 MED ORDER — MIDAZOLAM HCL 2 MG/2ML IJ SOLN
INTRAMUSCULAR | Status: AC | PRN
  Administered 2021-05-17: 1 mg via INTRAVENOUS

## 2021-05-17 MED ORDER — HEPARIN SODIUM (PORCINE) 1000 UNIT/ML IJ SOLN
INTRAMUSCULAR | Status: AC
  Filled 2021-05-17: qty 1

## 2021-05-17 MED ORDER — SODIUM CHLORIDE 0.9 % IV SOLN: INTRAVENOUS | Status: DC

## 2021-05-17 MED ORDER — VERAPAMIL HCL 2.5 MG/ML IV SOLN
INTRAVENOUS | Status: AC
  Filled 2021-05-17: qty 2

## 2021-05-17 MED ORDER — NITROGLYCERIN 1 MG/10 ML FOR IR/CATH LAB
INTRA_ARTERIAL | Status: AC
  Filled 2021-05-17: qty 10

## 2021-05-17 MED ORDER — HYDROCODONE-ACETAMINOPHEN 5-325 MG PO TABS
1.0000 | ORAL_TABLET | ORAL | Status: DC | PRN
Start: 2021-05-17 — End: 2021-05-18

## 2021-05-17 MED ORDER — LIDOCAINE HCL 1 % IJ SOLN
INTRAMUSCULAR | Status: AC
  Filled 2021-05-17: qty 20

## 2021-05-17 MED ORDER — IOHEXOL 300 MG/ML  SOLN
100.0000 mL | Freq: Once | INTRAMUSCULAR | Status: AC | PRN
  Administered 2021-05-17: 35 mL via INTRAVENOUS

## 2021-05-17 MED ORDER — MIDAZOLAM HCL 2 MG/2ML IJ SOLN
INTRAMUSCULAR | Status: AC
  Filled 2021-05-17: qty 2

## 2021-05-17 NOTE — Sedation Documentation (Signed)
Procedure continues, no complaints from pt at this time. VSS.

## 2021-05-17 NOTE — Sedation Documentation (Signed)
Vital signs stable. 

## 2021-05-17 NOTE — Sedation Documentation (Signed)
Pt alert and oriented x4, consent signed. PT is NPO. Assisted pt to IR table without difficulty or complaint. VSS. Prepping pt for procedure at this time.

## 2021-05-17 NOTE — Sedation Documentation (Signed)
Pt tolerated procedure very well. IR tech holding manual pressure to right groin at this time after sheath removal.

## 2021-05-17 NOTE — Sedation Documentation (Signed)
Patient is resting comfortably. VSS °

## 2021-05-17 NOTE — Brief Op Note (Signed)
  NEUROSURGERY BRIEF OPERATIVE  NOTE   PREOP DX: Acom aneurysm  POSTOP DX: Same  PROCEDURE: Diagnostic cerebral angiogram  SURGEON: Dr. Consuella Lose, MD  ANESTHESIA: IV Sedation with Local  EBL: Minimal  SPECIMENS: None  COMPLICATIONS: None  CONDITION: Stable to recovery  FINDINGS (Full report in [facility]): 1. Persistent occlusion of previously coiled Acom aneurysm without aneurysm residual or recurrence. 2. No other aneurysms, AVM, or fistulas seen.   Consuella Lose, MD [facility]

## 2021-05-17 NOTE — Progress Notes (Signed)
Ambulated to  the bathroom and in hallway tol well no bleeding noted before or after ambulation.

## 2021-05-17 NOTE — Sedation Documentation (Signed)
Procedure started.

## 2021-05-17 NOTE — H&P (Signed)
Chief Complaint   Aneurysm  History of Present Illness  Ashley Wiley is a 45 y.o. female who underwent elective coil embolization of an Acom aneurysm in April 2019. She has done very well postop and presents today for routine long-term follow-up angiogram.  Past Medical History   Past Medical History:  Diagnosis Date   Allergy    Aneurysm ([place])    Anxiety    Back pain    Constipation    Depression    Dermatofibroma    Diabetes mellitus without complication ([place])    Takes Metformin  dx 2016   Facial pain    Facial pain    GERD (gastroesophageal reflux disease)    Headache    Hyperlipidemia    IFG (impaired fasting glucose)    Low HDL (under 40)    Migraine    Obesity    Overweight    Patella-femoral syndrome    Pinguecula    Prediabetes    Thyroid nodule april 2016   Vitamin B12 deficiency    Vitamin D deficiency disease     Past Surgical History   Past Surgical History:  Procedure Laterality Date   CARPAL TUNNEL RELEASE     GANGLION CYST EXCISION Left    IR 3D INDEPENDENT WKST  12/04/2017   IR ANGIO INTRA EXTRACRAN SEL INTERNAL CAROTID BILAT MOD SED  12/04/2017   IR ANGIO INTRA EXTRACRAN SEL INTERNAL CAROTID UNI L MOD SED  03/05/2018   IR ANGIO INTRA EXTRACRAN SEL INTERNAL CAROTID UNI L MOD SED  05/02/2019   IR ANGIO VERTEBRAL SEL VERTEBRAL BILAT MOD SED  12/04/2017   IR ANGIOGRAM FOLLOW UP STUDY  03/05/2018   IR ANGIOGRAM FOLLOW UP STUDY  03/05/2018   IR ANGIOGRAM FOLLOW UP STUDY  03/05/2018   IR NEURO EACH ADD'L AFTER BASIC UNI LEFT (MS)  03/05/2018   IR TRANSCATH/EMBOLIZ  03/05/2018   NASAL SINUS SURGERY  Nov 2015   RADIOLOGY WITH ANESTHESIA N/A 03/05/2018   Procedure: Arteriogram, coil embolization, possible stenting;  Surgeon: Consuella Lose, MD;  Location: [facility];  Service: Radiology;  Laterality: N/A;   TUBAL LIGATION      Social History   Social History   Tobacco Use   Smoking status: Never   Smokeless tobacco: Never  Vaping Use   Vaping Use:  Never used  Substance Use Topics   Alcohol use: Not Currently    Alcohol/week: 0.0 - 1.0 standard drinks    Comment: on occasion   Drug use: No    Medications   Prior to Admission medications   Medication Sig Start Date End Date Taking? Authorizing Provider  busPIRone (BUSPAR) 5 MG tablet TAKE 1 TABLET BY MOUTH 3 TIMES DAILY AS NEEDED. 01/06/21 01/06/22 Yes Johnson, Megan P, DO  cyclobenzaprine (FLEXERIL) 10 MG tablet Take 0.5-1 tablets (5-10 mg total) by mouth at bedtime. 05/13/21  Yes Johnson, Megan P, DO  diclofenac Sodium (VOLTAREN) 1 % GEL Apply 2 g topically 4 (four) times daily. 05/04/21  Yes Vigg, Avanti, MD  fluticasone (FLONASE) 50 MCG/ACT nasal spray PLACE 2 SPRAYS INTO BOTH NOSTRILS DAILY AS NEEDED FOR ALLERGIES. 05/12/21 05/12/22 Yes Johnson, Megan P, DO  ibuprofen (ADVIL) 800 MG tablet take 1 tablet by mouth 3 times a day until directed to stop 05/12/21  Yes   loratadine (CLARITIN) 10 MG tablet Take 10 mg by mouth daily as needed for allergies.    Yes [provider]  metFORMIN (GLUCOPHAGE-XR) 500 MG 24 hr tablet TAKE 1  TABLET BY MOUTH IN THE MORNING AND AT BEDTIME. 01/06/21 01/06/22 Yes Johnson, Megan P, DO  naproxen (NAPROSYN) 500 MG tablet Take 1 tablet (500 mg total) by mouth 2 (two) times daily with a meal. 03/30/20  Yes Johnson, Megan P, DO  Semaglutide, 1 MG/DOSE, (OZEMPIC, 1 MG/DOSE,) 4 MG/3ML SOPN Inject 1 mg into the skin once a week. 05/13/21  Yes Johnson, Megan P, DO  Tavaborole (KERYDIN) 5 % SOLN Apply to affected toenail nightly 05/10/21  Yes Brendolyn Patty, MD  tranexamic acid (LYSTEDA) 650 MG TABS tablet TAKE 1/2 TABLET BY MOUTH TWICE A DAY FOR MELASMA 02/08/21 02/08/22 Yes Brendolyn Patty, MD  Azelaic Acid 15 % FOAM APPLY TO FACE EVERY MORNING AND [place] OFF EVERY NIGHT AT BEDTIME 12/15/20 12/15/21  Brendolyn Patty, MD  Botulinum Toxin Type A (BOTOX) 200 units SOLR Inject 200 units intramuscularly to face/neck every 3 months by md 03/08/21     glucose blood test strip CHECK  BLOOD SUGAR DAILY 09/05/20 09/05/21  Park Liter P, DO  lidocaine (LIDODERM) 5 % Place 1 patch onto the skin daily. Remove & Discard patch within 12 hours or as directed by MD 05/04/21   Charlynne Cousins, MD  terbinafine (LAMISIL) 250 MG tablet Take 1 tablet (250 mg total) by mouth daily. 05/11/21   Brendolyn Patty, MD  tiZANidine (ZANAFLEX) 4 MG tablet take 1 tablet by mouth every 8 hours until directed to stop 05/12/21     Ubrogepant (UBRELVY) 100 MG TABS Take 1 tablet (100 mg) by mouth as directed. Can repeat 2nd dose in 2 hours. No more than 200 mg/day 03/15/21       Allergies   Allergies  Allergen Reactions   Imitrex [Sumatriptan] Other (See Comments)    Chest pain    Wellbutrin [Bupropion] Other (See Comments)    Increased anxiety and tremors    Review of Systems  ROS  Neurologic Exam  Awake, alert, oriented Memory and concentration grossly intact Speech fluent, appropriate CN grossly intact Motor exam: Upper Extremities Deltoid Bicep Tricep Grip  Right 5/5 5/5 5/5 5/5  Left 5/5 5/5 5/5 5/5   Lower Extremities IP Quad PF DF EHL  Right 5/5 5/5 5/5 5/5 5/5  Left 5/5 5/5 5/5 5/5 5/5   Sensation grossly intact to LT  Imaging  Previous angiogram dated 04/2019 reviewed and demonstrates occlusion of the treated Acom aneurysm.  Impression  - 45 y.o. female >70yrs s/p elective coil embolization of Acom aneurysm, doing well  Plan  - Will proceed with routine long-term angiogram  I have reviewed the procedure with the patient including the risks, benefits, and alternatives. All questions today were answered and she provided consent to proceed.  Consuella Lose, MD [facility]

## 2021-05-17 NOTE — Progress Notes (Signed)
Discharge instructions reviewed with pt and her husband. Both voice understanding.  

## 2021-05-17 NOTE — Sedation Documentation (Signed)
Patient is resting comfortably. 

## 2021-05-25 ENCOUNTER — Other Ambulatory Visit: Payer: Self-pay

## 2021-05-25 MED ORDER — DICLOFENAC POTASSIUM 50 MG PO TABS
ORAL_TABLET | ORAL | 0 refills | Status: DC
  Filled 2021-05-25: qty 60, 30d supply, fill #0

## 2021-05-26 ENCOUNTER — Other Ambulatory Visit: Payer: Self-pay

## 2021-05-30 ENCOUNTER — Other Ambulatory Visit (HOSPITAL_COMMUNITY): Payer: Self-pay

## 2021-05-30 ENCOUNTER — Encounter: Payer: Self-pay | Admitting: Family Medicine

## 2021-05-30 DIAGNOSIS — G629 Polyneuropathy, unspecified: Secondary | ICD-10-CM

## 2021-05-30 DIAGNOSIS — E1165 Type 2 diabetes mellitus with hyperglycemia: Secondary | ICD-10-CM

## 2021-05-30 DIAGNOSIS — M542 Cervicalgia: Secondary | ICD-10-CM

## 2021-06-01 ENCOUNTER — Other Ambulatory Visit: Payer: Self-pay

## 2021-06-01 ENCOUNTER — Encounter: Payer: Self-pay | Admitting: Family Medicine

## 2021-06-01 ENCOUNTER — Encounter: Payer: Self-pay | Admitting: Nurse Practitioner

## 2021-06-01 ENCOUNTER — Ambulatory Visit (INDEPENDENT_AMBULATORY_CARE_PROVIDER_SITE_OTHER): Payer: No Typology Code available for payment source | Admitting: Nurse Practitioner

## 2021-06-01 VITALS — BP 118/74 | HR 90 | Temp 98.3°F | Ht 59.0 in | Wt 153.0 lb

## 2021-06-01 DIAGNOSIS — R002 Palpitations: Secondary | ICD-10-CM | POA: Diagnosis not present

## 2021-06-01 NOTE — Progress Notes (Signed)
BP 118/74   Pulse 90   Temp 98.3 F (36.8 C) (Oral)   Ht 4\' 11"  (1.499 m)   Wt 153 lb (69.4 kg)   SpO2 98%   BMI 30.90 kg/m    Subjective:    Patient ID: Ashley Wiley, female    DOB: [birth year], 45 y.o.   MRN: [id number]  HPI: Ashley Wiley is a 45 y.o. female  Chief Complaint  Patient presents with   Palpitations    Talked to Dr. Lenna Sciara about, has been getting worse since   PALPITATIONS Patient feels like they are knocking on her chest.  They are happening more frequently and are lasting longer.  Duration: months Symptom description: Duration of episode: minutes Frequency: recurrentl Activity when event occurred:  can be doing any activity Related to exertion: no Dyspnea: no Chest pain: no Syncope: no Anxiety/stress: yes. Uses her Buspar and her HR is still elevated.  Nausea/vomiting:  nausea Diaphoresis: no Coronary artery disease: no Congestive heart failure: no Arrhythmia:no Thyroid disease: no Caffeine intake:  12-16 ounces of caffeine - symptoms still occur  despite caffeine Status:  worse Treatments attempted:none  Relevant past medical, surgical, family and social history reviewed and updated as indicated. Interim medical history since our last visit reviewed. Allergies and medications reviewed and updated.  Review of Systems  Eyes:  Negative for visual disturbance.  Respiratory:  Negative for cough, chest tightness and shortness of breath.   Cardiovascular:  Positive for palpitations. Negative for chest pain and leg swelling.  Gastrointestinal:  Positive for nausea. Negative for vomiting.  Neurological:  Negative for dizziness, syncope and headaches.  Psychiatric/Behavioral:  The patient is nervous/anxious.    Per HPI unless specifically indicated above     Objective:    BP 118/74   Pulse 90   Temp 98.3 F (36.8 C) (Oral)   Ht 4\' 11"  (1.499 m)   Wt 153 lb (69.4 kg)   SpO2 98%   BMI 30.90 kg/m   Wt Readings from Last 3 Encounters:   06/01/21 153 lb (69.4 kg)  05/17/21 155 lb (70.3 kg)  05/04/21 156 lb (70.8 kg)    Physical Exam Vitals and nursing note reviewed.  Constitutional:      General: She is not in acute distress.    Appearance: Normal appearance. She is normal weight. She is not ill-appearing, toxic-appearing or diaphoretic.  HENT:     Head: Normocephalic.     Right Ear: External ear normal.     Left Ear: External ear normal.     Nose: Nose normal.     Mouth/Throat:     Mouth: Mucous membranes are moist.     Pharynx: Oropharynx is clear.  Eyes:     General:        Right eye: No discharge.        Left eye: No discharge.     Extraocular Movements: Extraocular movements intact.     Conjunctiva/sclera: Conjunctivae normal.     Pupils: Pupils are equal, round, and reactive to light.  Cardiovascular:     Rate and Rhythm: Normal rate and regular rhythm.     Heart sounds: No murmur heard. Pulmonary:     Effort: Pulmonary effort is normal. No respiratory distress.     Breath sounds: Normal breath sounds. No wheezing or rales.  Musculoskeletal:     Cervical back: Normal range of motion and neck supple.  Skin:    General: Skin is warm and dry.  Capillary Refill: Capillary refill takes less than 2 seconds.  Neurological:     General: No focal deficit present.     Mental Status: She is alert and oriented to person, place, and time. Mental status is at baseline.  Psychiatric:        Mood and Affect: Mood normal.        Behavior: Behavior normal.        Thought Content: Thought content normal.        Judgment: Judgment normal.    Results for orders placed or performed during the hospital encounter of 05/17/21  CBC WITH DIFFERENTIAL  Result Value Ref Range   WBC 7.1 4.0 - 10.5 K/uL   RBC 4.22 3.87 - 5.11 MIL/uL   Hemoglobin 13.1 12.0 - 15.0 g/dL   HCT 40.0 36.0 - 46.0 %   MCV 94.8 80.0 - 100.0 fL   MCH 31.0 26.0 - 34.0 pg   MCHC 32.8 30.0 - 36.0 g/dL   RDW 12.5 11.5 - 15.5 %   Platelets 196  150 - 400 K/uL   nRBC 0.0 0.0 - 0.2 %   Neutrophils Relative % 55 %   Neutro Abs 3.8 1.7 - 7.7 K/uL   Lymphocytes Relative 33 %   Lymphs Abs 2.3 0.7 - 4.0 K/uL   Monocytes Relative 8 %   Monocytes Absolute 0.5 0.1 - 1.0 K/uL   Eosinophils Relative 4 %   Eosinophils Absolute 0.3 0.0 - 0.5 K/uL   Basophils Relative 0 %   Basophils Absolute 0.0 0.0 - 0.1 K/uL   Immature Granulocytes 0 %   Abs Immature Granulocytes 0.03 0.00 - 0.07 K/uL  Basic metabolic panel  Result Value Ref Range   Sodium 137 135 - 145 mmol/L   Potassium 3.8 3.5 - 5.1 mmol/L   Chloride 100 98 - 111 mmol/L   CO2 25 22 - 32 mmol/L   Glucose, Bld 89 70 - 99 mg/dL   BUN 9 6 - 20 mg/dL   Creatinine, Ser 0.55 0.44 - 1.00 mg/dL   Calcium 9.0 8.9 - 10.3 mg/dL   GFR, Estimated >60 >60 mL/min   Anion gap 12 5 - 15  Protime-INR  Result Value Ref Range   Prothrombin Time 12.1 11.4 - 15.2 seconds   INR 0.9 0.8 - 1.2      Assessment & Plan:   Problem List Items Addressed This Visit   None Visit Diagnoses     Palpitations    -  Primary   EKG done in office today and showed NSR. Patient's symptoms did not improve with previous propranolol use. Will send to Cardiology for hoilter monitor.   Relevant Orders   EKG 12-Lead (Completed)   EKG 12-Lead (Completed)   Ambulatory referral to Cardiology        Follow up plan: Return if symptoms worsen or fail to improve.

## 2021-06-01 NOTE — Progress Notes (Signed)
No result attached. EKG repeated.

## 2021-06-01 NOTE — Progress Notes (Signed)
Results discussed with patient in office. NSR.

## 2021-06-03 ENCOUNTER — Other Ambulatory Visit: Payer: Self-pay | Admitting: Neurology

## 2021-06-03 DIAGNOSIS — M5481 Occipital neuralgia: Secondary | ICD-10-CM

## 2021-06-07 NOTE — Addendum Note (Signed)
Addended by: Valerie Roys on: 06/07/2021 08:13 AM   Modules accepted: Orders

## 2021-06-08 ENCOUNTER — Other Ambulatory Visit: Payer: Self-pay

## 2021-06-08 ENCOUNTER — Ambulatory Visit
Admission: RE | Admit: 2021-06-08 | Discharge: 2021-06-08 | Disposition: A | Discharge status: Home / Self Care | Payer: No Typology Code available for payment source | Source: Ambulatory Visit | Attending: Neurology | Admitting: Neurology

## 2021-06-08 DIAGNOSIS — M5481 Occipital neuralgia: Secondary | ICD-10-CM | POA: Insufficient documentation

## 2021-06-08 LAB — HM DIABETES EYE EXAM

## 2021-06-15 ENCOUNTER — Ambulatory Visit: Payer: No Typology Code available for payment source | Admitting: Dermatology

## 2021-06-27 ENCOUNTER — Encounter: Payer: Self-pay | Admitting: Family Medicine

## 2021-06-27 ENCOUNTER — Ambulatory Visit: Payer: No Typology Code available for payment source | Admitting: Family Medicine

## 2021-06-27 ENCOUNTER — Other Ambulatory Visit: Payer: Self-pay

## 2021-06-27 ENCOUNTER — Ambulatory Visit (INDEPENDENT_AMBULATORY_CARE_PROVIDER_SITE_OTHER): Payer: No Typology Code available for payment source | Admitting: Family Medicine

## 2021-06-27 VITALS — BP 138/84 | HR 78 | Temp 97.9°F | Wt 154.4 lb

## 2021-06-27 DIAGNOSIS — L600 Ingrowing nail: Secondary | ICD-10-CM

## 2021-06-27 NOTE — Progress Notes (Signed)
BP 138/84   Pulse 78   Temp 97.9 F (36.6 C) (Oral)   Wt 154 lb 6.4 oz (70 kg)   SpO2 100%   BMI 31.19 kg/m    Subjective:    Patient ID: Ashley Wiley, female    DOB: 03/14/1976, 45 y.o.   MRN: [id number]  HPI: Ashley Wiley is a 45 y.o. female  Chief Complaint  Patient presents with   Nail Problem    L toenail removal    TOE PAIN Duration: weeks Involved toe: L big toe  Mechanism of injury: none Onset: gradual Severity: moderate  Quality: aching and sore Frequency: constant Radiation: none Aggravating factors: walking  Alleviating factors: rest  Status: worse Treatments attempted: cutting the nail back  Relief with NSAIDs?: mild Morning stiffness: no Redness: no  Bruising: no Swelling: no Paresthesias / decreased sensation: no Fevers: no  Relevant past medical, surgical, family and social history reviewed and updated as indicated. Interim medical history since our last visit reviewed. Allergies and medications reviewed and updated.  Review of Systems  Constitutional: Negative.   Respiratory: Negative.    Cardiovascular: Negative.   Gastrointestinal: Negative.   Musculoskeletal: Negative.   Neurological: Negative.   Hematological: Negative.   Psychiatric/Behavioral: Negative.     Per HPI unless specifically indicated above     Objective:    BP 138/84   Pulse 78   Temp 97.9 F (36.6 C) (Oral)   Wt 154 lb 6.4 oz (70 kg)   SpO2 100%   BMI 31.19 kg/m   Wt Readings from Last 3 Encounters:  06/27/21 154 lb 6.4 oz (70 kg)  06/01/21 153 lb (69.4 kg)  05/17/21 155 lb (70.3 kg)    Physical Exam Vitals and nursing note reviewed.  Constitutional:      General: She is not in acute distress.    Appearance: Normal appearance. She is not ill-appearing, toxic-appearing or diaphoretic.  HENT:     Head: Normocephalic and atraumatic.     Right Ear: External ear normal.     Left Ear: External ear normal.     Nose: Nose normal.     Mouth/Throat:      Mouth: Mucous membranes are moist.     Pharynx: Oropharynx is clear.  Eyes:     General: No scleral icterus.       Right eye: No discharge.        Left eye: No discharge.     Extraocular Movements: Extraocular movements intact.     Conjunctiva/sclera: Conjunctivae normal.     Pupils: Pupils are equal, round, and reactive to light.  Cardiovascular:     Rate and Rhythm: Normal rate and regular rhythm.     Pulses: Normal pulses.     Heart sounds: Normal heart sounds. No murmur heard.   No friction rub. No gallop.  Pulmonary:     Effort: Pulmonary effort is normal. No respiratory distress.     Breath sounds: Normal breath sounds. No stridor. No wheezing, rhonchi or rales.  Chest:     Chest wall: No tenderness.  Musculoskeletal:        General: Normal range of motion.     Cervical back: Normal range of motion and neck supple.  Skin:    General: Skin is warm and dry.     Capillary Refill: Capillary refill takes less than 2 seconds.     Coloration: Skin is not jaundiced or pale.     Findings: No bruising, erythema, lesion  or rash.     Comments: Ingrown nail on medial border of L great toe  Neurological:     General: No focal deficit present.     Mental Status: She is alert and oriented to person, place, and time. Mental status is at baseline.  Psychiatric:        Mood and Affect: Mood normal.        Behavior: Behavior normal.        Thought Content: Thought content normal.        Judgment: Judgment normal.    Results for orders placed or performed in visit on 06/08/21  HM DIABETES EYE EXAM  Result Value Ref Range   HM Diabetic Eye Exam No Retinopathy No Retinopathy      Assessment & Plan:   Problem List Items Addressed This Visit   None Visit Diagnoses     Ingrown toenail of left foot    -  Primary   Removed today as below. Call with any concerns.        Procedure: Partial Toenail removal with Matrix Diagnosis:    ICD-10-CM   1. Ingrown toenail of left foot   L60.0    Removed today as below. Call with any concerns.      Physican: Park Liter, DO Consent: Risks, benefits, and alternative treatments discussed and all questions were answered.  Patient elected to proceed and verbal consent obtained Description:  Area prepped and draped using  sterile technique. Digital block of the  Left  1st toe performed by injecting local anesthetic at the base of the toe at the 2 oclock and 10 oclock positions, using 12 cc's of  1% lidocaine plain. After confirming adequate anesthesia, medial nail folds and epinychia were freed up using periosteal elevator.  Using scissors the nail was vertically cut beyond the epinychia to the base.  A hemostat was then used to remove the nail fragment. The nail was grasped using a hemostat and the nail was removed intact. Phenol was applied to the nail matrix x 3 using a cotton applicator tip.   Bacitracin ointment was applied to the operative site a circumferential gauze dressive was applied.  The patient tolerated the procedure well.  Complications: none Estimated Blood Loss: none Post Procedure Instructions: The patient was encouraged to keep the dressing in place for 24 hours and keep the foot elevated as much as possible during this time.  After the first day they are instructed to soak the toe in warm water 3 times daily for 3-4 days.  Antibiotic ointment is to be applied daily for 1 week.  The patient was informed that some oozing is to be expected for 1-2 weeks but that they should return immediately for pus, increased pain or redness.  They were instructed to take APAP or motrin as needed for post operative discomfort.     Follow up plan: Return if symptoms worsen or fail to improve.

## 2021-06-29 ENCOUNTER — Encounter: Payer: Self-pay | Admitting: Physical Therapy

## 2021-06-29 ENCOUNTER — Ambulatory Visit: Payer: No Typology Code available for payment source | Attending: Family Medicine | Admitting: Physical Therapy

## 2021-06-29 DIAGNOSIS — M542 Cervicalgia: Secondary | ICD-10-CM | POA: Insufficient documentation

## 2021-06-29 NOTE — Therapy (Signed)
[facility] [address], [place], [address] Phone: [phone]   Fax:  [phone]  Physical Therapy Evaluation  Patient Details  Name: Ashley Wiley MRN: [id number] Date of Birth: 03/14/1976 No data recorded  Encounter Date: 06/29/2021   PT End of Session - 06/29/21 1308     Visit Number 1    Number of Visits 17    Date for PT Re-Evaluation 08/26/21    PT Start Time [id number]    PT Stop Time 1308    PT Time Calculation (min) 38 min    Activity Tolerance Patient tolerated treatment well    Behavior During Therapy WFL for tasks assessed/performed             Past Medical History:  Diagnosis Date   Allergy    Aneurysm ([place])    Anxiety    Back pain    Constipation    Depression    Dermatofibroma    Diabetes mellitus without complication ([place])    Takes Metformin  dx 2016   Facial pain    Facial pain    GERD (gastroesophageal reflux disease)    Headache    Hyperlipidemia    IFG (impaired fasting glucose)    Low HDL (under 40)    Migraine    Obesity    Overweight    Patella-femoral syndrome    Pinguecula    Prediabetes    Thyroid nodule april 2016   Vitamin B12 deficiency    Vitamin D deficiency disease     Past Surgical History:  Procedure Laterality Date   CARPAL TUNNEL RELEASE     GANGLION CYST EXCISION Left    IR 3D INDEPENDENT WKST  12/04/2017   IR ANGIO INTRA EXTRACRAN SEL INTERNAL CAROTID BILAT MOD SED  12/04/2017   IR ANGIO INTRA EXTRACRAN SEL INTERNAL CAROTID BILAT MOD SED  05/17/2021   IR ANGIO INTRA EXTRACRAN SEL INTERNAL CAROTID UNI L MOD SED  03/05/2018   IR ANGIO INTRA EXTRACRAN SEL INTERNAL CAROTID UNI L MOD SED  05/02/2019   IR ANGIO VERTEBRAL SEL VERTEBRAL BILAT MOD SED  12/04/2017   IR ANGIO VERTEBRAL SEL VERTEBRAL UNI R MOD SED  05/17/2021   IR ANGIOGRAM FOLLOW UP STUDY  03/05/2018   IR ANGIOGRAM FOLLOW UP STUDY  03/05/2018   IR ANGIOGRAM FOLLOW UP STUDY  03/05/2018   IR  NEURO EACH ADD'L AFTER BASIC UNI LEFT (MS)  03/05/2018   IR TRANSCATH/EMBOLIZ  03/05/2018   IR US GUIDE VASC ACCESS RIGHT  05/17/2021   NASAL SINUS SURGERY  Nov 2015   RADIOLOGY WITH ANESTHESIA N/A 03/05/2018   Procedure: Arteriogram, coil embolization, possible stenting;  Surgeon: Consuella Lose, MD;  Location: [facility];  Service: Radiology;  Laterality: N/A;   TUBAL LIGATION      There were no vitals filed for this visit.   Subjective Assessment - 06/29/21 1236     Pertinent History Pt is a 45 year old female presenting with neck pain with tension headaches over the past few months, getting worse over the past few weeks. Pt also suffers from migraine headaches. 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 has R hand n/t that comes and goes at random. Her pain is aggravated  by paperwork >66mns, sweeping/mopping (looking down), emailing/texting. Pain is burning and tense in nature. Pain is minimally alleviated with ice. Was prescribed a muscle relaxer and reports this minimally helps. Worst pain 10/10; best 4/10; current 6/10. Pt is R handed. Pt denies N/V, B&B changes, unexplained weight fluctuation, saddle paresthesia, fever, night sweats, or unrelenting night pain at this time. No falls in past 6 months    Limitations Reading;Writing;Lifting;House hold activities    How long can you sit comfortably? unilmited    How long can you stand comfortably? unlimited    How long can you walk comfortably? unlimited    Diagnostic tests MRI neuralgia; DDD    Patient Stated Goals Decrease pain    Currently in Pain? Yes    Pain Score 6     Pain Location Neck    Pain Orientation Right    Pain Descriptors / Indicators Burning;Tightness    Pain Type Chronic pain;Neuropathic pain    Pain Radiating Towards RUE    Pain Onset More  than a month ago    Pain Frequency Intermittent    Aggravating Factors  looking down typing, writing, sweeping/vacuum    Pain Relieving Factors ice, pain medication    Effect of Pain on Daily Activities unable to complete work duties without pain    Multiple Pain Sites No              OBJECTIVE  Mental Status Patient is oriented to person, place and time.  Recent memory is intact.  Remote memory is intact.  Attention span and concentration are intact.  Expressive speech is intact.  Patient's fund of knowledge is within normal limits for educational level.  SENSATION: Grossly intact to light touch bilateral UE as determined by testing dermatomes C2-T2 Proprioception and hot/cold testing deferred on this date   MUSCULOSKELETAL: Tremor: None Bulk: Normal Tone: Normal  Posture  upper crossed forward head rounded shoulders  Palpation TTP to UT and pec minor with trigger points TTP with concordant pain sign to bilat subocciptals  Strength R/L 5/5 Shoulder flexion (anterior deltoid/pec major/coracobrachialis, axillary n. (C5/6) and musculocutaneous n. (C5-7)) 5/5 Shoulder abduction (deltoid/supraspinatus, axillary/suprascapular n, C5) 5/5 Shoulder external rotation (infraspinatus/teres minor) 5/5 Shoulder internal rotation (subcapularis/lats/pec major) 5/5 Shoulder extension (posterior deltoid, lats, teres major, axillary/thoracodorsal n.) 5/5 Shoulder horizontal abduction 5/5 Finger adduction (interossei, ulnar n, T1) Strong even grip strength Cervical isometrics are strong in all directions; with pain with protraction   AROM R/L WNL for all motions with concordant pain with looking down and L lateral bend; reports some pain relief with ext *Indicates pain, overpressure performed unless otherwise indicated  PROM R/L All WNL without pain   Repeated Movements centralization with upper retraction peripheralization with upper cervical protraction   Muscle  Length Upper Trap: shortend bilat Levator: WNL   Passive Accessory Intervertebral Motion (PAIVM) Pt denies reproduction of neck pain with CPA C2-T7 and UPA bilaterally C2-T7. Generally hypomobile throughout  Passive Physiological Intervertebral Motion (PPIVM) Normal flexion and extension with PPIVM testing   SPECIAL TESTS Spurlings A (ipsilateral lateral flexion/axial compression): R: Negative L: Negative Spurlings B (ipsilateral lateral flexion/contralateral rotation/axial compression): R: Negative L: Negative *with both spurlings stretch sensation felt at opposite UT Distraction Test: Positive  Hoffman Sign (cervical cord compression): R: Negative L: Negative ULTT Median: R: Positive L: Negative ULTT Ulnar: R: Negative L: Negative ULTT Radial: R: Negative L: Negative   Ther-Ex PT reviewed the following HEP with patient with patient able to demonstrate a set of  the following with min cuing for correction needed. PT educated patient on parameters of therex (how/when to inc/decrease intensity, frequency, rep/set range, stretch hold time, and purpose of therex) with verbalized understanding.   Seated Passive Cervical Retraction - 8 x daily - 7 x weekly - 12 reps - 2sec hold Seated Upper Trapezius Stretch - 3 x daily - 7 x weekly - 30sec hold Doorway Pec Stretch at 90 Degrees Abduction - 3 x daily - 7 x weekly - 30sec hold                                  PT Education - 06/29/21 1245     Education Details Patient was educated on diagnosis, anatomy and pathology involved, prognosis, role of PT, and was given an HEP, demonstrating exercise with proper form following verbal and tactile cues, and was given a paper hand out to continue exercise at home. Pt was educated on and agreed to plan of care.    Person(s) Educated Patient    Methods Explanation;Demonstration;Verbal cues;Tactile cues    Comprehension Verbalized understanding;Returned demonstration;Verbal  cues required;Tactile cues required              PT Short Term Goals - 06/29/21 1309       PT SHORT TERM GOAL #1   Title Pt will be independent with HEP in order to improve strength and decrease back pain in order to improve pain-free function at home and work.    Baseline 06/29/21 HEP given    Time 8    Period Weeks    Status New               PT Long Term Goals - 06/29/21 1310       PT LONG TERM GOAL #1   Title Patient will increase FOTO score to 57 to demonstrate predicted increase in functional mobility to complete ADLs    Baseline 06/29/21 42    Time 8    Period Weeks    Status New      PT LONG TERM GOAL #2   Title Pt will decrease worst neck pain as reported on NPRS by at least 2 points in order to demonstrate clinically significant reduction in back pain.    Baseline 06/29/21 10/10    Time 8    Period Weeks    Status New      PT LONG TERM GOAL #3   Title Pt will demonstrate 4+/5 bilat lower trap strength needed for heavy household ADLs    Baseline 06/29/21 3+/5 bilat    Time 8    Period Weeks    Status New                   Plan - 06/30/21 0818     Clinical Impression Statement Pt is a 45 year old female presenting with cervical pain with tension headaches for several months. Impairments of decreased DNF and periscapular strength, increased tension of accessory muscles and suboccipitals, abnormal posture, pain with headaches, decreased activity tolerance. Activity limitations in writing, using cellphone, driving, and typing;inhibiting full participation in her job as a CMA and ADLs. Pt will benefit from skilled PT to address aforementioned impairments to return to optimal PLOF.    Personal Factors and Comorbidities Comorbidity 3+;Fitness;Social Background;Past/Current Experience;Time since onset of injury/illness/exacerbation;Sex;Profession    Comorbidities A&D, HLD, migraines, DM2    Examination-Activity Limitations Lift;Carry;Sleep   typing,  writing   Examination-Participation Restrictions Driving;Community Activity;Occupation;Cleaning    Stability/Clinical Decision Making Evolving/Moderate complexity    Clinical Decision Making Moderate    Rehab Potential Good    PT Frequency 2x / week    PT Duration 8 weeks    PT Treatment/Interventions ADLs/Self Care Home Management;Therapeutic exercise;Passive range of motion;Electrical Stimulation;Ultrasound;Traction;Moist Heat;Cryotherapy;Iontophoresis '4mg'$ /ml Dexamethasone;DME Instruction;Therapeutic activities;Neuromuscular re-education;Patient/family education;Manual techniques;Dry needling;Spinal Manipulations;Joint Manipulations    PT Next Visit Plan TDN    PT Home Exercise Plan pec stretch, UT stretch, repeated retractions    Consulted and Agree with Plan of Care Patient             Patient will benefit from skilled therapeutic intervention in order to improve the following deficits and impairments:  Decreased activity tolerance, Decreased endurance, Decreased mobility, Decreased coordination, Increased muscle spasms, Decreased range of motion, Decreased strength, Increased fascial restricitons, Impaired UE functional use, Improper body mechanics, Pain, Postural dysfunction, Impaired flexibility, Impaired tone  Visit Diagnosis: Cervicalgia     Problem List Patient Active Problem List   Diagnosis Date Noted   Trigeminal neuralgia 12/13/2018   Type 2 diabetes mellitus with hyperglycemia, without long-term current use of insulin ([place]) 05/29/2018   Migraine without aura and without status migrainosus, not intractable 04/01/2018   Cerebral aneurysm, nonruptured 03/05/2018   Fibroid 02/08/2018   Menstrual migraine without status migrainosus, not intractable 02/08/2018   Menorrhagia with regular cycle 02/08/2018   Acute anxiety 01/13/2016   History of HPV infection 01/13/2016   Elevated ALT measurement 10/05/2015   Allergic rhinitis 07/13/2015   Thyroid nodule 07/13/2015    Obesity 05/14/2015   Hyperlipidemia    Patella-femoral syndrome    Vitamin D deficiency disease    Dermatofibroma    Depression    Pinguecula    Chronic pansinusitis 11/11/2014   Durwin Reges DPT Durwin Reges 06/30/2021, 9:11 AM  [facility] [address], [place], [address] Phone: [phone]   Fax:  [phone]  Name: Ashley Wiley MRN: [id number] Date of Birth: Apr 10, 1976

## 2021-07-01 ENCOUNTER — Other Ambulatory Visit: Payer: Self-pay

## 2021-07-01 ENCOUNTER — Encounter: Payer: Self-pay | Admitting: Physical Therapy

## 2021-07-01 ENCOUNTER — Ambulatory Visit: Payer: No Typology Code available for payment source | Admitting: Physical Therapy

## 2021-07-01 DIAGNOSIS — M542 Cervicalgia: Secondary | ICD-10-CM

## 2021-07-01 NOTE — Therapy (Signed)
[facility] [address] [place], [place], [address] Phone: [phone]   Fax:  [phone]  Physical Therapy Treatment  Patient Details  Name: Ashley Wiley MRN: [id number] Date of Birth: 1976-10-01 No data recorded  Encounter Date: 07/01/2021   PT End of Session - 07/01/21 0824     Visit Number 2    Number of Visits 17    Date for PT Re-Evaluation 08/26/21    PT Start Time 0800    PT Stop Time 0840    PT Time Calculation (min) 40 min    Activity Tolerance Patient tolerated treatment well    Behavior During Therapy WFL for tasks assessed/performed             Past Medical History:  Diagnosis Date   Allergy    Aneurysm ([place])    Anxiety    Back pain    Constipation    Depression    Dermatofibroma    Diabetes mellitus without complication ([place])    Takes Metformin  dx 2016   Facial pain    Facial pain    GERD (gastroesophageal reflux disease)    Headache    Hyperlipidemia    IFG (impaired fasting glucose)    Low HDL (under 40)    Migraine    Obesity    Overweight    Patella-femoral syndrome    Pinguecula    Prediabetes    Thyroid nodule april 2016   Vitamin B12 deficiency    Vitamin D deficiency disease     Past Surgical History:  Procedure Laterality Date   CARPAL TUNNEL RELEASE     GANGLION CYST EXCISION Left    IR 3D INDEPENDENT WKST  12/04/2017   IR ANGIO INTRA EXTRACRAN SEL INTERNAL CAROTID BILAT MOD SED  12/04/2017   IR ANGIO INTRA EXTRACRAN SEL INTERNAL CAROTID BILAT MOD SED  05/17/2021   IR ANGIO INTRA EXTRACRAN SEL INTERNAL CAROTID UNI L MOD SED  03/05/2018   IR ANGIO INTRA EXTRACRAN SEL INTERNAL CAROTID UNI L MOD SED  05/02/2019   IR ANGIO VERTEBRAL SEL VERTEBRAL BILAT MOD SED  12/04/2017   IR ANGIO VERTEBRAL SEL VERTEBRAL UNI R MOD SED  05/17/2021   IR ANGIOGRAM FOLLOW UP STUDY  03/05/2018   IR ANGIOGRAM FOLLOW UP STUDY  03/05/2018   IR ANGIOGRAM FOLLOW UP STUDY  03/05/2018   IR  NEURO EACH ADD'L AFTER BASIC UNI LEFT (MS)  03/05/2018   IR TRANSCATH/EMBOLIZ  03/05/2018   IR US GUIDE VASC ACCESS RIGHT  05/17/2021   NASAL SINUS SURGERY  Nov 2015   RADIOLOGY WITH ANESTHESIA N/A 03/05/2018   Procedure: Arteriogram, coil embolization, possible stenting;  Surgeon: Consuella Lose, MD;  Location: [facility];  Service: Radiology;  Laterality: N/A;   TUBAL LIGATION      There were no vitals filed for this visit.   Subjective Assessment - 07/01/21 0804     Subjective Pt reports R sided cervical pain this am 8/10 without headache. She has been doing her HEP and she can feel a good stretch with doorway and UT stretch. Having difficutly completing upper cervical retractions.    Pertinent History Pt is a 45 year old female presenting with neck pain with tension headaches over the past few months, getting worse over the past few weeks. Pt also suffers from migraine headaches. 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 Reports pain is usually R side of neck and shoulder, and will  give her a tension headache wrapped around the R side of her head, reporting HA feeling as though it is right behind the R eye. has R hand n/t that comes and goes at random. Her pain is aggravated by paperwork >7mns, sweeping/mopping (looking down), emailing/texting. Pain is burning and tense in nature. Pain is minimally alleviated with ice. Was prescribed a muscle relaxer and reports this minimally helps. Worst pain 10/10; best 4/10; current 6/10. Pt is R handed. Pt denies N/V, B&B changes, unexplained weight fluctuation, saddle paresthesia, fever, night sweats, or unrelenting night pain at this time. No falls in past 6 months    Limitations Reading;Writing;Lifting;House hold activities    How long can you sit comfortably? unilmited    How long can you stand comfortably? unlimited    How long can you walk comfortably? unlimited    Diagnostic tests MRI neuralgia; DDD    Patient Stated Goals Decrease pain    Pain Onset More  than a month ago               Manual STM with trigger point release to R subocciptals, bilat UT, R levator; following: Dry Needling: (2/2) 457m.25 needles placed along the R UT, and R subocciptals to decrease increased muscular spasms and trigger points with the patient positioned in prone. Patient was educated on risks and benefits of therapy and verbally consents to PT.  Cervical traction 10sec traction 10sec relax x6 Pain 3/10 following  Ther-Ex Supine chin tuck with small lift 10x 3sec hold good carry over of cuing, lengthening of suboccipitals Scapular squeezes into wall with cuing for cervical neutral set up with good carry over; same position with band pull aparts GTB 2x 10 Seated rows 15# 2x 10 with cuing for set up posture and eccentric control with good carry over UT stretch 30sec bilat min cuing to anchor opposite hand Doorway pec stretch 30sec min cuing for set up Review of repeated upper cervical flex + retraction x12                      PT Education - 07/01/21 0823     Education Details HEP review, TDN, therex form/technique    Person(s) Educated Patient    Methods Explanation;Demonstration;Verbal cues    Comprehension Verbalized understanding;Returned demonstration;Verbal cues required              PT Short Term Goals - 06/29/21 1309       PT SHORT TERM GOAL #1   Title Pt will be independent with HEP in order to improve strength and decrease back pain in order to improve pain-free function at home and work.    Baseline 06/29/21 HEP given    Time 8    Period Weeks    Status New               PT Long Term Goals - 06/29/21 1310       PT LONG TERM GOAL #1   Title Patient will increase FOTO score to 57 to demonstrate predicted increase in functional mobility to complete ADLs    Baseline 06/29/21 42    Time 8    Period Weeks    Status New      PT LONG TERM GOAL #2   Title Pt will decrease worst neck pain as reported on NPRS  by at least 2 points in order to demonstrate clinically significant reduction in back pain.    Baseline 06/29/21 10/10  Time 8    Period Weeks    Status New      PT LONG TERM GOAL #3   Title Pt will demonstrate 4+/5 bilat lower trap strength needed for heavy household ADLs    Baseline 06/29/21 3+/5 bilat    Time 8    Period Weeks    Status New                   Plan - 07/01/21 0831     Clinical Impression Statement PT utilized TDN with manual techniques to relieve muscle tension with pain reduction from 8/10 to 3/10. PT initiated therex progression for increased strengtheing of DNF and periscapular musculature, and lengthening of chest and subocciptal groups. Patient is able to comply with all cuing for proper technique of therex with good motivation throughout session. Cuing needed for neutral posture, and prevention of shoulder hiking/scapular elevation with scapular retraction.  PT will continue progression as able.    Personal Factors and Comorbidities Comorbidity 3+;Fitness;Social Background;Past/Current Experience;Time since onset of injury/illness/exacerbation;Sex;Profession    Comorbidities A&D, HLD, migraines, DM2    Examination-Activity Limitations Lift;Carry;Sleep    Examination-Participation Restrictions Driving;Community Activity;Occupation;Cleaning    Stability/Clinical Decision Making Evolving/Moderate complexity    Clinical Decision Making Moderate    Rehab Potential Good    PT Frequency 2x / week    PT Duration 8 weeks    PT Treatment/Interventions ADLs/Self Care Home Management;Therapeutic exercise;Passive range of motion;Electrical Stimulation;Ultrasound;Traction;Moist Heat;Cryotherapy;Iontophoresis '4mg'$ /ml Dexamethasone;DME Instruction;Therapeutic activities;Neuromuscular re-education;Patient/family education;Manual techniques;Dry needling;Spinal Manipulations;Joint Manipulations    PT Next Visit Plan TDN    PT Home Exercise Plan pec stretch, UT stretch,  repeated retractions    Consulted and Agree with Plan of Care Patient             Patient will benefit from skilled therapeutic intervention in order to improve the following deficits and impairments:  Decreased activity tolerance, Decreased endurance, Decreased mobility, Decreased coordination, Increased muscle spasms, Decreased range of motion, Decreased strength, Increased fascial restricitons, Impaired UE functional use, Improper body mechanics, Pain, Postural dysfunction, Impaired flexibility, Impaired tone  Visit Diagnosis: Cervicalgia     Problem List Patient Active Problem List   Diagnosis Date Noted   Trigeminal neuralgia 12/13/2018   Type 2 diabetes mellitus with hyperglycemia, without long-term current use of insulin ([place]) 05/29/2018   Migraine without aura and without status migrainosus, not intractable 04/01/2018   Cerebral aneurysm, nonruptured 03/05/2018   Fibroid 02/08/2018   Menstrual migraine without status migrainosus, not intractable 02/08/2018   Menorrhagia with regular cycle 02/08/2018   Acute anxiety 01/13/2016   History of HPV infection 01/13/2016   Elevated ALT measurement 10/05/2015   Allergic rhinitis 07/13/2015   Thyroid nodule 07/13/2015   Obesity 05/14/2015   Hyperlipidemia    Patella-femoral syndrome    Vitamin D deficiency disease    Dermatofibroma    Depression    Pinguecula    Chronic pansinusitis 11/11/2014   Durwin Reges DPT Durwin Reges 07/01/2021, 8:40 AM  [facility] [address], [place], [address] Phone: [phone]   Fax:  [phone]  Name: Ashley Wiley MRN: [id number] Date of Birth: 1976-11-02

## 2021-07-06 ENCOUNTER — Telehealth (INDEPENDENT_AMBULATORY_CARE_PROVIDER_SITE_OTHER): Payer: No Typology Code available for payment source | Admitting: Family Medicine

## 2021-07-06 ENCOUNTER — Encounter: Payer: Self-pay | Admitting: Physical Therapy

## 2021-07-06 ENCOUNTER — Encounter: Payer: No Typology Code available for payment source | Admitting: Physical Therapy

## 2021-07-06 ENCOUNTER — Other Ambulatory Visit: Payer: Self-pay

## 2021-07-06 ENCOUNTER — Ambulatory Visit: Payer: No Typology Code available for payment source | Admitting: Physical Therapy

## 2021-07-06 ENCOUNTER — Other Ambulatory Visit: Payer: Self-pay | Admitting: Dermatology

## 2021-07-06 ENCOUNTER — Encounter: Payer: Self-pay | Admitting: Family Medicine

## 2021-07-06 DIAGNOSIS — M542 Cervicalgia: Secondary | ICD-10-CM

## 2021-07-06 DIAGNOSIS — F419 Anxiety disorder, unspecified: Secondary | ICD-10-CM

## 2021-07-06 MED ORDER — VENLAFAXINE HCL ER 37.5 MG PO CP24
37.5000 mg | ORAL_CAPSULE | Freq: Every day | ORAL | 2 refills | Status: DC
  Filled 2021-07-06: qty 60, 60d supply, fill #0

## 2021-07-06 MED ORDER — TERBINAFINE HCL 250 MG PO TABS
250.0000 mg | ORAL_TABLET | Freq: Every day | ORAL | 0 refills | Status: DC
  Filled 2021-07-06: qty 30, 30d supply, fill #0

## 2021-07-06 MED ORDER — BUSPIRONE HCL 10 MG PO TABS
10.0000 mg | ORAL_TABLET | Freq: Three times a day (TID) | ORAL | 1 refills | Status: DC | PRN
  Filled 2021-07-06: qty 270, 60d supply, fill #0

## 2021-07-06 MED FILL — Azelaic Acid Foam 15%: CUTANEOUS | 30 days supply | Qty: 50 | Fill #1 | Status: AC

## 2021-07-06 NOTE — Therapy (Signed)
[facility] [address] [place], [place], [address] Phone: [phone]   Fax:  [phone]  Physical Therapy Treatment  Patient Details  Name: Ashley Wiley MRN: [id number] Date of Birth: [birth year] No data recorded  Encounter Date: 07/06/2021   PT End of Session - 07/06/21 1048     Visit Number 3    Number of Visits 17    Date for PT Re-Evaluation 08/26/21    PT Start Time 1040    PT Stop Time 1120    PT Time Calculation (min) 40 min    Activity Tolerance Patient tolerated treatment well    Behavior During Therapy WFL for tasks assessed/performed             Past Medical History:  Diagnosis Date   Allergy    Aneurysm ([place])    Anxiety    Back pain    Constipation    Depression    Dermatofibroma    Diabetes mellitus without complication ([place])    Takes Metformin  dx 2016   Facial pain    Facial pain    GERD (gastroesophageal reflux disease)    Headache    Hyperlipidemia    IFG (impaired fasting glucose)    Low HDL (under 40)    Migraine    Obesity    Overweight    Patella-femoral syndrome    Pinguecula    Prediabetes    Thyroid nodule april 2016   Vitamin B12 deficiency    Vitamin D deficiency disease     Past Surgical History:  Procedure Laterality Date   CARPAL TUNNEL RELEASE     GANGLION CYST EXCISION Left    IR 3D INDEPENDENT WKST  12/04/2017   IR ANGIO INTRA EXTRACRAN SEL INTERNAL CAROTID BILAT MOD SED  12/04/2017   IR ANGIO INTRA EXTRACRAN SEL INTERNAL CAROTID BILAT MOD SED  05/17/2021   IR ANGIO INTRA EXTRACRAN SEL INTERNAL CAROTID UNI L MOD SED  03/05/2018   IR ANGIO INTRA EXTRACRAN SEL INTERNAL CAROTID UNI L MOD SED  05/02/2019   IR ANGIO VERTEBRAL SEL VERTEBRAL BILAT MOD SED  12/04/2017   IR ANGIO VERTEBRAL SEL VERTEBRAL UNI R MOD SED  05/17/2021   IR ANGIOGRAM FOLLOW UP STUDY  03/05/2018   IR ANGIOGRAM FOLLOW UP STUDY  03/05/2018   IR ANGIOGRAM FOLLOW UP STUDY  03/05/2018   IR  NEURO EACH ADD'L AFTER BASIC UNI LEFT (MS)  03/05/2018   IR TRANSCATH/EMBOLIZ  03/05/2018   IR US GUIDE VASC ACCESS RIGHT  05/17/2021   NASAL SINUS SURGERY  Nov 2015   RADIOLOGY WITH ANESTHESIA N/A 03/05/2018   Procedure: Arteriogram, coil embolization, possible stenting;  Surgeon: Consuella Lose, MD;  Location: [facility];  Service: Radiology;  Laterality: N/A;   TUBAL LIGATION      There were no vitals filed for this visit.   Subjective Assessment - 07/06/21 1045     Subjective Pt reports she did very well following TDN and manual techniques. Her pain stayed low until Monday when she went to work and had a stressful day at work and had a lot of R sided neck pain and tension headache. She reports this pain was severe and made it difficult for her to sleep. When she woke up Tuesday her pain was 50% better. Reports today her pain is 5/10    Pertinent History Pt is a 45 year old female presenting with neck pain with tension headaches over the past  few months, getting worse over the past few weeks. Pt also suffers from migraine headaches. 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 has R hand n/t that comes and goes at random. Her pain is aggravated by paperwork >19mns, sweeping/mopping (looking down), emailing/texting. Pain is burning and tense in nature. Pain is minimally alleviated with ice. Was prescribed a muscle relaxer and reports this minimally helps. Worst pain 10/10; best 4/10; current 6/10. Pt is R handed. Pt denies N/V, B&B changes, unexplained weight fluctuation, saddle paresthesia, fever, night sweats, or unrelenting night pain at this time. No falls in past 6 months    Limitations Reading;Writing;Lifting;House hold activities    How long can you sit comfortably? unilmited    How long can you stand comfortably?  unlimited    How long can you walk comfortably? unlimited    Diagnostic tests MRI neuralgia; DDD    Patient Stated Goals Decrease pain    Pain Onset More than a month ago             Manual STM with trigger point release to R subocciptals, bilat UT, R levator; following: Dry Needling: (2/2) 467m.25 needles placed along the R UT, and R subocciptals to decrease increased muscular spasms and trigger points with the patient positioned in prone. Patient was educated on risks and benefits of therapy and verbally consents to PT.  Cervical traction 10sec traction 10sec relax x6 Pain 3/10 following   Ther-Ex Nustep seat 4 UE 5 L2 22m77mfor gentle protraction/retraction Diaphragmatic breathing ~1mi33mn each supine and seated using hand on chest and belly to feel diaphragmatic movement without accessory muscle use with decent carry over. Education typical breathing with stress response, and its impact on accessory muscle tension Low rows (scap retraction+ depression) GTB 2x 10 with max cuing initially for technique without shoulder hiking with good carry over (added to HEP) Y on wall x12 for setting with good carry over of technique; with 2# DB x10                              PT Education - 07/06/21 1047     Education Details TDN, therex form/technique    Person(s) Educated Patient    Methods Explanation;Demonstration;Verbal cues    Comprehension Verbalized understanding;Returned demonstration;Verbal cues required              PT Short Term Goals - 06/29/21 1309       PT SHORT TERM GOAL #1   Title Pt will be independent with HEP in order to improve strength and decrease back pain in order to improve pain-free function at home and work.    Baseline 06/29/21 HEP given    Time 8    Period Weeks    Status New               PT Long Term Goals - 06/29/21 1310       PT LONG TERM GOAL #1   Title Patient will increase FOTO score to 57 to demonstrate  predicted increase in functional mobility to complete ADLs    Baseline 06/29/21 42    Time 8    Period Weeks    Status New  PT LONG TERM GOAL #2   Title Pt will decrease worst neck pain as reported on NPRS by at least 2 points in order to demonstrate clinically significant reduction in back pain.    Baseline 06/29/21 10/10    Time 8    Period Weeks    Status New      PT LONG TERM GOAL #3   Title Pt will demonstrate 4+/5 bilat lower trap strength needed for heavy household ADLs    Baseline 06/29/21 3+/5 bilat    Time 8    Period Weeks    Status New                   Plan - 07/06/21 1114     Clinical Impression Statement PT continued TDN with manual techniques to relieve muscle tension with good pain reduction from 5/10 to 4/10 following. Patient with localized twitch response to TDN bilat levator scapulae and R UT. PT spent time educating patient on diaphragmatic breathing, and the correlation of emotional stress and accessory muscle tension. Pt is taking a holistic approach to pain management, with PCP helping her manage her stress with medication as well, which is excellent. PT continued therex progression for lower trap strengthening with multimodal cuing needed to prevent shoulder hiking/scapular elevation. Patient is able to comply with cuing, and is motivated throughout session. PT will continue progression as able.    Personal Factors and Comorbidities Comorbidity 3+;Fitness;Social Background;Past/Current Experience;Time since onset of injury/illness/exacerbation;Sex;Profession    Comorbidities A&D, HLD, migraines, DM2    Examination-Activity Limitations Lift;Carry;Sleep    Examination-Participation Restrictions Driving;Community Activity;Occupation;Cleaning    Stability/Clinical Decision Making Evolving/Moderate complexity    Clinical Decision Making Moderate    Rehab Potential Good    PT Frequency 2x / week    PT Duration 8 weeks    PT Treatment/Interventions  ADLs/Self Care Home Management;Therapeutic exercise;Passive range of motion;Electrical Stimulation;Ultrasound;Traction;Moist Heat;Cryotherapy;Iontophoresis '4mg'$ /ml Dexamethasone;DME Instruction;Therapeutic activities;Neuromuscular re-education;Patient/family education;Manual techniques;Dry needling;Spinal Manipulations;Joint Manipulations    PT Next Visit Plan continue POC    PT Home Exercise Plan pec stretch, UT stretch, repeated retractions    Consulted and Agree with Plan of Care Patient             Patient will benefit from skilled therapeutic intervention in order to improve the following deficits and impairments:  Decreased activity tolerance, Decreased endurance, Decreased mobility, Decreased coordination, Increased muscle spasms, Decreased range of motion, Decreased strength, Increased fascial restricitons, Impaired UE functional use, Improper body mechanics, Pain, Postural dysfunction, Impaired flexibility, Impaired tone  Visit Diagnosis: Cervicalgia     Problem List Patient Active Problem List   Diagnosis Date Noted   Trigeminal neuralgia 12/13/2018   Type 2 diabetes mellitus with hyperglycemia, without long-term current use of insulin ([facility]) 05/29/2018   Migraine without aura and without status migrainosus, not intractable 04/01/2018   Cerebral aneurysm, nonruptured 03/05/2018   Fibroid 02/08/2018   Menstrual migraine without status migrainosus, not intractable 02/08/2018   Menorrhagia with regular cycle 02/08/2018   Acute anxiety 01/13/2016   History of HPV infection 01/13/2016   Elevated ALT measurement 10/05/2015   Allergic rhinitis 07/13/2015   Thyroid nodule 07/13/2015   Obesity 05/14/2015   Hyperlipidemia    Patella-femoral syndrome    Vitamin D deficiency disease    Dermatofibroma    Depression    Pinguecula    Chronic pansinusitis 11/11/2014   Durwin Reges DPT Durwin Reges 07/06/2021, 11:25 AM  [facility]  [address], [place], [address] Phone: [phone]   Fax:  [phone]  Name: Ashley Wiley MRN: [id number] Date of Birth: [birth year]

## 2021-07-06 NOTE — Progress Notes (Signed)
BP 127/82   Pulse 69    Subjective:    Patient ID: Ashley Wiley, female    DOB: Oct 26, 1976, 45 y.o.   MRN: [id number]  HPI: [name] is a 45 y.o. female  Chief Complaint  Patient presents with   Anxiety   ANXIETY/STRESS Duration: chronic- worse in the past few weeks Status: exacerbated Anxious mood: yes  Excessive worrying: yes Irritability: yes  Sweating: no Nausea: yes Palpitations:yes Hyperventilation: no Panic attacks: no Agoraphobia: no  Obscessions/compulsions: yes Depressed mood: no Depression screen [facility] 2/9 07/06/2021 01/06/2021 09/24/2020 07/11/2020 03/30/2020  Decreased Interest 0 0 1 2 0  Down, Depressed, Hopeless '1 1 1 1 '$ 0  PHQ - 2 Score '1 1 2 3 '$ 0  Altered sleeping 1 0 1 3 0  Tired, decreased energy 0 0 '3 3 1  '$ Change in appetite 0 0 1 0 0  Feeling bad or failure about yourself  0 1 1 0 0  Trouble concentrating '1 1 1 '$ 0 0  Moving slowly or fidgety/restless 0 0 1 1 0  Suicidal thoughts 0 0 0 0 0  PHQ-9 Score '3 3 10 10 1  '$ Difficult doing work/chores Somewhat difficult Not difficult at all Not difficult at all Somewhat difficult Not difficult at all  Some recent data might be hidden   Anhedonia: no Weight changes: no Insomnia: no   Hypersomnia: no Fatigue/loss of energy: no Feelings of worthlessness: no Feelings of guilt: no Impaired concentration/indecisiveness: no Suicidal ideations: no  Crying spells: no Recent Stressors/Life Changes: yes   Relationship problems: no   Family stress: no     Financial stress: yes    Job stress: yes    Recent death/loss: no  Relevant past medical, surgical, family and social history reviewed and updated as indicated. Interim medical history since our last visit reviewed. Allergies and medications reviewed and updated.  Review of Systems  Constitutional: Negative.   Respiratory: Negative.    Cardiovascular: Negative.   Gastrointestinal: Negative.   Musculoskeletal: Negative.    Psychiatric/Behavioral:  Negative for agitation, behavioral problems, confusion, decreased concentration, dysphoric mood, hallucinations, self-injury, sleep disturbance and suicidal ideas. The patient is nervous/anxious. The patient is not hyperactive.    Per HPI unless specifically indicated above     Objective:    BP 127/82   Pulse 69   Wt Readings from Last 3 Encounters:  06/27/21 154 lb 6.4 oz (70 kg)  06/01/21 153 lb (69.4 kg)  05/17/21 155 lb (70.3 kg)    Physical Exam Vitals and nursing note reviewed.  Constitutional:      General: She is not in acute distress.    Appearance: Normal appearance. She is not ill-appearing, toxic-appearing or diaphoretic.  HENT:     Head: Normocephalic and atraumatic.     Right Ear: External ear normal.     Left Ear: External ear normal.     Nose: Nose normal.     Mouth/Throat:     Mouth: Mucous membranes are moist.     Pharynx: Oropharynx is clear.  Eyes:     General: No scleral icterus.       Right eye: No discharge.        Left eye: No discharge.     Conjunctiva/sclera: Conjunctivae normal.     Pupils: Pupils are equal, round, and reactive to light.  Pulmonary:     Effort: Pulmonary effort is normal. No respiratory distress.     Comments: Speaking in full sentences Musculoskeletal:  General: Normal range of motion.     Cervical back: Normal range of motion.  Skin:    Coloration: Skin is not jaundiced or pale.     Findings: No bruising, erythema, lesion or rash.  Neurological:     Mental Status: She is alert and oriented to person, place, and time. Mental status is at baseline.  Psychiatric:        Mood and Affect: Mood normal.        Behavior: Behavior normal.        Thought Content: Thought content normal.        Judgment: Judgment normal.    Results for orders placed or performed in visit on 06/08/21  HM DIABETES EYE EXAM  Result Value Ref Range   HM Diabetic Eye Exam No Retinopathy No Retinopathy       Assessment & Plan:   Problem List Items Addressed This Visit       Other   Acute anxiety    In exacerbation. Will restart effexor and increase buspar to decrease pill burden. Call with any concerns. Recheck 4-6 weeks.       Relevant Medications   venlafaxine XR (EFFEXOR XR) 37.5 MG 24 hr capsule   busPIRone (BUSPAR) 10 MG tablet     Follow up plan: Return 4-6 weeks, physical.   This visit was completed via video visit through MyChart due to the restrictions of the COVID-19 pandemic. All issues as above were discussed and addressed. Physical exam was done as above through visual confirmation on video through MyChart. If it was felt that the patient should be evaluated in the office, they were directed there. The patient verbally consented to this visit. Location of the patient: home Location of the provider: work Those involved with this call:  Provider: Park Liter, DO CMA: Frazier Butt, CMA Front Desk/Registration: Barth Kirks  Time spent on call:  15 minutes with patient face to face via video conference. More than 50% of this time was spent in counseling and coordination of care. 23 minutes total spent in review of patient's record and preparation of their chart.

## 2021-07-06 NOTE — Assessment & Plan Note (Signed)
In exacerbation. Will restart effexor and increase buspar to decrease pill burden. Call with any concerns. Recheck 4-6 weeks.

## 2021-07-07 ENCOUNTER — Other Ambulatory Visit: Payer: Self-pay

## 2021-07-08 ENCOUNTER — Other Ambulatory Visit: Payer: Self-pay

## 2021-07-08 ENCOUNTER — Encounter: Payer: Self-pay | Admitting: Physical Therapy

## 2021-07-08 ENCOUNTER — Ambulatory Visit: Payer: No Typology Code available for payment source | Admitting: Physical Therapy

## 2021-07-08 DIAGNOSIS — M542 Cervicalgia: Secondary | ICD-10-CM

## 2021-07-08 NOTE — Therapy (Signed)
[facility] [address] [place], [place], [address] Phone: [phone]   Fax:  [phone]  Physical Therapy Treatment  Patient Details  Name: Ashley Wiley MRN: [id number] Date of Birth: [birth year] No data recorded  Encounter Date: 07/08/2021   PT End of Session - 07/08/21 0815     Visit Number 4    Number of Visits 17    Date for PT Re-Evaluation 08/26/21    PT Start Time 0808    PT Stop Time 0846    PT Time Calculation (min) 38 min    Activity Tolerance Patient tolerated treatment well    Behavior During Therapy WFL for tasks assessed/performed             Past Medical History:  Diagnosis Date   Allergy    Aneurysm ([place])    Anxiety    Back pain    Constipation    Depression    Dermatofibroma    Diabetes mellitus without complication ([place])    Takes Metformin  dx 2016   Facial pain    Facial pain    GERD (gastroesophageal reflux disease)    Headache    Hyperlipidemia    IFG (impaired fasting glucose)    Low HDL (under 40)    Migraine    Obesity    Overweight    Patella-femoral syndrome    Pinguecula    Prediabetes    Thyroid nodule april 2016   Vitamin B12 deficiency    Vitamin D deficiency disease     Past Surgical History:  Procedure Laterality Date   CARPAL TUNNEL RELEASE     GANGLION CYST EXCISION Left    IR 3D INDEPENDENT WKST  12/04/2017   IR ANGIO INTRA EXTRACRAN SEL INTERNAL CAROTID BILAT MOD SED  12/04/2017   IR ANGIO INTRA EXTRACRAN SEL INTERNAL CAROTID BILAT MOD SED  05/17/2021   IR ANGIO INTRA EXTRACRAN SEL INTERNAL CAROTID UNI L MOD SED  03/05/2018   IR ANGIO INTRA EXTRACRAN SEL INTERNAL CAROTID UNI L MOD SED  05/02/2019   IR ANGIO VERTEBRAL SEL VERTEBRAL BILAT MOD SED  12/04/2017   IR ANGIO VERTEBRAL SEL VERTEBRAL UNI R MOD SED  05/17/2021   IR ANGIOGRAM FOLLOW UP STUDY  03/05/2018   IR ANGIOGRAM FOLLOW UP STUDY  03/05/2018   IR ANGIOGRAM FOLLOW UP STUDY  03/05/2018   IR  NEURO EACH ADD'L AFTER BASIC UNI LEFT (MS)  03/05/2018   IR TRANSCATH/EMBOLIZ  03/05/2018   IR US GUIDE VASC ACCESS RIGHT  05/17/2021   NASAL SINUS SURGERY  Nov 2015   RADIOLOGY WITH ANESTHESIA N/A 03/05/2018   Procedure: Arteriogram, coil embolization, possible stenting;  Surgeon: Consuella Lose, MD;  Location: [facility];  Service: Radiology;  Laterality: N/A;   TUBAL LIGATION      There were no vitals filed for this visit.   Subjective Assessment - 07/08/21 0811     Subjective She felt very good yesterday, until she had to do paper work last night, which made her much more sore and she reports 8/10 pain this morning from this. She did try to take breaks doing her paperwork, but there was a lot to do. She did stretch after but was not mindful of her posture while completing paperwork- was at home so did not have the best ergonomic set up. Reports she is happy with how well she did at work being mindful of her breathing and posture without pain.  Pertinent History Pt is a 45 year old female presenting with neck pain with tension headaches over the past few months, getting worse over the past few weeks. Pt also suffers from migraine headaches. 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 has R hand n/t that comes and goes at random. Her pain is aggravated by paperwork >9mns, sweeping/mopping (looking down), emailing/texting. Pain is burning and tense in nature. Pain is minimally alleviated with ice. Was prescribed a muscle relaxer and reports this minimally helps. Worst pain 10/10; best 4/10; current 6/10. Pt is R handed. Pt denies N/V, B&B changes, unexplained weight fluctuation, saddle paresthesia, fever, night sweats, or unrelenting night pain at this time. No falls in past 6 months    Limitations  Reading;Writing;Lifting;House hold activities    How long can you sit comfortably? unilmited    How long can you stand comfortably? unlimited    How long can you walk comfortably? unlimited    Diagnostic tests MRI neuralgia; DDD    Patient Stated Goals Decrease pain    Pain Onset More than a month ago             Manual STM with trigger point release to R subocciptals, bilat UT, R levator; following: Dry Needling: (2/2) 457m.25 needles placed along the R UT, and R subocciptals to decrease increased muscular spasms and trigger points with the patient positioned in prone. Patient was educated on risks and benefits of therapy and verbally consents to PT.  Cervical traction 10sec traction 10sec relax x6 Pain 5/10 following   Ther-Ex Nustep seat 4 UE 5 L2 34m64mfor gentle protraction/retraction Prone Y x10; with 1# DB in each hand cuing iniitally for scapular motion with good carry over Seated rows 15# 3x 10 with good carry over of cuing for scapular depression                             PT Education - 07/08/21 0814     Education Details therex form/technique    Person(s) Educated Patient    Methods Demonstration;Explanation;Verbal cues    Comprehension Verbal cues required;Returned demonstration;Verbalized understanding              PT Short Term Goals - 06/29/21 1309       PT SHORT TERM GOAL #1   Title Pt will be independent with HEP in order to improve strength and decrease back pain in order to improve pain-free function at home and work.    Baseline 06/29/21 HEP given    Time 8    Period Weeks    Status New               PT Long Term Goals - 06/29/21 1310       PT LONG TERM GOAL #1   Title Patient will increase FOTO score to 57 to demonstrate predicted increase in functional mobility to complete ADLs    Baseline 06/29/21 42    Time 8    Period Weeks    Status New      PT LONG TERM GOAL #2   Title Pt will decrease worst neck pain  as reported on NPRS by at least 2 points in order to demonstrate clinically significant  reduction in back pain.    Baseline 06/29/21 10/10    Time 8    Period Weeks    Status New      PT LONG TERM GOAL #3   Title Pt will demonstrate 4+/5 bilat lower trap strength needed for heavy household ADLs    Baseline 06/29/21 3+/5 bilat    Time 8    Period Weeks    Status New                   Plan - 07/08/21 0841     Clinical Impression Statement PT continued to utilize TDN with manual techniques with localized twitch response, decreased tension, and decreased pain to 5/10. PT continued therex progression for strengthening of scapular depression + retraction and neutral posture with success. Patient is able to comply with all cuing for proper techniques with good motivation throughout session. PT will continue progression as able.    Personal Factors and Comorbidities Comorbidity 3+;Fitness;Social Background;Past/Current Experience;Time since onset of injury/illness/exacerbation;Sex;Profession    Comorbidities A&D, HLD, migraines, DM2    Examination-Activity Limitations Lift;Carry;Sleep    Examination-Participation Restrictions Driving;Community Activity;Occupation;Cleaning    Stability/Clinical Decision Making Evolving/Moderate complexity    Clinical Decision Making Moderate    Rehab Potential Good    PT Frequency 2x / week    PT Duration 8 weeks    PT Treatment/Interventions ADLs/Self Care Home Management;Therapeutic exercise;Passive range of motion;Electrical Stimulation;Ultrasound;Traction;Moist Heat;Cryotherapy;Iontophoresis '4mg'$ /ml Dexamethasone;DME Instruction;Therapeutic activities;Neuromuscular re-education;Patient/family education;Manual techniques;Dry needling;Spinal Manipulations;Joint Manipulations    PT Next Visit Plan continue POC    PT Home Exercise Plan pec stretch, UT stretch, repeated retractions             Patient will benefit from skilled therapeutic  intervention in order to improve the following deficits and impairments:  Decreased activity tolerance, Decreased endurance, Decreased mobility, Decreased coordination, Increased muscle spasms, Decreased range of motion, Decreased strength, Increased fascial restricitons, Impaired UE functional use, Improper body mechanics, Pain, Postural dysfunction, Impaired flexibility, Impaired tone  Visit Diagnosis: Cervicalgia     Problem List Patient Active Problem List   Diagnosis Date Noted   Trigeminal neuralgia 12/13/2018   Type 2 diabetes mellitus with hyperglycemia, without long-term current use of insulin ([place]) 05/29/2018   Migraine without aura and without status migrainosus, not intractable 04/01/2018   Cerebral aneurysm, nonruptured 03/05/2018   Fibroid 02/08/2018   Menstrual migraine without status migrainosus, not intractable 02/08/2018   Menorrhagia with regular cycle 02/08/2018   Acute anxiety 01/13/2016   History of HPV infection 01/13/2016   Elevated ALT measurement 10/05/2015   Allergic rhinitis 07/13/2015   Thyroid nodule 07/13/2015   Obesity 05/14/2015   Hyperlipidemia    Patella-femoral syndrome    Vitamin D deficiency disease    Dermatofibroma    Depression    Pinguecula    Chronic pansinusitis 11/11/2014   Durwin Reges DPT Durwin Reges 07/08/2021, 8:55 AM  [facility] [address], [place], [address] Phone: [phone]   Fax:  [phone]  Name: Ashley Wiley MRN: [id number] Date of Birth: [birth year]

## 2021-07-13 ENCOUNTER — Encounter: Payer: No Typology Code available for payment source | Admitting: Family Medicine

## 2021-07-13 ENCOUNTER — Ambulatory Visit: Payer: No Typology Code available for payment source | Admitting: Physical Therapy

## 2021-07-13 ENCOUNTER — Encounter: Payer: Self-pay | Admitting: Physical Therapy

## 2021-07-13 DIAGNOSIS — M542 Cervicalgia: Secondary | ICD-10-CM | POA: Diagnosis not present

## 2021-07-13 NOTE — Therapy (Signed)
[facility] [address] [place], [place], [address] Phone: [phone]   Fax:  [phone]  Physical Therapy Treatment  Patient Details  Name: ANUREET PINELA MRN: [id number] Date of Birth: 04/01/1976 No data recorded  Encounter Date: 07/13/2021   PT End of Session - 07/13/21 0906     Visit Number 5    Number of Visits 17    Date for PT Re-Evaluation 08/26/21    PT Start Time 0901    PT Stop Time 0940    PT Time Calculation (min) 39 min    Activity Tolerance Patient tolerated treatment well    Behavior During Therapy [facility] for tasks assessed/performed             Past Medical History:  Diagnosis Date   Allergy    Aneurysm ([place])    Anxiety    Back pain    Constipation    Depression    Dermatofibroma    Diabetes mellitus without complication ([place])    Takes Metformin  dx 2016   Facial pain    Facial pain    GERD (gastroesophageal reflux disease)    Headache    Hyperlipidemia    IFG (impaired fasting glucose)    Low HDL (under 40)    Migraine    Obesity    Overweight    Patella-femoral syndrome    Pinguecula    Prediabetes    Thyroid nodule april 2016   Vitamin B12 deficiency    Vitamin D deficiency disease     Past Surgical History:  Procedure Laterality Date   CARPAL TUNNEL RELEASE     GANGLION CYST EXCISION Left    IR 3D INDEPENDENT WKST  12/04/2017   IR ANGIO INTRA EXTRACRAN SEL INTERNAL CAROTID BILAT MOD SED  12/04/2017   IR ANGIO INTRA EXTRACRAN SEL INTERNAL CAROTID BILAT MOD SED  05/17/2021   IR ANGIO INTRA EXTRACRAN SEL INTERNAL CAROTID UNI L MOD SED  03/05/2018   IR ANGIO INTRA EXTRACRAN SEL INTERNAL CAROTID UNI L MOD SED  05/02/2019   IR ANGIO VERTEBRAL SEL VERTEBRAL BILAT MOD SED  12/04/2017   IR ANGIO VERTEBRAL SEL VERTEBRAL UNI R MOD SED  05/17/2021   IR ANGIOGRAM FOLLOW UP STUDY  03/05/2018   IR ANGIOGRAM FOLLOW UP STUDY  03/05/2018   IR ANGIOGRAM FOLLOW UP STUDY  03/05/2018   IR  NEURO EACH ADD'L AFTER BASIC UNI LEFT (MS)  03/05/2018   IR TRANSCATH/EMBOLIZ  03/05/2018   IR US GUIDE VASC ACCESS RIGHT  05/17/2021   NASAL SINUS SURGERY  Nov 2015   RADIOLOGY WITH ANESTHESIA N/A 03/05/2018   Procedure: Arteriogram, coil embolization, possible stenting;  Surgeon: Consuella Lose, MD;  Location: [facility];  Service: Radiology;  Laterality: N/A;   TUBAL LIGATION      There were no vitals filed for this visit.   Subjective Assessment - 07/13/21 0903     Subjective Patient reports she has felt great since leaving PT last week, but did wake up with pain this morning at 3am. She was able to go back to sleep with naproxen. Reports this pain is 7/10 this morning with continued headaches. Patient reports she has been really been focusing on her posture at work, and taking breaks, and this has been really helping.    Pertinent History Pt is a 45 year old female presenting with neck pain with tension headaches over the past few months, getting worse over the past few  weeks. Pt also suffers from migraine headaches. 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 has R hand n/t that comes and goes at random. Her pain is aggravated by paperwork >47mns, sweeping/mopping (looking down), emailing/texting. Pain is burning and tense in nature. Pain is minimally alleviated with ice. Was prescribed a muscle relaxer and reports this minimally helps. Worst pain 10/10; best 4/10; current 6/10. Pt is R handed. Pt denies N/V, B&B changes, unexplained weight fluctuation, saddle paresthesia, fever, night sweats, or unrelenting night pain at this time. No falls in past 6 months    Limitations Reading;Writing;Lifting;House hold activities    How long can you sit comfortably? unilmited    How long can you stand comfortably? unlimited    How  long can you walk comfortably? unlimited    Diagnostic tests MRI neuralgia; DDD    Patient Stated Goals Decrease pain    Pain Onset More than a month ago             Manual STM with trigger point release to R subocciptals, bilat UT, R levator, R temporalis; following: Dry Needling: (1/1) 461m.25 needles placed along the R UT and R levator to decrease increased muscular spasms and trigger points with the patient positioned in prone. Patient was educated on risks and benefits of therapy and verbally consents to PT.  Decreased tension to bilat levator and L UT- also R UT but still with minor trigger points Pain 3/10 following   Ther-Ex Nustep seat 4 UE 5 L2 20m21mfor gentle protraction/retraction Seated rows 15# 3x 10 with good carry over of cuing for scapular depression  Unilateral farmers carry 10# DB x100 ft bilat with min cuing for technique with good carry over; with 20# KB with increased need for cuing for neutral posture  Bent over row 15# x10; row with stand and lower x6                          PT Education - 07/13/21 0906     Education Details therex form/technique    Person(s) Educated Patient    Methods Explanation;Demonstration;Verbal cues    Comprehension Verbalized understanding;Verbal cues required;Returned demonstration              PT Short Term Goals - 06/29/21 1309       PT SHORT TERM GOAL #1   Title Pt will be independent with HEP in order to improve strength and decrease back pain in order to improve pain-free function at home and work.    Baseline 06/29/21 HEP given    Time 8    Period Weeks    Status New               PT Long Term Goals - 06/29/21 1310       PT LONG TERM GOAL #1   Title Patient will increase FOTO score to 57 to demonstrate predicted increase in functional mobility to complete ADLs    Baseline 06/29/21 42    Time 8    Period Weeks    Status New      PT LONG TERM GOAL #2   Title Pt will decrease  worst neck pain as reported on NPRS by at least 2 points in  order to demonstrate clinically significant reduction in back pain.    Baseline 06/29/21 10/10    Time 8    Period Weeks    Status New      PT LONG TERM GOAL #3   Title Pt will demonstrate 4+/5 bilat lower trap strength needed for heavy household ADLs    Baseline 06/29/21 3+/5 bilat    Time 8    Period Weeks    Status New                   Plan - 07/13/21 0932     Clinical Impression Statement Pt reports going days without pain which is an improvement. The headaches patient continues to report are more migraine > tension headache from subjective symptoms, which PT also encouraged neurology input, which patient is seeking. Patient with noted improvement in tension and trigger points in bilat levator and L UT. Continued tension in R UT, and R suboccipital group, but still much less than previously. Pt is able to demonstrate good carry over of proper technique of therex with application to lifting and carrying for household ADLs. Patient is motivated throughout session with pain reduction to 3/10. PT wil lcontinue progression as able.    Personal Factors and Comorbidities Comorbidity 3+;Fitness;Social Background;Past/Current Experience;Time since onset of injury/illness/exacerbation;Sex;Profession;Age    Comorbidities A&D, HLD, migraines, DM2    Examination-Activity Limitations Lift;Carry;Sleep    Examination-Participation Restrictions Driving;Community Activity;Occupation;Cleaning    Stability/Clinical Decision Making Evolving/Moderate complexity    Clinical Decision Making Moderate    Rehab Potential Good    PT Frequency 2x / week    PT Duration 8 weeks    PT Treatment/Interventions ADLs/Self Care Home Management;Therapeutic exercise;Passive range of motion;Electrical Stimulation;Ultrasound;Traction;Moist Heat;Cryotherapy;Iontophoresis '4mg'$ /ml Dexamethasone;DME Instruction;Therapeutic activities;Neuromuscular  re-education;Patient/family education;Manual techniques;Dry needling;Spinal Manipulations;Joint Manipulations    PT Next Visit Plan continue POC    PT Home Exercise Plan pec stretch, UT stretch, repeated retractions    Consulted and Agree with Plan of Care Patient             Patient will benefit from skilled therapeutic intervention in order to improve the following deficits and impairments:  Decreased activity tolerance, Decreased endurance, Decreased mobility, Decreased coordination, Increased muscle spasms, Decreased range of motion, Decreased strength, Increased fascial restricitons, Impaired UE functional use, Improper body mechanics, Pain, Postural dysfunction, Impaired flexibility, Impaired tone  Visit Diagnosis: Cervicalgia     Problem List Patient Active Problem List   Diagnosis Date Noted   Trigeminal neuralgia 12/13/2018   Type 2 diabetes mellitus with hyperglycemia, without long-term current use of insulin ([facility]) 05/29/2018   Migraine without aura and without status migrainosus, not intractable 04/01/2018   Cerebral aneurysm, nonruptured 03/05/2018   Fibroid 02/08/2018   Menstrual migraine without status migrainosus, not intractable 02/08/2018   Menorrhagia with regular cycle 02/08/2018   Acute anxiety 01/13/2016   History of HPV infection 01/13/2016   Elevated ALT measurement 10/05/2015   Allergic rhinitis 07/13/2015   Thyroid nodule 07/13/2015   Obesity 05/14/2015   Hyperlipidemia    Patella-femoral syndrome    Vitamin D deficiency disease    Dermatofibroma    Depression    Pinguecula    Chronic pansinusitis 11/11/2014   Durwin Reges DPT Durwin Reges 07/13/2021, 11:04 AM  [facility] [address] [place], [address] Phone: [phone]   Fax:  [phone]  Name: MELIZZA AMONETT MRN: [id number] Date of Birth: 04/17/1976

## 2021-07-15 ENCOUNTER — Other Ambulatory Visit: Payer: Self-pay

## 2021-07-15 ENCOUNTER — Encounter: Payer: Self-pay | Admitting: Physical Therapy

## 2021-07-15 ENCOUNTER — Ambulatory Visit: Payer: No Typology Code available for payment source | Admitting: Physical Therapy

## 2021-07-15 DIAGNOSIS — M542 Cervicalgia: Secondary | ICD-10-CM | POA: Diagnosis not present

## 2021-07-15 NOTE — Therapy (Signed)
[facility] [address], [place], [address] Phone: [phone]   Fax:  [phone]  Physical Therapy Treatment  Patient Details  Name: Ashley Wiley MRN: [id number] Date of Birth: 28-Jul-1976 No data recorded  Encounter Date: 07/15/2021   PT End of Session - 07/15/21 0809     Visit Number 6    Number of Visits 17    Date for PT Re-Evaluation 08/26/21    PT Start Time 0803    PT Stop Time [id number]    PT Time Calculation (min) 39 min    Activity Tolerance Patient tolerated treatment well    Behavior During Therapy [facility] for tasks assessed/performed             Past Medical History:  Diagnosis Date   Allergy    Aneurysm ([place])    Anxiety    Back pain    Constipation    Depression    Dermatofibroma    Diabetes mellitus without complication ([place])    Takes Metformin  dx 2016   Facial pain    Facial pain    GERD (gastroesophageal reflux disease)    Headache    Hyperlipidemia    IFG (impaired fasting glucose)    Low HDL (under 40)    Migraine    Obesity    Overweight    Patella-femoral syndrome    Pinguecula    Prediabetes    Thyroid nodule april 2016   Vitamin B12 deficiency    Vitamin D deficiency disease     Past Surgical History:  Procedure Laterality Date   CARPAL TUNNEL RELEASE     GANGLION CYST EXCISION Left    IR 3D INDEPENDENT WKST  12/04/2017   IR ANGIO INTRA EXTRACRAN SEL INTERNAL CAROTID BILAT MOD SED  12/04/2017   IR ANGIO INTRA EXTRACRAN SEL INTERNAL CAROTID BILAT MOD SED  05/17/2021   IR ANGIO INTRA EXTRACRAN SEL INTERNAL CAROTID UNI L MOD SED  03/05/2018   IR ANGIO INTRA EXTRACRAN SEL INTERNAL CAROTID UNI L MOD SED  05/02/2019   IR ANGIO VERTEBRAL SEL VERTEBRAL BILAT MOD SED  12/04/2017   IR ANGIO VERTEBRAL SEL VERTEBRAL UNI R MOD SED  05/17/2021   IR ANGIOGRAM FOLLOW UP STUDY  03/05/2018   IR ANGIOGRAM FOLLOW UP STUDY  03/05/2018   IR ANGIOGRAM FOLLOW UP STUDY  03/05/2018   IR  NEURO EACH ADD'L AFTER BASIC UNI LEFT (MS)  03/05/2018   IR TRANSCATH/EMBOLIZ  03/05/2018   IR US GUIDE VASC ACCESS RIGHT  05/17/2021   NASAL SINUS SURGERY  Nov 2015   RADIOLOGY WITH ANESTHESIA N/A 03/05/2018   Procedure: Arteriogram, coil embolization, possible stenting;  Surgeon: Consuella Lose, MD;  Location: [facility];  Service: Radiology;  Laterality: N/A;   TUBAL LIGATION      There were no vitals filed for this visit.   Subjective Assessment - 07/15/21 0806     Subjective Pt reports she has felt better since last PT session. Reports 5/10 R sided neck pain today. She is working on neutral posture, and taking breaks at work which she thinks is helping.    Pertinent History Pt is a 45 year old female presenting with neck pain with tension headaches over the past few months, getting worse over the past few weeks. Pt also suffers from migraine headaches. 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 Reports pain is usually R side of neck and shoulder, and will give her  a tension headache wrapped around the R side of her head, reporting HA feeling as though it is right behind the R eye. has R hand n/t that comes and goes at random. Her pain is aggravated by paperwork >40mns, sweeping/mopping (looking down), emailing/texting. Pain is burning and tense in nature. Pain is minimally alleviated with ice. Was prescribed a muscle relaxer and reports this minimally helps. Worst pain 10/10; best 4/10; current 6/10. Pt is R handed. Pt denies N/V, B&B changes, unexplained weight fluctuation, saddle paresthesia, fever, night sweats, or unrelenting night pain at this time. No falls in past 6 months    Limitations Reading;Writing;Lifting;House hold activities    How long can you sit comfortably? unilmited    How long can you stand comfortably? unlimited    How long can you walk comfortably? unlimited    Diagnostic tests MRI neuralgia; DDD    Patient Stated Goals Decrease pain    Pain Onset More than a month ago                 Manual STM with trigger point release to R subocciptals, bilat UT, R levator following: Dry Needling: (1/1) 468m.25 needles placed along the R UT and R levator to decrease increased muscular spasms and trigger points with the patient positioned in prone. Patient was educated on risks and benefits of therapy and verbally consents to PT.  Decreased tension to bilat levator and L UT- marked increase in R levator and UT tension today   Ther-Ex Nustep seat 4 UE 5 L2 82m90mfor gentle protraction/retraction Low rows BluTB 3x 10 with min cuing for initial set up with good carry over following Bent over row 15# 3 x10 with good carry over of set up cuing Bent over flys 2# DB 3x 10                          PT Education - 07/15/21 0809     Education Details therex form/technique    Person(s) Educated Patient    Methods Explanation;Demonstration;Verbal cues    Comprehension Verbalized understanding;Returned demonstration;Verbal cues required              PT Short Term Goals - 06/29/21 1309       PT SHORT TERM GOAL #1   Title Pt will be independent with HEP in order to improve strength and decrease back pain in order to improve pain-free function at home and work.    Baseline 06/29/21 HEP given    Time 8    Period Weeks    Status New               PT Long Term Goals - 06/29/21 1310       PT LONG TERM GOAL #1   Title Patient will increase FOTO score to 57 to demonstrate predicted increase in functional mobility to complete ADLs    Baseline 06/29/21 42    Time 8    Period Weeks    Status New      PT LONG TERM GOAL #2   Title Pt will decrease worst neck pain as reported on NPRS by at least 2 points in order to demonstrate clinically significant reduction in back pain.    Baseline 06/29/21 10/10    Time 8    Period Weeks    Status New      PT LONG TERM GOAL #3   Title Pt will demonstrate 4+/5 bilat lower trap  strength needed for heavy  household ADLs    Baseline 06/29/21 3+/5 bilat    Time 8    Period Weeks    Status New                   Plan - 07/15/21 0829     Clinical Impression Statement Pt continues to respond very well to manual techniques with TDN with 3/10 pain following. Marked increase in muscle tension of R levator and UT following a day at work > last session. PT continued therex progression for increased scapular depression and retraction with success. Patient is able to comply with all cuing for proper technique of therex with good motivation throughout session. PT will continue progression as able.    Personal Factors and Comorbidities Comorbidity 3+;Fitness;Social Background;Past/Current Experience;Time since onset of injury/illness/exacerbation;Sex;Profession;Age    Comorbidities A&D, HLD, migraines, DM2    Examination-Activity Limitations Lift;Carry;Sleep    Examination-Participation Restrictions Driving;Community Activity;Occupation;Cleaning    Stability/Clinical Decision Making Evolving/Moderate complexity    Clinical Decision Making Moderate    Rehab Potential Good    PT Frequency 2x / week    PT Duration 8 weeks    PT Treatment/Interventions ADLs/Self Care Home Management;Therapeutic exercise;Passive range of motion;Electrical Stimulation;Ultrasound;Traction;Moist Heat;Cryotherapy;Iontophoresis '4mg'$ /ml Dexamethasone;DME Instruction;Therapeutic activities;Neuromuscular re-education;Patient/family education;Manual techniques;Dry needling;Spinal Manipulations;Joint Manipulations    PT Next Visit Plan continue POC    PT Home Exercise Plan pec stretch, UT stretch, repeated retractions    Consulted and Agree with Plan of Care Patient             Patient will benefit from skilled therapeutic intervention in order to improve the following deficits and impairments:  Decreased activity tolerance, Decreased endurance, Decreased mobility, Decreased coordination, Increased muscle spasms, Decreased  range of motion, Decreased strength, Increased fascial restricitons, Impaired UE functional use, Improper body mechanics, Pain, Postural dysfunction, Impaired flexibility, Impaired tone  Visit Diagnosis: Cervicalgia     Problem List Patient Active Problem List   Diagnosis Date Noted   Trigeminal neuralgia 12/13/2018   Type 2 diabetes mellitus with hyperglycemia, without long-term current use of insulin ([place]) 05/29/2018   Migraine without aura and without status migrainosus, not intractable 04/01/2018   Cerebral aneurysm, nonruptured 03/05/2018   Fibroid 02/08/2018   Menstrual migraine without status migrainosus, not intractable 02/08/2018   Menorrhagia with regular cycle 02/08/2018   Acute anxiety 01/13/2016   History of HPV infection 01/13/2016   Elevated ALT measurement 10/05/2015   Allergic rhinitis 07/13/2015   Thyroid nodule 07/13/2015   Obesity 05/14/2015   Hyperlipidemia    Patella-femoral syndrome    Vitamin D deficiency disease    Dermatofibroma    Depression    Pinguecula    Chronic pansinusitis 11/11/2014   Durwin Reges DPT Durwin Reges 07/15/2021, 8:45 AM  [facility] [address] [place], [address] Phone: [phone]   Fax:  [phone]  Name: ROSHAWN HANNI MRN: [id number] Date of Birth: [birth year]

## 2021-07-19 ENCOUNTER — Other Ambulatory Visit: Payer: Self-pay

## 2021-07-19 ENCOUNTER — Encounter: Payer: No Typology Code available for payment source | Admitting: Family Medicine

## 2021-07-20 ENCOUNTER — Encounter: Payer: Self-pay | Admitting: Physical Therapy

## 2021-07-20 ENCOUNTER — Other Ambulatory Visit: Payer: Self-pay

## 2021-07-20 ENCOUNTER — Ambulatory Visit: Payer: No Typology Code available for payment source | Admitting: Physical Therapy

## 2021-07-20 ENCOUNTER — Ambulatory Visit: Payer: No Typology Code available for payment source | Admitting: Dermatology

## 2021-07-20 DIAGNOSIS — B351 Tinea unguium: Secondary | ICD-10-CM

## 2021-07-20 DIAGNOSIS — M542 Cervicalgia: Secondary | ICD-10-CM

## 2021-07-20 DIAGNOSIS — L301 Dyshidrosis [pompholyx]: Secondary | ICD-10-CM | POA: Diagnosis not present

## 2021-07-20 DIAGNOSIS — L811 Chloasma: Secondary | ICD-10-CM | POA: Diagnosis not present

## 2021-07-20 MED ORDER — DICLOFENAC POTASSIUM 50 MG PO TABS
ORAL_TABLET | ORAL | 0 refills | Status: DC
  Filled 2021-07-20: qty 60, 30d supply, fill #0

## 2021-07-20 MED ORDER — TRIAMCINOLONE ACETONIDE 0.1 % EX CREA
TOPICAL_CREAM | CUTANEOUS | 1 refills | Status: DC
  Filled 2021-07-20: qty 45, 30d supply, fill #0

## 2021-07-20 NOTE — Progress Notes (Signed)
Follow-Up Visit   Subjective  Ashley Wiley is a 45 y.o. female who presents for the following: Melasma (Face, about the same as last visit but no worse. She is using Finacea foam BID, Tranexamic acid '650mg'$  1/2 po BID, Heliocare QAM, Skin Medicincals without tretinoin QAM, and Elta MD sunscreen.), Tinea Unguium (Left great toenail, mild improvement. She is taking terbinafine '250mg'$  and on 2nd month. She is using Kerydin solution.), and Small blisters (Hands x a few weeks, itchy. Hx ). No side effects from the TXA or the terbinafine.   The following portions of the chart were reviewed this encounter and updated as appropriate:       Review of Systems:  No other skin or systemic complaints except as noted in HPI or Assessment and Plan.  Objective  Well appearing patient in no apparent distress; mood and affect are within normal limits.  A focused examination was performed including face, hands, foot, toenail. Relevant physical exam findings are noted in the Assessment and Plan.  B/L zygoma, upper lip, upper eyebrows Reticulated hyperpigmented patches.  No change when compared to previous photos, no worsening.   L great toenail Toenail dystrophy with discoloration, thickening, about the same when photo compared 100% involvement  Left Mid Palm Tiny microvesicles on the palms.   Assessment & Plan  Melasma B/L zygoma, upper lip, upper eyebrows  Melasma is a chronic condition of persistent pigmented patches generally on the face, worse in summer due to higher UV exposure.  Oral estrogen containing BCPs or supplements can exacerbate condition.  Recommend daily broad spectrum tinted sunscreen SPF 30+ to face, preferably with Zinc or Titanium Dioxide. Discussed Rx topical bleaching creams (i.e. hydroquinone), OTC HelioCare supplement, chemical peels (would need multiple for best result).   Continue Finacea foam apply 2 times daily Continue Tranexamic acid '650mg'$  1/2 po BID Continue  Heliocare 1-2 tabs PO AM and mid day during spring/summer Start Skin Medicinals Hydroquinone 8%, Tretinoin 0.1%, Kojic acid 1%, Niacinamide 4%, Fluocinolone 0.025% cream, a pea sized amount nightly to dark spots on face for up to 2 months. This cannot be used more than 3 months due to risk of skin atrophy (thinning) and exogenous ochronosis (permanent dark spots). Prescription has been sent in but patient never had filled.  Recommend break from Hydroquinone over wintertime  If unable to tolerate, will restart Hydroquinone: 12%, Kojic Acid: 6%, Niacinamide: 2%, Vitamin C: 1% Cream, pt has.    Related Medications tranexamic acid (LYSTEDA) 650 MG TABS tablet TAKE 1/2 TABLET BY MOUTH TWICE A DAY FOR MELASMA  Azelaic Acid 15 % FOAM APPLY TO FACE EVERY MORNING AND WASH OFF EVERY NIGHT AT BEDTIME  Tinea unguium L great toenail  No improvement but still early in course  Continue terbinafine '250mg'$  take 1 po QD (2nd month) Pending labs, will send in terbinafine 1 po QD dsp #30 1Rf (total of 4 months since total nail involvement.  Great toenails can take up to a year to grow out. Continue Kerydin qd  Hepatic Function Panel - L great toenail  Related Medications Tavaborole (KERYDIN) 5 % SOLN Apply to affected toenail nightly  Dyshidrotic hand dermatitis Left Mid Palm  Start TMC 0.1% Cream Apply to AA hands qd/bid until improved. Avoid face, groin, axilla.   Hand Dermatitis is a chronic type of eczema that can come and go on the hands and fingers.  While there is no cure, the rash and symptoms can be managed with topical prescription medications, and for  more severe cases, with systemic medications.  Recommend mild soap and routine use of moisturizing cream after handwashing.  Minimize soap/water exposure when possible.     triamcinolone cream (KENALOG) 0.1 % - Left Mid Palm Apply to affected areas hands 1-2 times a day until improved.  Return in about 2 months (around 09/19/2021) for  melasma, tinea.  IJamesetta Orleans, CMA, am acting as scribe for Brendolyn Patty, MD . Documentation: I have reviewed the above documentation for accuracy and completeness, and I agree with the above.  Brendolyn Patty MD

## 2021-07-20 NOTE — Patient Instructions (Signed)
Terbinafine Counseling  Terbinafine is an anti-fungal medicine that can be applied to the skin (over the counter) or taken by mouth (prescription) to treat fungal infections. The pill version is often used to treat fungal infections of the nails or scalp. While most people do not have any side effects from taking terbinafine pills, some possible side effects of the medicine can include taste changes, headache, loss of smell, vision changes, nausea, vomiting, or diarrhea.   Rare side effects can include irritation of the liver, allergic reaction, or decrease in blood counts (which may show up as not feeling well or developing an infection). If you are concerned about any of these side effects, please stop the medicine and call your doctor, or in the case of an emergency such as feeling very unwell, seek immediate medical care.   Melasma is a chronic condition of persistent pigmented patches generally on the face, worse in summer due to higher UV exposure.  Oral estrogen containing BCPs or supplements can exacerbate condition.  Recommend daily broad spectrum tinted sunscreen SPF 30+ to face, preferably with Zinc or Titanium Dioxide. Discussed Rx topical bleaching creams (i.e. hydroquinone), OTC HelioCare supplement, chemical peels (would need multiple for best result).     If you have any questions or concerns for your doctor, please call our main line at [phone] and press option 4 to reach your doctor's medical assistant. If no one answers, please leave a voicemail as directed and we will return your call as soon as possible. Messages left after 4 pm will be answered the following business day.   You may also send [place] a message via [place]. We typically respond to MyChart messages within 1-2 business days.  For prescription refills, please ask your pharmacy to contact our office. Our fax number is [phone].  If you have an urgent issue when the clinic is closed that cannot wait until the next  business day, you can page your doctor at the number below.    Please note that while we do our best to be available for urgent issues outside of office hours, we are not available 24/7.   If you have an urgent issue and are unable to reach [place], you may choose to seek medical care at your doctor's office, retail clinic, urgent care center, or emergency room.  If you have a medical emergency, please immediately call 911 or go to the emergency department.  Pager Numbers  - Dr. Nehemiah Massed: [phone]  - Dr. Laurence Ferrari: [phone]  - Dr. Nicole Kindred: [phone]  In the event of inclement weather, please call our main line at [phone] for an update on the status of any delays or closures.  Dermatology Medication Tips: Please keep the boxes that topical medications come in in order to help keep track of the instructions about where and how to use these. Pharmacies typically print the medication instructions only on the boxes and not directly on the medication tubes.   If your medication is too expensive, please contact our office at [phone] option 4 or send [place] a message through [place].   We are unable to tell what your co-pay for medications will be in advance as this is different depending on your insurance coverage. However, we may be able to find a substitute medication at lower cost or fill out paperwork to get insurance to cover a needed medication.   If a prior authorization is required to get your medication covered by your insurance company, please allow [place] 1-2 business days  to complete this process.  Drug prices often vary depending on where the prescription is filled and some pharmacies may offer cheaper prices.  The website www.goodrx.com contains coupons for medications through different pharmacies. The prices here do not account for what the cost may be with help from insurance (it may be cheaper with your insurance), but the website can give you the price if you did not use  any insurance.  - You can print the associated coupon and take it with your prescription to the pharmacy.  - You may also stop by our office during regular business hours and pick up a GoodRx coupon card.  - If you need your prescription sent electronically to a different pharmacy, notify our office through [facility] or by phone at [phone] option 4.

## 2021-07-20 NOTE — Therapy (Signed)
[facility] [address], [place], [address] Phone: [phone]   Fax:  [phone]  Physical Therapy Treatment  Patient Details  Name: Ashley Wiley MRN: [id number] Date of Birth: 05-08-1976 No data recorded  Encounter Date: 07/20/2021   PT End of Session - 07/20/21 1518     Visit Number 7    Number of Visits 17    Date for PT Re-Evaluation 08/26/21    PT Start Time 1440    PT Stop Time 1518    PT Time Calculation (min) 38 min    Activity Tolerance Patient tolerated treatment well    Behavior During Therapy WFL for tasks assessed/performed             Past Medical History:  Diagnosis Date   Allergy    Aneurysm ([place])    Anxiety    Back pain    Constipation    Depression    Dermatofibroma    Diabetes mellitus without complication ([place])    Takes Metformin  dx 2016   Facial pain    Facial pain    GERD (gastroesophageal reflux disease)    Headache    Hyperlipidemia    IFG (impaired fasting glucose)    Low HDL (under 40)    Migraine    Obesity    Overweight    Patella-femoral syndrome    Pinguecula    Prediabetes    Thyroid nodule april 2016   Vitamin B12 deficiency    Vitamin D deficiency disease     Past Surgical History:  Procedure Laterality Date   CARPAL TUNNEL RELEASE     GANGLION CYST EXCISION Left    IR 3D INDEPENDENT WKST  12/04/2017   IR ANGIO INTRA EXTRACRAN SEL INTERNAL CAROTID BILAT MOD SED  12/04/2017   IR ANGIO INTRA EXTRACRAN SEL INTERNAL CAROTID BILAT MOD SED  05/17/2021   IR ANGIO INTRA EXTRACRAN SEL INTERNAL CAROTID UNI L MOD SED  03/05/2018   IR ANGIO INTRA EXTRACRAN SEL INTERNAL CAROTID UNI L MOD SED  05/02/2019   IR ANGIO VERTEBRAL SEL VERTEBRAL BILAT MOD SED  12/04/2017   IR ANGIO VERTEBRAL SEL VERTEBRAL UNI R MOD SED  05/17/2021   IR ANGIOGRAM FOLLOW UP STUDY  03/05/2018   IR ANGIOGRAM FOLLOW UP STUDY  03/05/2018   IR ANGIOGRAM FOLLOW UP STUDY  03/05/2018   IR  NEURO EACH ADD'L AFTER BASIC UNI LEFT (MS)  03/05/2018   IR TRANSCATH/EMBOLIZ  03/05/2018   IR US GUIDE VASC ACCESS RIGHT  05/17/2021   NASAL SINUS SURGERY  Nov 2015   RADIOLOGY WITH ANESTHESIA N/A 03/05/2018   Procedure: Arteriogram, coil embolization, possible stenting;  Surgeon: Consuella Lose, MD;  Location: [facility];  Service: Radiology;  Laterality: N/A;   TUBAL LIGATION      There were no vitals filed for this visit.   Subjective Assessment - 07/20/21 1441     Subjective Pt reports she has felt better since last PT session but her pain was was really bad yesterday. Reports 2/10 R sided neck pain today. She is working on neutral posture, and taking breaks at work which she thinks is helping.    Pertinent History Pt is a 45 year old female presenting with neck pain with tension headaches over the past few months, getting worse over the past few weeks. Pt also suffers from migraine headaches. 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 Reports pain is usually R side  of neck and shoulder, and will give her a tension headache wrapped around the R side of her head, reporting HA feeling as though it is right behind the R eye. has R hand n/t that comes and goes at random. Her pain is aggravated by paperwork >69mns, sweeping/mopping (looking down), emailing/texting. Pain is burning and tense in nature. Pain is minimally alleviated with ice. Was prescribed a muscle relaxer and reports this minimally helps. Worst pain 10/10; best 4/10; current 6/10. Pt is R handed. Pt denies N/V, B&B changes, unexplained weight fluctuation, saddle paresthesia, fever, night sweats, or unrelenting night pain at this time. No falls in past 6 months    Limitations Reading;Writing;Lifting;House hold activities    How long can you sit comfortably? unilmited    How long can you stand comfortably? unlimited    How long can you walk comfortably? unlimited    Diagnostic tests MRI neuralgia; DDD    Patient Stated Goals Decrease pain               Ther-Ex  Nustep seat 4 UE 5 L2 552m for gentle protraction/retraction  Straight Arm ER 2 x 10 reps  GTB emphaisis on scapular retraction   Posterior delt row 3 x 10 reps RTB emphasis on scap retraction  Chin tucks 1 x 12 reps   Deep neck flexor isometric hold 2 x 30sec   Omega Row 2 x 10 reps #15 emphasis on scapular retraction   Manual therapy  Upper trapezius stretch 2 x 30sec Cervical Extensor stretch 3 x 30sec Cervical Side bending/Rotation 3 x 30 sec  Sub-occipital release + Cervical traction                            PT Education - 07/20/21 1535     Education Details therex form/technique    Person(s) Educated Patient    Methods Explanation;Demonstration    Comprehension Verbalized understanding;Returned demonstration;Verbal cues required              PT Short Term Goals - 06/29/21 1309       PT SHORT TERM GOAL #1   Title Pt will be independent with HEP in order to improve strength and decrease back pain in order to improve pain-free function at home and work.    Baseline 06/29/21 HEP given    Time 8    Period Weeks    Status New               PT Long Term Goals - 06/29/21 1310       PT LONG TERM GOAL #1   Title Patient will increase FOTO score to 57 to demonstrate predicted increase in functional mobility to complete ADLs    Baseline 06/29/21 42    Time 8    Period Weeks    Status New      PT LONG TERM GOAL #2   Title Pt will decrease worst neck pain as reported on NPRS by at least 2 points in order to demonstrate clinically significant reduction in back pain.    Baseline 06/29/21 10/10    Time 8    Period Weeks    Status New      PT LONG TERM GOAL #3   Title Pt will demonstrate 4+/5 bilat lower trap strength needed for heavy household ADLs    Baseline 06/29/21 3+/5 bilat    Time 8    Period Weeks    Status New  Plan - 07/20/21 1520     Clinical Impression Statement Pt  tolerated session well with no increase in pain following manual therapy and exercise. PT continued periscapular strengthening and deep neck stabilization exercises this session. Pt demonstrated good carry over with PT cueing and demonstration. Continue PT POC.    Personal Factors and Comorbidities Comorbidity 3+;Fitness;Social Background;Past/Current Experience;Time since onset of injury/illness/exacerbation;Sex;Profession;Age    Comorbidities A&D, HLD, migraines, DM2    Examination-Activity Limitations Lift;Carry;Sleep    Examination-Participation Restrictions Driving;Community Activity;Occupation;Cleaning    Stability/Clinical Decision Making Evolving/Moderate complexity    Clinical Decision Making Moderate    Rehab Potential Good    PT Frequency 2x / week    PT Duration 8 weeks    PT Treatment/Interventions ADLs/Self Care Home Management;Therapeutic exercise;Passive range of motion;Electrical Stimulation;Ultrasound;Traction;Moist Heat;Cryotherapy;Iontophoresis '4mg'$ /ml Dexamethasone;DME Instruction;Therapeutic activities;Neuromuscular re-education;Patient/family education;Manual techniques;Dry needling;Spinal Manipulations;Joint Manipulations    PT Next Visit Plan continue POC    PT Home Exercise Plan pec stretch, UT stretch, repeated retractions    Consulted and Agree with Plan of Care Patient             Patient will benefit from skilled therapeutic intervention in order to improve the following deficits and impairments:  Decreased activity tolerance, Decreased endurance, Decreased mobility, Decreased coordination, Increased muscle spasms, Decreased range of motion, Decreased strength, Increased fascial restricitons, Impaired UE functional use, Improper body mechanics, Pain, Postural dysfunction, Impaired flexibility, Impaired tone  Visit Diagnosis: Cervicalgia     Problem List Patient Active Problem List   Diagnosis Date Noted   Trigeminal neuralgia 12/13/2018   Type 2 diabetes  mellitus with hyperglycemia, without long-term current use of insulin ([place]) 05/29/2018   Migraine without aura and without status migrainosus, not intractable 04/01/2018   Cerebral aneurysm, nonruptured 03/05/2018   Fibroid 02/08/2018   Menstrual migraine without status migrainosus, not intractable 02/08/2018   Menorrhagia with regular cycle 02/08/2018   Acute anxiety 01/13/2016   History of HPV infection 01/13/2016   Elevated ALT measurement 10/05/2015   Allergic rhinitis 07/13/2015   Thyroid nodule 07/13/2015   Obesity 05/14/2015   Hyperlipidemia    Patella-femoral syndrome    Vitamin D deficiency disease    Dermatofibroma    Depression    Pinguecula    Chronic pansinusitis 11/11/2014      Durwin Reges DPT Sharion Settler, SPT  Durwin Reges 07/20/2021, 4:14 PM  [facility] [address], [place], [address] Phone: [phone]   Fax:  [phone]  Name: Ashley Wiley MRN: [id number] Date of Birth: 1976/04/20

## 2021-07-21 LAB — HEPATIC FUNCTION PANEL
ALT: 17 IU/L (ref 0–32)
AST: 23 IU/L (ref 0–40)
Albumin: 4.6 g/dL (ref 3.8–4.8)
Alkaline Phosphatase: 84 IU/L (ref 44–121)
Bilirubin Total: 0.6 mg/dL (ref 0.0–1.2)
Bilirubin, Direct: 0.14 mg/dL (ref 0.00–0.40)
Total Protein: 6.9 g/dL (ref 6.0–8.5)

## 2021-07-22 ENCOUNTER — Encounter: Payer: No Typology Code available for payment source | Admitting: Physical Therapy

## 2021-07-26 ENCOUNTER — Other Ambulatory Visit: Payer: Self-pay

## 2021-07-26 ENCOUNTER — Telehealth: Payer: Self-pay

## 2021-07-26 MED ORDER — TERBINAFINE HCL 250 MG PO TABS
250.0000 mg | ORAL_TABLET | Freq: Every day | ORAL | 1 refills | Status: DC
  Filled 2021-07-26: qty 30, 30d supply, fill #0

## 2021-07-26 NOTE — Telephone Encounter (Signed)
Left message advising patient labs are normal and 2 Rf of terbinafine sent to [facility].

## 2021-07-26 NOTE — Telephone Encounter (Signed)
-----   Message from Brendolyn Patty, MD sent at 07/26/2021  8:33 AM EDT ----- Liver function tests are normal, send in 2 rfs terbinafine - please call patient

## 2021-07-27 ENCOUNTER — Ambulatory Visit (INDEPENDENT_AMBULATORY_CARE_PROVIDER_SITE_OTHER): Payer: No Typology Code available for payment source | Admitting: Internal Medicine

## 2021-07-27 ENCOUNTER — Encounter: Payer: No Typology Code available for payment source | Admitting: Family Medicine

## 2021-07-27 ENCOUNTER — Ambulatory Visit (INDEPENDENT_AMBULATORY_CARE_PROVIDER_SITE_OTHER): Payer: No Typology Code available for payment source

## 2021-07-27 ENCOUNTER — Other Ambulatory Visit: Payer: Self-pay

## 2021-07-27 ENCOUNTER — Ambulatory Visit: Payer: No Typology Code available for payment source | Attending: Family Medicine | Admitting: Physical Therapy

## 2021-07-27 ENCOUNTER — Encounter: Payer: Self-pay | Admitting: Internal Medicine

## 2021-07-27 ENCOUNTER — Encounter: Payer: Self-pay | Admitting: Physical Therapy

## 2021-07-27 VITALS — BP 120/80 | HR 70 | Ht 60.0 in | Wt 153.0 lb

## 2021-07-27 DIAGNOSIS — R000 Tachycardia, unspecified: Secondary | ICD-10-CM | POA: Insufficient documentation

## 2021-07-27 DIAGNOSIS — R002 Palpitations: Secondary | ICD-10-CM | POA: Diagnosis not present

## 2021-07-27 DIAGNOSIS — R079 Chest pain, unspecified: Secondary | ICD-10-CM | POA: Diagnosis not present

## 2021-07-27 DIAGNOSIS — M542 Cervicalgia: Secondary | ICD-10-CM | POA: Diagnosis present

## 2021-07-27 NOTE — Progress Notes (Signed)
New Outpatient Visit Date: 07/27/2021  Referring Provider: Jon Billings, NP [address] [place],  [place] [address]  Chief Complaint: Tachycardia and palpitations  HPI:  Ashley Wiley is a 45 y.o. female who is being seen today for the evaluation of palpitations at the request of Ashley Wiley. She has a history of hyperlipidemia, type 2 diabetes mellitus, and cerebral aneurysm s/p coiling (2019).  Ashley Wiley reports that she has noticed elevated HR (up to 120 bpm) over the last year while she is resting.  She would receive alerts of elevated heart rates on her smart watch up until about 2 months ago when she stopped wearing the device.  She sometimes notes palpitations accompanying this though she can have palpitations at other times as well.  The elevated heart rate episodes can last 20 to 30 minutes.  Palpitations are typically shorter.  She notes associated shortness of breath.  She feels like her palpitations have improved since she started fluoxetine about a month ago.  Ashley Wiley notes occasional chest pressure, which usually happens when at rest.  It seems to be associated with her elevated heart rates and is not exertional.  She denies shortness of breath.  She notes occasional orthostatic lightheadedness but has not passed out.  She has tried cutting down on her caffeine intake, drinking 1 cup of coffee once or twice a week and 1 caffeinated soda 2-3 days/week.  She denies a history of prior heart disease and testing.  She notes that her father had multiple MIs beginning around age 37 and developed heart failure before ultimately passing away.  --------------------------------------------------------------------------------------------------  Cardiovascular History & Procedures: Cardiovascular Problems: Palpitations and elevated heart rates Chest discomfort  Risk Factors: Edema, type 2 diabetes mellitus, obesity  Cath/PCI: None  CV Surgery: None  EP Procedures and  Devices: None  Non-Invasive Evaluation(s): None  Recent CV Pertinent Labs: Lab Results  Component Value Date   CHOL 99 (L) 01/06/2021   HDL 47 01/06/2021   LDLCALC 35 01/06/2021   TRIG 85 01/06/2021   INR 0.9 05/17/2021   K 3.8 05/17/2021   BUN 9 05/17/2021   BUN 6 01/06/2021   CREATININE 0.55 05/17/2021    --------------------------------------------------------------------------------------------------  Past Medical History:  Diagnosis Date   Allergy    Aneurysm ([place])    Anxiety    Back pain    Constipation    Depression    Dermatofibroma    Diabetes mellitus without complication ([facility])    Takes Metformin  dx 2016   Facial pain    Facial pain    GERD (gastroesophageal reflux disease)    Headache    Hyperlipidemia    IFG (impaired fasting glucose)    Low HDL (under 40)    Migraine    Obesity    Overweight    Patella-femoral syndrome    Pinguecula    Prediabetes    Thyroid nodule april 2016   Vitamin B12 deficiency    Vitamin D deficiency disease     Past Surgical History:  Procedure Laterality Date   CARPAL TUNNEL RELEASE     GANGLION CYST EXCISION Left    IR 3D INDEPENDENT WKST  12/04/2017   IR ANGIO INTRA EXTRACRAN SEL INTERNAL CAROTID BILAT MOD SED  12/04/2017   IR ANGIO INTRA EXTRACRAN SEL INTERNAL CAROTID BILAT MOD SED  05/17/2021   IR ANGIO INTRA EXTRACRAN SEL INTERNAL CAROTID UNI L MOD SED  03/05/2018   IR ANGIO INTRA EXTRACRAN SEL INTERNAL CAROTID UNI L  MOD SED  05/02/2019   IR ANGIO VERTEBRAL SEL VERTEBRAL BILAT MOD SED  12/04/2017   IR ANGIO VERTEBRAL SEL VERTEBRAL UNI R MOD SED  05/17/2021   IR ANGIOGRAM FOLLOW UP STUDY  03/05/2018   IR ANGIOGRAM FOLLOW UP STUDY  03/05/2018   IR ANGIOGRAM FOLLOW UP STUDY  03/05/2018   IR NEURO EACH ADD'L AFTER BASIC UNI LEFT (MS)  03/05/2018   IR TRANSCATH/EMBOLIZ  03/05/2018   IR US GUIDE VASC ACCESS RIGHT  05/17/2021   NASAL SINUS SURGERY  Nov 2015   RADIOLOGY WITH ANESTHESIA N/A 03/05/2018   Procedure:  Arteriogram, coil embolization, possible stenting;  Surgeon: Consuella Lose, MD;  Location: [facility];  Service: Radiology;  Laterality: N/A;   TUBAL LIGATION      Current Meds  Medication Sig   Azelaic Acid 15 % FOAM APPLY TO FACE EVERY MORNING AND WASH OFF EVERY NIGHT AT BEDTIME   busPIRone (BUSPAR) 10 MG tablet Take 1-1.5 tablets (10-15 mg total) by mouth 3 (three) times daily as needed.   cyclobenzaprine (FLEXERIL) 10 MG tablet Take 0.5-1 tablets by mouth at HS PRN   diclofenac (CATAFLAM) 50 MG tablet take 1 tablet by oral route 2 times every day as needed   diclofenac Sodium (VOLTAREN) 1 % GEL Apply 2 g topically 4 (four) times daily.   fluticasone (FLONASE) 50 MCG/ACT nasal spray PLACE 2 SPRAYS INTO BOTH NOSTRILS DAILY AS NEEDED FOR ALLERGIES.   glucose blood test strip CHECK BLOOD SUGAR DAILY   IBUPROFEN PO Take 800 mg by mouth 3 (three) times daily as needed. Advil   loratadine (CLARITIN) 10 MG tablet Take 10 mg by mouth daily as needed for allergies.    metFORMIN (GLUCOPHAGE-XR) 500 MG 24 hr tablet TAKE 1 TABLET BY MOUTH IN THE MORNING AND AT BEDTIME.   naproxen (NAPROSYN) 500 MG tablet Take 1 tablet (500 mg total) by mouth 2 (two) times daily with a meal.   Semaglutide, 1 MG/DOSE, (OZEMPIC, 1 MG/DOSE,) 4 MG/3ML SOPN Inject 1 mg into the skin once a week.   Tavaborole (KERYDIN) 5 % SOLN Apply to affected toenail nightly   terbinafine (LAMISIL) 250 MG tablet Take 1 tablet (250 mg total) by mouth daily. Take with food.   triamcinolone cream (KENALOG) 0.1 % Apply to affected areas hands 1-2 times a day until improved.   venlafaxine XR (EFFEXOR XR) 37.5 MG 24 hr capsule Take 1 capsule (37.5 mg total) by mouth daily with breakfast.    Allergies: Imitrex [sumatriptan] and Wellbutrin [bupropion]  Social History   Tobacco Use   Smoking status: Never   Smokeless tobacco: Never  Vaping Use   Vaping Use: Never used  Substance Use Topics   Alcohol use: Not Currently    Alcohol/week:  0.0 - 1.0 standard drinks    Comment: on occasion   Drug use: No    Family History  Problem Relation Age of Onset   Diabetes Father    Hypertension Father    Heart disease Father    Kidney disease Father    Stroke Father    Aneurysm Father    Hyperlipidemia Father    Depression Mother    Anxiety disorder Mother    Diabetes Paternal Grandfather     Review of Systems: A 12-system review of systems was performed and was negative except as noted in the HPI.  --------------------------------------------------------------------------------------------------  Physical Exam: BP 120/80 (BP Location: Right Arm, Patient Position: Sitting, Cuff Size: Normal)   Pulse 70  Ht 5' (1.524 m)   Wt 153 lb (69.4 kg)   SpO2 99%   BMI 29.88 kg/m   General: NAD. HEENT: No conjunctival pallor or scleral icterus. Facemask in place. Neck: Supple without lymphadenopathy, thyromegaly, JVD, or HJR. No carotid bruit. Lungs: Normal work of breathing. Clear to auscultation bilaterally without wheezes or crackles. Heart: Regular rate and rhythm without murmurs, rubs, or gallops. Non-displaced PMI. Abd: Bowel sounds present. Soft, NT/ND without hepatosplenomegaly Ext: No lower extremity edema. Radial, PT, and DP pulses are 2+ bilaterally Skin: Warm and dry without rash. Neuro: CNIII-XII intact. Strength and fine-touch sensation intact in upper and lower extremities bilaterally. Psych: Normal mood and affect.  EKG: Normal sinus rhythm without abnormality.  Lab Results  Component Value Date   WBC 7.1 05/17/2021   HGB 13.1 05/17/2021   HCT 40.0 05/17/2021   MCV 94.8 05/17/2021   PLT 196 05/17/2021    Lab Results  Component Value Date   NA 137 05/17/2021   K 3.8 05/17/2021   CL 100 05/17/2021   CO2 25 05/17/2021   BUN 9 05/17/2021   CREATININE 0.55 05/17/2021   GLUCOSE 89 05/17/2021   ALT 17 07/20/2021    Lab Results  Component Value Date   CHOL 99 (L) 01/06/2021   HDL 47 01/06/2021    LDLCALC 35 01/06/2021   TRIG 85 01/06/2021     --------------------------------------------------------------------------------------------------  ASSESSMENT AND PLAN: Palpitations and tachycardia: Symptoms have been present for about a year without obvious precipitant.  She notes that her elevated heart rates and palpitations happen most days.  Physical exam and EKG today are unrevealing.  We have agreed to obtain a 14-day event monitor and echocardiogram for further evaluation.  Chest pain: Ashley Wiley reports occasional nonexertional chest pain that seems to coincide with her palpitations.  The EKG today is normal.  Risk factors include diabetes mellitus, hyperlipidemia (lipids very well controlled on last check in 12/2020), and family history.  We will begin with an event monitor and echocardiogram, as detailed above.  Based on results and progression of symptoms, noninvasive ischemia testing will need to be considered in the future.  Follow-up: Return to clinic in 6 weeks.  Nelva Bush, MD 07/27/2021 9:00 AM

## 2021-07-27 NOTE — Patient Instructions (Signed)
Medication Instructions:   Your physician recommends that you continue on your current medications as directed. Please refer to the Current Medication list given to you today.  *If you need a refill on your cardiac medications before your next appointment, please call your pharmacy*   Lab Work:  None ordered  Testing/Procedures:  1) Your physician has requested that you have an echocardiogram. Echocardiography is a painless test that uses sound waves to create images of your heart. It provides your doctor with information about the size and shape of your heart and how well your heart's chambers and valves are working. This procedure takes approximately one hour. There are no restrictions for this procedure.  2) Your physician has recommended that you wear a Zio XT monitor for TWO WEEKS.   This monitor is a medical device that records the heart's electrical activity. Doctors most often use these monitors to diagnose arrhythmias. Arrhythmias are problems with the speed or rhythm of the heartbeat. The monitor is a small device applied to your chest. You can wear one while you do your normal daily activities. While wearing this monitor if you have any symptoms to push the button and record what you felt. Once you have worn this monitor for the period of time provider prescribed (Usually 14 days), you will return the monitor device in the postage paid box. Once it is returned they will download the data collected and provide [place] with a report which the provider will then review and we will call you with those results. Important tips:  Avoid showering during the first 24 hours of wearing the monitor. Avoid excessive sweating to help maximize wear time. Do not submerge the device, no hot tubs, and no swimming pools. Keep any lotions or oils away from the patch. After 24 hours you may shower with the patch on. Take brief showers with your back facing the shower head.  Do not remove patch once it has  been placed because that will interrupt data and decrease adhesive wear time. Push the button when you have any symptoms and write down what you were feeling. Once you have completed wearing your monitor, remove and place into box which has postage paid and place in your outgoing mailbox.  If for some reason you have misplaced your box then call our office and we can provide another box and/or mail it off for you.    Follow-Up: At [facility], you and your health needs are our priority.  As part of our continuing mission to provide you with exceptional heart care, we have created designated Provider Care Teams.  These Care Teams include your primary Cardiologist (physician) and Advanced Practice Providers (APPs -  Physician Assistants and Nurse Practitioners) who all work together to provide you with the care you need, when you need it.  We recommend signing up for the patient portal called "MyChart".  Sign up information is provided on this After Visit Summary.  MyChart is used to connect with patients for Virtual Visits (Telemedicine).  Patients are able to view lab/test results, encounter notes, upcoming appointments, etc.  Non-urgent messages can be sent to your provider as well.   To learn more about what you can do with MyChart, go to [email or web address].    Your next appointment:   6 week(s)  The format for your next appointment:   In Person  Provider:   You may see Dr. Harrell Gave End or one of the following Advanced Practice Providers on your designated Care Team:  Murray Hodgkins, NP Christell Faith, PA-C Marrianne Mood, PA-C Cadence Kathlen Mody, [place]

## 2021-07-27 NOTE — Therapy (Signed)
[facility] [address], [place], [address] Phone: [phone]   Fax:  [phone]  Physical Therapy Treatment  Patient Details  Name: Ashley Wiley MRN: [id number] Date of Birth: [birth year] No data recorded  Encounter Date: 07/27/2021   PT End of Session - 07/27/21 1309     Visit Number 8    Number of Visits 17    Date for PT Re-Evaluation 08/26/21    PT Start Time 1304    PT Stop Time 1342    PT Time Calculation (min) 38 min    Activity Tolerance Patient tolerated treatment well    Behavior During Therapy WFL for tasks assessed/performed             Past Medical History:  Diagnosis Date   Allergy    Aneurysm ([place])    Anxiety    Back pain    Constipation    Depression    Dermatofibroma    Diabetes mellitus without complication ([place])    Takes Metformin  dx 2016   Facial pain    Facial pain    GERD (gastroesophageal reflux disease)    Headache    Hyperlipidemia    IFG (impaired fasting glucose)    Low HDL (under 40)    Migraine    Obesity    Overweight    Patella-femoral syndrome    Pinguecula    Prediabetes    Thyroid nodule april 2016   Vitamin B12 deficiency    Vitamin D deficiency disease     Past Surgical History:  Procedure Laterality Date   CARPAL TUNNEL RELEASE     GANGLION CYST EXCISION Left    IR 3D INDEPENDENT WKST  12/04/2017   IR ANGIO INTRA EXTRACRAN SEL INTERNAL CAROTID BILAT MOD SED  12/04/2017   IR ANGIO INTRA EXTRACRAN SEL INTERNAL CAROTID BILAT MOD SED  05/17/2021   IR ANGIO INTRA EXTRACRAN SEL INTERNAL CAROTID UNI L MOD SED  03/05/2018   IR ANGIO INTRA EXTRACRAN SEL INTERNAL CAROTID UNI L MOD SED  05/02/2019   IR ANGIO VERTEBRAL SEL VERTEBRAL BILAT MOD SED  12/04/2017   IR ANGIO VERTEBRAL SEL VERTEBRAL UNI R MOD SED  05/17/2021   IR ANGIOGRAM FOLLOW UP STUDY  03/05/2018   IR ANGIOGRAM FOLLOW UP STUDY  03/05/2018   IR ANGIOGRAM FOLLOW UP STUDY  03/05/2018   IR  NEURO EACH ADD'L AFTER BASIC UNI LEFT (MS)  03/05/2018   IR TRANSCATH/EMBOLIZ  03/05/2018   IR US GUIDE VASC ACCESS RIGHT  05/17/2021   NASAL SINUS SURGERY  Nov 2015   RADIOLOGY WITH ANESTHESIA N/A 03/05/2018   Procedure: Arteriogram, coil embolization, possible stenting;  Surgeon: Consuella Lose, MD;  Location: [facility];  Service: Radiology;  Laterality: N/A;   TUBAL LIGATION      There were no vitals filed for this visit.   Subjective Assessment - 07/27/21 1306     Subjective Patient reports increased pain today to 5/10. Feels like her pain at work is better. Reports pain is localized to R side without adverse event. Is still having migraines every day but is trying to get in touch with neurology about this. Is completing HEP    Pertinent History Pt is a 45 year old female presenting with neck pain with tension headaches over the past few months, getting worse over the past few weeks. Pt also suffers from migraine headaches. 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 Reports pain  is usually R side of neck and shoulder, and will give her a tension headache wrapped around the R side of her head, reporting HA feeling as though it is right behind the R eye. has R hand n/t that comes and goes at random. Her pain is aggravated by paperwork >33mns, sweeping/mopping (looking down), emailing/texting. Pain is burning and tense in nature. Pain is minimally alleviated with ice. Was prescribed a muscle relaxer and reports this minimally helps. Worst pain 10/10; best 4/10; current 6/10. Pt is R handed. Pt denies N/V, B&B changes, unexplained weight fluctuation, saddle paresthesia, fever, night sweats, or unrelenting night pain at this time. No falls in past 6 months    Limitations Reading;Writing;Lifting;House hold activities    How long can you sit comfortably? unilmited    How long can you stand comfortably? unlimited    How long can you walk comfortably? unlimited    Diagnostic tests MRI neuralgia; DDD    Patient  Stated Goals Decrease pain    Pain Onset More than a month ago             Manual STM with trigger point release to R subocciptals, bilat UT, R levator, R pec minor (resolution of radicular n/t down RUE)following: Dry Needling: (2/2) 482m.25 needles placed along the R UT and R levator to decrease increased muscular spasms and trigger points with the patient positioned in prone. Patient was educated on risks and benefits of therapy and verbally consents to PT.    Ther-Ex Nustep seat 4 UE 5 L3 59m33mfor gentle protraction/retraction Seated on theraball rows BlackTB 3x 10 with min cuing for sitting posture initially with good carry over Push up plus from elevated mat 2x 10 with heavy multimodal cuing needed for technique with good carry over, positioning for cervical posture needed Doorway pec stretch 30sec with education on stretching and "open" posture to prevent tension and bracial plexus irritation here             PT Education - 07/27/21 1309     Education Details therex form/techinque    Person(s) Educated Patient    Methods Explanation;Demonstration;Verbal cues    Comprehension Verbalized understanding;Returned demonstration;Verbal cues required              PT Short Term Goals - 06/29/21 1309       PT SHORT TERM GOAL #1   Title Pt will be independent with HEP in order to improve strength and decrease back pain in order to improve pain-free function at home and work.    Baseline 06/29/21 HEP given    Time 8    Period Weeks    Status New               PT Long Term Goals - 06/29/21 1310       PT LONG TERM GOAL #1   Title Patient will increase FOTO score to 57 to demonstrate predicted increase in functional mobility to complete ADLs    Baseline 06/29/21 42    Time 8    Period Weeks    Status New      PT LONG TERM GOAL #2   Title Pt will decrease worst neck pain as reported on NPRS by at least 2 points in order to demonstrate clinically significant  reduction in back pain.    Baseline 06/29/21 10/10    Time 8    Period Weeks    Status New      PT LONG TERM GOAL #  3   Title Pt will demonstrate 4+/5 bilat lower trap strength needed for heavy household ADLs    Baseline 06/29/21 3+/5 bilat    Time 8    Period Weeks    Status New                   Plan - 07/27/21 1334     Clinical Impression Statement PT continued to utilize manual and TDN to reduce soft tissue tension to allow for neutral posture for effieciency of therex with sucess. Patient with increased pec minor tension today with noted redicular symptoms that resolve following trigger pointing to pec minor, educaiton on stretching and posture to prevent tension here with understanding. Patient is able to comply with cuing for proper technique of therex with good motivation throughout session. PT will continue progression of therex.    Personal Factors and Comorbidities Comorbidity 3+;Fitness;Social Background;Past/Current Experience;Time since onset of injury/illness/exacerbation;Sex;Profession;Age    Comorbidities A&D, HLD, migraines, DM2    Examination-Activity Limitations Lift;Carry;Sleep    Examination-Participation Restrictions Driving;Community Activity;Occupation;Cleaning    Stability/Clinical Decision Making Evolving/Moderate complexity    Clinical Decision Making Moderate    Rehab Potential Good    PT Frequency 2x / week    PT Duration 8 weeks    PT Treatment/Interventions ADLs/Self Care Home Management;Therapeutic exercise;Passive range of motion;Electrical Stimulation;Ultrasound;Traction;Moist Heat;Cryotherapy;Iontophoresis '4mg'$ /ml Dexamethasone;DME Instruction;Therapeutic activities;Neuromuscular re-education;Patient/family education;Manual techniques;Dry needling;Spinal Manipulations;Joint Manipulations    PT Next Visit Plan continue POC    PT Home Exercise Plan pec stretch, UT stretch, repeated retractions    Consulted and Agree with Plan of Care Patient              Patient will benefit from skilled therapeutic intervention in order to improve the following deficits and impairments:  Decreased activity tolerance, Decreased endurance, Decreased mobility, Decreased coordination, Increased muscle spasms, Decreased range of motion, Decreased strength, Increased fascial restricitons, Impaired UE functional use, Improper body mechanics, Pain, Postural dysfunction, Impaired flexibility, Impaired tone  Visit Diagnosis: Cervicalgia     Problem List Patient Active Problem List   Diagnosis Date Noted   Palpitations 07/27/2021   Tachycardia 07/27/2021   Chest pain 07/27/2021   Trigeminal neuralgia 12/13/2018   Type 2 diabetes mellitus with hyperglycemia, without long-term current use of insulin ([place]) 05/29/2018   Migraine without aura and without status migrainosus, not intractable 04/01/2018   Cerebral aneurysm, nonruptured 03/05/2018   Fibroid 02/08/2018   Menstrual migraine without status migrainosus, not intractable 02/08/2018   Menorrhagia with regular cycle 02/08/2018   Acute anxiety 01/13/2016   History of HPV infection 01/13/2016   Elevated ALT measurement 10/05/2015   Allergic rhinitis 07/13/2015   Thyroid nodule 07/13/2015   Obesity 05/14/2015   Hyperlipidemia    Patella-femoral syndrome    Vitamin D deficiency disease    Dermatofibroma    Depression    Pinguecula    Chronic pansinusitis 11/11/2014   Durwin Reges DPT Durwin Reges, PT 07/27/2021, 1:41 PM  [facility] [address] [place], [address] Phone: [phone]   Fax:  [phone]  Name: Ashley Wiley MRN: [id number] Date of Birth: [birth year]

## 2021-07-29 ENCOUNTER — Other Ambulatory Visit: Payer: Self-pay

## 2021-07-29 ENCOUNTER — Encounter: Payer: Self-pay | Admitting: Physical Therapy

## 2021-07-29 ENCOUNTER — Ambulatory Visit: Payer: No Typology Code available for payment source | Admitting: Physical Therapy

## 2021-07-29 DIAGNOSIS — M542 Cervicalgia: Secondary | ICD-10-CM

## 2021-07-29 NOTE — Therapy (Signed)
[facility] [address], [place], [address] Phone: [phone]   Fax:  [phone]  Physical Therapy Treatment  Patient Details  Name: Ashley Wiley MRN: [id number] Date of Birth: [birth year] No data recorded  Encounter Date: 07/29/2021   PT End of Session - 07/29/21 1143     Visit Number 9    Number of Visits 17    Date for PT Re-Evaluation 08/26/21    PT Start Time 1107    PT Stop Time [id number]    PT Time Calculation (min) 35 min    Activity Tolerance Patient tolerated treatment well    Behavior During Therapy WFL for tasks assessed/performed             Past Medical History:  Diagnosis Date   Allergy    Aneurysm ([place])    Anxiety    Back pain    Constipation    Depression    Dermatofibroma    Diabetes mellitus without complication ([place])    Takes Metformin  dx 2016   Facial pain    Facial pain    GERD (gastroesophageal reflux disease)    Headache    Hyperlipidemia    IFG (impaired fasting glucose)    Low HDL (under 40)    Migraine    Obesity    Overweight    Patella-femoral syndrome    Pinguecula    Prediabetes    Thyroid nodule april 2016   Vitamin B12 deficiency    Vitamin D deficiency disease     Past Surgical History:  Procedure Laterality Date   CARPAL TUNNEL RELEASE     GANGLION CYST EXCISION Left    IR 3D INDEPENDENT WKST  12/04/2017   IR ANGIO INTRA EXTRACRAN SEL INTERNAL CAROTID BILAT MOD SED  12/04/2017   IR ANGIO INTRA EXTRACRAN SEL INTERNAL CAROTID BILAT MOD SED  05/17/2021   IR ANGIO INTRA EXTRACRAN SEL INTERNAL CAROTID UNI L MOD SED  03/05/2018   IR ANGIO INTRA EXTRACRAN SEL INTERNAL CAROTID UNI L MOD SED  05/02/2019   IR ANGIO VERTEBRAL SEL VERTEBRAL BILAT MOD SED  12/04/2017   IR ANGIO VERTEBRAL SEL VERTEBRAL UNI R MOD SED  05/17/2021   IR ANGIOGRAM FOLLOW UP STUDY  03/05/2018   IR ANGIOGRAM FOLLOW UP STUDY  03/05/2018   IR ANGIOGRAM FOLLOW UP STUDY  03/05/2018   IR  NEURO EACH ADD'L AFTER BASIC UNI LEFT (MS)  03/05/2018   IR TRANSCATH/EMBOLIZ  03/05/2018   IR US GUIDE VASC ACCESS RIGHT  05/17/2021   NASAL SINUS SURGERY  Nov 2015   RADIOLOGY WITH ANESTHESIA N/A 03/05/2018   Procedure: Arteriogram, coil embolization, possible stenting;  Surgeon: Consuella Lose, MD;  Location: [facility];  Service: Radiology;  Laterality: N/A;   TUBAL LIGATION      There were no vitals filed for this visit.   Subjective Assessment - 07/29/21 1109     Subjective Pt reports her upper back is sore and her neck is feeling much better.  She denies any pain in her neck today.    Pertinent History Pt is a 45 year old female presenting with neck pain with tension headaches over the past few months, getting worse over the past few weeks. Pt also suffers from migraine headaches. 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 has R hand n/t that comes and goes at random. Her pain is aggravated by paperwork >73mns, sweeping/mopping (looking down), emailing/texting. Pain is burning and tense in nature. Pain is minimally alleviated with ice. Was prescribed a muscle relaxer and reports this minimally helps. Worst pain 10/10; best 4/10; current 6/10. Pt is R handed. Pt denies N/V, B&B changes, unexplained weight fluctuation, saddle paresthesia, fever, night sweats, or unrelenting night pain at this time. No falls in past 6 months    Limitations Reading;Writing;Lifting;House hold activities    How long can you sit comfortably? unilmited    How long can you stand comfortably? unlimited    How long can you walk comfortably? unlimited    Diagnostic tests MRI neuralgia; DDD    Patient Stated Goals Decrease pain             Ther-Ex Nustep seat 4 UE 5 L3 558m for gentle protraction/retraction  TRX Row 2 x 10 reps with  emphasis on scapular retraction   Standing Posterior delt fly 2 x 8 reps YTB  Standing Y raises yellow theraband 2 x 10 reps with emphasis on scapular retraction  Supine Deep neck flexion  hold 3 x 15 sec with cues to tuck chin and lift head about 1-2" from table   Doorway pec stretch 3 x 30sec with education on stretching and "open" posture to prevent tension and bracial plexus irritation here  Standing Serratus punches with GTB 1 x 10 with cues for cervical posture                          PT Education - 07/29/21 1147     Education Details therex form/technique    Person(s) Educated Patient    Methods Explanation;Demonstration    Comprehension Verbalized understanding;Returned demonstration              PT Short Term Goals - 06/29/21 1309       PT SHORT TERM GOAL #1   Title Pt will be independent with HEP in order to improve strength and decrease back pain in order to improve pain-free function at home and work.    Baseline 06/29/21 HEP given    Time 8    Period Weeks    Status New               PT Long Term Goals - 06/29/21 1310       PT LONG TERM GOAL #1   Title Patient will increase FOTO score to 57 to demonstrate predicted increase in functional mobility to complete ADLs    Baseline 06/29/21 42    Time 8    Period Weeks    Status New      PT LONG TERM GOAL #2   Title Pt will decrease worst neck pain as reported on NPRS by at least 2 points in order to demonstrate clinically significant reduction in back pain.    Baseline 06/29/21 10/10    Time 8    Period Weeks    Status New      PT LONG TERM GOAL #3   Title Pt will demonstrate 4+/5 bilat lower trap strength needed for heavy household ADLs    Baseline 06/29/21 3+/5 bilat    Time 8    Period Weeks    Status New  Plan - 07/29/21 1144     Clinical Impression Statement Pt tolerated session well with no increase in pain following manual therapy and  exercise. PT continued periscapular strengthening and deep neck stabilization exercises this session. Pt demonstrated good carry over with PT cueing and demonstration. Patient will continue to benefit from skilled therapy for exercise performance to ensure proper technique for safe and effective progression.    Personal Factors and Comorbidities Comorbidity 3+;Fitness;Social Background;Past/Current Experience;Time since onset of injury/illness/exacerbation;Sex;Profession;Age    Comorbidities A&D, HLD, migraines, DM2    Examination-Activity Limitations Lift;Carry;Sleep    Examination-Participation Restrictions Driving;Community Activity;Occupation;Cleaning    Stability/Clinical Decision Making Evolving/Moderate complexity    Rehab Potential Good    PT Frequency 2x / week    PT Duration 8 weeks    PT Treatment/Interventions ADLs/Self Care Home Management;Therapeutic exercise;Passive range of motion;Electrical Stimulation;Ultrasound;Traction;Moist Heat;Cryotherapy;Iontophoresis '4mg'$ /ml Dexamethasone;DME Instruction;Therapeutic activities;Neuromuscular re-education;Patient/family education;Manual techniques;Dry needling;Spinal Manipulations;Joint Manipulations    PT Next Visit Plan continue POC    PT Home Exercise Plan pec stretch, UT stretch, repeated retractions    Consulted and Agree with Plan of Care Patient             Patient will benefit from skilled therapeutic intervention in order to improve the following deficits and impairments:  Decreased activity tolerance, Decreased endurance, Decreased mobility, Decreased coordination, Increased muscle spasms, Decreased range of motion, Decreased strength, Increased fascial restricitons, Impaired UE functional use, Improper body mechanics, Pain, Postural dysfunction, Impaired flexibility, Impaired tone  Visit Diagnosis: Cervicalgia     Problem List Patient Active Problem List   Diagnosis Date Noted   Palpitations 07/27/2021   Tachycardia  07/27/2021   Chest pain 07/27/2021   Trigeminal neuralgia 12/13/2018   Type 2 diabetes mellitus with hyperglycemia, without long-term current use of insulin ([place]) 05/29/2018   Migraine without aura and without status migrainosus, not intractable 04/01/2018   Cerebral aneurysm, nonruptured 03/05/2018   Fibroid 02/08/2018   Menstrual migraine without status migrainosus, not intractable 02/08/2018   Menorrhagia with regular cycle 02/08/2018   Acute anxiety 01/13/2016   History of HPV infection 01/13/2016   Elevated ALT measurement 10/05/2015   Allergic rhinitis 07/13/2015   Thyroid nodule 07/13/2015   Obesity 05/14/2015   Hyperlipidemia    Patella-femoral syndrome    Vitamin D deficiency disease    Dermatofibroma    Depression    Pinguecula    Chronic pansinusitis 11/11/2014     Durwin Reges DPT Sharion Settler, SPT  Durwin Reges, PT 07/29/2021, 11:50 AM  [facility] [address], [place], [address] Phone: [phone]   Fax:  [phone]  Name: AUBREEANA WAHID MRN: [id number] Date of Birth: Feb 25, 1976

## 2021-08-01 ENCOUNTER — Other Ambulatory Visit: Payer: Self-pay

## 2021-08-03 ENCOUNTER — Encounter: Payer: Self-pay | Admitting: Physical Therapy

## 2021-08-03 ENCOUNTER — Ambulatory Visit: Payer: No Typology Code available for payment source | Admitting: Physical Therapy

## 2021-08-03 DIAGNOSIS — M542 Cervicalgia: Secondary | ICD-10-CM | POA: Diagnosis not present

## 2021-08-03 NOTE — Therapy (Signed)
[facility] [address], [place], [address] Phone: [phone]   Fax:  [phone]  Physical Therapy Treatment Progress Note Reporting period 06/29/21-08/03/21  Patient Details  Name: Ashley Wiley MRN: [id number] Date of Birth: Jan 20, 1976 No data recorded  Encounter Date: 08/03/2021   PT End of Session - 08/03/21 1117     Visit Number 10    Number of Visits 17    Date for PT Re-Evaluation 08/26/21    PT Start Time 1117    PT Stop Time 1157    PT Time Calculation (min) 40 min    Activity Tolerance Patient tolerated treatment well    Behavior During Therapy WFL for tasks assessed/performed             Past Medical History:  Diagnosis Date   Allergy    Aneurysm ([place])    Anxiety    Back pain    Constipation    Depression    Dermatofibroma    Diabetes mellitus without complication ([place])    Takes Metformin  dx 2016   Facial pain    Facial pain    GERD (gastroesophageal reflux disease)    Headache    Hyperlipidemia    IFG (impaired fasting glucose)    Low HDL (under 40)    Migraine    Obesity    Overweight    Patella-femoral syndrome    Pinguecula    Prediabetes    Thyroid nodule april 2016   Vitamin B12 deficiency    Vitamin D deficiency disease     Past Surgical History:  Procedure Laterality Date   CARPAL TUNNEL RELEASE     GANGLION CYST EXCISION Left    IR 3D INDEPENDENT WKST  12/04/2017   IR ANGIO INTRA EXTRACRAN SEL INTERNAL CAROTID BILAT MOD SED  12/04/2017   IR ANGIO INTRA EXTRACRAN SEL INTERNAL CAROTID BILAT MOD SED  05/17/2021   IR ANGIO INTRA EXTRACRAN SEL INTERNAL CAROTID UNI L MOD SED  03/05/2018   IR ANGIO INTRA EXTRACRAN SEL INTERNAL CAROTID UNI L MOD SED  05/02/2019   IR ANGIO VERTEBRAL SEL VERTEBRAL BILAT MOD SED  12/04/2017   IR ANGIO VERTEBRAL SEL VERTEBRAL UNI R MOD SED  05/17/2021   IR ANGIOGRAM FOLLOW UP STUDY  03/05/2018   IR ANGIOGRAM FOLLOW UP STUDY  03/05/2018    IR ANGIOGRAM FOLLOW UP STUDY  03/05/2018   IR NEURO EACH ADD'L AFTER BASIC UNI LEFT (MS)  03/05/2018   IR TRANSCATH/EMBOLIZ  03/05/2018   IR US GUIDE VASC ACCESS RIGHT  05/17/2021   NASAL SINUS SURGERY  Nov 2015   RADIOLOGY WITH ANESTHESIA N/A 03/05/2018   Procedure: Arteriogram, coil embolization, possible stenting;  Surgeon: Consuella Lose, MD;  Location: [facility];  Service: Radiology;  Laterality: N/A;   TUBAL LIGATION      There were no vitals filed for this visit.   Subjective Assessment - 08/03/21 1118     Subjective Pt reports her upper back is sore no longer sore and her neck is feeling much better.  She rates her neck pain as 2/10 today.    Pertinent History Pt is a 45 year old female presenting with neck pain with tension headaches over the past few months, getting worse over the past few weeks. Pt also suffers from migraine headaches. 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 Reports pain is usually R side of neck and shoulder, and will give her a tension  headache wrapped around the R side of her head, reporting HA feeling as though it is right behind the R eye. has R hand n/t that comes and goes at random. Her pain is aggravated by paperwork >25mns, sweeping/mopping (looking down), emailing/texting. Pain is burning and tense in nature. Pain is minimally alleviated with ice. Was prescribed a muscle relaxer and reports this minimally helps. Worst pain 10/10; best 4/10; current 6/10. Pt is R handed. Pt denies N/V, B&B changes, unexplained weight fluctuation, saddle paresthesia, fever, night sweats, or unrelenting night pain at this time. No falls in past 6 months    Limitations Reading;Writing;Lifting;House hold activities    How long can you sit comfortably? unilmited    How long can you stand comfortably? unlimited    How long can you walk comfortably? unlimited    Diagnostic tests MRI neuralgia; DDD    Patient Stated Goals Decrease pain              Ther-Ex  Nustep seat 4 UE 5 L3  553m for gentle protraction/retraction   Standing Y raises yellow theraband 2 x 10 reps with emphasis on scapular retraction   Chin tucks x 12 reps   Supine Deep neck flexion hold 3 x 20 sec with cues to tuck chin and lift head about 1-2" from table   TRX Row 2 x 10 reps with emphasis on scapular retraction    Standing Posterior delt fly 2 x 8 reps YTB   Tspine rotation on wall x 8 reps with 5 sec hold   Chest press 10# 2 x 10 reps   *PT progress note performed this visit. See goals and impression statement for details.                              PT Education - 08/03/21 1204     Education Details Pt educated on progress to date.    Person(s) Educated Patient    Methods Explanation    Comprehension Verbalized understanding              PT Short Term Goals - 08/03/21 1121       PT SHORT TERM GOAL #1   Title Pt will be independent with HEP in order to improve strength and decrease back pain in order to improve pain-free function at home and work.    Baseline 06/29/21 HEP given 08/03/21: pt reports regular participation in HEP    Time 8    Period Weeks    Status Achieved               PT Long Term Goals - 08/03/21 1122       PT LONG TERM GOAL #1   Title Patient will increase FOTO score to 59 to demonstrate predicted increase in functional mobility to complete ADLs    Baseline 06/29/21 42 08/03/21:53    Time 8    Period Weeks    Status New      PT LONG TERM GOAL #2   Title Pt will decrease worst neck pain as reported on NPRS by at least 2 points in order to demonstrate clinically significant reduction in back pain.    Baseline 06/29/21 10/10 08/03/21: 6/10    Time 8    Period Weeks    Status Achieved      PT LONG TERM GOAL #3   Title Pt will demonstrate 4+/5 bilat lower trap strength needed for heavy household  ADLs    Baseline 06/29/21 3+/5 bilat 08/03/21: 4/5    Time 8    Period Weeks    Status On-going                    Plan - 08/03/21 1157     Clinical Impression Statement PT progress note performed this visit. Pt has reported increase in overall function, decrease in worst neck pain, and dmeonstrated improved lower trap strength.  Pt tolerated session well reported decrease in neck pain pain following therepeutic exercise. PT continued periscapular strengthening and deep neck stabilization exercises this session. Pt demonstrated good carry over with PT cueing and demonstration. Patient will continue to benefit from skilled therapy for exercise performance to ensure proper technique for safe and effective progression.    Personal Factors and Comorbidities Comorbidity 3+;Fitness;Social Background;Past/Current Experience;Time since onset of injury/illness/exacerbation;Sex;Profession;Age    Comorbidities A&D, HLD, migraines, DM2    Examination-Activity Limitations Lift;Carry;Sleep    Examination-Participation Restrictions Driving;Community Activity;Occupation;Cleaning    Stability/Clinical Decision Making Evolving/Moderate complexity    Clinical Decision Making Moderate    Rehab Potential Good    PT Frequency 2x / week    PT Duration 8 weeks    PT Treatment/Interventions ADLs/Self Care Home Management;Therapeutic exercise;Passive range of motion;Electrical Stimulation;Ultrasound;Traction;Moist Heat;Cryotherapy;Iontophoresis '4mg'$ /ml Dexamethasone;DME Instruction;Therapeutic activities;Neuromuscular re-education;Patient/family education;Manual techniques;Dry needling;Spinal Manipulations;Joint Manipulations    PT Next Visit Plan continue POC    PT Home Exercise Plan pec stretch, UT stretch, repeated retractions    Consulted and Agree with Plan of Care Patient             Patient will benefit from skilled therapeutic intervention in order to improve the following deficits and impairments:  Decreased activity tolerance, Decreased endurance, Decreased mobility, Decreased coordination, Increased  muscle spasms, Decreased range of motion, Decreased strength, Increased fascial restricitons, Impaired UE functional use, Improper body mechanics, Pain, Postural dysfunction, Impaired flexibility, Impaired tone  Visit Diagnosis: Cervicalgia     Problem List Patient Active Problem List   Diagnosis Date Noted   Palpitations 07/27/2021   Tachycardia 07/27/2021   Chest pain 07/27/2021   Trigeminal neuralgia 12/13/2018   Type 2 diabetes mellitus with hyperglycemia, without long-term current use of insulin ([place]) 05/29/2018   Migraine without aura and without status migrainosus, not intractable 04/01/2018   Cerebral aneurysm, nonruptured 03/05/2018   Fibroid 02/08/2018   Menstrual migraine without status migrainosus, not intractable 02/08/2018   Menorrhagia with regular cycle 02/08/2018   Acute anxiety 01/13/2016   History of HPV infection 01/13/2016   Elevated ALT measurement 10/05/2015   Allergic rhinitis 07/13/2015   Thyroid nodule 07/13/2015   Obesity 05/14/2015   Hyperlipidemia    Patella-femoral syndrome    Vitamin D deficiency disease    Dermatofibroma    Depression    Pinguecula    Chronic pansinusitis 11/11/2014    Durwin Reges DPT Sharion Settler, SPT  Durwin Reges, PT 08/04/2021, 11:09 AM  [facility] [address] [place], [address] Phone: [phone]   Fax:  [phone]  Name: Ashley Wiley MRN: [id number] Date of Birth: 1976/08/28

## 2021-08-04 ENCOUNTER — Other Ambulatory Visit: Payer: Self-pay

## 2021-08-04 MED ORDER — EMGALITY 120 MG/ML ~~LOC~~ SOAJ
SUBCUTANEOUS | 0 refills | Status: DC
  Filled 2021-08-04 (×2): qty 2, 2d supply, fill #0
  Filled 2021-08-23: qty 2, 1d supply, fill #0

## 2021-08-04 MED ORDER — EMGALITY 120 MG/ML ~~LOC~~ SOAJ
SUBCUTANEOUS | 11 refills | Status: DC
  Filled 2021-08-04 – 2021-09-19 (×2): qty 1, 28d supply, fill #0
  Filled 2021-11-09: qty 1, 28d supply, fill #1

## 2021-08-04 MED ORDER — PREDNISONE 10 MG PO TABS
ORAL_TABLET | ORAL | 0 refills | Status: DC
  Filled 2021-08-04: qty 21, 6d supply, fill #0

## 2021-08-05 ENCOUNTER — Other Ambulatory Visit: Payer: Self-pay

## 2021-08-05 ENCOUNTER — Ambulatory Visit (INDEPENDENT_AMBULATORY_CARE_PROVIDER_SITE_OTHER): Payer: No Typology Code available for payment source | Admitting: Nurse Practitioner

## 2021-08-05 ENCOUNTER — Ambulatory Visit: Payer: No Typology Code available for payment source | Admitting: Physical Therapy

## 2021-08-05 ENCOUNTER — Encounter: Payer: Self-pay | Admitting: Nurse Practitioner

## 2021-08-05 VITALS — BP 118/71 | HR 89 | Temp 98.9°F | Wt 152.0 lb

## 2021-08-05 DIAGNOSIS — Z5189 Encounter for other specified aftercare: Secondary | ICD-10-CM | POA: Diagnosis not present

## 2021-08-05 MED ORDER — CEPHALEXIN 500 MG PO CAPS: 500.0000 mg | ORAL_CAPSULE | Freq: Three times a day (TID) | ORAL | 0 refills | Status: DC

## 2021-08-05 NOTE — Assessment & Plan Note (Signed)
No redness, obvious infection. Minimal amount of sanguinous drainage. Procedure was 6 weeks ago. Will treat with keflex TID x 7 days, epsom salt soaks, and OTC bacitracin/neosporin ointment. F/U if not improving in 7 days and will place referral to podiatry.

## 2021-08-05 NOTE — Progress Notes (Signed)
Acute Office Visit  Subjective:    Patient ID: Ashley Wiley, female    DOB: [birth year], 45 y.o.   MRN: [id number]  Chief Complaint  Patient presents with   Nail Problem    Pt states her toenail is not healing like she feels it should be. Ingrown toenail removed on 06/27/21.    HPI Patient is in today for follow-up after having an ingrown toenail removed on 06/27/21. She states that a few days ago, she started having increased pain and redness to her left great toe along with some drainage. She has been washing it daily and applying a topical antibiotic ointment to it. She states that since Tuesday, it has started looking better. She had a prior ingrown toenail removed and it didn't take this long to heal. She denies fevers, swelling, and red streaks up her foot.   Past Medical History:  Diagnosis Date   Allergy    Aneurysm ([place])    Anxiety    Back pain    Constipation    Depression    Dermatofibroma    Diabetes mellitus without complication ([place])    Takes Metformin  dx 2016   Facial pain    Facial pain    GERD (gastroesophageal reflux disease)    Headache    Hyperlipidemia    IFG (impaired fasting glucose)    Low HDL (under 40)    Migraine    Obesity    Overweight    Patella-femoral syndrome    Pinguecula    Prediabetes    Thyroid nodule april 2016   Vitamin B12 deficiency    Vitamin D deficiency disease     Past Surgical History:  Procedure Laterality Date   CARPAL TUNNEL RELEASE     GANGLION CYST EXCISION Left    IR 3D INDEPENDENT WKST  12/04/2017   IR ANGIO INTRA EXTRACRAN SEL INTERNAL CAROTID BILAT MOD SED  12/04/2017   IR ANGIO INTRA EXTRACRAN SEL INTERNAL CAROTID BILAT MOD SED  05/17/2021   IR ANGIO INTRA EXTRACRAN SEL INTERNAL CAROTID UNI L MOD SED  03/05/2018   IR ANGIO INTRA EXTRACRAN SEL INTERNAL CAROTID UNI L MOD SED  05/02/2019   IR ANGIO VERTEBRAL SEL VERTEBRAL BILAT MOD SED  12/04/2017   IR ANGIO VERTEBRAL SEL VERTEBRAL UNI R MOD SED  05/17/2021   IR  ANGIOGRAM FOLLOW UP STUDY  03/05/2018   IR ANGIOGRAM FOLLOW UP STUDY  03/05/2018   IR ANGIOGRAM FOLLOW UP STUDY  03/05/2018   IR NEURO EACH ADD'L AFTER BASIC UNI LEFT (MS)  03/05/2018   IR TRANSCATH/EMBOLIZ  03/05/2018   IR US GUIDE VASC ACCESS RIGHT  05/17/2021   NASAL SINUS SURGERY  Nov 2015   RADIOLOGY WITH ANESTHESIA N/A 03/05/2018   Procedure: Arteriogram, coil embolization, possible stenting;  Surgeon: Consuella Lose, MD;  Location: [facility];  Service: Radiology;  Laterality: N/A;   TUBAL LIGATION      Family History  Problem Relation Age of Onset   Depression Mother    Anxiety disorder Mother    Heart attack Father 62   Heart failure Father    Diabetes Father    Hypertension Father    Heart disease Father    Kidney disease Father    Stroke Father    Aneurysm Father    Hyperlipidemia Father    Diabetes Paternal Grandfather    Sudden death Neg Hx     Social History   Socioeconomic History   Marital status: Married  Spouse name: Not on file   Number of children: Not on file   Years of education: Not on file   Highest education level: Not on file  Occupational History   Not on file  Tobacco Use   Smoking status: Never   Smokeless tobacco: Never  Vaping Use   Vaping Use: Never used  Substance and Sexual Activity   Alcohol use: Not Currently    Alcohol/week: 0.0 - 1.0 standard drinks    Comment: Drinks 3-4 times per year   Drug use: No   Sexual activity: Yes    Birth control/protection: None  Other Topics Concern   Not on file  Social History Narrative   Not on file   Social Determinants of Health   Financial Resource Strain: Not on file  Food Insecurity: Not on file  Transportation Needs: Not on file  Physical Activity: Not on file  Stress: Not on file  Social Connections: Not on file  Intimate Partner Violence: Not on file    Outpatient Medications Prior to Visit  Medication Sig Dispense Refill   Azelaic Acid 15 % FOAM APPLY TO FACE EVERY MORNING  AND WASH OFF EVERY NIGHT AT BEDTIME 50 g 2   Botulinum Toxin Type A (BOTOX) 200 units SOLR Inject 200 units intramuscularly to face/neck every 3 months by md 1 each 4   busPIRone (BUSPAR) 10 MG tablet Take 1-1.5 tablets (10-15 mg total) by mouth 3 (three) times daily as needed. 270 tablet 1   fluticasone (FLONASE) 50 MCG/ACT nasal spray PLACE 2 SPRAYS INTO BOTH NOSTRILS DAILY AS NEEDED FOR ALLERGIES. 16 g 12   Galcanezumab-gnlm (EMGALITY) 120 MG/ML SOAJ Inject 120 mg subcutaneously every 28 (twenty-eight) days Start 1 month after loading dose 1 mL 11   Galcanezumab-gnlm (EMGALITY) 120 MG/ML SOAJ Inject 240 mg subcutaneously once for 1 dose Initial loading dose 2 mL 0   glucose blood test strip CHECK BLOOD SUGAR DAILY 100 strip 12   IBUPROFEN PO Take 800 mg by mouth 3 (three) times daily as needed. Advil     loratadine (CLARITIN) 10 MG tablet Take 10 mg by mouth daily as needed for allergies.      metFORMIN (GLUCOPHAGE-XR) 500 MG 24 hr tablet TAKE 1 TABLET BY MOUTH IN THE MORNING AND AT BEDTIME. 180 tablet 1   naproxen (NAPROSYN) 500 MG tablet Take 1 tablet (500 mg total) by mouth 2 (two) times daily with a meal. 180 tablet 1   predniSONE (DELTASONE) 10 MG tablet Take '60mg'$  day 1, '50mg'$  day 2, '40mg'$  day 3, '30mg'$  day 4, '20mg'$  day 5, '10mg'$  day 6, then stop 21 tablet 0   Semaglutide, 1 MG/DOSE, (OZEMPIC, 1 MG/DOSE,) 4 MG/3ML SOPN Inject 1 mg into the skin once a week. 3 mL 6   Tavaborole (KERYDIN) 5 % SOLN Apply to affected toenail nightly 10 mL 3   terbinafine (LAMISIL) 250 MG tablet Take 1 tablet (250 mg total) by mouth daily. Take with food. 30 tablet 1   triamcinolone cream (KENALOG) 0.1 % Apply to affected areas hands 1-2 times a day until improved. 45 g 1   venlafaxine XR (EFFEXOR XR) 37.5 MG 24 hr capsule Take 1 capsule (37.5 mg total) by mouth daily with breakfast. 60 capsule 2   cyclobenzaprine (FLEXERIL) 10 MG tablet Take 0.5-1 tablets by mouth at HS PRN     diclofenac (CATAFLAM) 50 MG tablet take  1 tablet by oral route 2 times every day as needed 60 tablet 0  diclofenac Sodium (VOLTAREN) 1 % GEL Apply 2 g topically 4 (four) times daily. 100 g 2   No facility-administered medications prior to visit.    Allergies  Allergen Reactions   Imitrex [Sumatriptan] Other (See Comments)    Chest pain    Wellbutrin [Bupropion] Other (See Comments)    Increased anxiety and tremors    Review of Systems  Constitutional: Negative.   Respiratory: Negative.    Cardiovascular: Negative.   Gastrointestinal: Negative.   Skin:  Positive for wound (left great toe).  Neurological: Negative.       Objective:    Physical Exam Vitals and nursing note reviewed.  Constitutional:      General: She is not in acute distress.    Appearance: Normal appearance.  HENT:     Head: Normocephalic.  Eyes:     Conjunctiva/sclera: Conjunctivae normal.  Cardiovascular:     Rate and Rhythm: Normal rate.  Pulmonary:     Effort: Pulmonary effort is normal.  Musculoskeletal:     Cervical back: Normal range of motion.  Skin:    General: Skin is warm.     Comments: Left great toe with minimal sanguinous drainage. No redness, or swelling  Neurological:     General: No focal deficit present.     Mental Status: She is alert and oriented to person, place, and time.  Psychiatric:        Mood and Affect: Mood normal.        Behavior: Behavior normal.        Thought Content: Thought content normal.        Judgment: Judgment normal.    BP 118/71   Pulse 89   Temp 98.9 F (37.2 C) (Oral)   Wt 152 lb (68.9 kg)   SpO2 100%   BMI 29.69 kg/m  Wt Readings from Last 3 Encounters:  08/05/21 152 lb (68.9 kg)  07/27/21 153 lb (69.4 kg)  06/27/21 154 lb 6.4 oz (70 kg)    Health Maintenance Due  Topic Date Due   INFLUENZA VACCINE  06/20/2021   FOOT EXAM  07/06/2021   HEMOGLOBIN A1C  07/06/2021   URINE MICROALBUMIN  07/06/2021    There are no preventive care reminders to display for this  patient.   Lab Results  Component Value Date   TSH 2.280 08/04/2020   Lab Results  Component Value Date   WBC 7.1 05/17/2021   HGB 13.1 05/17/2021   HCT 40.0 05/17/2021   MCV 94.8 05/17/2021   PLT 196 05/17/2021   Lab Results  Component Value Date   NA 137 05/17/2021   K 3.8 05/17/2021   CO2 25 05/17/2021   GLUCOSE 89 05/17/2021   BUN 9 05/17/2021   CREATININE 0.55 05/17/2021   BILITOT 0.6 07/20/2021   ALKPHOS 84 07/20/2021   AST 23 07/20/2021   ALT 17 07/20/2021   PROT 6.9 07/20/2021   ALBUMIN 4.6 07/20/2021   CALCIUM 9.0 05/17/2021   ANIONGAP 12 05/17/2021   Lab Results  Component Value Date   CHOL 99 (L) 01/06/2021   Lab Results  Component Value Date   HDL 47 01/06/2021   Lab Results  Component Value Date   LDLCALC 35 01/06/2021   Lab Results  Component Value Date   TRIG 85 01/06/2021   No results found for: [facility] Lab Results  Component Value Date   HGBA1C 5.1 01/06/2021       Assessment & Plan:   Problem List Items Addressed This  Visit       Other   Visit for wound check - Primary    No redness, obvious infection. Minimal amount of sanguinous drainage. Procedure was 6 weeks ago. Will treat with keflex TID x 7 days, epsom salt soaks, and OTC bacitracin/neosporin ointment. F/U if not improving in 7 days and will place referral to podiatry.         Meds ordered this encounter  Medications   cephALEXin (KEFLEX) 500 MG capsule    Sig: Take 1 capsule (500 mg total) by mouth 3 (three) times daily.    Dispense:  21 capsule    Refill:  0     Charyl Dancer, NP

## 2021-08-05 NOTE — Patient Instructions (Signed)
Soak foot in epsom salt daily for 20 minutes. Then pat dry, place antibiotic cream to toe Take keflex 3 times a day for 7 days.

## 2021-08-09 ENCOUNTER — Ambulatory Visit: Payer: No Typology Code available for payment source | Admitting: Nurse Practitioner

## 2021-08-10 ENCOUNTER — Ambulatory Visit: Payer: No Typology Code available for payment source | Admitting: Physical Therapy

## 2021-08-12 ENCOUNTER — Encounter: Payer: Self-pay | Admitting: Physical Therapy

## 2021-08-12 ENCOUNTER — Ambulatory Visit: Payer: No Typology Code available for payment source

## 2021-08-12 ENCOUNTER — Other Ambulatory Visit: Payer: Self-pay

## 2021-08-12 DIAGNOSIS — M542 Cervicalgia: Secondary | ICD-10-CM | POA: Diagnosis not present

## 2021-08-12 NOTE — Therapy (Signed)
[facility] [address], [place], [address] Phone: [phone]   Fax:  [phone]  Physical Therapy Treatment  Patient Details  Name: Ashley Wiley MRN: [id number] Date of Birth: 1976-07-07 No data recorded  Encounter Date: 08/12/2021   PT End of Session - 08/12/21 1113     Visit Number 11    Number of Visits 17    Date for PT Re-Evaluation 08/26/21    PT Start Time 1108    PT Stop Time 1140    PT Time Calculation (min) 32 min    Activity Tolerance Patient tolerated treatment well    Behavior During Therapy WFL for tasks assessed/performed             Past Medical History:  Diagnosis Date   Allergy    Aneurysm ([place])    Anxiety    Back pain    Constipation    Depression    Dermatofibroma    Diabetes mellitus without complication ([place])    Takes Metformin  dx 2016   Facial pain    Facial pain    GERD (gastroesophageal reflux disease)    Headache    Hyperlipidemia    IFG (impaired fasting glucose)    Low HDL (under 40)    Migraine    Obesity    Overweight    Patella-femoral syndrome    Pinguecula    Prediabetes    Thyroid nodule april 2016   Vitamin B12 deficiency    Vitamin D deficiency disease     Past Surgical History:  Procedure Laterality Date   CARPAL TUNNEL RELEASE     GANGLION CYST EXCISION Left    IR 3D INDEPENDENT WKST  12/04/2017   IR ANGIO INTRA EXTRACRAN SEL INTERNAL CAROTID BILAT MOD SED  12/04/2017   IR ANGIO INTRA EXTRACRAN SEL INTERNAL CAROTID BILAT MOD SED  05/17/2021   IR ANGIO INTRA EXTRACRAN SEL INTERNAL CAROTID UNI L MOD SED  03/05/2018   IR ANGIO INTRA EXTRACRAN SEL INTERNAL CAROTID UNI L MOD SED  05/02/2019   IR ANGIO VERTEBRAL SEL VERTEBRAL BILAT MOD SED  12/04/2017   IR ANGIO VERTEBRAL SEL VERTEBRAL UNI R MOD SED  05/17/2021   IR ANGIOGRAM FOLLOW UP STUDY  03/05/2018   IR ANGIOGRAM FOLLOW UP STUDY  03/05/2018   IR ANGIOGRAM FOLLOW UP STUDY  03/05/2018   IR  NEURO EACH ADD'L AFTER BASIC UNI LEFT (MS)  03/05/2018   IR TRANSCATH/EMBOLIZ  03/05/2018   IR US GUIDE VASC ACCESS RIGHT  05/17/2021   NASAL SINUS SURGERY  Nov 2015   RADIOLOGY WITH ANESTHESIA N/A 03/05/2018   Procedure: Arteriogram, coil embolization, possible stenting;  Surgeon: Consuella Lose, MD;  Location: [facility];  Service: Radiology;  Laterality: N/A;   TUBAL LIGATION      There were no vitals filed for this visit.   Subjective Assessment - 08/12/21 1110     Subjective Pt reports to PT after having a migraine and missing Wednesday. She reports her upper back being sore and having 4/10 neck pain this morning.    Pertinent History Pt is a 45 year old female presenting with neck pain with tension headaches over the past few months, getting worse over the past few weeks. Pt also suffers from migraine headaches. 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 Reports pain is usually R side of neck and shoulder, and will give her a tension headache wrapped around the R side  of her head, reporting HA feeling as though it is right behind the R eye. has R hand n/t that comes and goes at random. Her pain is aggravated by paperwork >53mins, sweeping/mopping (looking down), emailing/texting. Pain is burning and tense in nature. Pain is minimally alleviated with ice. Was prescribed a muscle relaxer and reports this minimally helps. Worst pain 10/10; best 4/10; current 6/10. Pt is R handed. Pt denies N/V, B&B changes, unexplained weight fluctuation, saddle paresthesia, fever, night sweats, or unrelenting night pain at this time. No falls in past 6 months    Limitations Reading;Writing;Lifting;House hold activities    How long can you sit comfortably? unilmited    How long can you stand comfortably? unlimited    How long can you walk comfortably? unlimited    Diagnostic tests MRI neuralgia; DDD    Patient Stated Goals Decrease pain            Ther-Ex  Nustep seat 4 UE 5 L3 59min for gentle  protraction/retraction   Bent over Row #5 2 x 10 reps with emphasis on scapular retraction and chin tuck    Standing Posterior delt fly 2 x 10 reps RTB   Standing Shoulder extension red theraband 2 x 10 reps with emphasis on scapular retraction  Tspine rotation on wall x 8 reps with 5 sec hold    Supine Deep neck flexion  hold 3 x 20 sec with cues to tuck chin and lift head about 1-2" from table    Doorway pec stretch 3 x 30sec with education on stretching and "open" posture to prevent tension and bracial plexus irritation here    PT Education - 08/12/21 1112     Education Details therex form/technique    Person(s) Educated Patient    Methods Explanation;Demonstration    Comprehension Verbalized understanding;Returned demonstration              PT Short Term Goals - 08/03/21 1121       PT SHORT TERM GOAL #1   Title Pt will be independent with HEP in order to improve strength and decrease back pain in order to improve pain-free function at home and work.    Baseline 06/29/21 HEP given 08/03/21: pt reports regular participation in HEP    Time 8    Period Weeks    Status Achieved               PT Long Term Goals - 08/03/21 1122       PT LONG TERM GOAL #1   Title Patient will increase FOTO score to 59 to demonstrate predicted increase in functional mobility to complete ADLs    Baseline 06/29/21 42 08/03/21:53    Time 8    Period Weeks    Status New      PT LONG TERM GOAL #2   Title Pt will decrease worst neck pain as reported on NPRS by at least 2 points in order to demonstrate clinically significant reduction in back pain.    Baseline 06/29/21 10/10 08/03/21: 6/10    Time 8    Period Weeks    Status Achieved      PT LONG TERM GOAL #3   Title Pt will demonstrate 4+/5 bilat lower trap strength needed for heavy household ADLs    Baseline 06/29/21 3+/5 bilat 08/03/21: 4/5    Time 8    Period Weeks    Status On-going  Plan - 08/12/21  1114     Clinical Impression Statement Pt tolerated session well with no increase in pain following therapeutic exercise. PT continued periscapular strengthening and deep neck stabilization exercises this session. Pt demonstrated good carry over with PT cueing and demonstration. Patient will continue to benefit from skilled therapy for exercise performance to ensure proper technique for safe and effective.    Personal Factors and Comorbidities Comorbidity 3+;Fitness;Social Background;Past/Current Experience;Time since onset of injury/illness/exacerbation;Sex;Profession;Age    Comorbidities A&D, HLD, migraines, DM2    Examination-Activity Limitations Lift;Carry;Sleep    Examination-Participation Restrictions Driving;Community Activity;Occupation;Cleaning    Stability/Clinical Decision Making Evolving/Moderate complexity    Clinical Decision Making Moderate    Rehab Potential Good    PT Frequency 2x / week    PT Duration 8 weeks    PT Treatment/Interventions ADLs/Self Care Home Management;Therapeutic exercise;Passive range of motion;Electrical Stimulation;Ultrasound;Traction;Moist Heat;Cryotherapy;Iontophoresis 4mg /ml Dexamethasone;DME Instruction;Therapeutic activities;Neuromuscular re-education;Patient/family education;Manual techniques;Dry needling;Spinal Manipulations;Joint Manipulations    PT Next Visit Plan continue POC    PT Home Exercise Plan pec stretch, UT stretch, repeated retractions    Consulted and Agree with Plan of Care Patient             Patient will benefit from skilled therapeutic intervention in order to improve the following deficits and impairments:  Decreased activity tolerance, Decreased endurance, Decreased mobility, Decreased coordination, Increased muscle spasms, Decreased range of motion, Decreased strength, Increased fascial restricitons, Impaired UE functional use, Improper body mechanics, Pain, Postural dysfunction, Impaired flexibility, Impaired tone  Visit  Diagnosis: Cervicalgia     Problem List Patient Active Problem List   Diagnosis Date Noted   Visit for wound check 08/05/2021   Palpitations 07/27/2021   Tachycardia 07/27/2021   Chest pain 07/27/2021   Trigeminal neuralgia 12/13/2018   Type 2 diabetes mellitus with hyperglycemia, without long-term current use of insulin ([place]) 05/29/2018   Migraine without aura and without status migrainosus, not intractable 04/01/2018   Cerebral aneurysm, nonruptured 03/05/2018   Fibroid 02/08/2018   Menstrual migraine without status migrainosus, not intractable 02/08/2018   Menorrhagia with regular cycle 02/08/2018   Acute anxiety 01/13/2016   History of HPV infection 01/13/2016   Elevated ALT measurement 10/05/2015   Allergic rhinitis 07/13/2015   Thyroid nodule 07/13/2015   Obesity 05/14/2015   Hyperlipidemia    Patella-femoral syndrome    Vitamin D deficiency disease    Dermatofibroma    Depression    Pinguecula    Chronic pansinusitis 11/11/2014   Sharion Settler, SPT   Salem Caster. Fairly IV, PT, DPT Physical Therapist- [facility]  08/12/2021, 11:46 AM  [facility] [address], [place], [address] Phone: [phone]   Fax:  [phone]  Name: DARETH ANDREW MRN: [id number] Date of Birth: March 15, 1976

## 2021-08-17 ENCOUNTER — Ambulatory Visit: Payer: No Typology Code available for payment source | Admitting: Physical Therapy

## 2021-08-17 ENCOUNTER — Encounter: Payer: Self-pay | Admitting: Physical Therapy

## 2021-08-17 DIAGNOSIS — M542 Cervicalgia: Secondary | ICD-10-CM | POA: Diagnosis not present

## 2021-08-17 NOTE — Therapy (Signed)
[facility] [address], [place], [address] Phone: [phone]   Fax:  [phone]  Physical Therapy Treatment  Patient Details  Name: Ashley Wiley MRN: [id number] Date of Birth: [birth year] No data recorded  Encounter Date: 08/17/2021   PT End of Session - 08/17/21 1156     Visit Number 12    Number of Visits 17    Date for PT Re-Evaluation 08/26/21    PT Start Time 1122    PT Stop Time 3419    PT Time Calculation (min) 34 min    Activity Tolerance Patient tolerated treatment well    Behavior During Therapy WFL for tasks assessed/performed             Past Medical History:  Diagnosis Date   Allergy    Aneurysm ([place])    Anxiety    Back pain    Constipation    Depression    Dermatofibroma    Diabetes mellitus without complication ([place])    Takes Metformin  dx 2016   Facial pain    Facial pain    GERD (gastroesophageal reflux disease)    Headache    Hyperlipidemia    IFG (impaired fasting glucose)    Low HDL (under 40)    Migraine    Obesity    Overweight    Patella-femoral syndrome    Pinguecula    Prediabetes    Thyroid nodule april 2016   Vitamin B12 deficiency    Vitamin D deficiency disease     Past Surgical History:  Procedure Laterality Date   CARPAL TUNNEL RELEASE     GANGLION CYST EXCISION Left    IR 3D INDEPENDENT WKST  12/04/2017   IR ANGIO INTRA EXTRACRAN SEL INTERNAL CAROTID BILAT MOD SED  12/04/2017   IR ANGIO INTRA EXTRACRAN SEL INTERNAL CAROTID BILAT MOD SED  05/17/2021   IR ANGIO INTRA EXTRACRAN SEL INTERNAL CAROTID UNI L MOD SED  03/05/2018   IR ANGIO INTRA EXTRACRAN SEL INTERNAL CAROTID UNI L MOD SED  05/02/2019   IR ANGIO VERTEBRAL SEL VERTEBRAL BILAT MOD SED  12/04/2017   IR ANGIO VERTEBRAL SEL VERTEBRAL UNI R MOD SED  05/17/2021   IR ANGIOGRAM FOLLOW UP STUDY  03/05/2018   IR ANGIOGRAM FOLLOW UP STUDY  03/05/2018   IR ANGIOGRAM FOLLOW UP STUDY  03/05/2018   IR  NEURO EACH ADD'L AFTER BASIC UNI LEFT (MS)  03/05/2018   IR TRANSCATH/EMBOLIZ  03/05/2018   IR US GUIDE VASC ACCESS RIGHT  05/17/2021   NASAL SINUS SURGERY  Nov 2015   RADIOLOGY WITH ANESTHESIA N/A 03/05/2018   Procedure: Arteriogram, coil embolization, possible stenting;  Surgeon: Consuella Lose, MD;  Location: [facility];  Service: Radiology;  Laterality: N/A;   TUBAL LIGATION      There were no vitals filed for this visit.   Subjective Assessment - 08/17/21 1128     Subjective Pt is feeling well. She reports having a migraine headache yesterday but her neck is feeling "good'. She denies any pain in her neck.    Pertinent History Pt is a 45 year old female presenting with neck pain with tension headaches over the past few months, getting worse over the past few weeks. Pt also suffers from migraine headaches. 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 has R hand n/t that comes and goes at random. Her pain is aggravated by paperwork >26mins, sweeping/mopping (looking down), emailing/texting. Pain is burning and tense in nature. Pain is minimally alleviated with ice. Was prescribed a muscle relaxer and reports this minimally helps. Worst pain 10/10; best 4/10; current 6/10. Pt is R handed. Pt denies N/V, B&B changes, unexplained weight fluctuation, saddle paresthesia, fever, night sweats, or unrelenting night pain at this time. No falls in past 6 months    Limitations Reading;Writing;Lifting;House hold activities    How long can you sit comfortably? unilmited    How long can you stand comfortably? unlimited    How long can you walk comfortably? unlimited    Diagnostic tests MRI neuralgia; DDD    Patient Stated Goals Decrease pain             Therapeutic Exercise   Nu Step L2 x 5 min UE only  Omega Row 3 x 10 #25   with cues for scapular retraction  Lat pull over PVC + chest press 7.5# 2 x 10 reps   W row RTB 2 x 10 reps with emphasis on scapular retraction with good carry over   Thread the needle 2 x 5 reps each direction with 5 sec hold; good carry over following demonstration   Cervical extensor stretch 3 x 30sec                              PT Education - 08/17/21 1201     Education Details plans to d/c next visit    Person(s) Educated Patient    Methods Explanation    Comprehension Verbalized understanding              PT Short Term Goals - 08/03/21 1121       PT SHORT TERM GOAL #1   Title Pt will be independent with HEP in order to improve strength and decrease back pain in order to improve pain-free function at home and work.    Baseline 06/29/21 HEP given 08/03/21: pt reports regular participation in HEP    Time 8    Period Weeks    Status Achieved               PT Long Term Goals - 08/03/21 1122       PT LONG TERM GOAL #1   Title Patient will increase FOTO score to 59 to demonstrate predicted increase in functional mobility to complete ADLs    Baseline 06/29/21 42 08/03/21:53    Time 8    Period Weeks    Status New      PT LONG TERM GOAL #2   Title Pt will decrease worst neck pain as reported on NPRS by at least 2 points in order to demonstrate clinically significant reduction in back pain.    Baseline 06/29/21 10/10 08/03/21: 6/10    Time 8    Period Weeks    Status Achieved      PT LONG TERM GOAL #3   Title Pt will demonstrate 4+/5 bilat lower trap strength needed for heavy household ADLs    Baseline 06/29/21 3+/5 bilat 08/03/21: 4/5    Time 8    Period Weeks    Status On-going                   Plan -  08/17/21 1159     Clinical Impression Statement Pt tolerated session well with no increase in pain following therapeutic exercise. PT continued periscapular strengthening and cervical/thoracic mobility this session. Pt  demonstrated good carry over with PT cueing and demonstration. Patient will continue to benefit from skilled therapy for exercise performance to ensure proper technique for safe and effective. Plan to d/c next visit.    Personal Factors and Comorbidities Comorbidity 3+;Fitness;Social Background;Past/Current Experience;Time since onset of injury/illness/exacerbation;Sex;Profession;Age    Comorbidities A&D, HLD, migraines, DM2    Examination-Activity Limitations Lift;Carry;Sleep    Examination-Participation Restrictions Driving;Community Activity;Occupation;Cleaning    Stability/Clinical Decision Making Evolving/Moderate complexity    Clinical Decision Making Moderate    Rehab Potential Good    PT Frequency 2x / week    PT Duration 8 weeks    PT Treatment/Interventions ADLs/Self Care Home Management;Therapeutic exercise;Passive range of motion;Electrical Stimulation;Ultrasound;Traction;Moist Heat;Cryotherapy;Iontophoresis 4mg /ml Dexamethasone;DME Instruction;Therapeutic activities;Neuromuscular re-education;Patient/family education;Manual techniques;Dry needling;Spinal Manipulations;Joint Manipulations    PT Next Visit Plan d/c with HEP to perform at work    PT [place] stretch, UT stretch, repeated retractions    Consulted and Agree with Plan of Care Patient             Patient will benefit from skilled therapeutic intervention in order to improve the following deficits and impairments:  Decreased activity tolerance, Decreased endurance, Decreased mobility, Decreased coordination, Increased muscle spasms, Decreased range of motion, Decreased strength, Increased fascial restricitons, Impaired UE functional use, Improper body mechanics, Pain, Postural dysfunction, Impaired flexibility, Impaired tone  Visit Diagnosis: Cervicalgia     Problem List Patient Active Problem List   Diagnosis Date Noted   Visit for wound check 08/05/2021   Palpitations 07/27/2021   Tachycardia  07/27/2021   Chest pain 07/27/2021   Trigeminal neuralgia 12/13/2018   Type 2 diabetes mellitus with hyperglycemia, without long-term current use of insulin ([place]) 05/29/2018   Migraine without aura and without status migrainosus, not intractable 04/01/2018   Cerebral aneurysm, nonruptured 03/05/2018   Fibroid 02/08/2018   Menstrual migraine without status migrainosus, not intractable 02/08/2018   Menorrhagia with regular cycle 02/08/2018   Acute anxiety 01/13/2016   History of HPV infection 01/13/2016   Elevated ALT measurement 10/05/2015   Allergic rhinitis 07/13/2015   Thyroid nodule 07/13/2015   Obesity 05/14/2015   Hyperlipidemia    Patella-femoral syndrome    Vitamin D deficiency disease    Dermatofibroma    Depression    Pinguecula    Chronic pansinusitis 11/11/2014     Durwin Reges DPT Sharion Settler, SPT  Durwin Reges, PT 08/17/2021, 2:58 PM  [facility] [address] [place], [address] Phone: [phone]   Fax:  [phone]  Name: MILANA SALAY MRN: [id number] Date of Birth: [birth year]

## 2021-08-19 ENCOUNTER — Ambulatory Visit: Payer: No Typology Code available for payment source | Admitting: Physical Therapy

## 2021-08-22 ENCOUNTER — Other Ambulatory Visit: Payer: Self-pay

## 2021-08-22 MED ORDER — PREDNISONE 10 MG PO TABS
ORAL_TABLET | ORAL | 0 refills | Status: DC
  Filled 2021-08-22: qty 21, 6d supply, fill #0

## 2021-08-23 ENCOUNTER — Other Ambulatory Visit: Payer: Self-pay

## 2021-08-24 ENCOUNTER — Other Ambulatory Visit: Payer: Self-pay

## 2021-08-25 ENCOUNTER — Encounter: Payer: No Typology Code available for payment source | Admitting: Family Medicine

## 2021-08-31 ENCOUNTER — Other Ambulatory Visit: Payer: Self-pay

## 2021-08-31 ENCOUNTER — Ambulatory Visit (INDEPENDENT_AMBULATORY_CARE_PROVIDER_SITE_OTHER): Payer: No Typology Code available for payment source

## 2021-08-31 DIAGNOSIS — R079 Chest pain, unspecified: Secondary | ICD-10-CM | POA: Diagnosis not present

## 2021-08-31 LAB — ECHOCARDIOGRAM COMPLETE
AR max vel: 2.63 cm2
AV Area VTI: 3.07 cm2
AV Area mean vel: 2.77 cm2
AV Mean grad: 3 mmHg
AV Peak grad: 5.2 mmHg
Ao pk vel: 1.14 m/s
Area-P 1/2: 4.86 cm2
Calc EF: 53.7 %
S' Lateral: 3.2 cm
Single Plane A2C EF: 54.7 %
Single Plane A4C EF: 51.9 %

## 2021-09-05 NOTE — Progress Notes (Signed)
Cardiology Office Note:    Date:  09/07/2021   ID:  Ashley Wiley, DOB 1976-09-01, MRN [id number]  PCP:  Valerie Roys, DO  [facility] Cardiologist:  Dr. [facility] Electrophysiologist:  None   Referring MD: Valerie Roys, DO   Chief Complaint: F/u echo/heart monitor  History of Present Illness:    Ashley Wiley is a 45 y.o. female with a hx of HTN, HLD, DM2, cerebral aneurysm s/p coiling 2019 who presents for follow-up for palpitations. Last seen 07/27/21 and reported palpitations after starting fluoxetine. Also reported some chest pressure. Heart monitor and echo were ordered.   Echo showed LVEF 55-60%, no WMA. Heart monitor showed predominately NSR with average ate of 80bpm, rare PACs/PVCs, no sustained arrhythmia or prolonged pause.  Today, the echo and heart monitor reviewed in detail. Still has palpitations, however improved since the prior visit. Some days felt chest tightness, this has improved also Saw PCP and Effexor was started. Feels like anxiety medication has improved symptoms. No shortness of breath.Does have some anxiety, worse at work. No LLE, orthopnea, pnd.BP and HR good today.   Past Medical History:  Diagnosis Date   Allergy    Aneurysm ([place])    Anxiety    Back pain    Constipation    Depression    Dermatofibroma    Diabetes mellitus without complication ([place])    Takes Metformin  dx 2016   Facial pain    Facial pain    GERD (gastroesophageal reflux disease)    Headache    Hyperlipidemia    IFG (impaired fasting glucose)    Low HDL (under 40)    Migraine    Obesity    Overweight    Patella-femoral syndrome    Pinguecula    Prediabetes    Thyroid nodule april 2016   Vitamin B12 deficiency    Vitamin D deficiency disease     Past Surgical History:  Procedure Laterality Date   CARPAL TUNNEL RELEASE     GANGLION CYST EXCISION Left    IR 3D INDEPENDENT WKST  12/04/2017   IR ANGIO INTRA EXTRACRAN SEL INTERNAL CAROTID BILAT  MOD SED  12/04/2017   IR ANGIO INTRA EXTRACRAN SEL INTERNAL CAROTID BILAT MOD SED  05/17/2021   IR ANGIO INTRA EXTRACRAN SEL INTERNAL CAROTID UNI L MOD SED  03/05/2018   IR ANGIO INTRA EXTRACRAN SEL INTERNAL CAROTID UNI L MOD SED  05/02/2019   IR ANGIO VERTEBRAL SEL VERTEBRAL BILAT MOD SED  12/04/2017   IR ANGIO VERTEBRAL SEL VERTEBRAL UNI R MOD SED  05/17/2021   IR ANGIOGRAM FOLLOW UP STUDY  03/05/2018   IR ANGIOGRAM FOLLOW UP STUDY  03/05/2018   IR ANGIOGRAM FOLLOW UP STUDY  03/05/2018   IR NEURO EACH ADD'L AFTER BASIC UNI LEFT (MS)  03/05/2018   IR TRANSCATH/EMBOLIZ  03/05/2018   IR US GUIDE VASC ACCESS RIGHT  05/17/2021   NASAL SINUS SURGERY  Nov 2015   RADIOLOGY WITH ANESTHESIA N/A 03/05/2018   Procedure: Arteriogram, coil embolization, possible stenting;  Surgeon: Consuella Lose, MD;  Location: [place];  Service: Radiology;  Laterality: N/A;   TUBAL LIGATION      Current Medications: Current Meds  Medication Sig   Azelaic Acid 15 % FOAM APPLY TO FACE EVERY MORNING AND WASH OFF EVERY NIGHT AT BEDTIME   busPIRone (BUSPAR) 10 MG tablet Take 1-1.5 tablets (10-15 mg total) by mouth 3 (three) times daily as needed.   fluticasone (FLONASE) 50  MCG/ACT nasal spray PLACE 2 SPRAYS INTO BOTH NOSTRILS DAILY AS NEEDED FOR ALLERGIES.   Galcanezumab-gnlm (EMGALITY) 120 MG/ML SOAJ Inject 120 mg subcutaneously every 28 (twenty-eight) days Start 1 month after loading dose   Galcanezumab-gnlm (EMGALITY) 120 MG/ML SOAJ Inject 240 mg subcutaneously once for 1 dose Initial loading dose   IBUPROFEN PO Take 800 mg by mouth 3 (three) times daily as needed. Advil   loratadine (CLARITIN) 10 MG tablet Take 10 mg by mouth daily as needed for allergies.    metFORMIN (GLUCOPHAGE-XR) 500 MG 24 hr tablet TAKE 1 TABLET BY MOUTH IN THE MORNING AND AT BEDTIME.   naproxen (NAPROSYN) 500 MG tablet Take 1 tablet (500 mg total) by mouth 2 (two) times daily with a meal.   Semaglutide, 1 MG/DOSE, (OZEMPIC, 1 MG/DOSE,) 4 MG/3ML SOPN  Inject 1 mg into the skin once a week.   Tavaborole (KERYDIN) 5 % SOLN Apply to affected toenail nightly   terbinafine (LAMISIL) 250 MG tablet Take 1 tablet (250 mg total) by mouth daily. Take with food.   triamcinolone cream (KENALOG) 0.1 % Apply to affected areas hands 1-2 times a day until improved.   venlafaxine XR (EFFEXOR XR) 37.5 MG 24 hr capsule Take 1 capsule (37.5 mg total) by mouth daily with breakfast.     Allergies:   Imitrex [sumatriptan] and Wellbutrin [bupropion]   Social History   Socioeconomic History   Marital status: Married    Spouse name: Not on file   Number of children: Not on file   Years of education: Not on file   Highest education level: Not on file  Occupational History   Not on file  Tobacco Use   Smoking status: Never   Smokeless tobacco: Never  Vaping Use   Vaping Use: Never used  Substance and Sexual Activity   Alcohol use: Not Currently    Alcohol/week: 0.0 - 1.0 standard drinks    Comment: Drinks 3-4 times per year   Drug use: No   Sexual activity: Yes    Birth control/protection: None  Other Topics Concern   Not on file  Social History Narrative   Not on file   Social Determinants of Health   Financial Resource Strain: Not on file  Food Insecurity: Not on file  Transportation Needs: Not on file  Physical Activity: Not on file  Stress: Not on file  Social Connections: Not on file     Family History: The patient's family history includes Aneurysm in her father; Anxiety disorder in her mother; Depression in her mother; Diabetes in her father and paternal grandfather; Heart attack (age of onset: 30) in her father; Heart disease in her father; Heart failure in her father; Hyperlipidemia in her father; Hypertension in her father; Kidney disease in her father; Stroke in her father. There is no history of Sudden death.  ROS:   Please see the history of present illness.     All other systems reviewed and are negative.  EKGs/Labs/Other  Studies Reviewed:    The following studies were reviewed today:  Echo 08/2021 1. Left ventricular ejection fraction, by estimation, is 55 to 60%. Left  ventricular ejection fraction by 3D volume is 60 %. The left ventricle has  normal function. The left ventricle has no regional wall motion  abnormalities. Left ventricular diastolic   parameters were normal.   2. Right ventricular systolic function is normal. The right ventricular  size is normal.   3. The mitral valve is normal in structure.  No evidence of mitral valve  regurgitation.   4. The aortic valve is tricuspid. Aortic valve regurgitation is not  visualized.   5. The inferior vena cava is normal in size with greater than 50%  respiratory variability, suggesting right atrial pressure of 3 mmHg.   Heart monitor 07/2021 The patient was monitored for 8 days, 22 hours. The predominant rhythm was sinus with an average rate of 80 bpm (range 60-120 bpm). There were rare isolated PACs and PVCs. No sustained arrhythmia or prolonged pause occurred. Patient triggered events correspond to normal sinus rhythm.   Predominantly sinus rhythm with rare isolated PACs and PVCs.  No significant arrhythmia observed to explain patient's symptoms.  EKG:  EKG is not ordered today.    Recent Labs: 05/17/2021: BUN 9; Creatinine, Ser 0.55; Hemoglobin 13.1; Platelets 196; Potassium 3.8; Sodium 137 07/20/2021: ALT 17  Recent Lipid Panel    Component Value Date/Time   CHOL 99 (L) 01/06/2021 0854   TRIG 85 01/06/2021 0854   HDL 47 01/06/2021 0854   LDLCALC 35 01/06/2021 0854     Physical Exam:    VS:  BP 110/70 (BP Location: Left Arm, Patient Position: Sitting, Cuff Size: Normal)   Pulse 71   Ht 5' (1.524 m)   Wt 152 lb (68.9 kg)   SpO2 97%   BMI 29.69 kg/m     Wt Readings from Last 3 Encounters:  09/07/21 152 lb (68.9 kg)  08/05/21 152 lb (68.9 kg)  07/27/21 153 lb (69.4 kg)     GEN:  Well nourished, well developed in no acute  distress HEENT: Normal NECK: No JVD; No carotid bruits LYMPHATICS: No lymphadenopathy CARDIAC: RRR, no murmurs, rubs, gallops RESPIRATORY:  Clear to auscultation without rales, wheezing or rhonchi  ABDOMEN: Soft, non-tender, non-distended MUSCULOSKELETAL:  No edema; No deformity  SKIN: Warm and dry NEUROLOGIC:  Alert and oriented x 3 PSYCHIATRIC:  Normal affect   ASSESSMENT:    1. Palpitations    PLAN:    In order of problems listed above:  Palpitations Heart monitor showed predominately NSR with average rate of 80s with rare PACs/PVCs. Echo showed normal LV function. Patient reports palpitations have improved since the last visit. Seems that starting Effexor has helped symptoms. BP and heart rate good today.  I will check a TSH. Given symptoms are improved would not start a daily CBB/BB. Recommended she come back in if palpitations worsen. She will follow-up as needed.   Disposition: Follow up  PRN  with MD/APP   Signed, Amaree Leeper Ninfa Meeker, PA-C  09/07/2021 9:40 AM    [facility]

## 2021-09-07 ENCOUNTER — Ambulatory Visit (INDEPENDENT_AMBULATORY_CARE_PROVIDER_SITE_OTHER): Payer: No Typology Code available for payment source | Admitting: Medical

## 2021-09-07 ENCOUNTER — Encounter: Payer: Self-pay | Admitting: Medical

## 2021-09-07 ENCOUNTER — Other Ambulatory Visit: Payer: Self-pay

## 2021-09-07 VITALS — BP 110/70 | HR 71 | Ht 60.0 in | Wt 152.0 lb

## 2021-09-07 DIAGNOSIS — R002 Palpitations: Secondary | ICD-10-CM

## 2021-09-07 NOTE — Patient Instructions (Signed)
Medication Instructions:   Your physician recommends that you continue on your current medications as directed. Please refer to the Current Medication list given to you today.  *If you need a refill on your cardiac medications before your next appointment, please call your pharmacy*   Lab Work:  Your physician recommends that you have lab work today TSH  If you have labs (blood work) drawn today and your tests are completely normal, you will receive your results only by: [place] (if you have MyChart) OR A paper copy in the mail If you have any lab test that is abnormal or we need to change your treatment, we will call you to review the results.   Testing/Procedures:  None Ordered   Follow-Up: At [facility], you and your health needs are our priority.  As part of our continuing mission to provide you with exceptional heart care, we have created designated Provider Care Teams.  These Care Teams include your primary Cardiologist (physician) and Advanced Practice Providers (APPs -  Physician Assistants and Nurse Practitioners) who all work together to provide you with the care you need, when you need it.  We recommend signing up for the patient portal called "MyChart".  Sign up information is provided on this After Visit Summary.  MyChart is used to connect with patients for Virtual Visits (Telemedicine).  Patients are able to view lab/test results, encounter notes, upcoming appointments, etc.  Non-urgent messages can be sent to your provider as well.   To learn more about what you can do with MyChart, go to [email or web address].    Your next appointment:  As Needed

## 2021-09-08 LAB — TSH: TSH: 2.09 u[IU]/mL (ref 0.450–4.500)

## 2021-09-15 ENCOUNTER — Other Ambulatory Visit (HOSPITAL_COMMUNITY): Payer: Self-pay

## 2021-09-16 ENCOUNTER — Other Ambulatory Visit: Payer: Self-pay

## 2021-09-19 ENCOUNTER — Other Ambulatory Visit: Payer: Self-pay

## 2021-09-19 ENCOUNTER — Encounter: Payer: Self-pay | Admitting: Dermatology

## 2021-09-19 MED FILL — Metformin HCl Tab ER 24HR 500 MG: ORAL | 90 days supply | Qty: 180 | Fill #0 | Status: AC

## 2021-09-20 ENCOUNTER — Ambulatory Visit: Payer: No Typology Code available for payment source | Admitting: Dermatology

## 2021-10-05 ENCOUNTER — Encounter: Payer: Self-pay | Admitting: Family Medicine

## 2021-10-05 ENCOUNTER — Other Ambulatory Visit: Payer: Self-pay

## 2021-10-05 ENCOUNTER — Ambulatory Visit (INDEPENDENT_AMBULATORY_CARE_PROVIDER_SITE_OTHER): Payer: No Typology Code available for payment source | Admitting: Family Medicine

## 2021-10-05 VITALS — BP 126/84 | HR 84 | Ht 60.0 in | Wt 153.0 lb

## 2021-10-05 DIAGNOSIS — R1030 Lower abdominal pain, unspecified: Secondary | ICD-10-CM | POA: Diagnosis not present

## 2021-10-05 LAB — CBC WITH DIFFERENTIAL/PLATELET
Hematocrit: 41 % (ref 34.0–46.6)
Hemoglobin: 14.4 g/dL (ref 11.1–15.9)
Lymphocytes Absolute: 2.3 10*3/uL (ref 0.7–3.1)
Lymphs: 25 %
MCH: 30.1 pg (ref 26.6–33.0)
MCHC: 35.1 g/dL (ref 31.5–35.7)
MCV: 86 fL (ref 79–97)
MID (Absolute): 0.5 10*3/uL (ref 0.1–1.6)
MID: 6 %
Neutrophils Absolute: 6.2 10*3/uL (ref 1.4–7.0)
Neutrophils: 69 %
Platelets: 252 10*3/uL (ref 150–450)
RBC: 4.78 x10E6/uL (ref 3.77–5.28)
RDW: 14.5 % (ref 11.7–15.4)
WBC: 9 10*3/uL (ref 3.4–10.8)

## 2021-10-05 LAB — PREGNANCY, URINE: Preg Test, Ur: NEGATIVE

## 2021-10-05 LAB — URINALYSIS, ROUTINE W REFLEX MICROSCOPIC
Bilirubin, UA: NEGATIVE
Glucose, UA: NEGATIVE
Ketones, UA: NEGATIVE
Leukocytes,UA: NEGATIVE
Nitrite, UA: NEGATIVE
RBC, UA: NEGATIVE
Specific Gravity, UA: 1.02 (ref 1.005–1.030)
Urobilinogen, Ur: 0.2 mg/dL (ref 0.2–1.0)
pH, UA: 7 (ref 5.0–7.5)

## 2021-10-05 NOTE — Progress Notes (Signed)
BP 126/84   Pulse 84   Ht 5' (1.524 m)   Wt 153 lb (69.4 kg)   SpO2 100%   BMI 29.88 kg/m    Subjective:    Patient ID: Ashley Wiley, female    DOB: July 25, 1976, 45 y.o.   MRN: [id number]  HPI: Ashley Wiley is a 45 y.o. female  Chief Complaint  Patient presents with   Abdominal Pain   ABDOMINAL PAIN  Duration: 2 weeks Onset: sudden Severity: severe Quality: stabbing, shooting Location:  suprapubic". "lower abdominal quadrants  Episode duration: couple of seconds Radiation: no Frequency: intermittent Status: stable Treatments attempted: water, NSAIDs Fever: no Nausea: no Vomiting: no Weight loss: no Decreased appetite: no Diarrhea: no Constipation: no Blood in stool: no Heartburn: no Jaundice: no Rash: no Dysuria/urinary frequency: no Hematuria: no History of sexually transmitted disease: no Recurrent NSAID use: no   Relevant past medical, surgical, family and social history reviewed and updated as indicated. Interim medical history since our last visit reviewed. Allergies and medications reviewed and updated.  Review of Systems  Constitutional: Negative.   Respiratory: Negative.    Cardiovascular: Negative.   Gastrointestinal:  Positive for abdominal pain. Negative for abdominal distention, anal bleeding, blood in stool, constipation, diarrhea, nausea, rectal pain and vomiting.  Musculoskeletal: Negative.   Skin: Negative.   Neurological: Negative.   Psychiatric/Behavioral: Negative.     Per HPI unless specifically indicated above     Objective:    BP 126/84   Pulse 84   Ht 5' (1.524 m)   Wt 153 lb (69.4 kg)   SpO2 100%   BMI 29.88 kg/m   Wt Readings from Last 3 Encounters:  10/05/21 153 lb (69.4 kg)  09/07/21 152 lb (68.9 kg)  08/05/21 152 lb (68.9 kg)    Physical Exam Vitals and nursing note reviewed.  Constitutional:      General: She is not in acute distress.    Appearance: Normal appearance. She is not ill-appearing,  toxic-appearing or diaphoretic.  HENT:     Head: Normocephalic and atraumatic.     Right Ear: External ear normal.     Left Ear: External ear normal.     Nose: Nose normal.     Mouth/Throat:     Mouth: Mucous membranes are moist.     Pharynx: Oropharynx is clear.  Eyes:     General: No scleral icterus.       Right eye: No discharge.        Left eye: No discharge.     Extraocular Movements: Extraocular movements intact.     Conjunctiva/sclera: Conjunctivae normal.     Pupils: Pupils are equal, round, and reactive to light.  Cardiovascular:     Rate and Rhythm: Normal rate and regular rhythm.     Pulses: Normal pulses.     Heart sounds: Normal heart sounds. No murmur heard.   No friction rub. No gallop.  Pulmonary:     Effort: Pulmonary effort is normal. No respiratory distress.     Breath sounds: Normal breath sounds. No stridor. No wheezing, rhonchi or rales.  Chest:     Chest wall: No tenderness.  Abdominal:     General: Abdomen is flat. Bowel sounds are normal.     Palpations: Abdomen is soft.     Tenderness: There is abdominal tenderness in the left lower quadrant.  Musculoskeletal:        General: Normal range of motion.     Cervical back: Normal  range of motion and neck supple.  Skin:    General: Skin is warm and dry.     Capillary Refill: Capillary refill takes less than 2 seconds.     Coloration: Skin is not jaundiced or pale.     Findings: No bruising, erythema, lesion or rash.  Neurological:     General: No focal deficit present.     Mental Status: She is alert and oriented to person, place, and time. Mental status is at baseline.  Psychiatric:        Mood and Affect: Mood normal.        Behavior: Behavior normal.        Thought Content: Thought content normal.        Judgment: Judgment normal.    Results for orders placed or performed in visit on 10/05/21  CBC With Differential/Platelet  Result Value Ref Range   WBC 9.0 3.4 - 10.8 x10E3/uL   RBC 4.78 3.77  - 5.28 x10E6/uL   Hemoglobin 14.4 11.1 - 15.9 g/dL   Hematocrit 41.0 34.0 - 46.6 %   MCV 86 79 - 97 fL   MCH 30.1 26.6 - 33.0 pg   MCHC 35.1 31.5 - 35.7 g/dL   RDW 14.5 11.7 - 15.4 %   Platelets 252 150 - 450 x10E3/uL   Neutrophils 69 Not Estab. %   Lymphs 25 Not Estab. %   MID 6 Not Estab. %   Neutrophils Absolute 6.2 1.4 - 7.0 x10E3/uL   Lymphocytes Absolute 2.3 0.7 - 3.1 x10E3/uL   MID (Absolute) 0.5 0.1 - 1.6 X10E3/uL  Urinalysis, Routine w reflex microscopic  Result Value Ref Range   Specific Gravity, UA 1.020 1.005 - 1.030   pH, UA 7.0 5.0 - 7.5   Color, UA Yellow Yellow   Appearance Ur Clear Clear   Leukocytes,UA Negative Negative   Protein,UA Trace (A) Negative/Trace   Glucose, UA Negative Negative   Ketones, UA Negative Negative   RBC, UA Negative Negative   Bilirubin, UA Negative Negative   Urobilinogen, Ur 0.2 0.2 - 1.0 mg/dL   Nitrite, UA Negative Negative  Pregnancy, urine  Result Value Ref Range   Preg Test, Ur Negative Negative      Assessment & Plan:   Problem List Items Addressed This Visit   None Visit Diagnoses     Lower abdominal pain    -  Primary   UA, CBC and pregnancy normal/negative. Will obtain US to look for ovarian cyst or fibroid. Await results. Treat as needed.    Relevant Orders   CBC With Differential/Platelet (Completed)   Urinalysis, Routine w reflex microscopic   US Pelvic Complete With Transvaginal   Pregnancy, urine        Follow up plan: Return if symptoms worsen or fail to improve.

## 2021-10-06 ENCOUNTER — Encounter: Payer: Self-pay | Admitting: Family Medicine

## 2021-10-10 ENCOUNTER — Encounter: Payer: Self-pay | Admitting: Family Medicine

## 2021-10-10 ENCOUNTER — Other Ambulatory Visit: Payer: Self-pay | Admitting: Student

## 2021-10-10 ENCOUNTER — Other Ambulatory Visit (HOSPITAL_COMMUNITY): Payer: Self-pay | Admitting: Student

## 2021-10-10 DIAGNOSIS — G43719 Chronic migraine without aura, intractable, without status migrainosus: Secondary | ICD-10-CM

## 2021-10-10 DIAGNOSIS — G43811 Other migraine, intractable, with status migrainosus: Secondary | ICD-10-CM

## 2021-10-11 ENCOUNTER — Ambulatory Visit
Admission: RE | Admit: 2021-10-11 | Discharge: 2021-10-11 | Disposition: A | Discharge status: Home / Self Care | Payer: No Typology Code available for payment source | Source: Ambulatory Visit | Attending: Family Medicine | Admitting: Family Medicine

## 2021-10-11 ENCOUNTER — Other Ambulatory Visit: Payer: Self-pay

## 2021-10-11 DIAGNOSIS — R1030 Lower abdominal pain, unspecified: Secondary | ICD-10-CM | POA: Diagnosis present

## 2021-10-18 ENCOUNTER — Other Ambulatory Visit: Payer: Self-pay

## 2021-10-18 ENCOUNTER — Ambulatory Visit: Payer: No Typology Code available for payment source | Admitting: Dermatology

## 2021-10-18 DIAGNOSIS — L811 Chloasma: Secondary | ICD-10-CM | POA: Diagnosis not present

## 2021-10-18 DIAGNOSIS — B351 Tinea unguium: Secondary | ICD-10-CM | POA: Diagnosis not present

## 2021-10-18 MED ORDER — TAVABOROLE 5 % EX SOLN: CUTANEOUS | 3 refills | Status: DC

## 2021-10-18 NOTE — Patient Instructions (Addendum)
Recommend taking Heliocare sun protection supplement daily in sunny weather for additional sun protection. For maximum protection on the sunniest days, you can take up to 2 capsules of regular Heliocare OR take 1 capsule of Heliocare Ultra. For prolonged exposure (such as a full day in the sun), you can repeat your dose of the supplement 4 hours after your first dose. Heliocare can be purchased at [facility] or at [email or web address].    Recommend daily broad spectrum sunscreen SPF 30+ to sun-exposed areas, reapply every 2 hours as needed. Call for new or changing lesions.  Staying in the shade or wearing long sleeves, sun glasses (UVA+UVB protection) and wide brim hats (4-inch brim around the entire circumference of the hat) are also recommended for sun protection.       If You Need Anything After Your Visit  If you have any questions or concerns for your doctor, please call our main line at [phone] and press option 4 to reach your doctor's medical assistant. If no one answers, please leave a voicemail as directed and we will return your call as soon as possible. Messages left after 4 pm will be answered the following business day.   You may also send [place] a message via [place]. We typically respond to MyChart messages within 1-2 business days.  For prescription refills, please ask your pharmacy to contact our office. Our fax number is [phone].  If you have an urgent issue when the clinic is closed that cannot wait until the next business day, you can page your doctor at the number below.    Please note that while we do our best to be available for urgent issues outside of office hours, we are not available 24/7.   If you have an urgent issue and are unable to reach [place], you may choose to seek medical care at your doctor's office, retail clinic, urgent care center, or emergency room.  If you have a medical emergency, please immediately call 911 or go to the emergency  department.  Pager Numbers  - Dr. Nehemiah Massed: [phone]  - Dr. Laurence Ferrari: [phone]  - Dr. Nicole Kindred: [phone]  In the event of inclement weather, please call our main line at [phone] for an update on the status of any delays or closures.  Dermatology Medication Tips: Please keep the boxes that topical medications come in in order to help keep track of the instructions about where and how to use these. Pharmacies typically print the medication instructions only on the boxes and not directly on the medication tubes.   If your medication is too expensive, please contact our office at [phone] option 4 or send [place] a message through [place].   We are unable to tell what your co-pay for medications will be in advance as this is different depending on your insurance coverage. However, we may be able to find a substitute medication at lower cost or fill out paperwork to get insurance to cover a needed medication.   If a prior authorization is required to get your medication covered by your insurance company, please allow [place] 1-2 business days to complete this process.  Drug prices often vary depending on where the prescription is filled and some pharmacies may offer cheaper prices.  The website www.goodrx.com contains coupons for medications through different pharmacies. The prices here do not account for what the cost may be with help from insurance (it may be cheaper with your insurance), but the website can give you the price if you  did not use any insurance.  - You can print the associated coupon and take it with your prescription to the pharmacy.  - You may also stop by our office during regular business hours and pick up a GoodRx coupon card.  - If you need your prescription sent electronically to a different pharmacy, notify our office through [place] or by phone at [phone] option 4.     Si Usted Necesita Algo Despus de Su Visita  Tambin puede enviarnos un  mensaje a travs de Pharmacist, community. Por lo general respondemos a los mensajes de MyChart en el transcurso de 1 a 2 das hbiles.  Para renovar recetas, por favor pida a su farmacia que se ponga en contacto con nuestra oficina. Harland Dingwall de fax es [place] [phone].  Si tiene un asunto urgente cuando la clnica est cerrada y que no puede esperar hasta el siguiente da hbil, puede llamar/localizar a su doctor(a) al nmero que aparece a continuacin.   Por favor, tenga en cuenta que aunque hacemos todo lo posible para estar disponibles para asuntos urgentes fuera del horario de [place], no estamos disponibles las 24 horas del da, los 7 das de la [place].   Si tiene un problema urgente y no puede comunicarse con nosotros, puede optar por buscar atencin mdica  en el consultorio de su doctor(a), en una clnica privada, en un centro de atencin urgente o en una sala de emergencias.  Si tiene Engineering geologist, por favor llame inmediatamente al 911 o vaya a la sala de emergencias.  Nmeros de bper  - Dr. Nehemiah Massed: [phone]  - Dra. Moye: [phone]  - Dra. Nicole Kindred: [phone]  En caso de inclemencias del [place], por favor llame a Johnsie Kindred principal al [phone] para una actualizacin sobre el [place] de cualquier retraso o cierre.  Consejos para la medicacin en dermatologa: Por favor, guarde las cajas en las que vienen los medicamentos de uso tpico para ayudarle a seguir las instrucciones sobre dnde y cmo usarlos. Las farmacias generalmente imprimen las instrucciones del medicamento slo en las cajas y no directamente en los tubos del [place].   Si su medicamento es muy caro, por favor, pngase en contacto con Zigmund Daniel llamando al [phone] y presione la opcin 4 o envenos un mensaje a travs de Pharmacist, community.   No podemos decirle cul ser su copago por los medicamentos por adelantado ya que esto es diferente dependiendo de la cobertura de su seguro. Sin embargo,  es posible que podamos encontrar un medicamento sustituto a Electrical engineer un formulario para que el seguro cubra el medicamento que se considera necesario.   Si se requiere una autorizacin previa para que su compaa de seguros [place] su medicamento, por favor permtanos de 1 a 2 das hbiles para completar este proceso.  Los precios de los medicamentos varan con frecuencia dependiendo del Environmental consultant de dnde se surte la receta y alguna farmacias pueden ofrecer precios ms baratos.  El sitio web www.goodrx.com tiene cupones para medicamentos de Airline pilot. Los precios aqu no tienen en cuenta lo que podra costar con la ayuda del seguro (puede ser ms barato con su seguro), pero el sitio web puede darle el precio si no utiliz Research scientist (physical sciences).  - Puede imprimir el cupn correspondiente y llevarlo con su receta a la farmacia.  - Tambin puede pasar por nuestra oficina durante el horario de atencin regular y Charity fundraiser una tarjeta de cupones de GoodRx.  - Si necesita que su receta se  enve electrnicamente a una farmacia diferente, informe a nuestra oficina a travs de MyChart de [place] o por telfono llamando al [phone] y presione la opcin 4.  

## 2021-10-18 NOTE — Progress Notes (Signed)
Follow-Up Visit   Subjective  Ashley Wiley is a 45 y.o. female who presents for the following: Follow-up (Patient here today for 2 month follow up on melasma and tinea unguium at left great toenail. Patient states toenail has improved and finished lamisil oral medication and is continue to use kerydin. She request a refill of Kerydin. Patient still reports some redness and discoloration and mid face. She reports more sensitivity to sunlight even when wearing sunscreen. ).  She is getting more red on cheeks.  She ran out of the Heliocare Ultra about 3 weeks ago, and she noticed darkening since she stopped.  Patient reports stopping oral tranexamic acid last month due to preventative health reasons (h/o aneurysm). Patient had tried and fail inkey TXA cream and SM 12% HQ cream.  Feels like the the current SM HQ cream with tretinoin and steroid works the best.  Not using the Finacea foam.   The following portions of the chart were reviewed this encounter and updated as appropriate:      Review of Systems: No other skin or systemic complaints except as noted in HPI or Assessment and Plan.   Objective  Well appearing patient in no apparent distress; mood and affect are within normal limits.  A focused examination was performed including face, and left toenails. Relevant physical exam findings are noted in the Assessment and Plan.  B/l zygoma,upper lip, upper eyebrows Erythema bilateral cheeks , reticulated hyperpigmentation BL zygoma with some darkening when compared to previous photos         Left great toenail Mild dystrophy and discoloration, improved when compared to baseline photo       Assessment & Plan  Melasma B/l zygoma,upper lip, upper eyebrows  Chronic condition with duration or expected duration over one year. Condition is bothersome to patient. Currently flared. With recent darkening  Melasma is a chronic condition of persistent pigmented patches generally on  the face, worse in summer due to higher UV exposure.  Oral estrogen containing BCPs or supplements can exacerbate condition.  Recommend daily broad spectrum tinted sunscreen SPF 30+ to face, preferably with Zinc or Titanium Dioxide. Discussed Rx topical bleaching creams (i.e. hydroquinone), OTC HelioCare supplement, chemical peels (would need multiple for best result).     Restart Finacea foam apply 2 times daily Recommend UV tint for side windows for car Continue with spf / Elta MD tinted qam, reapply as needed during day restart Heliocare Ultra Start topical Vit C serum qd/bid Recommend break from Hydroquinone over wintertime, also erythema may be due to irritation from tretinoin with cooler weather change  Hold during winter months- [employer] Hydroquinone 8%, Tretinoin 0.1%, Kojic acid 1%, Niacinamide 4%, Fluocinolone 0.025% cream, a pea sized amount nightly to dark spots on face for up to 2 months. This cannot be used more than 3 months due to risk of skin atrophy (thinning) and exogenous ochronosis (permanent dark spots).    Related Medications Azelaic Acid 15 % FOAM APPLY TO FACE EVERY MORNING AND WASH OFF EVERY NIGHT AT BEDTIME  Tinea unguium Left great toenail  Chronic and persistent, s/p 4 mo course Lamisil PO  Improvement today when compared to prior photos   Continue Kerydin qd. Refills sent to [facility]   Tavaborole [facility]) 5 % SOLN - Left great toenail Apply to affected toenail nightly  Return in about 4 months (around 02/15/2022) for 4 month melasma and tinea . I, Ruthell Rummage, CMA, am acting as scribe for Brendolyn Patty, MD.  Documentation: I have reviewed the above documentation for accuracy and completeness, and I agree with the above.  Brendolyn Patty MD

## 2021-10-19 ENCOUNTER — Other Ambulatory Visit: Payer: Self-pay

## 2021-10-19 ENCOUNTER — Encounter: Payer: Self-pay | Admitting: Family Medicine

## 2021-10-19 ENCOUNTER — Ambulatory Visit (INDEPENDENT_AMBULATORY_CARE_PROVIDER_SITE_OTHER): Payer: No Typology Code available for payment source | Admitting: Family Medicine

## 2021-10-19 ENCOUNTER — Ambulatory Visit: Admission: RE | Admit: 2021-10-19 | Payer: No Typology Code available for payment source | Source: Ambulatory Visit

## 2021-10-19 VITALS — BP 120/80 | HR 78 | Temp 98.5°F | Ht 60.0 in | Wt 154.5 lb

## 2021-10-19 DIAGNOSIS — E782 Mixed hyperlipidemia: Secondary | ICD-10-CM | POA: Diagnosis not present

## 2021-10-19 DIAGNOSIS — R252 Cramp and spasm: Secondary | ICD-10-CM | POA: Diagnosis not present

## 2021-10-19 DIAGNOSIS — F332 Major depressive disorder, recurrent severe without psychotic features: Secondary | ICD-10-CM

## 2021-10-19 DIAGNOSIS — E559 Vitamin D deficiency, unspecified: Secondary | ICD-10-CM

## 2021-10-19 DIAGNOSIS — E1165 Type 2 diabetes mellitus with hyperglycemia: Secondary | ICD-10-CM

## 2021-10-19 DIAGNOSIS — Z0000 Encounter for general adult medical examination without abnormal findings: Secondary | ICD-10-CM | POA: Diagnosis not present

## 2021-10-19 DIAGNOSIS — Z1231 Encounter for screening mammogram for malignant neoplasm of breast: Secondary | ICD-10-CM

## 2021-10-19 LAB — URINALYSIS, ROUTINE W REFLEX MICROSCOPIC
Bilirubin, UA: NEGATIVE
Glucose, UA: NEGATIVE
Ketones, UA: NEGATIVE
Leukocytes,UA: NEGATIVE
Nitrite, UA: NEGATIVE
Specific Gravity, UA: 1.015 (ref 1.005–1.030)
Urobilinogen, Ur: 0.2 mg/dL (ref 0.2–1.0)
pH, UA: 7.5 (ref 5.0–7.5)

## 2021-10-19 LAB — MICROSCOPIC EXAMINATION: WBC, UA: NONE SEEN /hpf (ref 0–5)

## 2021-10-19 LAB — BAYER DCA HB A1C WAIVED: HB A1C (BAYER DCA - WAIVED): 5.4 % (ref 4.8–5.6)

## 2021-10-19 MED ORDER — METFORMIN HCL ER 500 MG PO TB24
ORAL_TABLET | ORAL | 1 refills | Status: DC
Start: 2021-10-19 — End: 2022-04-19
  Filled 2021-10-19: qty 180, 90d supply, fill #0

## 2021-10-19 MED ORDER — NAPROXEN 500 MG PO TABS
500.0000 mg | ORAL_TABLET | Freq: Two times a day (BID) | ORAL | 1 refills | Status: DC
  Filled 2021-10-19: qty 180, 90d supply, fill #0

## 2021-10-19 MED ORDER — OZEMPIC (1 MG/DOSE) 4 MG/3ML ~~LOC~~ SOPN
1.0000 mg | PEN_INJECTOR | SUBCUTANEOUS | 6 refills | Status: DC
  Filled 2021-10-19: qty 3, 28d supply, fill #0
  Filled 2021-12-07: qty 3, 28d supply, fill #1
  Filled 2022-01-25: qty 3, 28d supply, fill #2
  Filled 2022-03-13: qty 9, 84d supply, fill #3

## 2021-10-19 MED ORDER — BUSPIRONE HCL 10 MG PO TABS
10.0000 mg | ORAL_TABLET | Freq: Three times a day (TID) | ORAL | 1 refills | Status: DC | PRN
  Filled 2021-10-19: qty 270, 60d supply, fill #0

## 2021-10-19 NOTE — Progress Notes (Signed)
BP 120/80   Pulse 78   Temp 98.5 F (36.9 C) (Oral)   Ht 5' (1.524 m)   Wt 154 lb 8 oz (70.1 kg)   SpO2 99%   BMI 30.17 kg/m    Subjective:    Patient ID: Ashley Wiley, female    DOB: 1976-09-16, 45 y.o.   MRN: [id number]  HPI: Ashley Wiley is a 45 y.o. female presenting on 10/19/2021 for comprehensive medical examination. Current medical complaints include:  DIABETES Hypoglycemic episodes:no Polydipsia/polyuria: no Visual disturbance: no Chest pain: no Paresthesias: no Glucose Monitoring: no  Accucheck frequency: Not Checking Taking Insulin?: no Blood Pressure Monitoring: not checking Retinal Examination: Up to Date Foot Exam: Up to Date Diabetic Education: Completed Pneumovax: Up to Date Influenza: Up to Date Aspirin: yes  DEPRESSION- better. Side effects better since coming off effexor.  Mood status: better Satisfied with current treatment?: yes Symptom severity: mild  Duration of current treatment : chronic Side effects: no Medication compliance: excellent compliance Psychotherapy/counseling: yes, in the past  Previous psychiatric medications: effexor Depressed mood: no Anxious mood: yes Anhedonia: no Significant weight loss or gain: no Insomnia: no  Fatigue: yes Feelings of worthlessness or guilt: no Impaired concentration/indecisiveness: no Suicidal ideations: no Hopelessness: no Crying spells: no Depression screen [facility] 2/9 10/19/2021 07/06/2021 01/06/2021 09/24/2020 07/11/2020  Decreased Interest 0 0 0 1 2  Down, Depressed, Hopeless 0 1 1 1 1   PHQ - 2 Score 0 1 1 2 3   Altered sleeping 2 1 0 1 3  Tired, decreased energy 0 0 0 3 3  Change in appetite 0 0 0 1 0  Feeling bad or failure about yourself  0 0 1 1 0  Trouble concentrating 0 1 1 1  0  Moving slowly or fidgety/restless 0 0 0 1 1  Suicidal thoughts 0 0 0 0 0  PHQ-9 Score 2 3 3 10 10   Difficult doing work/chores Not difficult at all Somewhat difficult Not difficult at all Not difficult  at all Somewhat difficult  Some recent data might be hidden   LEG CRAMPS Duration: months Pain: yes Severity: moderate  Quality:  aching Location:  lower legs Bilateral:  yes Onset: sudden Frequency: constant but waxing and waning Time of  day:   at random Sudden unintentional leg jerking:   no Paresthesias:   no Decreased sensation:  no Weakness:   no Insomnia:   no Fatigue:   yes Alleviating factors: nothing Aggravating factors: nothing Status: worse  She currently lives with: husband and kids Menopausal Symptoms: no  Depression Screen done today and results listed below:  Depression screen [facility] 2/9 10/19/2021 07/06/2021 01/06/2021 09/24/2020 07/11/2020  Decreased Interest 0 0 0 1 2  Down, Depressed, Hopeless 0 1 1 1 1   PHQ - 2 Score 0 1 1 2 3   Altered sleeping 2 1 0 1 3  Tired, decreased energy 0 0 0 3 3  Change in appetite 0 0 0 1 0  Feeling bad or failure about yourself  0 0 1 1 0  Trouble concentrating 0 1 1 1  0  Moving slowly or fidgety/restless 0 0 0 1 1  Suicidal thoughts 0 0 0 0 0  PHQ-9 Score 2 3 3 10 10   Difficult doing work/chores Not difficult at all Somewhat difficult Not difficult at all Not difficult at all Somewhat difficult  Some recent data might be hidden    Past Medical History:  Past Medical History:  Diagnosis Date  Allergy    Aneurysm (HCC)    Anxiety    Back pain    Constipation    Depression    Dermatofibroma    Diabetes mellitus without complication ([place])    Takes Metformin  dx 2016   Facial pain    Facial pain    GERD (gastroesophageal reflux disease)    Headache    Hyperlipidemia    IFG (impaired fasting glucose)    Low HDL (under 40)    Migraine    Obesity    Overweight    Patella-femoral syndrome    Pinguecula    Prediabetes    Thyroid nodule april 2016   Vitamin B12 deficiency    Vitamin D deficiency disease     Surgical History:  Past Surgical History:  Procedure Laterality Date   CARPAL TUNNEL RELEASE      GANGLION CYST EXCISION Left    IR 3D INDEPENDENT WKST  12/04/2017   IR ANGIO INTRA EXTRACRAN SEL INTERNAL CAROTID BILAT MOD SED  12/04/2017   IR ANGIO INTRA EXTRACRAN SEL INTERNAL CAROTID BILAT MOD SED  05/17/2021   IR ANGIO INTRA EXTRACRAN SEL INTERNAL CAROTID UNI L MOD SED  03/05/2018   IR ANGIO INTRA EXTRACRAN SEL INTERNAL CAROTID UNI L MOD SED  05/02/2019   IR ANGIO VERTEBRAL SEL VERTEBRAL BILAT MOD SED  12/04/2017   IR ANGIO VERTEBRAL SEL VERTEBRAL UNI R MOD SED  05/17/2021   IR ANGIOGRAM FOLLOW UP STUDY  03/05/2018   IR ANGIOGRAM FOLLOW UP STUDY  03/05/2018   IR ANGIOGRAM FOLLOW UP STUDY  03/05/2018   IR NEURO EACH ADD'L AFTER BASIC UNI LEFT (MS)  03/05/2018   IR TRANSCATH/EMBOLIZ  03/05/2018   IR US GUIDE VASC ACCESS RIGHT  05/17/2021   NASAL SINUS SURGERY  Nov 2015   RADIOLOGY WITH ANESTHESIA N/A 03/05/2018   Procedure: Arteriogram, coil embolization, possible stenting;  Surgeon: Consuella Lose, MD;  Location: [place];  Service: Radiology;  Laterality: N/A;   TUBAL LIGATION      Medications:  Current Outpatient Medications on File Prior to Visit  Medication Sig   Azelaic Acid 15 % FOAM APPLY TO FACE EVERY MORNING AND WASH OFF EVERY NIGHT AT BEDTIME   fluticasone (FLONASE) 50 MCG/ACT nasal spray PLACE 2 SPRAYS INTO BOTH NOSTRILS DAILY AS NEEDED FOR ALLERGIES.   Galcanezumab-gnlm (EMGALITY) 120 MG/ML SOAJ Inject 120 mg subcutaneously every 28 (twenty-eight) days Start 1 month after loading dose   Galcanezumab-gnlm (EMGALITY) 120 MG/ML SOAJ Inject 240 mg subcutaneously once for 1 dose Initial loading dose   IBUPROFEN PO Take 800 mg by mouth 3 (three) times daily as needed. Advil   loratadine (CLARITIN) 10 MG tablet Take 10 mg by mouth daily as needed for allergies.    Tavaborole (KERYDIN) 5 % SOLN Apply to affected toenail nightly   triamcinolone cream (KENALOG) 0.1 % Apply to affected areas hands 1-2 times a day until improved.   No current facility-administered medications on file prior to  visit.    Allergies:  Allergies  Allergen Reactions   Imitrex [Sumatriptan] Other (See Comments)    Chest pain    Wellbutrin [Bupropion] Other (See Comments)    Increased anxiety and tremors    Social History:  Social History   Socioeconomic History   Marital status: Married    Spouse name: Not on file   Number of children: Not on file   Years of education: Not on file   Highest education level: Not on file  Occupational History   Not on file  Tobacco Use   Smoking status: Never   Smokeless tobacco: Never  Vaping Use   Vaping Use: Never used  Substance and Sexual Activity   Alcohol use: Not Currently    Alcohol/week: 0.0 - 1.0 standard drinks    Comment: Drinks 3-4 times per year   Drug use: No   Sexual activity: Yes    Birth control/protection: None  Other Topics Concern   Not on file  Social History Narrative   Not on file   Social Determinants of Health   Financial Resource Strain: Not on file  Food Insecurity: Not on file  Transportation Needs: Not on file  Physical Activity: Not on file  Stress: Not on file  Social Connections: Not on file  Intimate Partner Violence: Not on file   Social History   Tobacco Use  Smoking Status Never  Smokeless Tobacco Never   Social History   Substance and Sexual Activity  Alcohol Use Not Currently   Alcohol/week: 0.0 - 1.0 standard drinks   Comment: Drinks 3-4 times per year    Family History:  Family History  Problem Relation Age of Onset   Depression Mother    Anxiety disorder Mother    Heart attack Father 11   Heart failure Father    Diabetes Father    Hypertension Father    Heart disease Father    Kidney disease Father    Stroke Father    Aneurysm Father    Hyperlipidemia Father    Diabetes Paternal Grandfather    Sudden death Neg Hx     Past medical history, surgical history, medications, allergies, family history and social history reviewed with patient today and changes made to appropriate  areas of the chart.   Review of Systems  Constitutional: Negative.   HENT: Negative.    Eyes: Negative.        Twitching eye  Respiratory: Negative.    Cardiovascular:  Positive for leg swelling (small amount with being on her feet all day). Negative for chest pain, palpitations, orthopnea, claudication and PND.  Gastrointestinal: Negative.   Genitourinary: Negative.   Musculoskeletal:  Positive for myalgias. Negative for back pain, falls, joint pain and neck pain.  Skin: Negative.   Neurological:  Positive for headaches. Negative for dizziness, tingling, tremors, sensory change, speech change, focal weakness, seizures, loss of consciousness and weakness.  Endo/Heme/Allergies: Negative.   Psychiatric/Behavioral: Negative.    All other ROS negative except what is listed above and in the HPI.      Objective:    BP 120/80   Pulse 78   Temp 98.5 F (36.9 C) (Oral)   Ht 5' (1.524 m)   Wt 154 lb 8 oz (70.1 kg)   SpO2 99%   BMI 30.17 kg/m   Wt Readings from Last 3 Encounters:  10/19/21 154 lb 8 oz (70.1 kg)  10/05/21 153 lb (69.4 kg)  09/07/21 152 lb (68.9 kg)    Physical Exam Vitals and nursing note reviewed.  Constitutional:      General: She is not in acute distress.    Appearance: Normal appearance. She is not ill-appearing, toxic-appearing or diaphoretic.  HENT:     Head: Normocephalic and atraumatic.     Right Ear: Tympanic membrane, ear canal and external ear normal. There is no impacted cerumen.     Left Ear: Tympanic membrane, ear canal and external ear normal. There is no impacted cerumen.     Nose:  Nose normal. No congestion or rhinorrhea.     Mouth/Throat:     Mouth: Mucous membranes are moist.     Pharynx: Oropharynx is clear. No oropharyngeal exudate or posterior oropharyngeal erythema.  Eyes:     General: No scleral icterus.       Right eye: No discharge.        Left eye: No discharge.     Extraocular Movements: Extraocular movements intact.      Conjunctiva/sclera: Conjunctivae normal.     Pupils: Pupils are equal, round, and reactive to light.  Neck:     Vascular: No carotid bruit.  Cardiovascular:     Rate and Rhythm: Normal rate and regular rhythm.     Pulses: Normal pulses.     Heart sounds: No murmur heard.   No friction rub. No gallop.  Pulmonary:     Effort: Pulmonary effort is normal. No respiratory distress.     Breath sounds: Normal breath sounds. No stridor. No wheezing, rhonchi or rales.  Chest:     Chest wall: No tenderness.  Abdominal:     General: Abdomen is flat. Bowel sounds are normal. There is no distension.     Palpations: Abdomen is soft. There is no mass.     Tenderness: There is no abdominal tenderness. There is no right CVA tenderness, left CVA tenderness, guarding or rebound.     Hernia: No hernia is present.  Genitourinary:    Comments: Breast and pelvic exams deferred with shared decision making Musculoskeletal:        General: No swelling, tenderness, deformity or signs of injury.     Cervical back: Normal range of motion and neck supple. No rigidity. No muscular tenderness.     Right lower leg: No edema.     Left lower leg: No edema.  Lymphadenopathy:     Cervical: No cervical adenopathy.  Skin:    General: Skin is warm and dry.     Capillary Refill: Capillary refill takes less than 2 seconds.     Coloration: Skin is not jaundiced or pale.     Findings: No bruising, erythema, lesion or rash.  Neurological:     General: No focal deficit present.     Mental Status: She is alert and oriented to person, place, and time. Mental status is at baseline.     Cranial Nerves: No cranial nerve deficit.     Sensory: No sensory deficit.     Motor: No weakness.     Coordination: Coordination normal.     Gait: Gait normal.     Deep Tendon Reflexes: Reflexes normal.  Psychiatric:        Mood and Affect: Mood normal.        Behavior: Behavior normal.        Thought Content: Thought content normal.         Judgment: Judgment normal.    Results for orders placed or performed in visit on 10/05/21  CBC With Differential/Platelet  Result Value Ref Range   WBC 9.0 3.4 - 10.8 x10E3/uL   RBC 4.78 3.77 - 5.28 x10E6/uL   Hemoglobin 14.4 11.1 - 15.9 g/dL   Hematocrit 41.0 34.0 - 46.6 %   MCV 86 79 - 97 fL   MCH 30.1 26.6 - 33.0 pg   MCHC 35.1 31.5 - 35.7 g/dL   RDW 14.5 11.7 - 15.4 %   Platelets 252 150 - 450 x10E3/uL   Neutrophils 69 Not Estab. %   Lymphs 25  Not Estab. %   MID 6 Not Estab. %   Neutrophils Absolute 6.2 1.4 - 7.0 x10E3/uL   Lymphocytes Absolute 2.3 0.7 - 3.1 x10E3/uL   MID (Absolute) 0.5 0.1 - 1.6 X10E3/uL  Urinalysis, Routine w reflex microscopic  Result Value Ref Range   Specific Gravity, UA 1.020 1.005 - 1.030   pH, UA 7.0 5.0 - 7.5   Color, UA Yellow Yellow   Appearance Ur Clear Clear   Leukocytes,UA Negative Negative   Protein,UA Trace (A) Negative/Trace   Glucose, UA Negative Negative   Ketones, UA Negative Negative   RBC, UA Negative Negative   Bilirubin, UA Negative Negative   Urobilinogen, Ur 0.2 0.2 - 1.0 mg/dL   Nitrite, UA Negative Negative  Pregnancy, urine  Result Value Ref Range   Preg Test, Ur Negative Negative      Assessment & Plan:   Problem List Items Addressed This Visit       Endocrine   Type 2 diabetes mellitus with hyperglycemia, without long-term current use of insulin (HCC)    Under good control on current regimen. Continue current regimen. Continue to monitor. Call with any concerns. Refills given. Labs drawn today.       Relevant Medications   metFORMIN (GLUCOPHAGE-XR) 500 MG 24 hr tablet   Semaglutide, 1 MG/DOSE, (OZEMPIC, 1 MG/DOSE,) 4 MG/3ML SOPN   Other Relevant Orders   Bayer DCA Hb A1c Waived   B12     Other   Hyperlipidemia    Under good control on current regimen. Continue current regimen. Continue to monitor. Call with any concerns. Refills given. Labs drawn today.       Relevant Orders   Lipid Panel w/o  Chol/HDL Ratio   Vitamin D deficiency disease    Rechecking labs today. Await results. Treat as needed.      Relevant Orders   VITAMIN D 25 Hydroxy (Vit-D Deficiency, Fractures)   Depression    Under good control on current regimen. Continue current regimen. Continue to monitor. Call with any concerns. Refills given. Labs drawn today.       Relevant Medications   busPIRone (BUSPAR) 10 MG tablet   Other Visit Diagnoses     Routine general medical examination at a health care facility    -  Primary   Vaccines up to date. Screening labs checked today. Pap up to date. Mammo due in January. Continue diet and exercise. Call with any concerns.    Relevant Orders   Bayer DCA Hb A1c Waived   CBC with Differential/Platelet   Comprehensive metabolic panel   Lipid Panel w/o Chol/HDL Ratio   Urinalysis, Routine w reflex microscopic   TSH   VITAMIN D 25 Hydroxy (Vit-D Deficiency, Fractures)   Leg cramps       Of unclear etiology. Will check labs and x-rays. Await results. Treat as needed. Call with any concerns. Continue to monitor.   Relevant Orders   DG Lumbar Spine Complete   DG Tibia/Fibula Left   DG Tibia/Fibula Right   Magnesium   Phosphorus   Encounter for screening mammogram for malignant neoplasm of breast       Mammogram due in January. Ordered today.   Relevant Orders   MM 3D SCREEN BREAST BILATERAL        Follow up plan: Return in about 6 months (around 04/18/2022).   LABORATORY TESTING:  - Pap smear: up to date  IMMUNIZATIONS:   - Tdap: Tetanus vaccination status reviewed: last tetanus booster within  10 years. - Influenza: Up to date - Pneumovax: Up to date - Prevnar: Not applicable - COVID: Up to date - HPV: Not applicable - Shingrix vaccine: Not applicable  SCREENING: -Mammogram: Up to date    PATIENT COUNSELING:   Advised to take 1 mg of folate supplement per day if capable of pregnancy.   Sexuality: Discussed sexually transmitted diseases, partner  selection, use of condoms, avoidance of unintended pregnancy  and contraceptive alternatives.   Advised to avoid cigarette smoking.  I discussed with the patient that most people either abstain from alcohol or drink within safe limits (<=14/week and <=4 drinks/occasion for males, <=7/weeks and <= 3 drinks/occasion for females) and that the risk for alcohol disorders and other health effects rises proportionally with the number of drinks per week and how often a drinker exceeds daily limits.  Discussed cessation/primary prevention of drug use and availability of treatment for abuse.   Diet: Encouraged to adjust caloric intake to maintain  or achieve ideal body weight, to reduce intake of dietary saturated fat and total fat, to limit sodium intake by avoiding high sodium foods and not adding table salt, and to maintain adequate dietary potassium and calcium preferably from fresh fruits, vegetables, and low-fat dairy products.    stressed the importance of regular exercise  Injury prevention: Discussed safety belts, safety helmets, smoke detector, smoking near bedding or upholstery.   Dental health: Discussed importance of regular tooth brushing, flossing, and dental visits.    NEXT PREVENTATIVE PHYSICAL DUE IN 1 YEAR. Return in about 6 months (around 04/18/2022).

## 2021-10-19 NOTE — Assessment & Plan Note (Signed)
Under good control on current regimen. Continue current regimen. Continue to monitor. Call with any concerns. Refills given. Labs drawn today.   

## 2021-10-19 NOTE — Assessment & Plan Note (Signed)
Rechecking labs today. Await results. Treat as needed.  °

## 2021-10-20 LAB — CBC WITH DIFFERENTIAL/PLATELET
Basophils Absolute: 0 10*3/uL (ref 0.0–0.2)
Basos: 1 %
EOS (ABSOLUTE): 0.1 10*3/uL (ref 0.0–0.4)
Eos: 1 %
Hematocrit: 41.6 % (ref 34.0–46.6)
Hemoglobin: 13.9 g/dL (ref 11.1–15.9)
Immature Grans (Abs): 0 10*3/uL (ref 0.0–0.1)
Immature Granulocytes: 0 %
Lymphocytes Absolute: 2.2 10*3/uL (ref 0.7–3.1)
Lymphs: 33 %
MCH: 30.3 pg (ref 26.6–33.0)
MCHC: 33.4 g/dL (ref 31.5–35.7)
MCV: 91 fL (ref 79–97)
Monocytes Absolute: 0.4 10*3/uL (ref 0.1–0.9)
Monocytes: 7 %
Neutrophils Absolute: 3.8 10*3/uL (ref 1.4–7.0)
Neutrophils: 58 %
Platelets: 208 10*3/uL (ref 150–450)
RBC: 4.58 x10E6/uL (ref 3.77–5.28)
RDW: 14 % (ref 11.7–15.4)
WBC: 6.5 10*3/uL (ref 3.4–10.8)

## 2021-10-20 LAB — PHOSPHORUS: Phosphorus: 3.8 mg/dL (ref 3.0–4.3)

## 2021-10-20 LAB — COMPREHENSIVE METABOLIC PANEL
ALT: 20 IU/L (ref 0–32)
AST: 19 IU/L (ref 0–40)
Albumin/Globulin Ratio: 1.9 (ref 1.2–2.2)
Albumin: 4.7 g/dL (ref 3.8–4.8)
Alkaline Phosphatase: 96 IU/L (ref 44–121)
BUN/Creatinine Ratio: 22 (ref 9–23)
BUN: 13 mg/dL (ref 6–24)
Bilirubin Total: 0.8 mg/dL (ref 0.0–1.2)
CO2: 25 mmol/L (ref 20–29)
Calcium: 9.5 mg/dL (ref 8.7–10.2)
Chloride: 100 mmol/L (ref 96–106)
Creatinine, Ser: 0.59 mg/dL (ref 0.57–1.00)
Globulin, Total: 2.5 g/dL (ref 1.5–4.5)
Glucose: 76 mg/dL (ref 70–99)
Potassium: 4.3 mmol/L (ref 3.5–5.2)
Sodium: 139 mmol/L (ref 134–144)
Total Protein: 7.2 g/dL (ref 6.0–8.5)
eGFR: 114 mL/min/{1.73_m2} (ref >59)

## 2021-10-20 LAB — LIPID PANEL W/O CHOL/HDL RATIO
Cholesterol, Total: 236 mg/dL — ABNORMAL HIGH (ref 100–199)
HDL: 55 mg/dL (ref >39)
LDL Chol Calc (NIH): 159 mg/dL — ABNORMAL HIGH (ref 0–99)
Triglycerides: 121 mg/dL (ref 0–149)
VLDL Cholesterol Cal: 22 mg/dL (ref 5–40)

## 2021-10-20 LAB — MAGNESIUM: Magnesium: 2.3 mg/dL (ref 1.6–2.3)

## 2021-10-20 LAB — VITAMIN D 25 HYDROXY (VIT D DEFICIENCY, FRACTURES): Vit D, 25-Hydroxy: 23.7 ng/mL — ABNORMAL LOW (ref 30.0–100.0)

## 2021-10-20 LAB — VITAMIN B12: Vitamin B-12: 353 pg/mL (ref 232–1245)

## 2021-10-20 LAB — TSH: TSH: 1.86 u[IU]/mL (ref 0.450–4.500)

## 2021-10-25 ENCOUNTER — Telehealth: Payer: Self-pay

## 2021-10-25 NOTE — Telephone Encounter (Signed)
PA for Saxenda initiated and submitted via Cover My Meds. Key: [id number]

## 2021-10-26 ENCOUNTER — Ambulatory Visit
Admission: RE | Admit: 2021-10-26 | Discharge: 2021-10-26 | Disposition: A | Discharge status: Home / Self Care | Payer: No Typology Code available for payment source | Source: Ambulatory Visit | Attending: Student | Admitting: Student

## 2021-10-26 ENCOUNTER — Other Ambulatory Visit: Payer: Self-pay

## 2021-10-26 DIAGNOSIS — G43719 Chronic migraine without aura, intractable, without status migrainosus: Secondary | ICD-10-CM | POA: Diagnosis present

## 2021-10-31 ENCOUNTER — Other Ambulatory Visit: Payer: Self-pay

## 2021-10-31 MED ORDER — CANDESARTAN CILEXETIL 4 MG PO TABS
ORAL_TABLET | ORAL | 11 refills | Status: DC
  Filled 2021-10-31: qty 30, 30d supply, fill #0
  Filled 2021-12-07: qty 30, 30d supply, fill #1
  Filled 2022-01-25: qty 30, 30d supply, fill #2

## 2021-11-01 ENCOUNTER — Other Ambulatory Visit: Payer: Self-pay

## 2021-11-09 ENCOUNTER — Other Ambulatory Visit: Payer: Self-pay

## 2021-11-25 ENCOUNTER — Other Ambulatory Visit (HOSPITAL_COMMUNITY): Payer: Self-pay

## 2021-12-06 NOTE — Progress Notes (Signed)
BP 117/77    Pulse 85    Temp 98.2 F (36.8 C) (Oral)    Wt 153 lb (69.4 kg)    SpO2 99%    BMI 29.88 kg/m    Subjective:    Patient ID: Ashley Wiley, female    DOB: May 01, 1976, 46 y.o.   MRN: [id number]  HPI: Ashley Wiley is a 46 y.o. female  Chief Complaint  Patient presents with   Leg Pain    Pt states she has had pain and a burning sensation in her R shin for the past few months. States she has been taking Tylenol and Naproxen daily for this. States she had x-rays done this morning.    FOOT PAIN Patient states she had talked to Dr Wynetta Emery about this in the past but forgot that xrays were ordered.  She went and got them done this morning.  She states she is taking tylenol and naproxen everyday because the pain is waking her up at night.  Duration: months Involved foot: right Mechanism of injury: unknown Location:  Onset: gradual  Severity: 3/10  Quality:  dull and burning Frequency: constant Radiation: no Aggravating factors:  Has a lot of pain at night   Alleviating factors:  tylenol and NSAIDs -gets mild relief with medications Status: worse Treatments attempted: rest and ibuprofen  Relief with NSAIDs?:  mild Weakness with weight bearing or walking: no Morning stiffness: no Swelling: no Redness: no Bruising: no Paresthesias / decreased sensation: no  Fevers:no  Relevant past medical, surgical, family and social history reviewed and updated as indicated. Interim medical history since our last visit reviewed. Allergies and medications reviewed and updated.  Review of Systems  Musculoskeletal:        Right leg pain   Per HPI unless specifically indicated above     Objective:    BP 117/77    Pulse 85    Temp 98.2 F (36.8 C) (Oral)    Wt 153 lb (69.4 kg)    SpO2 99%    BMI 29.88 kg/m   Wt Readings from Last 3 Encounters:  12/07/21 153 lb (69.4 kg)  10/19/21 154 lb 8 oz (70.1 kg)  10/05/21 153 lb (69.4 kg)    Physical Exam Vitals and nursing  note reviewed.  Constitutional:      General: She is not in acute distress.    Appearance: Normal appearance. She is normal weight. She is not ill-appearing, toxic-appearing or diaphoretic.  HENT:     Head: Normocephalic.     Right Ear: External ear normal.     Left Ear: External ear normal.     Nose: Nose normal.     Mouth/Throat:     Mouth: Mucous membranes are moist.     Pharynx: Oropharynx is clear.  Eyes:     General:        Right eye: No discharge.        Left eye: No discharge.     Extraocular Movements: Extraocular movements intact.     Conjunctiva/sclera: Conjunctivae normal.     Pupils: Pupils are equal, round, and reactive to light.  Cardiovascular:     Rate and Rhythm: Normal rate and regular rhythm.     Heart sounds: No murmur heard. Pulmonary:     Effort: Pulmonary effort is normal. No respiratory distress.     Breath sounds: Normal breath sounds. No wheezing or rales.  Musculoskeletal:     Cervical back: Normal range of motion and neck  supple.       Legs:  Skin:    General: Skin is warm and dry.     Capillary Refill: Capillary refill takes less than 2 seconds.  Neurological:     General: No focal deficit present.     Mental Status: She is alert and oriented to person, place, and time. Mental status is at baseline.  Psychiatric:        Mood and Affect: Mood normal.        Behavior: Behavior normal.        Thought Content: Thought content normal.        Judgment: Judgment normal.    Results for orders placed or performed in visit on 10/19/21  Microscopic Examination   Urine  Result Value Ref Range   WBC, UA None seen 0 - 5 /hpf   RBC 3-10 (A) 0 - 2 /hpf   Epithelial Cells (non renal) 0-10 0 - 10 /hpf   Mucus, UA Present (A) Not Estab.   Bacteria, UA Few (A) None seen/Few  Bayer DCA Hb A1c Waived  Result Value Ref Range   HB A1C (BAYER DCA - WAIVED) 5.4 4.8 - 5.6 %  CBC with Differential/Platelet  Result Value Ref Range   WBC 6.5 3.4 - 10.8 x10E3/uL    RBC 4.58 3.77 - 5.28 x10E6/uL   Hemoglobin 13.9 11.1 - 15.9 g/dL   Hematocrit 41.6 34.0 - 46.6 %   MCV 91 79 - 97 fL   MCH 30.3 26.6 - 33.0 pg   MCHC 33.4 31.5 - 35.7 g/dL   RDW 14.0 11.7 - 15.4 %   Platelets 208 150 - 450 x10E3/uL   Neutrophils 58 Not Estab. %   Lymphs 33 Not Estab. %   Monocytes 7 Not Estab. %   Eos 1 Not Estab. %   Basos 1 Not Estab. %   Neutrophils Absolute 3.8 1.4 - 7.0 x10E3/uL   Lymphocytes Absolute 2.2 0.7 - 3.1 x10E3/uL   Monocytes Absolute 0.4 0.1 - 0.9 x10E3/uL   EOS (ABSOLUTE) 0.1 0.0 - 0.4 x10E3/uL   Basophils Absolute 0.0 0.0 - 0.2 x10E3/uL   Immature Granulocytes 0 Not Estab. %   Immature Grans (Abs) 0.0 0.0 - 0.1 x10E3/uL  Comprehensive metabolic panel  Result Value Ref Range   Glucose 76 70 - 99 mg/dL   BUN 13 6 - 24 mg/dL   Creatinine, Ser 0.59 0.57 - 1.00 mg/dL   eGFR 114 >59 mL/min/1.73   BUN/Creatinine Ratio 22 9 - 23   Sodium 139 134 - 144 mmol/L   Potassium 4.3 3.5 - 5.2 mmol/L   Chloride 100 96 - 106 mmol/L   CO2 25 20 - 29 mmol/L   Calcium 9.5 8.7 - 10.2 mg/dL   Total Protein 7.2 6.0 - 8.5 g/dL   Albumin 4.7 3.8 - 4.8 g/dL   Globulin, Total 2.5 1.5 - 4.5 g/dL   Albumin/Globulin Ratio 1.9 1.2 - 2.2   Bilirubin Total 0.8 0.0 - 1.2 mg/dL   Alkaline Phosphatase 96 44 - 121 IU/L   AST 19 0 - 40 IU/L   ALT 20 0 - 32 IU/L  Lipid Panel w/o Chol/HDL Ratio  Result Value Ref Range   Cholesterol, Total 236 (H) 100 - 199 mg/dL   Triglycerides 121 0 - 149 mg/dL   HDL 55 >39 mg/dL   VLDL Cholesterol Cal 22 5 - 40 mg/dL   LDL Chol Calc (NIH) 159 (H) 0 - 99 mg/dL  Urinalysis, Routine  w reflex microscopic  Result Value Ref Range   Specific Gravity, UA 1.015 1.005 - 1.030   pH, UA 7.5 5.0 - 7.5   Color, UA Yellow Yellow   Appearance Ur Clear Clear   Leukocytes,UA Negative Negative   Protein,UA Trace (A) Negative/Trace   Glucose, UA Negative Negative   Ketones, UA Negative Negative   RBC, UA 3+ (A) Negative   Bilirubin, UA Negative  Negative   Urobilinogen, Ur 0.2 0.2 - 1.0 mg/dL   Nitrite, UA Negative Negative   Microscopic Examination See below:   TSH  Result Value Ref Range   TSH 1.860 0.450 - 4.500 uIU/mL  VITAMIN D 25 Hydroxy (Vit-D Deficiency, Fractures)  Result Value Ref Range   Vit D, 25-Hydroxy 23.7 (L) 30.0 - 100.0 ng/mL  Magnesium  Result Value Ref Range   Magnesium 2.3 1.6 - 2.3 mg/dL  Phosphorus  Result Value Ref Range   Phosphorus 3.8 3.0 - 4.3 mg/dL  B12  Result Value Ref Range   Vitamin B-12 353 232 - 1,245 pg/mL      Assessment & Plan:   Problem List Items Addressed This Visit   None Visit Diagnoses     Muscle strain    -  Primary   Will treat with streoids. Patient has xrays done this morning. Will wait for results to discuss further plan of care. FU if symptoms do not improve.        Follow up plan: Return if symptoms worsen or fail to improve.

## 2021-12-07 ENCOUNTER — Other Ambulatory Visit: Payer: Self-pay

## 2021-12-07 ENCOUNTER — Ambulatory Visit
Admission: RE | Admit: 2021-12-07 | Discharge: 2021-12-07 | Disposition: A | Discharge status: Home / Self Care | Payer: No Typology Code available for payment source | Source: Ambulatory Visit | Attending: Family Medicine | Admitting: Family Medicine

## 2021-12-07 ENCOUNTER — Ambulatory Visit
Admission: RE | Admit: 2021-12-07 | Discharge: 2021-12-07 | Disposition: A | Discharge status: Home / Self Care | Payer: No Typology Code available for payment source | Attending: Family Medicine | Admitting: Family Medicine

## 2021-12-07 ENCOUNTER — Ambulatory Visit (INDEPENDENT_AMBULATORY_CARE_PROVIDER_SITE_OTHER): Payer: No Typology Code available for payment source | Admitting: Nurse Practitioner

## 2021-12-07 ENCOUNTER — Encounter: Payer: Self-pay | Admitting: Nurse Practitioner

## 2021-12-07 VITALS — BP 117/77 | HR 85 | Temp 98.2°F | Wt 153.0 lb

## 2021-12-07 DIAGNOSIS — T148XXA Other injury of unspecified body region, initial encounter: Secondary | ICD-10-CM

## 2021-12-07 DIAGNOSIS — R252 Cramp and spasm: Secondary | ICD-10-CM | POA: Insufficient documentation

## 2021-12-07 MED ORDER — PREDNISONE 10 MG PO TABS
10.0000 mg | ORAL_TABLET | Freq: Every day | ORAL | 0 refills | Status: DC
  Filled 2021-12-07: qty 42, 6d supply, fill #0

## 2021-12-07 NOTE — Progress Notes (Signed)
Please let patient know that her xray's shows that she is hells spurs on both feet.  The steroids can help with the inflammation she is experiencing right now but she should see Ortho for further evaluation and management.

## 2021-12-30 ENCOUNTER — Encounter: Payer: Self-pay | Admitting: Family Medicine

## 2021-12-30 ENCOUNTER — Ambulatory Visit (INDEPENDENT_AMBULATORY_CARE_PROVIDER_SITE_OTHER): Payer: No Typology Code available for payment source | Admitting: Family Medicine

## 2021-12-30 ENCOUNTER — Other Ambulatory Visit: Payer: Self-pay

## 2021-12-30 VITALS — BP 110/76 | HR 75 | Temp 98.3°F | Wt 151.0 lb

## 2021-12-30 DIAGNOSIS — S0501XA Injury of conjunctiva and corneal abrasion without foreign body, right eye, initial encounter: Secondary | ICD-10-CM

## 2021-12-30 MED ORDER — ERYTHROMYCIN 5 MG/GM OP OINT
1.0000 "application " | TOPICAL_OINTMENT | Freq: Three times a day (TID) | OPHTHALMIC | 0 refills | Status: DC
  Filled 2021-12-30: qty 3.5, 7d supply, fill #0

## 2021-12-30 NOTE — Progress Notes (Signed)
BP 110/76    Pulse 75    Temp 98.3 F (36.8 C)    Wt 151 lb (68.5 kg)    SpO2 98%    BMI 29.49 kg/m    Subjective:    Patient ID: Ashley Wiley, female    DOB: 1976-05-14, 46 y.o.   MRN: [id number]  HPI: Ashley Wiley is a 46 y.o. female  Chief Complaint  Patient presents with   Eye Pain    Patient is having right eye pain and redness    EYE PAIN Duration:  1 week Involved eye:  right Onset: sudden Severity: moderate  Quality: aching and sore Foreign body sensation:yes Visual impairment: no Eye redness: yes Discharge: no Crusting or matting of eyelids: no Swelling: no Photophobia: no Itching: no Tearing: yes Headache: no Floaters: no URI symptoms: no Contact lens use: yes Close contacts with similar problems: no Eye trauma: had contact in overnight about a week ago Status: stable Treatments attempted: no contacts   Relevant past medical, surgical, family and social history reviewed and updated as indicated. Interim medical history since our last visit reviewed. Allergies and medications reviewed and updated.  Review of Systems  Constitutional: Negative.   HENT: Negative.    Eyes:  Positive for pain and redness. Negative for photophobia, discharge, itching and visual disturbance.  Respiratory: Negative.    Cardiovascular: Negative.   Gastrointestinal: Negative.   Musculoskeletal: Negative.   Psychiatric/Behavioral: Negative.     Per HPI unless specifically indicated above     Objective:    BP 110/76    Pulse 75    Temp 98.3 F (36.8 C)    Wt 151 lb (68.5 kg)    SpO2 98%    BMI 29.49 kg/m   Wt Readings from Last 3 Encounters:  12/30/21 151 lb (68.5 kg)  12/07/21 153 lb (69.4 kg)  10/19/21 154 lb 8 oz (70.1 kg)    Physical Exam Vitals and nursing note reviewed.  Constitutional:      General: She is not in acute distress.    Appearance: Normal appearance. She is not ill-appearing, toxic-appearing or diaphoretic.  HENT:     Head:  Normocephalic and atraumatic.     Right Ear: External ear normal.     Left Ear: External ear normal.     Nose: Nose normal.     Mouth/Throat:     Mouth: Mucous membranes are moist.     Pharynx: Oropharynx is clear.  Eyes:     General: No scleral icterus.       Right eye: No discharge.        Left eye: No discharge.     Extraocular Movements: Extraocular movements intact.     Conjunctiva/sclera:     Right eye: Right conjunctiva is injected.     Pupils: Pupils are equal, round, and reactive to light.   Cardiovascular:     Rate and Rhythm: Normal rate and regular rhythm.     Pulses: Normal pulses.     Heart sounds: Normal heart sounds. No murmur heard.   No friction rub. No gallop.  Pulmonary:     Effort: Pulmonary effort is normal. No respiratory distress.     Breath sounds: Normal breath sounds. No stridor. No wheezing, rhonchi or rales.  Chest:     Chest wall: No tenderness.  Musculoskeletal:        General: Normal range of motion.     Cervical back: Normal range of motion and neck supple.  Skin:    General: Skin is warm and dry.     Capillary Refill: Capillary refill takes less than 2 seconds.     Coloration: Skin is not jaundiced or pale.     Findings: No bruising, erythema, lesion or rash.  Neurological:     General: No focal deficit present.     Mental Status: She is alert and oriented to person, place, and time. Mental status is at baseline.  Psychiatric:        Mood and Affect: Mood normal.        Behavior: Behavior normal.        Thought Content: Thought content normal.        Judgment: Judgment normal.    Results for orders placed or performed in visit on 10/19/21  Microscopic Examination   Urine  Result Value Ref Range   WBC, UA None seen 0 - 5 /hpf   RBC 3-10 (A) 0 - 2 /hpf   Epithelial Cells (non renal) 0-10 0 - 10 /hpf   Mucus, UA Present (A) Not Estab.   Bacteria, UA Few (A) None seen/Few  Bayer DCA Hb A1c Waived  Result Value Ref Range   HB A1C  (BAYER DCA - WAIVED) 5.4 4.8 - 5.6 %  CBC with Differential/Platelet  Result Value Ref Range   WBC 6.5 3.4 - 10.8 x10E3/uL   RBC 4.58 3.77 - 5.28 x10E6/uL   Hemoglobin 13.9 11.1 - 15.9 g/dL   Hematocrit 41.6 34.0 - 46.6 %   MCV 91 79 - 97 fL   MCH 30.3 26.6 - 33.0 pg   MCHC 33.4 31.5 - 35.7 g/dL   RDW 14.0 11.7 - 15.4 %   Platelets 208 150 - 450 x10E3/uL   Neutrophils 58 Not Estab. %   Lymphs 33 Not Estab. %   Monocytes 7 Not Estab. %   Eos 1 Not Estab. %   Basos 1 Not Estab. %   Neutrophils Absolute 3.8 1.4 - 7.0 x10E3/uL   Lymphocytes Absolute 2.2 0.7 - 3.1 x10E3/uL   Monocytes Absolute 0.4 0.1 - 0.9 x10E3/uL   EOS (ABSOLUTE) 0.1 0.0 - 0.4 x10E3/uL   Basophils Absolute 0.0 0.0 - 0.2 x10E3/uL   Immature Granulocytes 0 Not Estab. %   Immature Grans (Abs) 0.0 0.0 - 0.1 x10E3/uL  Comprehensive metabolic panel  Result Value Ref Range   Glucose 76 70 - 99 mg/dL   BUN 13 6 - 24 mg/dL   Creatinine, Ser 0.59 0.57 - 1.00 mg/dL   eGFR 114 >59 mL/min/1.73   BUN/Creatinine Ratio 22 9 - 23   Sodium 139 134 - 144 mmol/L   Potassium 4.3 3.5 - 5.2 mmol/L   Chloride 100 96 - 106 mmol/L   CO2 25 20 - 29 mmol/L   Calcium 9.5 8.7 - 10.2 mg/dL   Total Protein 7.2 6.0 - 8.5 g/dL   Albumin 4.7 3.8 - 4.8 g/dL   Globulin, Total 2.5 1.5 - 4.5 g/dL   Albumin/Globulin Ratio 1.9 1.2 - 2.2   Bilirubin Total 0.8 0.0 - 1.2 mg/dL   Alkaline Phosphatase 96 44 - 121 IU/L   AST 19 0 - 40 IU/L   ALT 20 0 - 32 IU/L  Lipid Panel w/o Chol/HDL Ratio  Result Value Ref Range   Cholesterol, Total 236 (H) 100 - 199 mg/dL   Triglycerides 121 0 - 149 mg/dL   HDL 55 >39 mg/dL   VLDL Cholesterol Cal 22 5 - 40 mg/dL  LDL Chol Calc (NIH) 159 (H) 0 - 99 mg/dL  Urinalysis, Routine w reflex microscopic  Result Value Ref Range   Specific Gravity, UA 1.015 1.005 - 1.030   pH, UA 7.5 5.0 - 7.5   Color, UA Yellow Yellow   Appearance Ur Clear Clear   Leukocytes,UA Negative Negative   Protein,UA Trace (A)  Negative/Trace   Glucose, UA Negative Negative   Ketones, UA Negative Negative   RBC, UA 3+ (A) Negative   Bilirubin, UA Negative Negative   Urobilinogen, Ur 0.2 0.2 - 1.0 mg/dL   Nitrite, UA Negative Negative   Microscopic Examination See below:   TSH  Result Value Ref Range   TSH 1.860 0.450 - 4.500 uIU/mL  VITAMIN D 25 Hydroxy (Vit-D Deficiency, Fractures)  Result Value Ref Range   Vit D, 25-Hydroxy 23.7 (L) 30.0 - 100.0 ng/mL  Magnesium  Result Value Ref Range   Magnesium 2.3 1.6 - 2.3 mg/dL  Phosphorus  Result Value Ref Range   Phosphorus 3.8 3.0 - 4.3 mg/dL  B12  Result Value Ref Range   Vitamin B-12 353 232 - 1,245 pg/mL      Assessment & Plan:   Problem List Items Addressed This Visit   None Visit Diagnoses     Abrasion of right cornea, initial encounter    -  Primary   Avoid contacts. Will treat with erythromycin ointment. Call if not better Monday and we'll get her into opthalmology.        Follow up plan: Return if symptoms worsen or fail to improve.

## 2022-01-02 ENCOUNTER — Encounter: Payer: Self-pay | Admitting: Family Medicine

## 2022-01-02 DIAGNOSIS — H5711 Ocular pain, right eye: Secondary | ICD-10-CM

## 2022-01-03 ENCOUNTER — Other Ambulatory Visit: Payer: Self-pay

## 2022-01-03 MED ORDER — PREDNISOLONE ACETATE 1 % OP SUSP
OPHTHALMIC | 1 refills | Status: DC
  Filled 2022-01-03: qty 5, 18d supply, fill #0

## 2022-01-04 ENCOUNTER — Other Ambulatory Visit: Payer: Self-pay

## 2022-01-16 ENCOUNTER — Encounter: Payer: Self-pay | Admitting: Family Medicine

## 2022-01-16 ENCOUNTER — Other Ambulatory Visit: Payer: Self-pay | Admitting: Family Medicine

## 2022-01-16 DIAGNOSIS — M7730 Calcaneal spur, unspecified foot: Secondary | ICD-10-CM

## 2022-01-18 ENCOUNTER — Other Ambulatory Visit: Payer: Self-pay | Admitting: Podiatry

## 2022-01-18 ENCOUNTER — Other Ambulatory Visit: Payer: Self-pay

## 2022-01-18 ENCOUNTER — Encounter: Payer: Self-pay | Admitting: Podiatry

## 2022-01-18 ENCOUNTER — Ambulatory Visit: Payer: No Typology Code available for payment source | Admitting: Podiatry

## 2022-01-18 ENCOUNTER — Ambulatory Visit (INDEPENDENT_AMBULATORY_CARE_PROVIDER_SITE_OTHER): Payer: No Typology Code available for payment source

## 2022-01-18 DIAGNOSIS — M2550 Pain in unspecified joint: Secondary | ICD-10-CM

## 2022-01-18 DIAGNOSIS — M7750 Other enthesopathy of unspecified foot: Secondary | ICD-10-CM

## 2022-01-20 ENCOUNTER — Ambulatory Visit: Payer: No Typology Code available for payment source | Admitting: Family Medicine

## 2022-01-23 ENCOUNTER — Encounter: Payer: Self-pay | Admitting: Podiatry

## 2022-01-23 NOTE — Progress Notes (Signed)
?  Subjective:  ?Patient ID: Ashley Wiley, female    DOB: 01/26/1976,  MRN: [id number] ? ?Chief Complaint  ?Patient presents with  ? Foot Pain  ?  Patient presents for bilat foot and ankle pain that radiates into legs x 6 months  ? ? ?46 y.o. female presents with the above complaint. History confirmed with patient.  The wakes her at night.  Its in the top of the feet into the ankles and then radiates up her legs feel physical burning throbbing pain.  Is worse by the end of the day.  Also has hand and knee pain occasionally ? ?Objective:  ?Physical Exam: ?warm, good capillary refill, no trophic changes or ulcerative lesions, normal DP and PT pulses, normal sensory exam, and good smooth pain-free range of motion of ankle subtalar joint and midtarsal joint ? ? ?Radiographs: ?Multiple views x-ray of both feet: no fracture, dislocation, swelling or degenerative changes noted ?Assessment:  ? ?1. Polyarthralgia   ? ? ? ?Plan:  ?Patient was evaluated and treated and all questions answered. ? ?On exam she had no localizable musculoskeletal findings such as a tendinitis or primary arthritic changes within the foot.  Her radiographs show good joint spaces throughout the midtarsal joint subtalar joint ankle and forefoot.  With the symptoms she has with polyarticular involvement.  I recommended evaluating with a rheumatological work-up and I have ordered a CBC ESR HLA-B27 uric acid and RA panel.  Referral to rheumatology was placed and sent to [facility] rheumatology. ? ?No follow-ups on file.  ? ?

## 2022-01-25 ENCOUNTER — Other Ambulatory Visit: Payer: Self-pay | Admitting: Family Medicine

## 2022-01-25 ENCOUNTER — Other Ambulatory Visit: Payer: Self-pay

## 2022-01-26 ENCOUNTER — Other Ambulatory Visit: Payer: Self-pay

## 2022-01-26 NOTE — Telephone Encounter (Signed)
Discontinued 08/05/21. Not on current med profile. ?Requested Prescriptions  ?Pending Prescriptions Disp Refills  ?? diclofenac Sodium (VOLTAREN) 1 % GEL 100 g 2  ?  Sig: Apply 2 g topically 4 (four) times daily.  ?  ? Analgesics:  Topicals Failed - 01/25/2022  5:15 PM  ?  ?  Failed - Manual Review: Labs are only required if the patient has taken medication for more than 8 weeks.  ?  ?  Passed - PLT in normal range and within 360 days  ?  Platelets  ?Date Value Ref Range Status  ?01/20/2022 187 150 - 450 x10E3/uL Final  ?   ?  ?  Passed - HGB in normal range and within 360 days  ?  Hemoglobin  ?Date Value Ref Range Status  ?01/20/2022 12.5 11.1 - 15.9 g/dL Final  ?   ?  ?  Passed - HCT in normal range and within 360 days  ?  Hematocrit  ?Date Value Ref Range Status  ?01/20/2022 38.0 34.0 - 46.6 % Final  ?   ?  ?  Passed - Cr in normal range and within 360 days  ?  Creatinine, Ser  ?Date Value Ref Range Status  ?10/19/2021 0.59 0.57 - 1.00 mg/dL Final  ?   ?  ?  Passed - eGFR is 30 or above and within 360 days  ?  GFR calc Af Amer  ?Date Value Ref Range Status  ?01/06/2021 131 >59 mL/min/1.73 Final  ?  Comment:  ?  **In accordance with recommendations from the NKF-ASN Task force,** ?  [facility] is in the process of updating its eGFR calculation to the ?  2021 CKD-EPI creatinine equation that estimates kidney function ?  without a race variable. ?  ? ?GFR, Estimated  ?Date Value Ref Range Status  ?05/17/2021 >60 >60 mL/min Final  ?  Comment:  ?  (NOTE) ?Calculated using the CKD-EPI Creatinine Equation (2021) ?  ? ?eGFR  ?Date Value Ref Range Status  ?10/19/2021 114 >59 mL/min/1.73 Final  ?   ?  ?  Passed - Patient is not pregnant  ?  ?  Passed - Valid encounter within last 12 months  ?  Recent Outpatient Visits   ?      ? 3 weeks ago Abrasion of right cornea, initial encounter  ? [place], DO  ? 1 month ago Muscle strain  ? [place], NP  ? 3 months ago  Routine general medical examination at a health care facility  ? [place], DO  ? 3 months ago Lower abdominal pain  ? [place], DO  ? 5 months ago Visit for wound check  ? [facility], Scheryl Darter, NP  ?  ?  ?Future Appointments   ?        ? In 3 weeks Brendolyn Patty, MD [place]  ? In 2 months Wynetta Emery, Barb Merino, DO [facility]  ?  ? ?  ?  ?  ? ? ?

## 2022-01-31 LAB — CBC WITH DIFFERENTIAL/PLATELET
Basophils Absolute: 0 10*3/uL (ref 0.0–0.2)
Basos: 1 %
EOS (ABSOLUTE): 0.1 10*3/uL (ref 0.0–0.4)
Eos: 2 %
Hematocrit: 38 % (ref 34.0–46.6)
Hemoglobin: 12.5 g/dL (ref 11.1–15.9)
Immature Grans (Abs): 0 10*3/uL (ref 0.0–0.1)
Immature Granulocytes: 0 %
Lymphocytes Absolute: 2.7 10*3/uL (ref 0.7–3.1)
Lymphs: 44 %
MCH: 29.9 pg (ref 26.6–33.0)
MCHC: 32.9 g/dL (ref 31.5–35.7)
MCV: 91 fL (ref 79–97)
Monocytes Absolute: 0.5 10*3/uL (ref 0.1–0.9)
Monocytes: 7 %
Neutrophils Absolute: 2.9 10*3/uL (ref 1.4–7.0)
Neutrophils: 46 %
Platelets: 187 10*3/uL (ref 150–450)
RBC: 4.18 x10E6/uL (ref 3.77–5.28)
RDW: 12 % (ref 11.7–15.4)
WBC: 6.2 10*3/uL (ref 3.4–10.8)

## 2022-01-31 LAB — ANA,IFA RA DIAG PNL W/RFLX TIT/PATN
ANA Titer 1: NEGATIVE
Cyclic Citrullin Peptide Ab: <1 units (ref 0–19)
Rheumatoid fact SerPl-aCnc: <10 IU/mL (ref <14.0)

## 2022-01-31 LAB — ANTI-CCP AB, IGG + IGA (RDL): Anti-CCP Ab, IgG + IgA (RDL): <20 Units (ref <20)

## 2022-01-31 LAB — URIC ACID: Uric Acid: 5.1 mg/dL (ref 2.6–6.2)

## 2022-01-31 LAB — HLA-B27 ANTIGEN: HLA B27: NEGATIVE

## 2022-02-17 ENCOUNTER — Other Ambulatory Visit: Payer: Self-pay

## 2022-02-17 MED ORDER — CANDESARTAN CILEXETIL 4 MG PO TABS
ORAL_TABLET | ORAL | 3 refills | Status: DC
  Filled 2022-02-17: qty 90, 90d supply, fill #0

## 2022-02-17 MED ORDER — NURTEC 75 MG PO TBDP
ORAL_TABLET | ORAL | 6 refills | Status: DC
  Filled 2022-02-17: qty 16, 16d supply, fill #0

## 2022-02-20 ENCOUNTER — Other Ambulatory Visit: Payer: Self-pay

## 2022-02-20 ENCOUNTER — Ambulatory Visit: Payer: No Typology Code available for payment source | Admitting: Dermatology

## 2022-02-20 DIAGNOSIS — L811 Chloasma: Secondary | ICD-10-CM | POA: Diagnosis not present

## 2022-02-20 DIAGNOSIS — B351 Tinea unguium: Secondary | ICD-10-CM

## 2022-02-20 NOTE — Progress Notes (Signed)
? ?  Follow-Up Visit ?  ?Subjective  ?Ashley Wiley is a 46 y.o. female who presents for the following: Follow-up (Patient here today for 4 month melasma and tinea unguium follow up. Patient currently using Elta MD, Finacea foam and Vitamin C daily for melasma, patient advises she has not noticed any change. Patient did 4 month course of terbinafine and is currently using Kerydin daily, patient advises left great toenail has improved. ). ? ?Patient with no other concerns today. ? ?Patient advises she did have some redness when using the hydroquinone containing tretinoin. Used formulation without tretinoin, didn't help. Also has tried oral and topical TXA. ? ?The following portions of the chart were reviewed this encounter and updated as appropriate:  ?  ?  ? ?Review of Systems:  No other skin or systemic complaints except as noted in HPI or Assessment and Plan. ? ?Objective  ?Well appearing patient in no apparent distress; mood and affect are within normal limits. ? ?A focused examination was performed including face, left foot and toenails. Relevant physical exam findings are noted in the Assessment and Plan. ? ?face ?Reticulated hyperpigmented patches bilateral zygoma ?No change when compared to photos ? ?left great toenail ?Improved, clear  ?Compared to baseline photos, much improved ? ? ? ? ? ? ?Assessment & Plan  ?Melasma ?face ? ?Chronic and persistent condition with duration or expected duration over one year. Condition is symptomatic/ bothersome to patient. Not currently at goal. Resistent to treatment. ? ?Discussed adding The Perfect Peel, need several for best results, non-covered procedure ? ?May try Cyspera/cysteamine cream once daily for about 4 months.  ? ?Continue Finacea foam one to two times daily ?Continue Vitamin C once daily ?Continue daily Elta tinted sunscreen ? ?Recommend daily broad spectrum sunscreen SPF 30+ to sun-exposed areas, reapply every 2 hours as needed.  Staying in the shade or  wearing long sleeves, sun glasses (UVA+UVB protection) and wide brim hats (4-inch brim around the entire circumference of the hat) are also recommended for sun protection.  ? ?Recommend taking Heliocare sun protection supplement daily in sunny weather for additional sun protection. For maximum protection on the sunniest days, you can take up to 2 capsules of regular Heliocare OR take 1 capsule of Heliocare Ultra. For prolonged exposure (such as a full day in the sun), you can repeat your dose of the supplement 4 hours after your first dose. Heliocare can be purchased at [employer], at some [facility] or at [email or web address].  ? ? ?Tinea unguium ?left great toenail ? ?Much improved post PO terbinafine course ? ?May continue Kerydin nightly  ? ?Related Medications ?Tavaborole (KERYDIN) 5 % SOLN ?Apply to affected toenail nightly ? ? ?Return in about 6 months (around 08/22/2022) for melasma, tinea unguium . ? ?Graciella Belton, RMA, am acting as scribe for Brendolyn Patty, MD . ? ?Documentation: I have reviewed the above documentation for accuracy and completeness, and I agree with the above. ? ?Brendolyn Patty MD  ? ?

## 2022-02-20 NOTE — Patient Instructions (Addendum)
[facility] ?[address] ? ?[phone] ? ?May try Cyspera or cysteamine cream once daily for about 4 months.  ?[email or web address] ? ?Continue Finacea foam one to two times daily ?Continue Vitamin C once daily ?Continue daily tinted sunscreen ? ?Recommend taking Heliocare sun protection supplement daily in sunny weather for additional sun protection. For maximum protection on the sunniest days, you can take up to 2 capsules of regular Heliocare OR take 1 capsule of Heliocare Ultra. For prolonged exposure (such as a full day in the sun), you can repeat your dose of the supplement 4 hours after your first dose. Heliocare can be purchased at [employer], at some [facility] or at [email or web address].   ? ?Recommend daily broad spectrum sunscreen SPF 30+ to sun-exposed areas, reapply every 2 hours as needed. Call for new or changing lesions.  ?Staying in the shade or wearing long sleeves, sun glasses (UVA+UVB protection) and wide brim hats (4-inch brim around the entire circumference of the hat) are also recommended for sun protection.  ? ?If You Need Anything After Your Visit ? ?If you have any questions or concerns for your doctor, please call our main line at [phone] and press option 4 to reach your doctor's medical assistant. If no one answers, please leave a voicemail as directed and we will return your call as soon as possible. Messages left after 4 pm will be answered the following business day.  ? ?You may also send [place] a message via MyChart. We typically respond to MyChart messages within 1-2 business days. ? ?For prescription refills, please ask your pharmacy to contact our office. Our fax number is [phone]. ? ?If you have an urgent issue when the clinic is closed that cannot wait until the next business day, you can page your doctor at the number below.   ? ?Please note that while we do our best to be available for urgent issues outside of office  hours, we are not available 24/7.  ? ?If you have an urgent issue and are unable to reach [place], you may choose to seek medical care at your doctor's office, retail clinic, urgent care center, or emergency room. ? ?If you have a medical emergency, please immediately call 911 or go to the emergency department. ? ?Pager Numbers ? ?- Dr. Nehemiah Massed: [phone] ? ?- Dr. Laurence Ferrari: [phone] ? ?- Dr. Nicole Kindred: [phone] ? ?In the event of inclement weather, please call our main line at [phone] for an update on the status of any delays or closures. ? ?Dermatology Medication Tips: ?Please keep the boxes that topical medications come in in order to help keep track of the instructions about where and how to use these. Pharmacies typically print the medication instructions only on the boxes and not directly on the medication tubes.  ? ?If your medication is too expensive, please contact our office at [phone] option 4 or send [place] a message through [facility].  ? ?We are unable to tell what your co-pay for medications will be in advance as this is different depending on your insurance coverage. However, we may be able to find a substitute medication at lower cost or fill out paperwork to get insurance to cover a needed medication.  ? ?If a prior authorization is required to get your medication covered by your insurance company, please allow [place] 1-2 business days to complete this process. ? ?Drug prices often vary depending on where the prescription is filled and some pharmacies may offer  cheaper prices. ? ?The website www.goodrx.com contains coupons for medications through different pharmacies. The prices here do not account for what the cost may be with help from insurance (it may be cheaper with your insurance), but the website can give you the price if you did not use any insurance.  ?- You can print the associated coupon and take it with your prescription to the pharmacy.  ?- You may also stop by our office during regular  business hours and pick up a GoodRx coupon card.  ?- If you need your prescription sent electronically to a different pharmacy, notify our office through [facility] or by phone at [phone] option 4. ? ? ? ? ?Si Usted Necesita Algo Despu?s de Su Visita ? ?Tambi?n puede enviarnos un mensaje a trav?s de MyChart. Por lo general respondemos a los mensajes de MyChart en el transcurso de 1 a 2 d?as h?biles. ? ?Para renovar recetas, por favor pida a su farmacia que se ponga en contacto con nuestra oficina. Nuestro n?mero de fax es el [phone]. ? ?Si tiene un asunto urgente cuando la cl?nica est? cerrada y que no puede esperar hasta el siguiente d?a h?bil, puede llamar/localizar a su doctor(a) al n?mero que aparece a continuaci?n.  ? ?Por favor, tenga en cuenta que aunque hacemos todo lo posible para estar disponibles para asuntos urgentes fuera del horario de oficina, no estamos disponibles las 24 horas del d?a, los 7 d?as de la semana.  ? ?Si tiene un problema urgente y no puede comunicarse con nosotros, puede optar por buscar atenci?n m?dica  en el consultorio de su doctor(a), en una cl?nica privada, en un centro de atenci?n urgente o en una sala de emergencias. ? ?Si tiene Engineer, maintenance (IT) m?dica, por favor llame inmediatamente al 911 o vaya a la sala de emergencias. ? ?N?meros de b?per ? ?- Dr. Nehemiah Massed: [phone] ? ?- Dra. Moye: [phone] ? ?- Dra. Nicole Kindred: [phone] ? ?En caso de inclemencias del tiempo, por favor llame a nuestra l?nea principal al [phone] para una actualizaci?n sobre el estado de cualquier retraso o cierre. ? ?Consejos para la medicaci?n en dermatolog?a: ?Por favor, guarde las cajas en las que vienen los medicamentos de uso t?pico para ayudarle a seguir las instrucciones sobre d?nde y c?mo usarlos. Las farmacias generalmente imprimen las instrucciones del medicamento s?lo en las cajas y no directamente en los tubos del [place].  ? ?Si su medicamento es muy caro, por  favor, p?ngase en contacto con Zigmund Daniel llamando al [phone] y presione la opci?n 4 o env?enos un mensaje a trav?s de MyChart.  ? ?No podemos decirle cu?l ser? su copago por los medicamentos por adelantado ya que esto es diferente dependiendo de la cobertura de su seguro. Sin embargo, es posible que podamos encontrar un medicamento sustituto a Electrical engineer un formulario para que el seguro cubra el medicamento que se considera necesario.  ? ?Si se requiere Ardelia Mems autorizaci?n previa para que su compa??a de seguros [place] su medicamento, por favor perm?tanos de 1 a 2 d?as h?biles para completar este proceso. ? ?Los precios de los medicamentos var?an con frecuencia dependiendo del Environmental consultant de d?nde se surte la receta y alguna farmacias pueden ofrecer precios m?s baratos. ? ?El sitio web www.goodrx.com tiene cupones para medicamentos de Airline pilot. Los precios aqu? no tienen en cuenta lo que podr?a costar con la ayuda del seguro (puede ser m?s barato con su seguro), pero el sitio web puede darle el precio si no utiliz? ning?n  seguro.  ?- Puede imprimir el cup?n correspondiente y llevarlo con su receta a la farmacia.  ?- Tambi?n puede pasar por nuestra oficina durante el horario de atenci?n regular y recoger una tarjeta de cupones de GoodRx.  ?- Si necesita que su receta se env?e electr?nicamente a Chiropodist, informe a nuestra oficina a trav?s de MyChart de [place] o por tel?fono llamando al [phone] y presione la opci?n 4. ? ?

## 2022-03-13 ENCOUNTER — Other Ambulatory Visit: Payer: Self-pay

## 2022-03-29 ENCOUNTER — Other Ambulatory Visit: Payer: Self-pay

## 2022-03-29 MED ORDER — COVID-19 AT HOME ANTIGEN TEST VI KIT
PACK | 0 refills | Status: DC
  Filled 2022-03-29: qty 2, 4d supply, fill #0

## 2022-04-04 ENCOUNTER — Other Ambulatory Visit: Payer: Self-pay | Admitting: Student

## 2022-04-04 DIAGNOSIS — G43719 Chronic migraine without aura, intractable, without status migrainosus: Secondary | ICD-10-CM

## 2022-04-05 ENCOUNTER — Other Ambulatory Visit: Payer: Self-pay

## 2022-04-05 MED ORDER — GABAPENTIN 100 MG PO CAPS
ORAL_CAPSULE | ORAL | 0 refills | Status: DC
  Filled 2022-04-05: qty 249, 90d supply, fill #0

## 2022-04-06 ENCOUNTER — Other Ambulatory Visit: Payer: Self-pay | Admitting: Neurology

## 2022-04-06 ENCOUNTER — Other Ambulatory Visit (HOSPITAL_COMMUNITY): Payer: Self-pay | Admitting: Neurology

## 2022-04-06 DIAGNOSIS — Z8679 Personal history of other diseases of the circulatory system: Secondary | ICD-10-CM

## 2022-04-07 ENCOUNTER — Other Ambulatory Visit: Payer: Self-pay

## 2022-04-07 ENCOUNTER — Ambulatory Visit (INDEPENDENT_AMBULATORY_CARE_PROVIDER_SITE_OTHER): Payer: No Typology Code available for payment source | Admitting: Physician Assistant

## 2022-04-07 ENCOUNTER — Encounter: Payer: Self-pay | Admitting: Physician Assistant

## 2022-04-07 VITALS — BP 111/74 | HR 76 | Temp 98.0°F | Wt 154.0 lb

## 2022-04-07 DIAGNOSIS — N390 Urinary tract infection, site not specified: Secondary | ICD-10-CM | POA: Diagnosis not present

## 2022-04-07 DIAGNOSIS — R300 Dysuria: Secondary | ICD-10-CM | POA: Diagnosis not present

## 2022-04-07 DIAGNOSIS — R319 Hematuria, unspecified: Secondary | ICD-10-CM

## 2022-04-07 LAB — URINALYSIS, ROUTINE W REFLEX MICROSCOPIC
Bilirubin, UA: NEGATIVE
Glucose, UA: NEGATIVE
Ketones, UA: NEGATIVE
Nitrite, UA: NEGATIVE
Specific Gravity, UA: 1.02 (ref 1.005–1.030)
Urobilinogen, Ur: 0.2 mg/dL (ref 0.2–1.0)
pH, UA: 8 — ABNORMAL HIGH (ref 5.0–7.5)

## 2022-04-07 LAB — MICROSCOPIC EXAMINATION

## 2022-04-07 MED ORDER — SULFAMETHOXAZOLE-TRIMETHOPRIM 800-160 MG PO TABS
1.0000 | ORAL_TABLET | Freq: Two times a day (BID) | ORAL | 0 refills | Status: AC
  Filled 2022-04-07: qty 6, 3d supply, fill #0

## 2022-04-07 NOTE — Progress Notes (Signed)
Established Patient Office Visit  Name: Ashley Wiley   MRN: [id number]    DOB: 04/29/1976   Date:04/07/2022  Today's Provider: Talitha Givens, MHS, PA-C Introduced myself to the patient as a PA-C and provided education on APPs in clinical practice.         Subjective  Chief Complaint  Chief Complaint  Patient presents with   Dysuria    ?UTI     Dysuria  Associated symptoms include chills, flank pain and frequency. Pertinent negatives include no hematuria or urgency.   Reports lower abdomen / suprapubic pain over the past few days  Reports dysuria started yesterday morning Denies gross hematuria Reports mild flank pain    Patient Active Problem List   Diagnosis Date Noted   Visit for wound check 08/05/2021   Palpitations 07/27/2021   Tachycardia 07/27/2021   Chest pain 07/27/2021   Trigeminal neuralgia 12/13/2018   Type 2 diabetes mellitus with hyperglycemia, without long-term current use of insulin ([place]) 05/29/2018   Migraine without aura and without status migrainosus, not intractable 04/01/2018   Cerebral aneurysm, nonruptured 03/05/2018   Fibroid 02/08/2018   Menstrual migraine without status migrainosus, not intractable 02/08/2018   Menorrhagia with regular cycle 02/08/2018   Acute anxiety 01/13/2016   History of HPV infection 01/13/2016   Elevated ALT measurement 10/05/2015   Allergic rhinitis 07/13/2015   Thyroid nodule 07/13/2015   Obesity 05/14/2015   Hyperlipidemia    Patella-femoral syndrome    Vitamin D deficiency disease    Dermatofibroma    Depression    Pinguecula    Chronic pansinusitis 11/11/2014    Past Surgical History:  Procedure Laterality Date   CARPAL TUNNEL RELEASE     GANGLION CYST EXCISION Left    IR 3D INDEPENDENT WKST  12/04/2017   IR ANGIO INTRA EXTRACRAN SEL INTERNAL CAROTID BILAT MOD SED  12/04/2017   IR ANGIO INTRA EXTRACRAN SEL INTERNAL CAROTID BILAT MOD SED  05/17/2021   IR ANGIO INTRA EXTRACRAN SEL INTERNAL  CAROTID UNI L MOD SED  03/05/2018   IR ANGIO INTRA EXTRACRAN SEL INTERNAL CAROTID UNI L MOD SED  05/02/2019   IR ANGIO VERTEBRAL SEL VERTEBRAL BILAT MOD SED  12/04/2017   IR ANGIO VERTEBRAL SEL VERTEBRAL UNI R MOD SED  05/17/2021   IR ANGIOGRAM FOLLOW UP STUDY  03/05/2018   IR ANGIOGRAM FOLLOW UP STUDY  03/05/2018   IR ANGIOGRAM FOLLOW UP STUDY  03/05/2018   IR NEURO EACH ADD'L AFTER BASIC UNI LEFT (MS)  03/05/2018   IR TRANSCATH/EMBOLIZ  03/05/2018   IR US GUIDE VASC ACCESS RIGHT  05/17/2021   NASAL SINUS SURGERY  Nov 2015   RADIOLOGY WITH ANESTHESIA N/A 03/05/2018   Procedure: Arteriogram, coil embolization, possible stenting;  Surgeon: Consuella Lose, MD;  Location: [facility];  Service: Radiology;  Laterality: N/A;   TUBAL LIGATION      Family History  Problem Relation Age of Onset   Depression Mother    Anxiety disorder Mother    Heart attack Father 76   Heart failure Father    Diabetes Father    Hypertension Father    Heart disease Father    Kidney disease Father    Stroke Father    Aneurysm Father    Hyperlipidemia Father    Diabetes Paternal Grandfather    Sudden death Neg Hx     Social History   Tobacco Use   Smoking status: Never   Smokeless tobacco:  Never  Substance Use Topics   Alcohol use: Not Currently    Alcohol/week: 0.0 - 1.0 standard drinks    Comment: Drinks 3-4 times per year     Current Outpatient Medications:    busPIRone (BUSPAR) 10 MG tablet, Take 1-1.5 tablets (10-15 mg total) by mouth 3 (three) times daily as needed., Disp: 270 tablet, Rfl: 1   candesartan (ATACAND) 4 MG tablet, Take 1 tablet (4 mg total) by mouth once daily, Disp: 30 tablet, Rfl: 11   candesartan (ATACAND) 4 MG tablet, Take 1 tablet (4 mg total) by mouth once daily, Disp: 90 tablet, Rfl: 3   Cholecalciferol (VITAMIN D) 125 MCG (5000 UT) CAPS, , Disp: , Rfl:    COVID-19 At Home Antigen Test (CARESTART COVID-19 HOME TEST) KIT, Use as directed, Disp: 2 kit, Rfl: 0   erythromycin  ophthalmic ointment, Place 1 application into the right eye 3 (three) times daily., Disp: 3.5 g, Rfl: 0   fluticasone (FLONASE) 50 MCG/ACT nasal spray, PLACE 2 SPRAYS INTO BOTH NOSTRILS DAILY AS NEEDED FOR ALLERGIES., Disp: 16 g, Rfl: 12   gabapentin (NEURONTIN) 100 MG capsule, Take 1 capsule (100 mg total) by mouth at bedtime for 7 days, THEN 2 capsules (200 mg total) at bedtime for 7 days, THEN 3 capsules (300 mg total) at bedtime for 76 days., Disp: 249 capsule, Rfl: 0   IBUPROFEN PO, Take 800 mg by mouth 3 (three) times daily as needed. Advil, Disp: , Rfl:    loratadine (CLARITIN) 10 MG tablet, Take 10 mg by mouth daily as needed for allergies. , Disp: , Rfl:    Magnesium 500 MG TABS, , Disp: , Rfl:    metFORMIN (GLUCOPHAGE-XR) 500 MG 24 hr tablet, TAKE 1 TABLET BY MOUTH IN THE MORNING AND AT BEDTIME., Disp: 180 tablet, Rfl: 1   naproxen (NAPROSYN) 500 MG tablet, Take 1 tablet (500 mg total) by mouth 2 (two) times daily with a meal., Disp: 180 tablet, Rfl: 1   Rimegepant Sulfate (NURTEC) 75 MG TBDP, Take 1 tablet (75 mg total) by mouth as directed., Disp: 16 tablet, Rfl: 6   Semaglutide, 1 MG/DOSE, (OZEMPIC, 1 MG/DOSE,) 4 MG/3ML SOPN, Inject 1 mg into the skin once a week., Disp: 3 mL, Rfl: 6   sulfamethoxazole-trimethoprim (BACTRIM DS) 800-160 MG tablet, Take 1 tablet by mouth 2 (two) times daily for 3 days., Disp: 6 tablet, Rfl: 0   Tavaborole (KERYDIN) 5 % SOLN, Apply to affected toenail nightly, Disp: 10 mL, Rfl: 3   triamcinolone cream (KENALOG) 0.1 %, Apply to affected areas hands 1-2 times a day until improved., Disp: 45 g, Rfl: 1   Galcanezumab-gnlm (EMGALITY) 120 MG/ML SOAJ, Inject 240 mg subcutaneously once for 1 dose Initial loading dose (Patient not taking: Reported on 04/07/2022), Disp: 2 mL, Rfl: 0  Allergies  Allergen Reactions   Imitrex [Sumatriptan] Other (See Comments)    Chest pain    Wellbutrin [Bupropion] Other (See Comments)    Increased anxiety and tremors    I  personally reviewed active problem list, medication list, allergies with the patient/caregiver today.   Review of Systems  Constitutional:  Positive for chills. Negative for fever.  Gastrointestinal:  Positive for abdominal pain.  Genitourinary:  Positive for dysuria, flank pain and frequency. Negative for hematuria and urgency.  Neurological:  Positive for headaches. Negative for dizziness.     Objective  Vitals:   04/07/22 1044  BP: 111/74  Pulse: 76  Temp: 98 F (36.7 C)  TempSrc: Oral  SpO2: 100%  Weight: 154 lb (69.9 kg)    Body mass index is 30.08 kg/m.  Physical Exam Vitals reviewed.  Constitutional:      General: She is awake.     Appearance: Normal appearance. She is well-developed and well-groomed.  HENT:     Head: Normocephalic and atraumatic.  Abdominal:     General: Abdomen is flat. Bowel sounds are normal.     Palpations: Abdomen is soft.     Tenderness: There is abdominal tenderness in the suprapubic area.  Neurological:     Mental Status: She is alert.  Psychiatric:        Attention and Perception: Attention and perception normal.        Mood and Affect: Mood and affect normal.        Speech: Speech normal.        Behavior: Behavior normal. Behavior is cooperative.     Recent Results (from the past 2160 hour(s))  ANA,IFA RA Diag Pnl w/rflx Tit/Patn     Status: None   Collection Time: 01/20/22 11:39 AM  Result Value Ref Range   ANA Titer 1 Negative     Comment:                                      Negative   <1:80                                      Borderline  1:80                                      Positive   >1:80 ICAP nomenclature: AC-0 For more information about Hep-2 cell patterns use ANApatterns.org, the official website for the International Consensus on Antinuclear Antibody (ANA) Patterns (ICAP).    Rhuematoid fact SerPl-aCnc <10.0 <94.8 IU/mL   Cyclic Citrullin Peptide Ab <1 0 - 19 units    Comment:                            Negative               <20                           Weak positive      20 - 39                           Moderate positive  40 - 59                           Strong positive        >59   Uric Acid     Status: None   Collection Time: 01/20/22 11:39 AM  Result Value Ref Range   Uric Acid 5.1 2.6 - 6.2 mg/dL    Comment:            Therapeutic target for gout patients: <6.0  CBC with Differential     Status: None   Collection Time: 01/20/22 11:39 AM  Result Value Ref Range   WBC 6.2  3.4 - 10.8 x10E3/uL   RBC 4.18 3.77 - 5.28 x10E6/uL   Hemoglobin 12.5 11.1 - 15.9 g/dL   Hematocrit 38.0 34.0 - 46.6 %   MCV 91 79 - 97 fL   MCH 29.9 26.6 - 33.0 pg   MCHC 32.9 31.5 - 35.7 g/dL   RDW 12.0 11.7 - 15.4 %   Platelets 187 150 - 450 x10E3/uL   Neutrophils 46 Not Estab. %   Lymphs 44 Not Estab. %   Monocytes 7 Not Estab. %   Eos 2 Not Estab. %   Basos 1 Not Estab. %   Neutrophils Absolute 2.9 1.4 - 7.0 x10E3/uL   Lymphocytes Absolute 2.7 0.7 - 3.1 x10E3/uL   Monocytes Absolute 0.5 0.1 - 0.9 x10E3/uL   EOS (ABSOLUTE) 0.1 0.0 - 0.4 x10E3/uL   Basophils Absolute 0.0 0.0 - 0.2 x10E3/uL   Immature Granulocytes 0 Not Estab. %   Immature Grans (Abs) 0.0 0.0 - 0.1 x10E3/uL  HLA-B27 Antigen     Status: None   Collection Time: 01/20/22 11:39 AM  Result Value Ref Range   HLA B27 Negative     Comment: HLA-B*27 Negative B27 allele interpretation for all loci based on IMGT/HLA database version 3.44 This test was developed and its performance characteristics determined by [facility].  It has not been cleared or approved by the Food and Drug Administration. HLA Lab CLIA ID Number [id number] This test was performed using PCR (Polymerase Chain Reaction)/SSOP (Sequence Specific Oligonucleotide Probes) technique.  SBT (Sequence Based Typing) and/or SSP (Sequence Specific Primers) may be used as supplemental methods when necessary.  Please contact HLA Customer Service at [phone] if you have any  questions.  Director of HLA Laboratory  Dr Brooks Sailors, PhD   Anti-CCP Ab, IgG + IgA (RDL)     Status: None   Collection Time: 01/20/22 11:39 AM  Result Value Ref Range   Anti-CCP Ab, IgG + IgA (RDL) <20 <20 Units    Comment:                      Negative:                   <20                      Weak Positive:          20 - 39                      Moderate Positive:      40 - 59                      Strong Positive:            >59      PHQ2/9:    10/19/2021   10:33 AM 07/06/2021    9:33 AM 01/06/2021    8:47 AM 09/24/2020    3:49 PM 07/11/2020    1:23 PM  Depression screen PHQ 2/9  Decreased Interest 0 0 0 1 2  Down, Depressed, Hopeless 0 _0 PHQ - 2 Score 0 _1 Altered sleeping 2 1 0 1 3  Tired, decreased energy 0 0 0 3 3  Change in appetite 0 0 0 1 0  Feeling bad or failure about yourself  0 0 1 1 0  Trouble concentrating 0 _2 0  Moving slowly or fidgety/restless 0 0 0 1 1  Suicidal thoughts 0 0 0 0 0  PHQ-9 Score _0 Difficult doing work/chores Not difficult at all Somewhat difficult Not difficult at all Not difficult at all Somewhat difficult      Fall Risk:    07/06/2021    9:33 AM 09/24/2020    3:49 PM 07/06/2020    8:18 AM 03/20/2019   10:47 AM 02/27/2019    1:58 PM  [place] in the past year? 0 0 0 0 0  Number falls in past yr: 0 0 0    Injury with Fall? 0 0 0    Risk for fall due to : No Fall Risks No Fall Risks     Follow up Falls evaluation completed Falls evaluation completed   Falls evaluation completed      Functional Status Survey:      Assessment & Plan  Problem List Items Addressed This Visit   None Visit Diagnoses     Dysuria    -  Primary Acute, new problem UA was positive for WBC, RBC, mucus threads, moderate bacteria indicating likely UTI Will send for culture for ID and susceptibility testing to further guide treatment  Results to dictate further management     Relevant Orders   Urinalysis,  Routine w reflex microscopic   Urine Culture   Urinary tract infection with hematuria, site unspecified    Acute, new problem Patient reports dysuria, mild flank pain, and suprapubic pain  UA was positive for signs of UTI Will send in script for Bactrim 800-160 mg PO BID x 3 days Recommend staying well hydrated and finishing entire course of abx Culture results to dictate if change in abx is needed Follow up as needed for progressing or persistent symptoms     Relevant Medications   sulfamethoxazole-trimethoprim (BACTRIM DS) 800-160 MG tablet        No follow-ups on file.   I, Deyona Soza E Mirabella Hilario, PA-C, have reviewed all documentation for this visit. The documentation on 04/07/22 for the exam, diagnosis, procedures, and orders are all accurate and complete.   Talitha Givens, MHS, PA-C [facility]

## 2022-04-07 NOTE — Patient Instructions (Addendum)
Based on your symptoms and results of the urinalysis I believe you have a UTI I recommend the following:  I have sent in a script for Bactrim 800-160 mg to be taken by mouth twice per day for 3 days  Please finish the entire course of the antibiotic even if you are feeling better before it is completed. Stay well hydrated (at least 75 oz of water per day) and avoid holding your urine If you have any of the following please let us know: persistent symptoms, fever, trouble urinating or inability to urinate, confusion, flank pain.

## 2022-04-11 LAB — URINE CULTURE

## 2022-04-14 ENCOUNTER — Other Ambulatory Visit: Payer: Self-pay

## 2022-04-14 ENCOUNTER — Ambulatory Visit
Admission: RE | Admit: 2022-04-14 | Discharge: 2022-04-14 | Disposition: A | Discharge status: Home / Self Care | Payer: No Typology Code available for payment source | Source: Ambulatory Visit | Attending: Student | Admitting: Student

## 2022-04-14 DIAGNOSIS — G932 Benign intracranial hypertension: Secondary | ICD-10-CM | POA: Insufficient documentation

## 2022-04-14 DIAGNOSIS — G43719 Chronic migraine without aura, intractable, without status migrainosus: Secondary | ICD-10-CM | POA: Insufficient documentation

## 2022-04-14 LAB — CSF CELL COUNT WITH DIFFERENTIAL
Eosinophils, CSF: 0 %
Lymphs, CSF: 92 %
Monocyte-Macrophage-Spinal Fluid: 8 %
RBC Count, CSF: 0 /mm3 (ref 0–3)
Segmented Neutrophils-CSF: 0 %
Tube #: 3
WBC, CSF: 7 /mm3 — ABNORMAL HIGH (ref 0–5)

## 2022-04-14 LAB — GLUCOSE, CSF: Glucose, CSF: 55 mg/dL (ref 40–70)

## 2022-04-14 LAB — PROTEIN, CSF: Total  Protein, CSF: 20 mg/dL (ref 15–45)

## 2022-04-14 LAB — PREGNANCY, URINE: Preg Test, Ur: NEGATIVE

## 2022-04-14 MED ORDER — ACETAMINOPHEN 325 MG PO TABS
650.0000 mg | ORAL_TABLET | ORAL | Status: DC | PRN
  Filled 2022-04-14: qty 2

## 2022-04-14 NOTE — Procedures (Addendum)
Technically successful fluoro guided LP at L2-3 level with opening pressure of 30 cm H2O and closing pressure of 14 cm H2O.  13.5 cc of clear colorless CSF sent to lab for analysis.  No immediate post procedural complication.  Please see imaging section of Epic for full dictation.  Candiss Norse, PA-C

## 2022-04-18 ENCOUNTER — Ambulatory Visit: Payer: No Typology Code available for payment source | Admitting: Family Medicine

## 2022-04-18 LAB — IGG CSF INDEX
Albumin CSF-mCnc: 12 mg/dL (ref 8–37)
Albumin: 4.3 g/dL (ref 3.8–4.8)
CSF IgG Index: 0.5 (ref 0.0–0.7)
IgG (Immunoglobin G), Serum: 826 mg/dL (ref 586–1602)
IgG, CSF: 1.1 mg/dL (ref 0.0–6.7)
IgG/Alb Ratio, CSF: 0.09 (ref 0.00–0.25)

## 2022-04-19 ENCOUNTER — Ambulatory Visit (INDEPENDENT_AMBULATORY_CARE_PROVIDER_SITE_OTHER): Payer: No Typology Code available for payment source | Admitting: Family Medicine

## 2022-04-19 ENCOUNTER — Ambulatory Visit
Admission: RE | Admit: 2022-04-19 | Discharge: 2022-04-19 | Disposition: A | Discharge status: Home / Self Care | Payer: No Typology Code available for payment source | Source: Ambulatory Visit | Attending: Neurology | Admitting: Neurology

## 2022-04-19 ENCOUNTER — Other Ambulatory Visit: Payer: Self-pay

## 2022-04-19 ENCOUNTER — Encounter: Payer: Self-pay | Admitting: Family Medicine

## 2022-04-19 VITALS — BP 114/75 | HR 93 | Temp 97.9°F | Wt 156.4 lb

## 2022-04-19 DIAGNOSIS — G932 Benign intracranial hypertension: Secondary | ICD-10-CM | POA: Diagnosis not present

## 2022-04-19 DIAGNOSIS — F332 Major depressive disorder, recurrent severe without psychotic features: Secondary | ICD-10-CM

## 2022-04-19 DIAGNOSIS — Z8679 Personal history of other diseases of the circulatory system: Secondary | ICD-10-CM | POA: Diagnosis present

## 2022-04-19 DIAGNOSIS — E782 Mixed hyperlipidemia: Secondary | ICD-10-CM | POA: Diagnosis not present

## 2022-04-19 DIAGNOSIS — G43009 Migraine without aura, not intractable, without status migrainosus: Secondary | ICD-10-CM | POA: Diagnosis not present

## 2022-04-19 DIAGNOSIS — E1165 Type 2 diabetes mellitus with hyperglycemia: Secondary | ICD-10-CM

## 2022-04-19 DIAGNOSIS — Z9889 Other specified postprocedural states: Secondary | ICD-10-CM | POA: Insufficient documentation

## 2022-04-19 MED ORDER — NAPROXEN 500 MG PO TABS
500.0000 mg | ORAL_TABLET | Freq: Two times a day (BID) | ORAL | 1 refills | Status: DC
  Filled 2022-04-19: qty 5, 2d supply, fill #0
  Filled 2022-04-19: qty 180, 90d supply, fill #0
  Filled 2022-04-19: qty 175, 88d supply, fill #0
  Filled 2022-04-19: qty 180, 90d supply, fill #0

## 2022-04-19 MED ORDER — METFORMIN HCL ER 750 MG PO TB24
750.0000 mg | ORAL_TABLET | Freq: Every day | ORAL | 1 refills | Status: DC
  Filled 2022-04-19: qty 90, 90d supply, fill #0
  Filled 2022-09-01: qty 90, 90d supply, fill #1

## 2022-04-19 MED ORDER — SEMAGLUTIDE (2 MG/DOSE) 8 MG/3ML ~~LOC~~ SOPN
2.0000 mg | PEN_INJECTOR | SUBCUTANEOUS | 1 refills | Status: DC
  Filled 2022-04-19 – 2022-04-28 (×2): qty 9, 84d supply, fill #0
  Filled 2022-08-08: qty 3, 28d supply, fill #1
  Filled 2022-09-22: qty 3, 28d supply, fill #2
  Filled 2022-10-27: qty 3, 28d supply, fill #3

## 2022-04-19 MED ORDER — BUSPIRONE HCL 10 MG PO TABS
10.0000 mg | ORAL_TABLET | Freq: Three times a day (TID) | ORAL | 1 refills | Status: DC | PRN
Start: 2022-04-19 — End: 2022-11-01
  Filled 2022-04-19: qty 270, 60d supply, fill #0

## 2022-04-19 MED ORDER — GADOBUTROL 1 MMOL/ML IV SOLN
7.0000 mL | Freq: Once | INTRAVENOUS | Status: AC | PRN
  Administered 2022-04-19: 7 mL via INTRAVENOUS

## 2022-04-19 NOTE — Progress Notes (Signed)
BP 114/75   Pulse 93   Temp 97.9 F (36.6 C) (Oral)   Wt 156 lb 6.4 oz (70.9 kg)   SpO2 99%   BMI 31.59 kg/m    Subjective:    Patient ID: Ashley Wiley, female    DOB: Aug 09, 1976, 46 y.o.   MRN: [id number]  HPI: Ashley Wiley is a 46 y.o. female  Chief Complaint  Patient presents with   Diabetes   Depression   Hyperlipidemia   DEPRESSION Mood status: controlled Satisfied with current treatment?: yes Symptom severity: mild  Duration of current treatment : chronic Side effects: no Medication compliance: excellent compliance Psychotherapy/counseling: yes in the past Depressed mood: no Anxious mood: no Anhedonia: no Significant weight loss or gain: no Insomnia: no  Fatigue: yes Feelings of worthlessness or guilt: no Impaired concentration/indecisiveness: no Suicidal ideations: no Hopelessness: no Crying spells: no    04/19/2022    1:31 PM 10/19/2021   10:33 AM 07/06/2021    9:33 AM 01/06/2021    8:47 AM 09/24/2020    3:49 PM  Depression screen PHQ 2/9  Decreased Interest 0 0 0 0 1  Down, Depressed, Hopeless 0 0 '1 1 1  '$ PHQ - 2 Score 0 0 '1 1 2  '$ Altered sleeping 0 2 1 0 1  Tired, decreased energy 0 0 0 0 3  Change in appetite 0 0 0 0 1  Feeling bad or failure about yourself  0 0 0 1 1  Trouble concentrating 0 0 '1 1 1  '$ Moving slowly or fidgety/restless 0 0 0 0 1  Suicidal thoughts 0 0 0 0 0  PHQ-9 Score 0 '2 3 3 10  '$ Difficult doing work/chores Not difficult at all Not difficult at all Somewhat difficult Not difficult at all Not difficult at all    DIABETES Hypoglycemic episodes:no Polydipsia/polyuria: no Visual disturbance: no Chest pain: no Paresthesias: no Glucose Monitoring: no Taking Insulin?: no Blood Pressure Monitoring: not checking Retinal Examination: Up to Date Foot Exam: Up to Date Diabetic Education: Completed Pneumovax: Up to Date Influenza: Up to Date Aspirin: no  HYPERTENSION / HYPERLIPIDEMIA Satisfied with current treatment?  yes Duration of hypertension: chronic BP monitoring frequency: not checking BP medication side effects: no Past BP meds: candesartan Duration of hyperlipidemia: chronic Cholesterol medication side effects: not on anything Cholesterol supplements: none Past cholesterol medications: none Medication compliance: excellent compliance Aspirin: no Recent stressors: no Recurrent headaches: yes Visual changes: no Palpitations: no Dyspnea: no Chest pain: no Lower extremity edema: no Dizzy/lightheaded: no  Relevant past medical, surgical, family and social history reviewed and updated as indicated. Interim medical history since our last visit reviewed. Allergies and medications reviewed and updated.  Review of Systems  Constitutional: Negative.   Respiratory: Negative.    Cardiovascular: Negative.   Gastrointestinal: Negative.   Musculoskeletal: Negative.   Psychiatric/Behavioral: Negative.     Per HPI unless specifically indicated above     Objective:    BP 114/75   Pulse 93   Temp 97.9 F (36.6 C) (Oral)   Wt 156 lb 6.4 oz (70.9 kg)   SpO2 99%   BMI 31.59 kg/m   Wt Readings from Last 3 Encounters:  04/19/22 156 lb 6.4 oz (70.9 kg)  04/14/22 153 lb (69.4 kg)  04/07/22 154 lb (69.9 kg)    Physical Exam Vitals and nursing note reviewed.  Constitutional:      General: She is not in acute distress.    Appearance: Normal appearance. She  is not ill-appearing, toxic-appearing or diaphoretic.  HENT:     Head: Normocephalic and atraumatic.     Right Ear: External ear normal.     Left Ear: External ear normal.     Nose: Nose normal.     Mouth/Throat:     Mouth: Mucous membranes are moist.     Pharynx: Oropharynx is clear.  Eyes:     General: No scleral icterus.       Right eye: No discharge.        Left eye: No discharge.     Extraocular Movements: Extraocular movements intact.     Conjunctiva/sclera: Conjunctivae normal.     Pupils: Pupils are equal, round, and  reactive to light.  Cardiovascular:     Rate and Rhythm: Normal rate and regular rhythm.     Pulses: Normal pulses.     Heart sounds: Normal heart sounds. No murmur heard.   No friction rub. No gallop.  Pulmonary:     Effort: Pulmonary effort is normal. No respiratory distress.     Breath sounds: Normal breath sounds. No stridor. No wheezing, rhonchi or rales.  Chest:     Chest wall: No tenderness.  Musculoskeletal:        General: Normal range of motion.     Cervical back: Normal range of motion and neck supple.  Skin:    General: Skin is warm and dry.     Capillary Refill: Capillary refill takes less than 2 seconds.     Coloration: Skin is not jaundiced or pale.     Findings: No bruising, erythema, lesion or rash.  Neurological:     General: No focal deficit present.     Mental Status: She is alert and oriented to person, place, and time. Mental status is at baseline.  Psychiatric:        Mood and Affect: Mood normal.        Behavior: Behavior normal.        Thought Content: Thought content normal.        Judgment: Judgment normal.    Results for orders placed or performed during the hospital encounter of 04/14/22  Pregnancy, urine  Result Value Ref Range   Preg Test, Ur NEGATIVE NEGATIVE  Glucose, CSF  Result Value Ref Range   Glucose, CSF 55 40 - 70 mg/dL  Protein, CSF  Result Value Ref Range   Total  Protein, CSF 20 15 - 45 mg/dL  CSF cell count with differential  Result Value Ref Range   Tube # 3    Color, CSF COLORLESS COLORLESS   Appearance, CSF CLEAR (A) CLEAR   RBC Count, CSF 0 0 - 3 /cu mm   WBC, CSF 7 (H) 0 - 5 /cu mm   Segmented Neutrophils-CSF 0 %   Lymphs, CSF 92 %   Monocyte-Macrophage-Spinal Fluid 8 %   Eosinophils, CSF 0 %  IgG CSF index  Result Value Ref Range   IgG, CSF 1.1 0.0 - 6.7 mg/dL   Albumin CSF-mCnc 12 8 - 37 mg/dL   IgG (Immunoglobin G), Serum 826 586 - 1,602 mg/dL   Albumin 4.3 3.8 - 4.8 g/dL   IgG/Alb Ratio, CSF 0.09 0.00 - 0.25    CSF IgG Index 0.5 0.0 - 0.7      Assessment & Plan:   Problem List Items Addressed This Visit       Cardiovascular and Mediastinum   Migraine without aura and without status migrainosus, not intractable  Not doing well. Increased intracranial pressure on LP. To follow up with neurology. Call with any concerns.        Relevant Medications   naproxen (NAPROSYN) 500 MG tablet     Endocrine   Type 2 diabetes mellitus with hyperglycemia, without long-term current use of insulin ([place])    Doing great at the end of March with A1c of 5.7. Continue current regimen. Continue to monitor. Call with any concerns. Will increase her ozempic to '2mg'$  and decrease her metformin to '750mg'$  and recheck in 6 months. Call with any concerns.        Relevant Medications   Semaglutide, 2 MG/DOSE, 8 MG/3ML SOPN   metFORMIN (GLUCOPHAGE-XR) 750 MG 24 hr tablet     Other   Hyperlipidemia    Under good control on current regimen. Continue current regimen. Continue to monitor. Call with any concerns. Refills given. Labs checked in March and normal. Will hold on labs today.        Depression    Under good control on current regimen. Continue current regimen. Continue to monitor. Call with any concerns. Refills given.         Relevant Medications   busPIRone (BUSPAR) 10 MG tablet   Intracranial pressure increased - Primary    Not doing well. Increased intracranial pressure on LP. To follow up with neurology. Call with any concerns.          Follow up plan: Return in about 6 months (around 10/19/2022) for physical.

## 2022-04-19 NOTE — Assessment & Plan Note (Signed)
Not doing well. Increased intracranial pressure on LP. To follow up with neurology. Call with any concerns.

## 2022-04-19 NOTE — Assessment & Plan Note (Signed)
Under good control on current regimen. Continue current regimen. Continue to monitor. Call with any concerns. Refills given.   

## 2022-04-19 NOTE — Assessment & Plan Note (Signed)
Under good control on current regimen. Continue current regimen. Continue to monitor. Call with any concerns. Refills given. Labs checked in March and normal. Will hold on labs today.

## 2022-04-19 NOTE — Assessment & Plan Note (Signed)
Doing great at the end of March with A1c of 5.7. Continue current regimen. Continue to monitor. Call with any concerns. Will increase her ozempic to '2mg'$  and decrease her metformin to '750mg'$  and recheck in 6 months. Call with any concerns.

## 2022-04-20 LAB — OLIGOCLONAL BANDS, CSF + SERM

## 2022-04-21 LAB — ANGIOTENSIN CONVERTING ENZYME, CSF: Angio Convert Enzyme: <1.5 U/L (ref 0.0–2.5)

## 2022-04-28 ENCOUNTER — Other Ambulatory Visit: Payer: Self-pay

## 2022-04-28 MED ORDER — ACETAZOLAMIDE 125 MG PO TABS
ORAL_TABLET | ORAL | 0 refills | Status: DC
  Filled 2022-04-28: qty 148, 44d supply, fill #0

## 2022-04-28 MED ORDER — ACETAZOLAMIDE 250 MG PO TABS: ORAL_TABLET | ORAL | 5 refills | Status: DC

## 2022-05-01 ENCOUNTER — Other Ambulatory Visit: Payer: Self-pay

## 2022-05-01 MED ORDER — PREGABALIN 50 MG PO CAPS
ORAL_CAPSULE | ORAL | 2 refills | Status: DC
  Filled 2022-05-01: qty 60, 30d supply, fill #0

## 2022-05-12 ENCOUNTER — Encounter: Payer: Self-pay | Admitting: Family Medicine

## 2022-05-12 ENCOUNTER — Ambulatory Visit (INDEPENDENT_AMBULATORY_CARE_PROVIDER_SITE_OTHER): Payer: No Typology Code available for payment source | Admitting: Family Medicine

## 2022-05-12 VITALS — BP 97/67 | HR 86 | Temp 98.2°F | Wt 151.0 lb

## 2022-05-12 DIAGNOSIS — M9909 Segmental and somatic dysfunction of abdomen and other regions: Secondary | ICD-10-CM | POA: Diagnosis not present

## 2022-05-12 DIAGNOSIS — M9905 Segmental and somatic dysfunction of pelvic region: Secondary | ICD-10-CM

## 2022-05-12 DIAGNOSIS — M9901 Segmental and somatic dysfunction of cervical region: Secondary | ICD-10-CM | POA: Diagnosis not present

## 2022-05-12 DIAGNOSIS — M9904 Segmental and somatic dysfunction of sacral region: Secondary | ICD-10-CM

## 2022-05-12 DIAGNOSIS — M9902 Segmental and somatic dysfunction of thoracic region: Secondary | ICD-10-CM

## 2022-05-12 DIAGNOSIS — M9900 Segmental and somatic dysfunction of head region: Secondary | ICD-10-CM

## 2022-05-12 DIAGNOSIS — M9908 Segmental and somatic dysfunction of rib cage: Secondary | ICD-10-CM | POA: Diagnosis not present

## 2022-05-12 DIAGNOSIS — M9903 Segmental and somatic dysfunction of lumbar region: Secondary | ICD-10-CM

## 2022-05-12 DIAGNOSIS — M542 Cervicalgia: Secondary | ICD-10-CM

## 2022-05-12 NOTE — Progress Notes (Signed)
BP 97/67   Pulse 86   Temp 98.2 F (36.8 C) (Oral)   Wt 151 lb (68.5 kg)   LMP 03/15/2022 (Approximate) Comment: neg preg test on 04/14/22  SpO2 100%   BMI 30.50 kg/m    Subjective:    Patient ID: Ashley Wiley, female    DOB: 1976-01-27, 46 y.o.   MRN: [id number]  HPI: Ashley Wiley is a 46 y.o. female  Chief Complaint  Patient presents with   Neck Pain   [name] presents today for evaluation and possible treatment with OMT for upper back and neck pain. She notes that her upper back and neck for about 2 months. Has been a while since she was seen for OMT. Has been having a lot of issues with headaches. Having problems with the diamox. Just titrated up and has been having numbness and tingling and not feeling quite right. She is hoping that things will calm down soon and she'll be feeling better. She notes that her pain is aching and tight. Better with certain positions and stretching and OMT. Worse with stress and certain positions. Pain radiates into her head and into her shoulders. She is otherwise feeling well with no other concerns or complaints at this time.   Relevant past medical, surgical, family and social history reviewed and updated as indicated. Interim medical history since our last visit reviewed. Allergies and medications reviewed and updated.  Review of Systems  Constitutional: Negative.   Respiratory: Negative.    Cardiovascular: Negative.   Gastrointestinal: Negative.   Musculoskeletal:  Positive for back pain, myalgias, neck pain and neck stiffness. Negative for arthralgias, gait problem and joint swelling.  Skin: Negative.   Neurological: Negative.   Psychiatric/Behavioral: Negative.      Per HPI unless specifically indicated above     Objective:    BP 97/67   Pulse 86   Temp 98.2 F (36.8 C) (Oral)   Wt 151 lb (68.5 kg)   LMP 03/15/2022 (Approximate) Comment: neg preg test on 04/14/22  SpO2 100%   BMI 30.50 kg/m   Wt Readings from Last  3 Encounters:  05/12/22 151 lb (68.5 kg)  04/19/22 156 lb 6.4 oz (70.9 kg)  04/14/22 153 lb (69.4 kg)    Physical Exam Vitals and nursing note reviewed.  Constitutional:      General: She is not in acute distress.    Appearance: Normal appearance. She is not ill-appearing.  HENT:     Head: Normocephalic and atraumatic.     Right Ear: External ear normal.     Left Ear: External ear normal.     Nose: Nose normal.     Mouth/Throat:     Mouth: Mucous membranes are moist.     Pharynx: Oropharynx is clear.  Eyes:     Extraocular Movements: Extraocular movements intact.     Conjunctiva/sclera: Conjunctivae normal.     Pupils: Pupils are equal, round, and reactive to light.  Neck:     Vascular: No carotid bruit.  Cardiovascular:     Rate and Rhythm: Normal rate.     Pulses: Normal pulses.  Pulmonary:     Effort: Pulmonary effort is normal. No respiratory distress.  Abdominal:     General: Abdomen is flat. There is no distension.     Palpations: Abdomen is soft. There is no mass.     Tenderness: There is no abdominal tenderness. There is no right CVA tenderness, left CVA tenderness, guarding or rebound.  Hernia: No hernia is present.  Musculoskeletal:     Cervical back: No muscular tenderness.  Lymphadenopathy:     Cervical: No cervical adenopathy.  Skin:    General: Skin is warm and dry.     Capillary Refill: Capillary refill takes less than 2 seconds.     Coloration: Skin is not jaundiced or pale.     Findings: No bruising, erythema, lesion or rash.  Neurological:     General: No focal deficit present.     Mental Status: She is alert. Mental status is at baseline.  Psychiatric:        Mood and Affect: Mood normal.        Behavior: Behavior normal.        Thought Content: Thought content normal.        Judgment: Judgment normal.    Musculoskeletal:  Exam found Decreased ROM, Tissue texture changes, Tenderness to palpation, and Asymmetry of patient's  head, neck,  thorax, ribs, lumbar, pelvis, sacrum, and abdomen Osteopathic Structural Exam:   Head: OAESSR, hypertonic suboccipital muscles   Neck: C4ESRL  Thorax: T3-5SLRR  Ribs: Ribs 4-8 locked up on the R, Rib 6 locked up on the L  Lumbar: QL hypertonic on the R, L3-5SLRR, psoas hypertonic on the R  Pelvis: posterior R innominate  Sacrum: R on R torsion  Abdomen: diaphragm hypertonic bilaterally R>L  Results for orders placed or performed during the hospital encounter of 04/14/22  Pregnancy, urine  Result Value Ref Range   Preg Test, Ur NEGATIVE NEGATIVE  Angiotensin converting enzyme, CSF  Result Value Ref Range   Angio Convert Enzyme <1.5 0.0 - 2.5 U/L  Glucose, CSF  Result Value Ref Range   Glucose, CSF 55 40 - 70 mg/dL  Protein, CSF  Result Value Ref Range   Total  Protein, CSF 20 15 - 45 mg/dL  CSF cell count with differential  Result Value Ref Range   Tube # 3    Color, CSF COLORLESS COLORLESS   Appearance, CSF CLEAR (A) CLEAR   RBC Count, CSF 0 0 - 3 /cu mm   WBC, CSF 7 (H) 0 - 5 /cu mm   Segmented Neutrophils-CSF 0 %   Lymphs, CSF 92 %   Monocyte-Macrophage-Spinal Fluid 8 %   Eosinophils, CSF 0 %  Oligoclonal bands, CSF + serm  Result Value Ref Range   CSF Oligoclonal Bands Comment   IgG CSF index  Result Value Ref Range   IgG, CSF 1.1 0.0 - 6.7 mg/dL   Albumin CSF-mCnc 12 8 - 37 mg/dL   IgG (Immunoglobin G), Serum 826 586 - 1,602 mg/dL   Albumin 4.3 3.8 - 4.8 g/dL   IgG/Alb Ratio, CSF 6.57 0.00 - 0.25   CSF IgG Index 0.5 0.0 - 0.7      Assessment & Plan:   Problem List Items Addressed This Visit   None Visit Diagnoses     Neck pain    -  Primary   In acute exacerbation due to myofascial issues. She does have somatic dysfunction that is contributing to her symptoms. Treated with good results as below.    Head region somatic dysfunction       Cervical segment dysfunction       Thoracic segment dysfunction       Somatic dysfunction of lumbar region        Somatic dysfunction of sacral region       Somatic dysfunction of pelvis region  Rib cage region somatic dysfunction       Segmental dysfunction of abdomen          After verbal consent was obtained, patient was treated today with osteopathic manipulative medicine to the regions of the head, neck, thorax, ribs, lumbar, pelvis, sacrum, and abdomen using the techniques of cranial, myofascial release, counterstrain, muscle energy, HVLA, and soft tissue. Areas of compensation relating to her primary pain source also treated. Patient tolerated the procedure well with good objective and good subjective improvement in symptoms.  She left the room in good condition. She was advised to stay well hydrated and that she may have some soreness following the procedure. If not improving or worsening, she will call and come in. She will return for reevaluation  on a PRN basis.   Follow up plan: Return if symptoms worsen or fail to improve.

## 2022-05-15 ENCOUNTER — Encounter: Payer: Self-pay | Admitting: Family Medicine

## 2022-06-02 ENCOUNTER — Ambulatory Visit (INDEPENDENT_AMBULATORY_CARE_PROVIDER_SITE_OTHER): Payer: No Typology Code available for payment source | Admitting: Nurse Practitioner

## 2022-06-02 ENCOUNTER — Encounter: Payer: Self-pay | Admitting: Nurse Practitioner

## 2022-06-02 DIAGNOSIS — H00013 Hordeolum externum right eye, unspecified eyelid: Secondary | ICD-10-CM | POA: Insufficient documentation

## 2022-06-02 DIAGNOSIS — H00011 Hordeolum externum right upper eyelid: Secondary | ICD-10-CM | POA: Diagnosis not present

## 2022-06-02 MED ORDER — ERYTHROMYCIN 5 MG/GM OP OINT: 1.0000 | TOPICAL_OINTMENT | Freq: Every day | OPHTHALMIC | 0 refills | Status: AC

## 2022-06-02 NOTE — Patient Instructions (Signed)
Stye ?A stye, also known as a hordeolum, is a bump that forms on an eyelid. It may look like a pimple next to the eyelash. A stye can form inside the eyelid (internal stye) or outside the eyelid (external stye). A stye can cause redness, swelling, and pain on the eyelid. ?Styes are very common. Anyone can get them at any age. They usually occur in just one eye at a time, but you may have more than one in either eye. ?What are the causes? ?A stye is caused by an infection. The infection is almost always caused by bacteria called Staphylococcus aureus. This is a common type of bacteria that lives on the skin. ?An internal stye may result from an infected oil-producing gland inside the eyelid. An external stye may be caused by an infection at the base of the eyelash (hair follicle). ?What increases the risk? ?You are more likely to develop a stye if: ?You have had a stye before. ?You have any of these conditions: ?Red, itchy, inflamed eyelids (blepharitis). ?A skin condition such as seborrheic dermatitis or rosacea. ?High fat levels in your blood (lipids). ?Dry eyes. ?What are the signs or symptoms? ?The most common symptom of a stye is eyelid pain. Internal styes are more painful than external styes. Other symptoms may include: ?Painful swelling of your eyelid. ?A scratchy feeling in your eye. ?Tearing and redness of your eye. ?A pimple-like bump on the edge of the eyelid. ?Pus draining from the stye. ?How is this diagnosed? ?Your health care provider may be able to diagnose a stye just by examining your eye. The health care provider may also check to make sure: ?You do not have a fever or other signs of a more serious infection. ?The infection has not spread to other parts of your eye or areas around your eye. ?How is this treated? ?Most styes will clear up in a few days without treatment or with warm compresses applied to the area. You may need to use antibiotic drops or ointment to treat an infection. Sometimes,  steroid drops or ointment are used in addition to antibiotics. ?In some cases, your health care provider may give you a small steroid injection in the eyelid. ?If your stye does not heal with routine treatment, your health care provider may drain pus from the stye using a thin blade or needle. This may be done if the stye is large, causing a lot of pain, or affecting your vision. ?Follow these instructions at home: ?Take over-the-counter and prescription medicines only as told by your health care provider. This includes eye drops or ointments. ?If you were prescribed an antibiotic medicine, steroid medicine, or both, apply or use them as told by your health care provider. Do not stop using the medicine even if your condition improves. ?Apply a warm, wet cloth (warm compress) to your eye for 5-10 minutes, 4 to 6 times a day. ?Clean the affected eyelid as directed by your health care provider. ?Do not wear contact lenses or eye makeup until your stye has healed and your health care provider says that it is safe. ?Do not try to pop or drain the stye. ?Do not rub your eye. ?Contact a health care provider if: ?You have chills or a fever. ?Your stye does not go away after several days. ?Your stye affects your vision. ?Your eyeball becomes swollen, red, or painful. ?Get help right away if: ?You have pain when moving your eye around. ?Summary ?A stye is a bump that forms   on an eyelid. It may look like a pimple next to the eyelash. ?A stye can form inside the eyelid (internal stye) or outside the eyelid (external stye). A stye can cause redness, swelling, and pain on the eyelid. ?Your health care provider may be able to diagnose a stye just by examining your eye. ?Apply a warm, wet cloth (warm compress) to your eye for 5-10 minutes, 4 to 6 times a day. ?This information is not intended to replace advice given to you by your health care provider. Make sure you discuss any questions you have with your health care  provider. ?Document Revised: 01/12/2021 Document Reviewed: 01/12/2021 ?[employer]vier Patient Education ? 2023 [employer] ? ?

## 2022-06-02 NOTE — Progress Notes (Signed)
BP 122/78   Pulse 67   Temp 97.6 F (36.4 C) (Oral)   Ht '4\' 11"'$  (1.499 m)   Wt 150 lb (68 kg)   SpO2 98%   BMI 30.30 kg/m    Subjective:    Patient ID: Ashley Wiley, female    DOB: [birth year], 46 y.o.   MRN: [id number]  HPI: Ashley Wiley is a 46 y.o. female  Chief Complaint  Patient presents with   Stye    Patient is here for Stye on her R upper eyelid. Patient says she has tried warm compress and says the eyelid swell up really bad. Patient says yesterday was very painful and today is it itchy.    EYE PAIN Has a stye to right upper eyelid, started on Tuesday with irritation.  Used warm compresses and this caused it to get larger.  Yesterday was painful and today is itchy. Duration:  days Involved eye:  right Onset: sudden Severity: 7/10  Quality: sharp, aching, and throbbing Foreign body sensation:no Visual impairment: no Eye redness: no Discharge: no Crusting or matting of eyelids: no Swelling: yes Photophobia: no Itching: yes Tearing: yes Headache: has had some headaches and body aches Floaters: no URI symptoms: possible Contact lens use: yes -- has kept them out Close contacts with similar problems: no Eye trauma: no Aggravating factors:  Alleviating factors:  Status: fluctuating Treatments attempted: warm compresses  Relevant past medical, surgical, family and social history reviewed and updated as indicated. Interim medical history since our last visit reviewed. Allergies and medications reviewed and updated.  Review of Systems  Constitutional:  Negative for activity change, appetite change, diaphoresis, fatigue and fever.  Eyes:  Positive for itching. Negative for photophobia, pain, discharge, redness and visual disturbance.  Respiratory:  Negative for cough, chest tightness and shortness of breath.   Cardiovascular:  Negative for chest pain, palpitations and leg swelling.  Neurological: Negative.   Psychiatric/Behavioral: Negative.      Per HPI unless specifically indicated above     Objective:    BP 122/78   Pulse 67   Temp 97.6 F (36.4 C) (Oral)   Ht '4\' 11"'$  (1.499 m)   Wt 150 lb (68 kg)   SpO2 98%   BMI 30.30 kg/m   Wt Readings from Last 3 Encounters:  06/02/22 150 lb (68 kg)  05/12/22 151 lb (68.5 kg)  04/19/22 156 lb 6.4 oz (70.9 kg)    Physical Exam Vitals and nursing note reviewed.  Constitutional:      General: She is awake. She is not in acute distress.    Appearance: She is well-developed and well-groomed. She is not ill-appearing or toxic-appearing.  HENT:     Head: Normocephalic.     Right Ear: Hearing, tympanic membrane, ear canal and external ear normal.     Left Ear: Hearing, tympanic membrane, ear canal and external ear normal.     Nose: Nose normal. No rhinorrhea.     Mouth/Throat:     Mouth: Mucous membranes are moist.     Pharynx: No pharyngeal swelling, oropharyngeal exudate or posterior oropharyngeal erythema.  Eyes:     General: Lids are normal. No visual field deficit.       Right eye: Hordeolum (midline aspect upper lid) present. No foreign body or discharge.        Left eye: No foreign body, discharge or hordeolum.     Extraocular Movements: Extraocular movements intact.     Conjunctiva/sclera: Conjunctivae normal.  Pupils: Pupils are equal, round, and reactive to light.  Neck:     Thyroid: No thyromegaly.     Vascular: No carotid bruit.  Cardiovascular:     Rate and Rhythm: Normal rate and regular rhythm.     Heart sounds: Normal heart sounds. No murmur heard.    No gallop.  Pulmonary:     Effort: Pulmonary effort is normal. No accessory muscle usage or respiratory distress.     Breath sounds: Normal breath sounds.  Abdominal:     General: Bowel sounds are normal.     Palpations: Abdomen is soft. There is no hepatomegaly or splenomegaly.  Musculoskeletal:     Cervical back: Normal range of motion and neck supple.     Right lower leg: No edema.     Left lower  leg: No edema.  Skin:    General: Skin is warm and dry.  Neurological:     Mental Status: She is alert and oriented to person, place, and time.  Psychiatric:        Attention and Perception: Attention normal.        Mood and Affect: Mood normal.        Speech: Speech normal.        Behavior: Behavior normal. Behavior is cooperative.        Thought Content: Thought content normal.    Results for orders placed or performed during the hospital encounter of 04/14/22  Pregnancy, urine  Result Value Ref Range   Preg Test, Ur NEGATIVE NEGATIVE  Angiotensin converting enzyme, CSF  Result Value Ref Range   Angio Convert Enzyme <1.5 0.0 - 2.5 U/L  Glucose, CSF  Result Value Ref Range   Glucose, CSF 55 40 - 70 mg/dL  Protein, CSF  Result Value Ref Range   Total  Protein, CSF 20 15 - 45 mg/dL  CSF cell count with differential  Result Value Ref Range   Tube # 3    Color, CSF COLORLESS COLORLESS   Appearance, CSF CLEAR (A) CLEAR   RBC Count, CSF 0 0 - 3 /cu mm   WBC, CSF 7 (H) 0 - 5 /cu mm   Segmented Neutrophils-CSF 0 %   Lymphs, CSF 92 %   Monocyte-Macrophage-Spinal Fluid 8 %   Eosinophils, CSF 0 %  Oligoclonal bands, CSF + serm  Result Value Ref Range   CSF Oligoclonal Bands Comment   IgG CSF index  Result Value Ref Range   IgG, CSF 1.1 0.0 - 6.7 mg/dL   Albumin CSF-mCnc 12 8 - 37 mg/dL   IgG (Immunoglobin G), Serum 826 586 - 1,602 mg/dL   Albumin 4.3 3.8 - 4.8 g/dL   IgG/Alb Ratio, CSF 0.09 0.00 - 0.25   CSF IgG Index 0.5 0.0 - 0.7      Assessment & Plan:   Problem List Items Addressed This Visit       Other   Hordeolum of right eye    Acute for a few days.  No improvement with at home warm compresses.  At this time no red flags.  Will send in Erythromycin ointment and continue this for 7 days.  Maintain contacts out until improved.  Continue warm compresses.  Return as needed.        Follow up plan: Return if symptoms worsen or fail to improve.

## 2022-06-02 NOTE — Assessment & Plan Note (Signed)
Acute for a few days.  No improvement with at home warm compresses.  At this time no red flags.  Will send in Erythromycin ointment and continue this for 7 days.  Maintain contacts out until improved.  Continue warm compresses.  Return as needed.

## 2022-06-28 ENCOUNTER — Encounter (INDEPENDENT_AMBULATORY_CARE_PROVIDER_SITE_OTHER): Payer: Self-pay

## 2022-07-03 LAB — HM DIABETES EYE EXAM

## 2022-07-19 ENCOUNTER — Other Ambulatory Visit: Payer: Self-pay

## 2022-07-19 MED ORDER — FUROSEMIDE 20 MG PO TABS
ORAL_TABLET | ORAL | 3 refills | Status: DC
  Filled 2022-07-19: qty 30, 30d supply, fill #0
  Filled 2022-09-01: qty 30, 30d supply, fill #1
  Filled 2022-10-11: qty 30, 30d supply, fill #2

## 2022-08-08 ENCOUNTER — Other Ambulatory Visit: Payer: Self-pay | Admitting: Family Medicine

## 2022-08-08 ENCOUNTER — Other Ambulatory Visit: Payer: Self-pay

## 2022-08-09 ENCOUNTER — Other Ambulatory Visit: Payer: Self-pay

## 2022-08-09 NOTE — Telephone Encounter (Signed)
Not on current med list. Requested Prescriptions  Pending Prescriptions Disp Refills  . fluticasone (FLONASE) 50 MCG/ACT nasal spray 16 g 12    Sig: PLACE 2 SPRAYS INTO BOTH NOSTRILS DAILY AS NEEDED FOR ALLERGIES.     Ear, Nose, and Throat: Nasal Preparations - Corticosteroids Passed - 08/08/2022  4:17 PM      Passed - Valid encounter within last 12 months    Recent Outpatient Visits          2 months ago Hordeolum externum of right upper eyelid   [place], [place] T, NP   2 months ago Neck pain   [facility] [place], [place], DO   3 months ago Intracranial pressure increased   [place], Megan P, DO   4 months ago [place], PA-C   7 months ago Abrasion of right cornea, initial encounter   [facility] Valerie Roys, DO      Future Appointments            In 1 month Brendolyn Patty, MD [place]   In 2 months Wynetta Emery, Barb Merino, DO [facility] [place]           . Glucose Blood (BLOOD GLUCOSE TEST STRIPS) STRP [Pharmacy Med Name: glucose blood test strip] 100 strip 12    Sig: CHECK BLOOD SUGAR DAILY     Endocrinology: Diabetes - Testing Supplies Passed - 08/08/2022  4:17 PM      Passed - Valid encounter within last 12 months    Recent Outpatient Visits          2 months ago Hordeolum externum of right upper eyelid   [place], [place] T, NP   2 months ago Neck pain   [place], [place], DO   3 months ago Intracranial pressure increased   [place], Megan P, DO   4 months ago [place], PA-C   7 months ago Abrasion of right cornea, initial encounter   [facility] Valerie Roys, DO      Future Appointments            In 1 month Brendolyn Patty, MD [place]   In 2 months Wynetta Emery, Barb Merino, DO [facility] [place]

## 2022-08-10 ENCOUNTER — Other Ambulatory Visit: Payer: Self-pay

## 2022-08-11 ENCOUNTER — Other Ambulatory Visit: Payer: Self-pay

## 2022-09-01 ENCOUNTER — Other Ambulatory Visit: Payer: Self-pay

## 2022-09-01 MED ORDER — FREESTYLE LITE TEST VI STRP
1.0000 | ORAL_STRIP | Freq: Two times a day (BID) | 12 refills | Status: DC
  Filled 2022-09-01: qty 100, 50d supply, fill #0

## 2022-09-01 MED ORDER — FLUTICASONE PROPIONATE 50 MCG/ACT NA SUSP
2.0000 | Freq: Every day | NASAL | 5 refills | Status: DC
  Filled 2022-09-01: qty 16, 30d supply, fill #0

## 2022-09-18 ENCOUNTER — Ambulatory Visit: Payer: No Typology Code available for payment source | Admitting: Dermatology

## 2022-09-19 ENCOUNTER — Ambulatory Visit: Payer: No Typology Code available for payment source | Admitting: Dermatology

## 2022-09-20 ENCOUNTER — Other Ambulatory Visit: Payer: Self-pay

## 2022-09-20 ENCOUNTER — Ambulatory Visit: Payer: No Typology Code available for payment source | Admitting: Dermatology

## 2022-09-20 DIAGNOSIS — L811 Chloasma: Secondary | ICD-10-CM

## 2022-09-20 DIAGNOSIS — B351 Tinea unguium: Secondary | ICD-10-CM | POA: Diagnosis not present

## 2022-09-20 MED ORDER — FINACEA 15 % EX FOAM
CUTANEOUS | 5 refills | Status: DC
  Filled 2022-09-20: qty 50, 30d supply, fill #0

## 2022-09-20 MED ORDER — TAVABOROLE 5 % EX SOLN: CUTANEOUS | 3 refills | Status: DC

## 2022-09-20 NOTE — Progress Notes (Signed)
   Follow-Up Visit   Subjective  Ashley Wiley is a 46 y.o. female who presents for the following: Follow-up.  Patient presents for 6 month follow-up Melasma of the face. She is much improved only using sunscreen daily. Not currently using Finacea Foam. No improvement with INKEY. She also has tinea unguium, but is wearing nail polish today. She states much better after terbinafine treatment and she is currently using tavaborole solution.   The following portions of the chart were reviewed this encounter and updated as appropriate:       Review of Systems:  No other skin or systemic complaints except as noted in HPI or Assessment and Plan.  Objective  Well appearing patient in no apparent distress; mood and affect are within normal limits.  A focused examination was performed including face. Relevant physical exam findings are noted in the Assessment and Plan.  face Reticulated hyperpigmented patches. Not change when compared to photos, but no worsening  L great toenail Not examined today, patient with polish on nail. Much improved per patient.    Assessment & Plan  Melasma face  Chronic and persistent condition with duration or expected duration over one year. Condition is symptomatic/ bothersome to patient. Not currently at goal. But no worsening despite break from Rx topicals. Pt continues regular sunscreen use.  Melasma is a chronic; persistent condition of hyperpigmented patches generally on the face, worse in summer due to higher UV exposure.    Heredity; thyroid disease; sun exposure; pregnancy; birth control pills; epilepsy medication and darker skin may predispose to Melasma.   Recommendations include: - Sun avoidance and daily broad spectrum (UVA/UVB) sunscreen SPF 30+, preferably with Zinc or Titanium Dioxide. - Rx topical bleaching creams (i.e. hydroquinone) is a common treatment but should not be used long term.  Hydroquinones may be mixed with retinoids; steroids;  Kojic Acid. - Rx Azelaic Acid is also a treatment option that is safe for pregnancy (Category B). - OTC Heliocare can be helpful in control and prevention. - Oral Rx with Tranexamic Acid 250 mg - 650 mg po daily can be used for moderate to severe cases especially during summer (contraindications include pregnancy; lactation; hx of PE; DVT; clotting disorder; heart disease; anticoagulant use and upcoming long trips)   - Chemical peels (would need multiple for best result).  - Lasers and  Microdermabrasion may also be helpful adjunct treatments.  Discussed Alastin A-Luminate - may purchase in office if interested. Recommend topical Vitamin C (La Roche-Posay (sample), The Ordinary, or Alastin Vit C) May restart Finacea Foam BID.  In AM - Finacea Foam, Vit C, sunscreen with zinc In PM - Finacea Foam, moisturizer Continue Heliocare PO prn sun exposure Discussed chemical peels, pt declines at this time   Azelaic Acid (FINACEA) 15 % FOAM - face Apply to face once to twice daily as directed.  Tinea unguium L great toenail  Improved post PO terbinafine treatment.  Continue Tavaborole solution to toenail qhs 5Rf. Sent to [place].  Tavaborole (KERYDIN) 5 % SOLN - L great toenail Apply to affected toenail nightly   Return in about 6 months (around 03/21/2023) for melasma.  Documentation: I have reviewed the above documentation for accuracy and completeness, and I agree with the above.  Brendolyn Patty MD

## 2022-09-20 NOTE — Patient Instructions (Addendum)
In AM - Finacea Foam, Vit C, sunscreen with zinc In PM - Finacea Foam, moisturizer  Melasma is a chronic; persistent condition of hyperpigmented patches generally on the face, worse in summer due to higher UV exposure.    Heredity; thyroid disease; sun exposure; pregnancy; birth control pills; epilepsy medication and darker skin may predispose to Melasma.   Recommendations include: - Sun avoidance and daily broad spectrum (UVA/UVB) sunscreen SPF 30+, preferably with Zinc or Titanium Dioxide. - Rx topical bleaching creams (i.e. hydroquinone) is a common treatment but should not be used long term.  Hydroquinones may be mixed with retinoids; steroids; Kojic Acid. - Rx Azelaic Acid is also a treatment option that is safe for pregnancy (Category B). - OTC Heliocare can be helpful in control and prevention. - Oral Rx with Tranexamic Acid 250 mg - 650 mg po daily can be used for moderate to severe cases especially during summer (contraindications include pregnancy; lactation; hx of PE; DVT; clotting disorder; heart disease; anticoagulant use and upcoming long trips)   - Chemical peels (would need multiple for best result).  - Lasers and  Microdermabrasion may also be helpful adjunct treatments.    Due to recent changes in healthcare laws, you may see results of your pathology and/or laboratory studies on MyChart before the doctors have had a chance to review them. We understand that in some cases there may be results that are confusing or concerning to you. Please understand that not all results are received at the same time and often the doctors may need to interpret multiple results in order to provide you with the best plan of care or course of treatment. Therefore, we ask that you please give [place] 2 business days to thoroughly review all your results before contacting the office for clarification. Should we see a critical lab result, you will be contacted sooner.   If You Need Anything After Your  Visit  If you have any questions or concerns for your doctor, please call our main line at [phone] and press option 4 to reach your doctor's medical assistant. If no one answers, please leave a voicemail as directed and we will return your call as soon as possible. Messages left after 4 pm will be answered the following business day.   You may also send [place] a message via [place]. We typically respond to MyChart messages within 1-2 business days.  For prescription refills, please ask your pharmacy to contact our office. Our fax number is [phone].  If you have an urgent issue when the clinic is closed that cannot wait until the next business day, you can page your doctor at the number below.    Please note that while we do our best to be available for urgent issues outside of office hours, we are not available 24/7.   If you have an urgent issue and are unable to reach [place], you may choose to seek medical care at your doctor's office, retail clinic, urgent care center, or emergency room.  If you have a medical emergency, please immediately call 911 or go to the emergency department.  Pager Numbers  - Dr. Nehemiah Massed: [phone]  - Dr. Laurence Ferrari: [phone]  - Dr. Nicole Kindred: [phone]  In the event of inclement weather, please call our main line at [phone] for an update on the status of any delays or closures.  Dermatology Medication Tips: Please keep the boxes that topical medications come in in order to help keep track of the instructions about where  and how to use these. Pharmacies typically print the medication instructions only on the boxes and not directly on the medication tubes.   If your medication is too expensive, please contact our office at [phone] option 4 or send [place] a message through [place].   We are unable to tell what your co-pay for medications will be in advance as this is different depending on your insurance coverage. However, we may be able to find a  substitute medication at lower cost or fill out paperwork to get insurance to cover a needed medication.   If a prior authorization is required to get your medication covered by your insurance company, please allow [place] 1-2 business days to complete this process.  Drug prices often vary depending on where the prescription is filled and some pharmacies may offer cheaper prices.  The website www.goodrx.com contains coupons for medications through different pharmacies. The prices here do not account for what the cost may be with help from insurance (it may be cheaper with your insurance), but the website can give you the price if you did not use any insurance.  - You can print the associated coupon and take it with your prescription to the pharmacy.  - You may also stop by our office during regular business hours and pick up a GoodRx coupon card.  - If you need your prescription sent electronically to a different pharmacy, notify our office through [facility] or by phone at [phone] option 4.     Si Usted Necesita Algo Despus de Su Visita  Tambin puede enviarnos un mensaje a travs de Pharmacist, community. Por lo general respondemos a los mensajes de MyChart en el transcurso de 1 a 2 das hbiles.  Para renovar recetas, por favor pida a su farmacia que se ponga en contacto con nuestra oficina. Harland Dingwall de fax es [place] [phone].  Si tiene un asunto urgente cuando la clnica est cerrada y que no puede esperar hasta el siguiente da hbil, puede llamar/localizar a su doctor(a) al nmero que aparece a continuacin.   Por favor, tenga en cuenta que aunque hacemos todo lo posible para estar disponibles para asuntos urgentes fuera del horario de [place], no estamos disponibles las 24 horas del da, los 7 das de la [place].   Si tiene un problema urgente y no puede comunicarse con nosotros, puede optar por buscar atencin mdica  en el consultorio de su doctor(a), en una clnica privada, en un  centro de atencin urgente o en una sala de emergencias.  Si tiene Engineering geologist, por favor llame inmediatamente al 911 o vaya a la sala de emergencias.  Nmeros de bper  - Dr. Nehemiah Massed: [phone]  - Dra. Moye: [phone]  - Dra. Nicole Kindred: [phone]  En caso de inclemencias del [place], por favor llame a Johnsie Kindred principal al [phone] para una actualizacin sobre el [place] de cualquier retraso o cierre.  Consejos para la medicacin en dermatologa: Por favor, guarde las cajas en las que vienen los medicamentos de uso tpico para ayudarle a seguir las instrucciones sobre dnde y cmo usarlos. Las farmacias generalmente imprimen las instrucciones del medicamento slo en las cajas y no directamente en los tubos del [place].   Si su medicamento es muy caro, por favor, pngase en contacto con Zigmund Daniel llamando al [phone] y presione la opcin 4 o envenos un mensaje a travs de Pharmacist, community.   No podemos decirle cul ser su copago por los medicamentos por adelantado ya que esto es  diferente dependiendo de la cobertura de su seguro. Sin embargo, es posible que podamos encontrar un medicamento sustituto a Electrical engineer un formulario para que el seguro cubra el medicamento que se considera necesario.   Si se requiere una autorizacin previa para que su compaa de seguros [place] su medicamento, por favor permtanos de 1 a 2 das hbiles para completar este proceso.  Los precios de los medicamentos varan con frecuencia dependiendo del Environmental consultant de dnde se surte la receta y alguna farmacias pueden ofrecer precios ms baratos.  El sitio web www.goodrx.com tiene cupones para medicamentos de Airline pilot. Los precios aqu no tienen en cuenta lo que podra costar con la ayuda del seguro (puede ser ms barato con su seguro), pero el sitio web puede darle el precio si no utiliz Research scientist (physical sciences).  - Puede imprimir el cupn correspondiente y llevarlo con su receta  a la farmacia.  - Tambin puede pasar por nuestra oficina durante el horario de atencin regular y Charity fundraiser una tarjeta de cupones de GoodRx.  - Si necesita que su receta se enve electrnicamente a una farmacia diferente, informe a nuestra oficina a travs de MyChart de [place] o por telfono llamando al [phone] y presione la opcin 4.

## 2022-09-21 ENCOUNTER — Other Ambulatory Visit: Payer: Self-pay

## 2022-09-22 ENCOUNTER — Other Ambulatory Visit: Payer: Self-pay

## 2022-10-06 ENCOUNTER — Ambulatory Visit
Admission: RE | Admit: 2022-10-06 | Discharge: 2022-10-06 | Disposition: A | Discharge status: Home / Self Care | Payer: No Typology Code available for payment source | Source: Ambulatory Visit | Attending: Family Medicine | Admitting: Family Medicine

## 2022-10-06 DIAGNOSIS — Z1231 Encounter for screening mammogram for malignant neoplasm of breast: Secondary | ICD-10-CM | POA: Diagnosis present

## 2022-10-10 ENCOUNTER — Other Ambulatory Visit: Payer: Self-pay | Admitting: Family Medicine

## 2022-10-10 DIAGNOSIS — R928 Other abnormal and inconclusive findings on diagnostic imaging of breast: Secondary | ICD-10-CM

## 2022-10-10 DIAGNOSIS — N6489 Other specified disorders of breast: Secondary | ICD-10-CM

## 2022-10-11 ENCOUNTER — Other Ambulatory Visit: Payer: Self-pay

## 2022-10-19 ENCOUNTER — Ambulatory Visit
Admission: RE | Admit: 2022-10-19 | Discharge: 2022-10-19 | Disposition: A | Discharge status: Home / Self Care | Payer: No Typology Code available for payment source | Source: Ambulatory Visit | Attending: Family Medicine | Admitting: Family Medicine

## 2022-10-19 DIAGNOSIS — N6489 Other specified disorders of breast: Secondary | ICD-10-CM | POA: Diagnosis present

## 2022-10-19 DIAGNOSIS — R928 Other abnormal and inconclusive findings on diagnostic imaging of breast: Secondary | ICD-10-CM

## 2022-10-20 ENCOUNTER — Telehealth: Payer: Self-pay

## 2022-10-20 NOTE — Telephone Encounter (Signed)
Prior authorization was initiated via CoverMyMeds for prescription of Saxenda 18 MG. Awaiting determination from insurance.  KEY: [id number]

## 2022-10-25 NOTE — Telephone Encounter (Signed)
Per Dr.Johnson, patient will need an appointment to further discuss current treatment options.

## 2022-10-27 ENCOUNTER — Other Ambulatory Visit: Payer: Self-pay

## 2022-10-31 ENCOUNTER — Other Ambulatory Visit: Payer: No Typology Code available for payment source

## 2022-11-01 ENCOUNTER — Ambulatory Visit (INDEPENDENT_AMBULATORY_CARE_PROVIDER_SITE_OTHER): Payer: No Typology Code available for payment source | Admitting: Family Medicine

## 2022-11-01 ENCOUNTER — Other Ambulatory Visit: Payer: Self-pay

## 2022-11-01 ENCOUNTER — Encounter: Payer: Self-pay | Admitting: Family Medicine

## 2022-11-01 VITALS — BP 103/67 | HR 69 | Temp 97.8°F | Ht 60.0 in | Wt 145.0 lb

## 2022-11-01 DIAGNOSIS — F332 Major depressive disorder, recurrent severe without psychotic features: Secondary | ICD-10-CM | POA: Diagnosis not present

## 2022-11-01 DIAGNOSIS — Z0000 Encounter for general adult medical examination without abnormal findings: Secondary | ICD-10-CM

## 2022-11-01 DIAGNOSIS — E782 Mixed hyperlipidemia: Secondary | ICD-10-CM

## 2022-11-01 DIAGNOSIS — E1165 Type 2 diabetes mellitus with hyperglycemia: Secondary | ICD-10-CM | POA: Diagnosis not present

## 2022-11-01 DIAGNOSIS — Z1211 Encounter for screening for malignant neoplasm of colon: Secondary | ICD-10-CM | POA: Diagnosis not present

## 2022-11-01 DIAGNOSIS — E559 Vitamin D deficiency, unspecified: Secondary | ICD-10-CM

## 2022-11-01 LAB — BAYER DCA HB A1C WAIVED: HB A1C (BAYER DCA - WAIVED): 5 % (ref 4.8–5.6)

## 2022-11-01 MED ORDER — BUSPIRONE HCL 10 MG PO TABS
10.0000 mg | ORAL_TABLET | Freq: Three times a day (TID) | ORAL | 1 refills | Status: DC | PRN
  Filled 2022-11-01: qty 270, 60d supply, fill #0

## 2022-11-01 MED ORDER — METFORMIN HCL ER 750 MG PO TB24
750.0000 mg | ORAL_TABLET | Freq: Every day | ORAL | 1 refills | Status: DC
  Filled 2022-11-01 – 2023-02-26 (×2): qty 90, 90d supply, fill #0

## 2022-11-01 MED ORDER — FUROSEMIDE 20 MG PO TABS
ORAL_TABLET | ORAL | 3 refills | Status: DC
  Filled 2022-11-01: qty 30, fill #0
  Filled 2022-12-15: qty 30, 30d supply, fill #0
  Filled 2023-02-26: qty 30, 30d supply, fill #1
  Filled 2023-04-04: qty 30, 30d supply, fill #2
  Filled 2023-05-09: qty 30, 30d supply, fill #3

## 2022-11-01 MED ORDER — NAPROXEN 500 MG PO TABS
500.0000 mg | ORAL_TABLET | Freq: Two times a day (BID) | ORAL | 1 refills | Status: DC
  Filled 2022-11-01: qty 180, 90d supply, fill #0

## 2022-11-01 MED ORDER — SEMAGLUTIDE (2 MG/DOSE) 8 MG/3ML ~~LOC~~ SOPN
2.0000 mg | PEN_INJECTOR | SUBCUTANEOUS | 1 refills | Status: DC
  Filled 2022-11-01: qty 9, 84d supply, fill #0
  Filled 2023-03-12: qty 3, 28d supply, fill #0
  Filled 2023-05-09: qty 3, 28d supply, fill #1

## 2022-11-01 NOTE — Assessment & Plan Note (Signed)
Doing great with A1c of 5.0. Continue current regimen. Continue to monitor. Call with any concerns.

## 2022-11-01 NOTE — Progress Notes (Signed)
BP 103/67   Pulse 69   Temp 97.8 F (36.6 C) (Oral)   Ht 5' (1.524 m)   Wt 145 lb (65.8 kg)   LMP 09/28/2022   SpO2 99%   BMI 28.32 kg/m    Subjective:    Patient ID: Ashley Wiley, female    DOB: 03-10-1976, 46 y.o.   MRN: [id number]  HPI: Ashley Wiley is a 46 y.o. female presenting on 11/01/2022 for comprehensive medical examination. Current medical complaints include:  DIABETES Hypoglycemic episodes:no Polydipsia/polyuria: no Visual disturbance: no Chest pain: no Paresthesias: no Glucose Monitoring: yes  Accucheck frequency:  occasionally Taking Insulin?: no Blood Pressure Monitoring: not checking Retinal Examination: Up to Date Foot Exam: Up to Date Diabetic Education: Completed Pneumovax: Up to Date Influenza: Up to Date Aspirin: no  HYPERLIPIDEMIA Hyperlipidemia status: excellent compliance Satisfied with current treatment?  yes Side effects:  not on anything Medication compliance: N/A Past cholesterol meds: none Supplements: none Aspirin:  no The 10-year ASCVD risk score (Arnett DK, et al., 2019) is: 1.4%   Values used to calculate the score:     Age: 80 years     Sex: Female     Is Non-Hispanic African American: No     Diabetic: Yes     Tobacco smoker: No     Systolic Blood Pressure: 160 mmHg     Is BP treated: No     HDL Cholesterol: 55 mg/dL     Total Cholesterol: 236 mg/dL Chest pain:  no Coronary artery disease:  no Family history CAD:  yes  DEPRESSION Mood status: stable Satisfied with current treatment?: yes Symptom severity: mild  Duration of current treatment :  not currently on anything Side effects: no Medication compliance: N/A Psychotherapy/counseling: yes in the past Depressed mood: no Anxious mood: no Anhedonia: no Significant weight loss or gain: no Insomnia: no  Fatigue: no Feelings of worthlessness or guilt: no Impaired concentration/indecisiveness: no Suicidal ideations: no Hopelessness: no Crying spells:  no    11/01/2022    8:45 AM 06/02/2022    2:13 PM 04/19/2022    1:31 PM 10/19/2021   10:33 AM 07/06/2021    9:33 AM  Depression screen PHQ 2/9  Decreased Interest 0 0 0 0 0  Down, Depressed, Hopeless 0 0 0 0 1  PHQ - 2 Score 0 0 0 0 1  Altered sleeping 0 0 0 2 1  Tired, decreased energy 0 0 0 0 0  Change in appetite 0 0 0 0 0  Feeling bad or failure about yourself  0 0 0 0 0  Trouble concentrating 0 0 0 0 1  Moving slowly or fidgety/restless 0 0 0 0 0  Suicidal thoughts 0 0 0 0 0  PHQ-9 Score 0 0 0 2 3  Difficult doing work/chores Not difficult at all Not difficult at all Not difficult at all Not difficult at all Somewhat difficult   She currently lives with: husband and kids Menopausal Symptoms: no  Depression Screen done today and results listed below:     11/01/2022    8:45 AM 06/02/2022    2:13 PM 04/19/2022    1:31 PM 10/19/2021   10:33 AM 07/06/2021    9:33 AM  Depression screen PHQ 2/9  Decreased Interest 0 0 0 0 0  Down, Depressed, Hopeless 0 0 0 0 1  PHQ - 2 Score 0 0 0 0 1  Altered sleeping 0 0 0 2 1  Tired, decreased energy  0 0 0 0 0  Change in appetite 0 0 0 0 0  Feeling bad or failure about yourself  0 0 0 0 0  Trouble concentrating 0 0 0 0 1  Moving slowly or fidgety/restless 0 0 0 0 0  Suicidal thoughts 0 0 0 0 0  PHQ-9 Score 0 0 0 2 3  Difficult doing work/chores Not difficult at all Not difficult at all Not difficult at all Not difficult at all Somewhat difficult    Past Medical History:  Past Medical History:  Diagnosis Date   Allergy    Aneurysm ([place])    Anxiety    Back pain    Constipation    Depression    Dermatofibroma    Diabetes mellitus without complication ([place])    Takes Metformin  dx 2016   Facial pain    Facial pain    GERD (gastroesophageal reflux disease)    Headache    Hyperlipidemia    IFG (impaired fasting glucose)    Low HDL (under 40)    Migraine    Obesity    Overweight    Patella-femoral syndrome    Pinguecula     Prediabetes    Thyroid nodule april 2016   Vitamin B12 deficiency    Vitamin D deficiency disease     Surgical History:  Past Surgical History:  Procedure Laterality Date   CARPAL TUNNEL RELEASE     GANGLION CYST EXCISION Left    IR 3D INDEPENDENT WKST  12/04/2017   IR ANGIO INTRA EXTRACRAN SEL INTERNAL CAROTID BILAT MOD SED  12/04/2017   IR ANGIO INTRA EXTRACRAN SEL INTERNAL CAROTID BILAT MOD SED  05/17/2021   IR ANGIO INTRA EXTRACRAN SEL INTERNAL CAROTID UNI L MOD SED  03/05/2018   IR ANGIO INTRA EXTRACRAN SEL INTERNAL CAROTID UNI L MOD SED  05/02/2019   IR ANGIO VERTEBRAL SEL VERTEBRAL BILAT MOD SED  12/04/2017   IR ANGIO VERTEBRAL SEL VERTEBRAL UNI R MOD SED  05/17/2021   IR ANGIOGRAM FOLLOW UP STUDY  03/05/2018   IR ANGIOGRAM FOLLOW UP STUDY  03/05/2018   IR ANGIOGRAM FOLLOW UP STUDY  03/05/2018   IR NEURO EACH ADD'L AFTER BASIC UNI LEFT (MS)  03/05/2018   IR TRANSCATH/EMBOLIZ  03/05/2018   IR US GUIDE VASC ACCESS RIGHT  05/17/2021   NASAL SINUS SURGERY  Nov 2015   RADIOLOGY WITH ANESTHESIA N/A 03/05/2018   Procedure: Arteriogram, coil embolization, possible stenting;  Surgeon: Consuella Lose, MD;  Location: [facility];  Service: Radiology;  Laterality: N/A;   TUBAL LIGATION      Medications:  Current Outpatient Medications on File Prior to Visit  Medication Sig   Azelaic Acid (FINACEA) 15 % FOAM Apply to face once to twice daily as directed.   Cholecalciferol (VITAMIN D) 125 MCG (5000 UT) CAPS    fluticasone (FLONASE) 50 MCG/ACT nasal spray Place 2 sprays into both nostrils daily.   glucose blood (FREESTYLE LITE) test strip Use to check blood sugar twice daily   IBUPROFEN PO Take 800 mg by mouth 3 (three) times daily as needed. Advil   loratadine (CLARITIN) 10 MG tablet Take 10 mg by mouth daily as needed for allergies.    Rimegepant Sulfate (NURTEC) 75 MG TBDP Take 1 tablet (75 mg total) by mouth as directed.   Tavaborole (KERYDIN) 5 % SOLN Apply to affected toenail nightly    triamcinolone cream (KENALOG) 0.1 % Apply to affected areas hands 1-2 times a day until improved.  No current facility-administered medications on file prior to visit.    Allergies:  Allergies  Allergen Reactions   Imitrex [Sumatriptan] Other (See Comments)    Chest pain    Wellbutrin [Bupropion] Other (See Comments)    Increased anxiety and tremors    Social History:  Social History   Socioeconomic History   Marital status: Married    Spouse name: Not on file   Number of children: Not on file   Years of education: Not on file   Highest education level: Not on file  Occupational History   Not on file  Tobacco Use   Smoking status: Never   Smokeless tobacco: Never  Vaping Use   Vaping Use: Never used  Substance and Sexual Activity   Alcohol use: Not Currently    Alcohol/week: 0.0 - 1.0 standard drinks of alcohol    Comment: Drinks 3-4 times per year   Drug use: No   Sexual activity: Yes    Birth control/protection: None  Other Topics Concern   Not on file  Social History Narrative   Not on file   Social Determinants of Health   Financial Resource Strain: Not on file  Food Insecurity: Not on file  Transportation Needs: Not on file  Physical Activity: Inactive (02/07/2018)   Exercise Vital Sign    Days of Exercise per Week: 0 days    Minutes of Exercise per Session: 0 min  Stress: No Stress Concern Present (02/07/2018)   [place]    Feeling of Stress : Only a little  Social Connections: Moderately Integrated (02/07/2018)   Social Connection and Isolation Panel [NHANES]    Frequency of Communication with Friends and Family: More than three times a week    Frequency of Social Gatherings with Friends and Family: More than three times a week    Attends Religious Services: More than 4 times per year    Active Member of [employer] or Organizations: No    Attends Archivist Meetings: Never     Marital Status: Married  Human resources officer Violence: Not At Risk (02/07/2018)   Humiliation, Afraid, Rape, and Kick questionnaire    Fear of Current or Ex-Partner: No    Emotionally Abused: No    Physically Abused: No    Sexually Abused: No   Social History   Tobacco Use  Smoking Status Never  Smokeless Tobacco Never   Social History   Substance and Sexual Activity  Alcohol Use Not Currently   Alcohol/week: 0.0 - 1.0 standard drinks of alcohol   Comment: Drinks 3-4 times per year    Family History:  Family History  Problem Relation Age of Onset   Depression Mother    Anxiety disorder Mother    Heart attack Father 21   Heart failure Father    Diabetes Father    Hypertension Father    Heart disease Father    Kidney disease Father    Stroke Father    Aneurysm Father    Hyperlipidemia Father    Diabetes Paternal Grandfather    Sudden death Neg Hx     Past medical history, surgical history, medications, allergies, family history and social history reviewed with patient today and changes made to appropriate areas of the chart.   Review of Systems  Constitutional: Negative.   HENT: Negative.    Eyes: Negative.   Respiratory: Negative.    Cardiovascular: Negative.   Gastrointestinal: Negative.   Genitourinary: Negative.  Musculoskeletal: Negative.   Skin: Negative.   Neurological: Negative.   Endo/Heme/Allergies: Negative.   Psychiatric/Behavioral: Negative.     All other ROS negative except what is listed above and in the HPI.      Objective:    BP 103/67   Pulse 69   Temp 97.8 F (36.6 C) (Oral)   Ht 5' (1.524 m)   Wt 145 lb (65.8 kg)   LMP 09/28/2022   SpO2 99%   BMI 28.32 kg/m   Wt Readings from Last 3 Encounters:  11/01/22 145 lb (65.8 kg)  06/02/22 150 lb (68 kg)  05/12/22 151 lb (68.5 kg)    Physical Exam Vitals and nursing note reviewed.  Constitutional:      General: She is not in acute distress.    Appearance: Normal appearance. She is  not ill-appearing, toxic-appearing or diaphoretic.  HENT:     Head: Normocephalic and atraumatic.     Right Ear: Tympanic membrane, ear canal and external ear normal. There is no impacted cerumen.     Left Ear: Tympanic membrane, ear canal and external ear normal. There is no impacted cerumen.     Nose: Nose normal. No congestion or rhinorrhea.     Mouth/Throat:     Mouth: Mucous membranes are moist.     Pharynx: Oropharynx is clear. No oropharyngeal exudate or posterior oropharyngeal erythema.  Eyes:     General: No scleral icterus.       Right eye: No discharge.        Left eye: No discharge.     Extraocular Movements: Extraocular movements intact.     Conjunctiva/sclera: Conjunctivae normal.     Pupils: Pupils are equal, round, and reactive to light.  Neck:     Vascular: No carotid bruit.  Cardiovascular:     Rate and Rhythm: Normal rate and regular rhythm.     Pulses: Normal pulses.     Heart sounds: No murmur heard.    No friction rub. No gallop.  Pulmonary:     Effort: Pulmonary effort is normal. No respiratory distress.     Breath sounds: Normal breath sounds. No stridor. No wheezing, rhonchi or rales.  Chest:     Chest wall: No tenderness.  Abdominal:     General: Abdomen is flat. Bowel sounds are normal. There is no distension.     Palpations: Abdomen is soft. There is no mass.     Tenderness: There is no abdominal tenderness. There is no right CVA tenderness, left CVA tenderness, guarding or rebound.     Hernia: No hernia is present.  Genitourinary:    Comments: Breast and pelvic exams deferred with shared decision making Musculoskeletal:        General: No swelling, tenderness, deformity or signs of injury.     Cervical back: Normal range of motion and neck supple. No rigidity. No muscular tenderness.     Right lower leg: No edema.     Left lower leg: No edema.  Lymphadenopathy:     Cervical: No cervical adenopathy.  Skin:    General: Skin is warm and dry.      Capillary Refill: Capillary refill takes less than 2 seconds.     Coloration: Skin is not jaundiced or pale.     Findings: No bruising, erythema, lesion or rash.  Neurological:     General: No focal deficit present.     Mental Status: She is alert and oriented to person, place, and time. Mental status is  at baseline.     Cranial Nerves: No cranial nerve deficit.     Sensory: No sensory deficit.     Motor: No weakness.     Coordination: Coordination normal.     Gait: Gait normal.     Deep Tendon Reflexes: Reflexes normal.  Psychiatric:        Mood and Affect: Mood normal.        Behavior: Behavior normal.        Thought Content: Thought content normal.        Judgment: Judgment normal.     Results for orders placed or performed in visit on 07/20/22  HM DIABETES EYE EXAM  Result Value Ref Range   HM Diabetic Eye Exam No Retinopathy No Retinopathy      Assessment & Plan:   Problem List Items Addressed This Visit       Endocrine   Type 2 diabetes mellitus with hyperglycemia, without long-term current use of insulin ([place])    Doing great with A1c of 5.0. Continue current regimen. Continue to monitor. Call with any concerns.       Relevant Medications   metFORMIN (GLUCOPHAGE-XR) 750 MG 24 hr tablet   Semaglutide, 2 MG/DOSE, 8 MG/3ML SOPN   Other Relevant Orders   Bayer DCA Hb A1c Waived   Microalbumin, Urine Waived     Other   Hyperlipidemia    Rechecking labs today. Await results. Treat as needed.       Relevant Medications   furosemide (LASIX) 20 MG tablet   Vitamin D deficiency disease    Rechecking labs today. Await results. Treat as needed.       Depression    Under good control on current regimen. Continue current regimen. Continue to monitor. Call with any concerns. Refills given.        Relevant Medications   busPIRone (BUSPAR) 10 MG tablet   Other Visit Diagnoses     Routine general medical examination at a health care facility    -  Primary    Vaccines up to date. Screening labs checked today. Pap defered due to menses. Mammo up to date. Cologuard ordered. Continue diet and exercise. Call w/ concerns   Relevant Orders   CBC with Differential/Platelet   Comprehensive metabolic panel   Lipid Panel w/o Chol/HDL Ratio   Urinalysis, Routine w reflex microscopic   TSH   Screening for colon cancer       Cologuard ordered today.   Relevant Orders   Cologuard        Follow up plan: Return in about 6 months (around 05/03/2023) for follow up and pap.   LABORATORY TESTING:  - Pap smear: up to date  IMMUNIZATIONS:   - Tdap: Tetanus vaccination status reviewed: last tetanus booster within 10 years. - Influenza: Up to date - Pneumovax: Up to date - Prevnar: Not applicable - COVID: Up to date - HPV: Not applicable - Shingrix vaccine: Not applicable  SCREENING: -Mammogram: Up to date  - Colonoscopy: Cologuard ordered today   PATIENT COUNSELING:   Advised to take 1 mg of folate supplement per day if capable of pregnancy.   Sexuality: Discussed sexually transmitted diseases, partner selection, use of condoms, avoidance of unintended pregnancy  and contraceptive alternatives.   Advised to avoid cigarette smoking.  I discussed with the patient that most people either abstain from alcohol or drink within safe limits (<=14/week and <=4 drinks/occasion for males, <=7/weeks and <= 3 drinks/occasion for females) and that the  risk for alcohol disorders and other health effects rises proportionally with the number of drinks per week and how often a drinker exceeds daily limits.  Discussed cessation/primary prevention of drug use and availability of treatment for abuse.   Diet: Encouraged to adjust caloric intake to maintain  or achieve ideal body weight, to reduce intake of dietary saturated fat and total fat, to limit sodium intake by avoiding high sodium foods and not adding table salt, and to maintain adequate dietary potassium and  calcium preferably from fresh fruits, vegetables, and low-fat dairy products.    stressed the importance of regular exercise  Injury prevention: Discussed safety belts, safety helmets, smoke detector, smoking near bedding or upholstery.   Dental health: Discussed importance of regular tooth brushing, flossing, and dental visits.    NEXT PREVENTATIVE PHYSICAL DUE IN 1 YEAR. Return in about 6 months (around 05/03/2023) for follow up and pap.

## 2022-11-01 NOTE — Assessment & Plan Note (Signed)
Under good control on current regimen. Continue current regimen. Continue to monitor. Call with any concerns. Refills given.   

## 2022-11-01 NOTE — Assessment & Plan Note (Signed)
Rechecking labs today. Await results. Treat as needed.  °

## 2022-11-02 LAB — CBC WITH DIFFERENTIAL/PLATELET
Basophils Absolute: 0 10*3/uL (ref 0.0–0.2)
Basos: 1 %
EOS (ABSOLUTE): 0.2 10*3/uL (ref 0.0–0.4)
Eos: 3 %
Hematocrit: 38.6 % (ref 34.0–46.6)
Hemoglobin: 12.8 g/dL (ref 11.1–15.9)
Immature Grans (Abs): 0 10*3/uL (ref 0.0–0.1)
Immature Granulocytes: 0 %
Lymphocytes Absolute: 1.7 10*3/uL (ref 0.7–3.1)
Lymphs: 29 %
MCH: 30.2 pg (ref 26.6–33.0)
MCHC: 33.2 g/dL (ref 31.5–35.7)
MCV: 91 fL (ref 79–97)
Monocytes Absolute: 0.4 10*3/uL (ref 0.1–0.9)
Monocytes: 7 %
Neutrophils Absolute: 3.7 10*3/uL (ref 1.4–7.0)
Neutrophils: 60 %
Platelets: 189 10*3/uL (ref 150–450)
RBC: 4.24 x10E6/uL (ref 3.77–5.28)
RDW: 13.2 % (ref 11.7–15.4)
WBC: 6 10*3/uL (ref 3.4–10.8)

## 2022-11-02 LAB — COMPREHENSIVE METABOLIC PANEL
ALT: 13 IU/L (ref 0–32)
AST: 15 IU/L (ref 0–40)
Albumin/Globulin Ratio: 2.2 (ref 1.2–2.2)
Albumin: 4.4 g/dL (ref 3.9–4.9)
Alkaline Phosphatase: 77 IU/L (ref 44–121)
BUN/Creatinine Ratio: 15 (ref 9–23)
BUN: 10 mg/dL (ref 6–24)
Bilirubin Total: 0.9 mg/dL (ref 0.0–1.2)
CO2: 20 mmol/L (ref 20–29)
Calcium: 9.5 mg/dL (ref 8.7–10.2)
Chloride: 104 mmol/L (ref 96–106)
Creatinine, Ser: 0.67 mg/dL (ref 0.57–1.00)
Globulin, Total: 2 g/dL (ref 1.5–4.5)
Glucose: 84 mg/dL (ref 70–99)
Potassium: 4.3 mmol/L (ref 3.5–5.2)
Sodium: 142 mmol/L (ref 134–144)
Total Protein: 6.4 g/dL (ref 6.0–8.5)
eGFR: 110 mL/min/{1.73_m2} (ref >59)

## 2022-11-02 LAB — TSH: TSH: 2.24 u[IU]/mL (ref 0.450–4.500)

## 2022-11-02 LAB — LIPID PANEL W/O CHOL/HDL RATIO
Cholesterol, Total: 157 mg/dL (ref 100–199)
HDL: 34 mg/dL — ABNORMAL LOW (ref >39)
LDL Chol Calc (NIH): 99 mg/dL (ref 0–99)
Triglycerides: 136 mg/dL (ref 0–149)
VLDL Cholesterol Cal: 24 mg/dL (ref 5–40)

## 2022-11-10 ENCOUNTER — Other Ambulatory Visit: Payer: Self-pay

## 2022-11-14 ENCOUNTER — Other Ambulatory Visit: Payer: Self-pay

## 2022-11-29 ENCOUNTER — Other Ambulatory Visit (HOSPITAL_COMMUNITY): Payer: Self-pay

## 2022-12-15 ENCOUNTER — Other Ambulatory Visit: Payer: Self-pay

## 2022-12-29 DIAGNOSIS — Z1211 Encounter for screening for malignant neoplasm of colon: Secondary | ICD-10-CM | POA: Diagnosis not present

## 2023-01-02 ENCOUNTER — Ambulatory Visit (INDEPENDENT_AMBULATORY_CARE_PROVIDER_SITE_OTHER): Payer: 59 | Admitting: Nurse Practitioner

## 2023-01-02 ENCOUNTER — Other Ambulatory Visit: Payer: Self-pay

## 2023-01-02 ENCOUNTER — Encounter: Payer: Self-pay | Admitting: Nurse Practitioner

## 2023-01-02 VITALS — BP 136/81 | HR 89 | Temp 97.8°F | Wt 147.0 lb

## 2023-01-02 DIAGNOSIS — R300 Dysuria: Secondary | ICD-10-CM

## 2023-01-02 DIAGNOSIS — N3000 Acute cystitis without hematuria: Secondary | ICD-10-CM | POA: Diagnosis not present

## 2023-01-02 MED ORDER — NITROFURANTOIN MONOHYD MACRO 100 MG PO CAPS
100.0000 mg | ORAL_CAPSULE | Freq: Two times a day (BID) | ORAL | 0 refills | Status: DC
  Filled 2023-01-02: qty 10, 5d supply, fill #0

## 2023-01-02 NOTE — Progress Notes (Signed)
BP 136/81   Pulse 89   Temp 97.8 F (36.6 C) (Oral)   Wt 147 lb (66.7 kg)   BMI 28.71 kg/m    Subjective:    Patient ID: Ashley Wiley, female    DOB: 07-Sep-1976, 47 y.o.   MRN: [id number]  HPI: Ashley Wiley is a 47 y.o. female  Chief Complaint  Patient presents with   Urinary Tract Infection   URINARY SYMPTOMS Dysuria: yes Urinary frequency: no Urgency: yes Small volume voids: no Symptom severity: no Urinary incontinence: no Foul odor: no Hematuria: no Abdominal pain: yes Back pain: no Suprapubic pain/pressure: yes Flank pain: no Fever:  no Vomiting: no Relief with cranberry juice: no Relief with pyridium: no Status: stable Previous urinary tract infection: no Recurrent urinary tract infection: no History of sexually transmitted disease: no Penile discharge: no Treatments attempted: increasing fluids    Relevant past medical, surgical, family and social history reviewed and updated as indicated. Interim medical history since our last visit reviewed. Allergies and medications reviewed and updated.  Review of Systems  Constitutional:  Negative for fever.  Gastrointestinal:  Positive for abdominal pain. Negative for vomiting.  Genitourinary:  Positive for dysuria and urgency. Negative for decreased urine volume, flank pain, frequency and hematuria.  Musculoskeletal:  Negative for back pain.    Per HPI unless specifically indicated above     Objective:    BP 136/81   Pulse 89   Temp 97.8 F (36.6 C) (Oral)   Wt 147 lb (66.7 kg)   BMI 28.71 kg/m   Wt Readings from Last 3 Encounters:  01/02/23 147 lb (66.7 kg)  11/01/22 145 lb (65.8 kg)  06/02/22 150 lb (68 kg)    Physical Exam Vitals and nursing note reviewed.  Constitutional:      General: She is not in acute distress.    Appearance: Normal appearance. She is normal weight. She is not ill-appearing, toxic-appearing or diaphoretic.  HENT:     Head: Normocephalic.     Right Ear:  External ear normal.     Left Ear: External ear normal.     Nose: Nose normal.     Mouth/Throat:     Mouth: Mucous membranes are moist.     Pharynx: Oropharynx is clear.  Eyes:     General:        Right eye: No discharge.        Left eye: No discharge.     Extraocular Movements: Extraocular movements intact.     Conjunctiva/sclera: Conjunctivae normal.     Pupils: Pupils are equal, round, and reactive to light.  Cardiovascular:     Rate and Rhythm: Normal rate and regular rhythm.     Heart sounds: No murmur heard. Pulmonary:     Effort: Pulmonary effort is normal. No respiratory distress.     Breath sounds: Normal breath sounds. No wheezing or rales.  Musculoskeletal:     Cervical back: Normal range of motion and neck supple.  Skin:    General: Skin is warm and dry.     Capillary Refill: Capillary refill takes less than 2 seconds.  Neurological:     General: No focal deficit present.     Mental Status: She is alert and oriented to person, place, and time. Mental status is at baseline.  Psychiatric:        Mood and Affect: Mood normal.        Behavior: Behavior normal.  Thought Content: Thought content normal.        Judgment: Judgment normal.     Results for orders placed or performed in visit on 11/01/22  CBC with Differential/Platelet  Result Value Ref Range   WBC 6.0 3.4 - 10.8 x10E3/uL   RBC 4.24 3.77 - 5.28 x10E6/uL   Hemoglobin 12.8 11.1 - 15.9 g/dL   Hematocrit 38.6 34.0 - 46.6 %   MCV 91 79 - 97 fL   MCH 30.2 26.6 - 33.0 pg   MCHC 33.2 31.5 - 35.7 g/dL   RDW 13.2 11.7 - 15.4 %   Platelets 189 150 - 450 x10E3/uL   Neutrophils 60 Not Estab. %   Lymphs 29 Not Estab. %   Monocytes 7 Not Estab. %   Eos 3 Not Estab. %   Basos 1 Not Estab. %   Neutrophils Absolute 3.7 1.4 - 7.0 x10E3/uL   Lymphocytes Absolute 1.7 0.7 - 3.1 x10E3/uL   Monocytes Absolute 0.4 0.1 - 0.9 x10E3/uL   EOS (ABSOLUTE) 0.2 0.0 - 0.4 x10E3/uL   Basophils Absolute 0.0 0.0 - 0.2  x10E3/uL   Immature Granulocytes 0 Not Estab. %   Immature Grans (Abs) 0.0 0.0 - 0.1 x10E3/uL  Comprehensive metabolic panel  Result Value Ref Range   Glucose 84 70 - 99 mg/dL   BUN 10 6 - 24 mg/dL   Creatinine, Ser 0.67 0.57 - 1.00 mg/dL   eGFR 110 >59 mL/min/1.73   BUN/Creatinine Ratio 15 9 - 23   Sodium 142 134 - 144 mmol/L   Potassium 4.3 3.5 - 5.2 mmol/L   Chloride 104 96 - 106 mmol/L   CO2 20 20 - 29 mmol/L   Calcium 9.5 8.7 - 10.2 mg/dL   Total Protein 6.4 6.0 - 8.5 g/dL   Albumin 4.4 3.9 - 4.9 g/dL   Globulin, Total 2.0 1.5 - 4.5 g/dL   Albumin/Globulin Ratio 2.2 1.2 - 2.2   Bilirubin Total 0.9 0.0 - 1.2 mg/dL   Alkaline Phosphatase 77 44 - 121 IU/L   AST 15 0 - 40 IU/L   ALT 13 0 - 32 IU/L  Lipid Panel w/o Chol/HDL Ratio  Result Value Ref Range   Cholesterol, Total 157 100 - 199 mg/dL   Triglycerides 136 0 - 149 mg/dL   HDL 34 (L) >39 mg/dL   VLDL Cholesterol Cal 24 5 - 40 mg/dL   LDL Chol Calc (NIH) 99 0 - 99 mg/dL  TSH  Result Value Ref Range   TSH 2.240 0.450 - 4.500 uIU/mL  Bayer DCA Hb A1c Waived  Result Value Ref Range   HB A1C (BAYER DCA - WAIVED) 5.0 4.8 - 5.6 %      Assessment & Plan:   Problem List Items Addressed This Visit   None Visit Diagnoses     Acute cystitis without hematuria    -  Primary   Will treat with macrobid.  Will send out urine culture and swab.  Will treat based on results.   Dysuria       Relevant Orders   Urine Culture   NuSwab Vaginitis (VG)([facility])        Follow up plan: Return if symptoms worsen or fail to improve.

## 2023-01-05 LAB — NUSWAB VAGINITIS (VG)
Candida albicans, NAA: NEGATIVE
Candida glabrata, NAA: NEGATIVE
Trich vag by NAA: NEGATIVE

## 2023-01-05 NOTE — Progress Notes (Signed)
Hi [name].  No evidence of BV or yeast on your swab.  Let me know if your symptoms do not improve.

## 2023-01-06 LAB — URINE CULTURE

## 2023-01-08 NOTE — Progress Notes (Signed)
Hi Ashley Wiley.  Your urine did end up showing that you have a UTI.  The macrobid should take care of the infection.  If you are still having symptoms, please let me know.

## 2023-01-11 LAB — COLOGUARD: COLOGUARD: NEGATIVE

## 2023-01-17 DIAGNOSIS — G932 Benign intracranial hypertension: Secondary | ICD-10-CM | POA: Diagnosis not present

## 2023-01-24 DIAGNOSIS — G932 Benign intracranial hypertension: Secondary | ICD-10-CM | POA: Diagnosis not present

## 2023-01-24 DIAGNOSIS — M3501 Sicca syndrome with keratoconjunctivitis: Secondary | ICD-10-CM | POA: Diagnosis not present

## 2023-01-24 LAB — HM DIABETES EYE EXAM

## 2023-01-26 ENCOUNTER — Encounter: Payer: Self-pay | Admitting: Physician Assistant

## 2023-01-26 ENCOUNTER — Ambulatory Visit (INDEPENDENT_AMBULATORY_CARE_PROVIDER_SITE_OTHER): Payer: 59 | Admitting: Physician Assistant

## 2023-01-26 VITALS — BP 128/81 | HR 86 | Temp 97.6°F | Resp 16 | Wt 147.0 lb

## 2023-01-26 DIAGNOSIS — R300 Dysuria: Secondary | ICD-10-CM | POA: Diagnosis not present

## 2023-01-26 DIAGNOSIS — N3000 Acute cystitis without hematuria: Secondary | ICD-10-CM | POA: Diagnosis not present

## 2023-01-26 MED ORDER — NITROFURANTOIN MONOHYD MACRO 100 MG PO CAPS: 100.0000 mg | ORAL_CAPSULE | Freq: Two times a day (BID) | ORAL | 0 refills | Status: DC

## 2023-01-26 MED ORDER — PHENAZOPYRIDINE HCL 100 MG PO TABS: 100.0000 mg | ORAL_TABLET | Freq: Three times a day (TID) | ORAL | 0 refills | Status: DC | PRN

## 2023-01-26 NOTE — Progress Notes (Unsigned)
Acute Office Visit   Patient: Ashley Wiley   DOB: 1976/03/16   47 y.o. Female  MRN: [id number] Visit Date: 01/26/2023  Today's healthcare provider: Dani Gobble Marveline Profeta, PA-C  Introduced myself to the patient as a Journalist, newspaper and provided education on APPs in clinical practice.    Chief Complaint  Patient presents with   Urinary Tract Infection   Subjective    HPI    Concern for UTI  Onset: gradual  Duration: ongoing since Monday   Associated symptoms:reports lower abdominal pain and pressure, flank pain along right side, some mild pain with urination for the past 2 days and increased urinary frequency   Interventions: Tylenol  She reports her sugars have been well controlled - this AM her glucose was 108   She reports she has been taking Furosemide and does not have the ability to use the bathroom much during the day She reports she is having to hold her urine for prolonged periods throughout the day and she has noticed increased UTI incidents since she started this.    Medications: Outpatient Medications Prior to Visit  Medication Sig   Azelaic Acid (FINACEA) 15 % FOAM Apply to face once to twice daily as directed.   Cholecalciferol (VITAMIN D) 125 MCG (5000 UT) CAPS    fluticasone (FLONASE) 50 MCG/ACT nasal spray Place 2 sprays into both nostrils daily.   furosemide (LASIX) 20 MG tablet Take 1 tablet (20 mg total) by mouth once daily   glucose blood (FREESTYLE LITE) test strip Use to check blood sugar twice daily   loratadine (CLARITIN) 10 MG tablet Take 10 mg by mouth daily as needed for allergies.    metFORMIN (GLUCOPHAGE-XR) 750 MG 24 hr tablet Take 1 tablet (750 mg total) by mouth daily with breakfast.   Rimegepant Sulfate (NURTEC) 75 MG TBDP Take 1 tablet (75 mg total) by mouth as directed.   Semaglutide, 2 MG/DOSE, 8 MG/3ML SOPN Inject 2 mg as directed once a week.   Tavaborole (KERYDIN) 5 % SOLN Apply to affected toenail nightly   triamcinolone cream (KENALOG)  0.1 % Apply to affected areas hands 1-2 times a day until improved.   busPIRone (BUSPAR) 10 MG tablet Take 1-1.5 tablets (10-15 mg total) by mouth 3 (three) times daily as needed.   IBUPROFEN PO Take 800 mg by mouth 3 (three) times daily as needed. Advil   naproxen (NAPROSYN) 500 MG tablet Take 1 tablet (500 mg total) by mouth 2 (two) times daily with a meal.   [DISCONTINUED] nitrofurantoin, macrocrystal-monohydrate, (MACROBID) 100 MG capsule Take 1 capsule (100 mg total) by mouth 2 (two) times daily.   No facility-administered medications prior to visit.    Review of Systems  Gastrointestinal:  Positive for abdominal pain.  Genitourinary:  Positive for dysuria, flank pain, frequency and urgency. Negative for decreased urine volume, difficulty urinating, enuresis, vaginal bleeding, vaginal discharge and vaginal pain.    {Labs  Heme  Chem  Endocrine  Serology  Results Review (optional):23779}   Objective    BP 128/81   Pulse 86   Temp 97.6 F (36.4 C) (Oral)   Resp 16   Wt 147 lb (66.7 kg)   SpO2 98%   BMI 28.71 kg/m  {Show previous vital signs (optional):23777}  Physical Exam Vitals reviewed.  Constitutional:      General: She is awake.     Appearance: Normal appearance. She is well-developed and well-groomed.  HENT:  Head: Normocephalic and atraumatic.  Pulmonary:     Effort: Pulmonary effort is normal.  Abdominal:     General: Abdomen is flat. Bowel sounds are normal.     Palpations: Abdomen is soft.     Tenderness: There is abdominal tenderness in the right lower quadrant, suprapubic area and left lower quadrant. There is right CVA tenderness.  Musculoskeletal:     Cervical back: Normal range of motion.  Skin:    General: Skin is warm.  Neurological:     General: No focal deficit present.     Mental Status: She is alert and oriented to person, place, and time. Mental status is at baseline.  Psychiatric:        Mood and Affect: Mood normal.        Behavior:  Behavior normal. Behavior is cooperative.        Thought Content: Thought content normal.        Judgment: Judgment normal.       UA results   Media Information   Document Information  Photos    01/26/2023 15:19  Attached To:  Office Visit on 01/26/23 with Trenise Turay, Dani Gobble, PA-C  Source Information  Marcelo Ickes, Dani Gobble, PA-C  [facility]    No results found for any visits on 01/26/23.  Assessment & Plan      No follow-ups on file.      Problem List Items Addressed This Visit   None Visit Diagnoses     Acute cystitis without hematuria    -  Primary   Relevant Medications   phenazopyridine (PYRIDIUM) 100 MG tablet   nitrofurantoin, macrocrystal-monohydrate, (MACROBID) 100 MG capsule   Dysuria       Relevant Medications   phenazopyridine (PYRIDIUM) 100 MG tablet   nitrofurantoin, macrocrystal-monohydrate, (MACROBID) 100 MG capsule   Other Relevant Orders   Urine Culture        No follow-ups on file.   I, Jaystin Mcgarvey E Evelene Roussin, PA-C, have reviewed all documentation for this visit. The documentation on 01/26/23 for the exam, diagnosis, procedures, and orders are all accurate and complete.   Talitha Givens, MHS, PA-C [facility]

## 2023-01-29 LAB — URINE CULTURE

## 2023-01-29 NOTE — Progress Notes (Signed)
Urine culture did not show bacterial growth. You can stop the Macrobid at this time.

## 2023-02-26 ENCOUNTER — Other Ambulatory Visit: Payer: Self-pay

## 2023-02-27 ENCOUNTER — Other Ambulatory Visit: Payer: Self-pay

## 2023-03-12 ENCOUNTER — Other Ambulatory Visit: Payer: Self-pay

## 2023-03-14 DIAGNOSIS — Z9889 Other specified postprocedural states: Secondary | ICD-10-CM | POA: Diagnosis not present

## 2023-03-14 DIAGNOSIS — Z8679 Personal history of other diseases of the circulatory system: Secondary | ICD-10-CM | POA: Diagnosis not present

## 2023-03-14 DIAGNOSIS — E538 Deficiency of other specified B group vitamins: Secondary | ICD-10-CM | POA: Diagnosis not present

## 2023-03-14 DIAGNOSIS — G932 Benign intracranial hypertension: Secondary | ICD-10-CM | POA: Diagnosis not present

## 2023-03-14 DIAGNOSIS — G43719 Chronic migraine without aura, intractable, without status migrainosus: Secondary | ICD-10-CM | POA: Diagnosis not present

## 2023-03-14 DIAGNOSIS — E569 Vitamin deficiency, unspecified: Secondary | ICD-10-CM | POA: Diagnosis not present

## 2023-03-14 DIAGNOSIS — E236 Other disorders of pituitary gland: Secondary | ICD-10-CM | POA: Diagnosis not present

## 2023-03-15 ENCOUNTER — Other Ambulatory Visit: Payer: Self-pay

## 2023-03-15 MED ORDER — ETODOLAC 200 MG PO CAPS
200.0000 mg | ORAL_CAPSULE | Freq: Two times a day (BID) | ORAL | 0 refills | Status: DC | PRN
  Filled 2023-03-15: qty 45, 23d supply, fill #0

## 2023-03-20 ENCOUNTER — Other Ambulatory Visit: Payer: Self-pay | Admitting: Student

## 2023-03-20 DIAGNOSIS — G43E19 Chronic migraine with aura, intractable, without status migrainosus: Secondary | ICD-10-CM

## 2023-03-21 ENCOUNTER — Other Ambulatory Visit: Payer: Self-pay

## 2023-03-21 ENCOUNTER — Ambulatory Visit
Admission: RE | Admit: 2023-03-21 | Discharge: 2023-03-21 | Disposition: A | Discharge status: Home / Self Care | Payer: 59 | Source: Ambulatory Visit | Attending: Student | Admitting: Student

## 2023-03-21 DIAGNOSIS — R519 Headache, unspecified: Secondary | ICD-10-CM | POA: Diagnosis not present

## 2023-03-21 DIAGNOSIS — G43E19 Chronic migraine with aura, intractable, without status migrainosus: Secondary | ICD-10-CM | POA: Diagnosis not present

## 2023-03-21 MED ORDER — ERGOCALCIFEROL 1.25 MG (50000 UT) PO CAPS
1.0000 | ORAL_CAPSULE | ORAL | 0 refills | Status: DC
  Filled 2023-03-21: qty 8, 56d supply, fill #0

## 2023-03-21 MED ORDER — GADOBUTROL 1 MMOL/ML IV SOLN
6.0000 mL | Freq: Once | INTRAVENOUS | Status: AC | PRN
  Administered 2023-03-21: 6 mL via INTRAVENOUS

## 2023-03-23 ENCOUNTER — Other Ambulatory Visit: Payer: Self-pay | Admitting: Family Medicine

## 2023-03-23 ENCOUNTER — Other Ambulatory Visit: Payer: Self-pay

## 2023-03-23 ENCOUNTER — Ambulatory Visit (INDEPENDENT_AMBULATORY_CARE_PROVIDER_SITE_OTHER): Payer: 59 | Admitting: Family Medicine

## 2023-03-23 ENCOUNTER — Ambulatory Visit: Payer: 59 | Admitting: Family Medicine

## 2023-03-23 VITALS — BP 128/84 | HR 71 | Temp 97.8°F | Resp 16 | Wt 153.0 lb

## 2023-03-23 DIAGNOSIS — Z800 Family history of malignant neoplasm of digestive organs: Secondary | ICD-10-CM

## 2023-03-23 DIAGNOSIS — M7632 Iliotibial band syndrome, left leg: Secondary | ICD-10-CM | POA: Diagnosis not present

## 2023-03-23 DIAGNOSIS — Z1211 Encounter for screening for malignant neoplasm of colon: Secondary | ICD-10-CM | POA: Diagnosis not present

## 2023-03-23 DIAGNOSIS — M79671 Pain in right foot: Secondary | ICD-10-CM

## 2023-03-23 DIAGNOSIS — M79672 Pain in left foot: Secondary | ICD-10-CM

## 2023-03-23 MED ORDER — BACLOFEN 10 MG PO TABS
10.0000 mg | ORAL_TABLET | Freq: Every evening | ORAL | 0 refills | Status: DC | PRN
  Filled 2023-03-23: qty 30, 30d supply, fill #0

## 2023-03-23 NOTE — Progress Notes (Signed)
BP 128/84   Pulse 71   Temp 97.8 F (36.6 C) (Oral)   Resp 16   Wt 153 lb (69.4 kg)   SpO2 97%   BMI 29.88 kg/m    Subjective:    Patient ID: Ashley Wiley, female    DOB: [birth year], 47 y.o.   MRN: [id number]  HPI: Ashley Wiley is a 47 y.o. female  Chief Complaint  Patient presents with   Leg Pain   Foot Pain   LEG PAIN Duration: weeks Mechanism of injury: unknown Location: Lateral side of L leg, back of her heels and L buttock Onset: sudden Severity: moderate Quality: aching and sore Frequency: intermittent Radiation: L leg above the knee Aggravating factors: sleeping Alleviating factors: moving, stretching Status: worse Treatments attempted: etodolac, stretching  Relief with NSAIDs?: mild Nighttime pain:  yes Paresthesias / decreased sensation:  no Bowel / bladder incontinence:  no Fevers:  no Dysuria / urinary frequency:  no  Brother recently diagnosed with colon cancer. Would like to get colonoscopy. Has been under a lot of stress helping with family.  Relevant past medical, surgical, family and social history reviewed and updated as indicated. Interim medical history since our last visit reviewed. Allergies and medications reviewed and updated.  Review of Systems  Constitutional: Negative.   Respiratory: Negative.    Cardiovascular: Negative.   Gastrointestinal: Negative.   Musculoskeletal:  Positive for myalgias. Negative for arthralgias, back pain, gait problem, joint swelling, neck pain and neck stiffness.  Skin: Negative.   Neurological: Negative.   Psychiatric/Behavioral: Negative.      Per HPI unless specifically indicated above     Objective:    BP 128/84   Pulse 71   Temp 97.8 F (36.6 C) (Oral)   Resp 16   Wt 153 lb (69.4 kg)   SpO2 97%   BMI 29.88 kg/m   Wt Readings from Last 3 Encounters:  03/23/23 153 lb (69.4 kg)  01/26/23 147 lb (66.7 kg)  01/02/23 147 lb (66.7 kg)    Physical Exam Vitals and nursing note  reviewed.  Constitutional:      General: She is not in acute distress.    Appearance: Normal appearance. She is not ill-appearing, toxic-appearing or diaphoretic.  HENT:     Head: Normocephalic and atraumatic.     Right Ear: External ear normal.     Left Ear: External ear normal.     Nose: Nose normal.     Mouth/Throat:     Mouth: Mucous membranes are moist.     Pharynx: Oropharynx is clear.  Eyes:     General: No scleral icterus.       Right eye: No discharge.        Left eye: No discharge.     Extraocular Movements: Extraocular movements intact.     Conjunctiva/sclera: Conjunctivae normal.     Pupils: Pupils are equal, round, and reactive to light.  Cardiovascular:     Rate and Rhythm: Normal rate and regular rhythm.     Pulses: Normal pulses.     Heart sounds: Normal heart sounds. No murmur heard.    No friction rub. No gallop.  Pulmonary:     Effort: Pulmonary effort is normal. No respiratory distress.     Breath sounds: Normal breath sounds. No stridor. No wheezing, rhonchi or rales.  Chest:     Chest wall: No tenderness.  Musculoskeletal:        General: Tenderness (L IT band and L piriformis)  present. No swelling, deformity or signs of injury. Normal range of motion.     Cervical back: Normal range of motion and neck supple.     Right lower leg: No edema.     Left lower leg: No edema.  Skin:    General: Skin is warm and dry.     Capillary Refill: Capillary refill takes less than 2 seconds.     Coloration: Skin is not jaundiced or pale.     Findings: No bruising, erythema, lesion or rash.  Neurological:     General: No focal deficit present.     Mental Status: She is alert and oriented to person, place, and time. Mental status is at baseline.  Psychiatric:        Mood and Affect: Mood normal.        Behavior: Behavior normal.        Thought Content: Thought content normal.        Judgment: Judgment normal.     Results for orders placed or performed in visit on  01/26/23  Urine Culture   Specimen: Urine   UR  Result Value Ref Range   Urine Culture, Routine Final report    Organism ID, Bacteria Comment       Assessment & Plan:   Problem List Items Addressed This Visit   None Visit Diagnoses     It band syndrome, left    -  Primary   Will treat with stretches and baclofen. Call if not getting better or getting worse.   Heel pain, bilateral       Will treat with baclofen and stretches. Call with any concerns or if not getting better.   Family history of colon cancer       Brother just diagnosed with sage 4 colon cancer at 42. She has done a cologuard, which was negative, but would be due next year for a colonoscopy.   Relevant Orders   Ambulatory referral to Gastroenterology   Screening for colon cancer       Referral to GI placed today.   Relevant Orders   Ambulatory referral to Gastroenterology        Follow up plan: Return if symptoms worsen or fail to improve.

## 2023-03-29 ENCOUNTER — Other Ambulatory Visit: Payer: Self-pay

## 2023-03-29 ENCOUNTER — Telehealth: Payer: Self-pay

## 2023-03-29 DIAGNOSIS — Z800 Family history of malignant neoplasm of digestive organs: Secondary | ICD-10-CM

## 2023-03-29 DIAGNOSIS — Z1211 Encounter for screening for malignant neoplasm of colon: Secondary | ICD-10-CM

## 2023-03-29 MED ORDER — NA SULFATE-K SULFATE-MG SULF 17.5-3.13-1.6 GM/177ML PO SOLN
1.0000 | Freq: Once | ORAL | 0 refills | Status: AC
  Filled 2023-03-29: qty 354, 1d supply, fill #0

## 2023-03-29 NOTE — Telephone Encounter (Signed)
Gastroenterology Pre-Procedure Review  Request Date: 04/11/23 Requesting Physician: Dr. Allegra Lai  PATIENT REVIEW QUESTIONS: The patient responded to the following health history questions as indicated:    1. Are you having any GI issues? no 2. Do you have a personal history of Polyps? no 3. Do you have a family history of Colon Cancer or Polyps? yes (brother colon cancer) 4. Diabetes Mellitus? yes (PATIENT has been advised to stop Semaglutide 7 days before procedure on 05/17.  Stop metformin 2 days prior to colonoscopy on 04/09/23) 5. Joint replacements in the past 12 months?no 6. Major health problems in the past 3 months?no 7. Any artificial heart valves, MVP, or defibrillator?no    MEDICATIONS & ALLERGIES:    Patient reports the following regarding taking any anticoagulation/antiplatelet therapy:   Plavix, Coumadin, Eliquis, Xarelto, Lovenox, Pradaxa, Brilinta, or Effient? no Aspirin? no  Patient confirms/reports the following medications:  Current Outpatient Medications  Medication Sig Dispense Refill   Azelaic Acid (FINACEA) 15 % FOAM Apply to face once to twice daily as directed. 50 g 5   baclofen (LIORESAL) 10 MG tablet Take 1 tablet (10 mg total) by mouth at bedtime as needed for muscle spasms. 30 each 0   Cholecalciferol (VITAMIN D) 125 MCG (5000 UT) CAPS      ergocalciferol (VITAMIN D2) 1.25 MG (50000 UT) capsule Take 1 capsule (50,000 Units total) by mouth once a week. 8 capsule 0   etodolac (LODINE) 200 MG capsule Take 1 capsule (200 mg total) by mouth 2 (two) times daily as needed. 45 capsule 0   fluticasone (FLONASE) 50 MCG/ACT nasal spray Place 2 sprays into both nostrils daily. 16 g 5   furosemide (LASIX) 20 MG tablet Take 1 tablet (20 mg total) by mouth once daily 30 tablet 3   glucose blood (FREESTYLE LITE) test strip Use to check blood sugar twice daily 100 each 12   loratadine (CLARITIN) 10 MG tablet Take 10 mg by mouth daily as needed for allergies.      metFORMIN  (GLUCOPHAGE-XR) 750 MG 24 hr tablet Take 1 tablet (750 mg total) by mouth daily with breakfast. 90 tablet 1   Rimegepant Sulfate (NURTEC) 75 MG TBDP Take 1 tablet (75 mg total) by mouth as directed. 16 tablet 6   Semaglutide, 2 MG/DOSE, 8 MG/3ML SOPN Inject 2 mg as directed once a week. 9 mL 1   Tavaborole (KERYDIN) 5 % SOLN Apply to affected toenail nightly 10 mL 3   No current facility-administered medications for this visit.    Patient confirms/reports the following allergies:  Allergies  Allergen Reactions   Imitrex [Sumatriptan] Other (See Comments)    Chest pain    Wellbutrin [Bupropion] Other (See Comments)    Increased anxiety and tremors    No orders of the defined types were placed in this encounter.   AUTHORIZATION INFORMATION Primary Insurance: 1D#: Group #:  Secondary Insurance: 1D#: Group #:  SCHEDULE INFORMATION: Date: 04/11/23 Time: Location: [facility]

## 2023-04-10 ENCOUNTER — Encounter: Payer: Self-pay | Admitting: Gastroenterology

## 2023-04-10 ENCOUNTER — Ambulatory Visit: Payer: No Typology Code available for payment source | Admitting: Dermatology

## 2023-04-11 ENCOUNTER — Ambulatory Visit
Admission: RE | Admit: 2023-04-11 | Discharge: 2023-04-11 | Disposition: A | Discharge status: Home / Self Care | Payer: 59 | Attending: Gastroenterology | Admitting: Gastroenterology

## 2023-04-11 ENCOUNTER — Encounter: Payer: Self-pay | Admitting: Gastroenterology

## 2023-04-11 ENCOUNTER — Ambulatory Visit: Payer: 59 | Admitting: Anesthesiology

## 2023-04-11 ENCOUNTER — Encounter
Admission: RE | Disposition: A | Discharge status: Home / Self Care | Payer: Self-pay | Source: Home / Self Care | Attending: Gastroenterology

## 2023-04-11 ENCOUNTER — Other Ambulatory Visit: Payer: Self-pay

## 2023-04-11 DIAGNOSIS — Z1211 Encounter for screening for malignant neoplasm of colon: Secondary | ICD-10-CM

## 2023-04-11 DIAGNOSIS — E785 Hyperlipidemia, unspecified: Secondary | ICD-10-CM | POA: Diagnosis not present

## 2023-04-11 DIAGNOSIS — Z7984 Long term (current) use of oral hypoglycemic drugs: Secondary | ICD-10-CM | POA: Insufficient documentation

## 2023-04-11 DIAGNOSIS — Z6829 Body mass index (BMI) 29.0-29.9, adult: Secondary | ICD-10-CM | POA: Diagnosis not present

## 2023-04-11 DIAGNOSIS — Z800 Family history of malignant neoplasm of digestive organs: Secondary | ICD-10-CM | POA: Diagnosis not present

## 2023-04-11 DIAGNOSIS — E559 Vitamin D deficiency, unspecified: Secondary | ICD-10-CM | POA: Insufficient documentation

## 2023-04-11 DIAGNOSIS — E119 Type 2 diabetes mellitus without complications: Secondary | ICD-10-CM | POA: Insufficient documentation

## 2023-04-11 DIAGNOSIS — E669 Obesity, unspecified: Secondary | ICD-10-CM | POA: Insufficient documentation

## 2023-04-11 HISTORY — PX: COLONOSCOPY WITH PROPOFOL: SHX5780

## 2023-04-11 LAB — POCT PREGNANCY, URINE: Preg Test, Ur: NEGATIVE

## 2023-04-11 LAB — GLUCOSE, CAPILLARY: Glucose-Capillary: 70 mg/dL (ref 70–99)

## 2023-04-11 SURGERY — COLONOSCOPY WITH PROPOFOL
Anesthesia: General

## 2023-04-11 MED ORDER — LIDOCAINE HCL (CARDIAC) PF 100 MG/5ML IV SOSY
PREFILLED_SYRINGE | INTRAVENOUS | Status: DC | PRN
  Administered 2023-04-11: 20 mg via INTRAVENOUS

## 2023-04-11 MED ORDER — PROPOFOL 500 MG/50ML IV EMUL
INTRAVENOUS | Status: DC | PRN
  Administered 2023-04-11: 125 ug/kg/min via INTRAVENOUS

## 2023-04-11 MED ORDER — SODIUM CHLORIDE 0.9 % IV SOLN: INTRAVENOUS | Status: DC

## 2023-04-11 MED ORDER — STERILE WATER FOR IRRIGATION IR SOLN
Status: DC | PRN
  Administered 2023-04-11: 50 mL

## 2023-04-11 MED ORDER — PROPOFOL 10 MG/ML IV BOLUS
INTRAVENOUS | Status: DC | PRN
  Administered 2023-04-11: 20 mg via INTRAVENOUS
  Administered 2023-04-11: 80 mg via INTRAVENOUS

## 2023-04-11 NOTE — Transfer of Care (Signed)
Immediate Anesthesia Transfer of Care Note  Patient: Ashley Wiley  Procedure(s) Performed: COLONOSCOPY WITH PROPOFOL  Patient Location: PACU  Anesthesia Type:General  Level of Consciousness: drowsy  Airway & Oxygen Therapy: Patient Spontanous Breathing  Post-op Assessment: Report given to RN and Post -op Vital signs reviewed and stable  Post vital signs: Reviewed and stable  Last Vitals:  Vitals Value Taken Time  BP 101/59 04/11/23 1149  Temp 97   Pulse 76 04/11/23 1150  Resp 17 04/11/23 1150  SpO2 100 % 04/11/23 1150  Vitals shown include unvalidated device data.  Last Pain:  Vitals:   04/11/23 1030  TempSrc: Temporal  PainSc: 0-No pain         Complications: No notable events documented.

## 2023-04-11 NOTE — Anesthesia Preprocedure Evaluation (Signed)
Anesthesia Evaluation  Patient identified by MRN, date of birth, ID band Patient awake    Reviewed: Allergy & Precautions, NPO status , Patient's Chart, lab work & pertinent test results  History of Anesthesia Complications Negative for: history of anesthetic complications  Airway Mallampati: III  TM Distance: <3 FB Neck ROM: full    Dental  (+) Chipped   Pulmonary neg pulmonary ROS, neg shortness of breath   Pulmonary exam normal        Cardiovascular (-) angina negative cardio ROS Normal cardiovascular exam     Neuro/Psych  Headaches PSYCHIATRIC DISORDERS       Neuromuscular disease    GI/Hepatic Neg liver ROS,GERD  Controlled,,  Endo/Other  diabetes, Type 2    Renal/GU negative Renal ROS  negative genitourinary   Musculoskeletal   Abdominal   Peds  Hematology negative hematology ROS (+)   Anesthesia Other Findings Past Medical History: No date: Allergy No date: Aneurysm (HCC) No date: Anxiety No date: Back pain No date: Constipation No date: Depression No date: Dermatofibroma No date: Diabetes mellitus without complication (HCC)     Comment:  Takes Metformin  dx 2016 No date: Facial pain No date: Facial pain No date: GERD (gastroesophageal reflux disease) No date: Headache No date: Hyperlipidemia No date: IFG (impaired fasting glucose) No date: Low HDL (under 40) No date: Migraine No date: Obesity No date: Overweight No date: Patella-femoral syndrome No date: Pinguecula No date: Prediabetes april 2016: Thyroid nodule No date: Vitamin B12 deficiency No date: Vitamin D deficiency disease  Past Surgical History: No date: CARPAL TUNNEL RELEASE No date: GANGLION CYST EXCISION; Left 12/04/2017: IR 3D INDEPENDENT WKST 12/04/2017: IR ANGIO INTRA EXTRACRAN SEL INTERNAL CAROTID BILAT MOD SED 05/17/2021: IR ANGIO INTRA EXTRACRAN SEL INTERNAL CAROTID BILAT MOD SED 03/05/2018: IR ANGIO INTRA EXTRACRAN SEL  INTERNAL CAROTID UNI L MOD SED 05/02/2019: IR ANGIO INTRA EXTRACRAN SEL INTERNAL CAROTID UNI L MOD SED 12/04/2017: IR ANGIO VERTEBRAL SEL VERTEBRAL BILAT MOD SED 05/17/2021: IR ANGIO VERTEBRAL SEL VERTEBRAL UNI R MOD SED 03/05/2018: IR ANGIOGRAM FOLLOW UP STUDY 03/05/2018: IR ANGIOGRAM FOLLOW UP STUDY 03/05/2018: IR ANGIOGRAM FOLLOW UP STUDY 03/05/2018: IR NEURO EACH ADD'L AFTER BASIC UNI LEFT (MS) 03/05/2018: IR TRANSCATH/EMBOLIZ 05/17/2021: IR US GUIDE VASC ACCESS RIGHT Nov 2015: NASAL SINUS SURGERY 03/05/2018: RADIOLOGY WITH ANESTHESIA; N/A     Comment:  Procedure: Arteriogram, coil embolization, possible               stenting;  Surgeon: Lisbeth Renshaw, MD;  Location: [facility]              OR;  Service: Radiology;  Laterality: N/A; No date: TUBAL LIGATION  BMI    Body Mass Index: 29.69 kg/m      Reproductive/Obstetrics negative OB ROS                             Anesthesia Physical Anesthesia Plan  ASA: 3  Anesthesia Plan: General   Post-op Pain Management:    Induction: Intravenous  PONV Risk Score and Plan: Propofol infusion and TIVA  Airway Management Planned: Natural Airway and Nasal Cannula  Additional Equipment:   Intra-op Plan:   Post-operative Plan:   Informed Consent: I have reviewed the patients History and Physical, chart, labs and discussed the procedure including the risks, benefits and alternatives for the proposed anesthesia with the patient or authorized representative who has indicated his/her understanding and acceptance.  Dental Advisory Given  Plan Discussed with: Anesthesiologist, CRNA and Surgeon  Anesthesia Plan Comments: (Patient consented for risks of anesthesia including but not limited to:  - adverse reactions to medications - risk of airway placement if required - damage to eyes, teeth, lips or other oral mucosa - nerve damage due to positioning  - sore throat or hoarseness - Damage to heart, brain, nerves,  lungs, other parts of body or loss of life  Patient voiced understanding.)       Anesthesia Quick Evaluation

## 2023-04-11 NOTE — Anesthesia Postprocedure Evaluation (Signed)
Anesthesia Post Note  Patient: Ashley Wiley  Procedure(s) Performed: COLONOSCOPY WITH PROPOFOL  Patient location during evaluation: Endoscopy Anesthesia Type: General Level of consciousness: awake and alert Pain management: pain level controlled Vital Signs Assessment: post-procedure vital signs reviewed and stable Respiratory status: spontaneous breathing, nonlabored ventilation, respiratory function stable and patient connected to nasal cannula oxygen Cardiovascular status: blood pressure returned to baseline and stable Postop Assessment: no apparent nausea or vomiting Anesthetic complications: no   No notable events documented.   Last Vitals:  Vitals:   04/11/23 1200 04/11/23 1210  BP: 104/62 122/87  Pulse: 79 74  Resp: 16 15  Temp:    SpO2: 100% 100%    Last Pain:  Vitals:   04/11/23 1200  TempSrc:   PainSc: 0-No pain                 [name] [name]

## 2023-04-11 NOTE — H&P (Signed)
Arlyss Repress, MD [address]  [address]  [place], [place] [address]  Main: [phone]  Fax: [phone] Pager: [phone]  Primary Care Physician:  Dorcas Carrow, DO Primary Gastroenterologist:  Dr. Arlyss Repress  Pre-Procedure History & Physical: HPI:  Ashley Wiley is a 47 y.o. female is here for an colonoscopy.   Past Medical History:  Diagnosis Date   Allergy    Aneurysm (HCC)    Anxiety    Back pain    Constipation    Depression    Dermatofibroma    Diabetes mellitus without complication (HCC)    Takes Metformin  dx 2016   Facial pain    Facial pain    GERD (gastroesophageal reflux disease)    Headache    Hyperlipidemia    IFG (impaired fasting glucose)    Low HDL (under 40)    Migraine    Obesity    Overweight    Patella-femoral syndrome    Pinguecula    Prediabetes    Thyroid nodule april 2016   Vitamin B12 deficiency    Vitamin D deficiency disease     Past Surgical History:  Procedure Laterality Date   CARPAL TUNNEL RELEASE     GANGLION CYST EXCISION Left    IR 3D INDEPENDENT WKST  12/04/2017   IR ANGIO INTRA EXTRACRAN SEL INTERNAL CAROTID BILAT MOD SED  12/04/2017   IR ANGIO INTRA EXTRACRAN SEL INTERNAL CAROTID BILAT MOD SED  05/17/2021   IR ANGIO INTRA EXTRACRAN SEL INTERNAL CAROTID UNI L MOD SED  03/05/2018   IR ANGIO INTRA EXTRACRAN SEL INTERNAL CAROTID UNI L MOD SED  05/02/2019   IR ANGIO VERTEBRAL SEL VERTEBRAL BILAT MOD SED  12/04/2017   IR ANGIO VERTEBRAL SEL VERTEBRAL UNI R MOD SED  05/17/2021   IR ANGIOGRAM FOLLOW UP STUDY  03/05/2018   IR ANGIOGRAM FOLLOW UP STUDY  03/05/2018   IR ANGIOGRAM FOLLOW UP STUDY  03/05/2018   IR NEURO EACH ADD'L AFTER BASIC UNI LEFT (MS)  03/05/2018   IR TRANSCATH/EMBOLIZ  03/05/2018   IR US GUIDE VASC ACCESS RIGHT  05/17/2021   NASAL SINUS SURGERY  Nov 2015   RADIOLOGY WITH ANESTHESIA N/A 03/05/2018   Procedure: Arteriogram, coil embolization, possible stenting;  Surgeon: Lisbeth Renshaw, MD;   Location: [facility] OR;  Service: Radiology;  Laterality: N/A;   TUBAL LIGATION      Prior to Admission medications   Medication Sig Start Date End Date Taking? Authorizing Provider  baclofen (LIORESAL) 10 MG tablet Take 1 tablet (10 mg total) by mouth at bedtime as needed for muscle spasms. 03/23/23  Yes Johnson, Megan P, DO  etodolac (LODINE) 200 MG capsule Take 1 capsule (200 mg total) by mouth 2 (two) times daily as needed. 03/15/23  Yes   fluticasone (FLONASE) 50 MCG/ACT nasal spray Place 2 sprays into both nostrils daily. 09/01/22  Yes Johnson, Megan P, DO  furosemide (LASIX) 20 MG tablet Take 1 tablet (20 mg total) by mouth once daily 11/01/22  Yes Johnson, Megan P, DO  loratadine (CLARITIN) 10 MG tablet Take 10 mg by mouth daily as needed for allergies.    Yes [provider]  metFORMIN (GLUCOPHAGE-XR) 750 MG 24 hr tablet Take 1 tablet (750 mg total) by mouth daily with breakfast. 11/01/22  Yes Johnson, Megan P, DO  Azelaic Acid (FINACEA) 15 % FOAM Apply to face once to twice daily as directed. 09/20/22   Willeen Niece, MD  Cholecalciferol (VITAMIN D)  125 MCG (5000 UT) CAPS  09/20/21   [provider]  ergocalciferol (VITAMIN D2) 1.25 MG (50000 UT) capsule Take 1 capsule (50,000 Units total) by mouth once a week. 03/21/23     glucose blood (FREESTYLE LITE) test strip Use to check blood sugar twice daily 09/01/22   Johnson, Megan P, DO  Na Sulfate-K Sulfate-Mg Sulf 17.5-3.13-1.6 GM/177ML SOLN Take 1 kit by mouth once for 1 dose. 03/29/23 04/11/23  Toney Reil, MD  Rimegepant Sulfate (NURTEC) 75 MG TBDP Take 1 tablet (75 mg total) by mouth as directed. 02/17/22     Semaglutide, 2 MG/DOSE, 8 MG/3ML SOPN Inject 2 mg as directed once a week. 11/01/22   Johnson, Megan P, DO  Tavaborole (KERYDIN) 5 % SOLN Apply to affected toenail nightly 09/20/22   Willeen Niece, MD    Allergies as of 03/29/2023 - Review Complete 03/29/2023  Allergen Reaction Noted   Imitrex [sumatriptan] Other (See  Comments) 04/08/2018   Wellbutrin [bupropion] Other (See Comments) 05/11/2017    Family History  Problem Relation Age of Onset   Depression Mother    Anxiety disorder Mother    Heart attack Father 75   Heart failure Father    Diabetes Father    Hypertension Father    Heart disease Father    Kidney disease Father    Stroke Father    Aneurysm Father    Hyperlipidemia Father    Colon cancer Brother    Diabetes Paternal Grandfather    Sudden death Neg Hx     Social History   Socioeconomic History   Marital status: Married    Spouse name: Not on file   Number of children: Not on file   Years of education: Not on file   Highest education level: Associate degree: academic program  Occupational History   Not on file  Tobacco Use   Smoking status: Never   Smokeless tobacco: Never  Vaping Use   Vaping Use: Never used  Substance and Sexual Activity   Alcohol use: Not Currently    Alcohol/week: 0.0 - 1.0 standard drinks of alcohol    Comment: Drinks 3-4 times per year   Drug use: No   Sexual activity: Yes    Birth control/protection: None  Other Topics Concern   Not on file  Social History Narrative   Not on file   Social Determinants of Health   Financial Resource Strain: Low Risk  (03/23/2023)   Overall Financial Resource Strain (CARDIA)    Difficulty of Paying Living Expenses: Not hard at all  Food Insecurity: No Food Insecurity (03/23/2023)   Hunger Vital Sign    Worried About Running Out of Food in the Last Year: Never true    Ran Out of Food in the Last Year: Never true  Transportation Needs: No Transportation Needs (03/23/2023)   PRAPARE - Administrator, Civil Service (Medical): No    Lack of Transportation (Non-Medical): No  Physical Activity: Insufficiently Active (03/23/2023)   Exercise Vital Sign    Days of Exercise per Week: 3 days    Minutes of Exercise per Session: 30 min  Stress: Stress Concern Present (03/23/2023)   [employer] of  Occupational Health - Occupational Stress Questionnaire    Feeling of Stress : To some extent  Social Connections: Moderately Integrated (03/23/2023)   Social Connection and Isolation Panel [NHANES]    Frequency of Communication with Friends and Family: More than three times a week    Frequency  of Social Gatherings with Friends and Family: Three times a week    Attends Religious Services: More than 4 times per year    Active Member of Clubs or Organizations: No    Attends Banker Meetings: Not on file    Marital Status: Married  Intimate Partner Violence: Not At Risk (02/07/2018)   Humiliation, Afraid, Rape, and Kick questionnaire    Fear of Current or Ex-Partner: No    Emotionally Abused: No    Physically Abused: No    Sexually Abused: No    Review of Systems: See HPI, otherwise negative ROS  Physical Exam: BP 125/81   Pulse 71   Temp (!) 97.2 F (36.2 C) (Temporal)   Resp 16   Ht 5' (1.524 m)   Wt 68.9 kg   SpO2 100%   BMI 29.69 kg/m  General:   Alert,  pleasant and cooperative in NAD Head:  Normocephalic and atraumatic. Neck:  Supple; no masses or thyromegaly. Lungs:  Clear throughout to auscultation.    Heart:  Regular rate and rhythm. Abdomen:  Soft, nontender and nondistended. Normal bowel sounds, without guarding, and without rebound.   Neurologic:  Alert and  oriented x4;  grossly normal neurologically.  Impression/Plan: Ashley Wiley is here for an colonoscopy to be performed for colon cancer screening, brother with colon cancer at age 31  Risks, benefits, limitations, and alternatives regarding  colonoscopy have been reviewed with the patient.  Questions have been answered.  All parties agreeable.   Lannette Donath, MD  04/11/2023, 11:02 AM

## 2023-04-11 NOTE — Op Note (Signed)
[facility] Gastroenterology Patient Name: Ashley Wiley Procedure Date: 04/11/2023 11:18 AM MRN: [id number] Account #: [id number] Date of Birth: 1976-04-09 Admit Type: Outpatient Age: 47 Room: [facility] ENDO ROOM 2 Gender: Female Note Status: Finalized Instrument Name: Prentice Docker [id number] Procedure:             Colonoscopy Indications:           Screening in patient at increased risk: Colorectal                         cancer in brother before age 23 Providers:             Judah Carchi Alger Memos MD, MD Medicines:             General Anesthesia Complications:         No immediate complications. Estimated blood loss: None. Procedure:             Pre-Anesthesia Assessment:                        - Prior to the procedure, a History and Physical was                         performed, and patient medications and allergies were                         reviewed. The patient is competent. The risks and                         benefits of the procedure and the sedation options and                         risks were discussed with the patient. All questions                         were answered and informed consent was obtained.                         Patient identification and proposed procedure were                         verified by the physician, the nurse, the                         anesthesiologist, the anesthetist and the technician                         in the pre-procedure area in the procedure room in the                         endoscopy suite. Mental Status Examination: alert and                         oriented. Airway Examination: normal oropharyngeal                         airway and neck mobility. Respiratory Examination:                         clear  to auscultation. CV Examination: normal.                         Prophylactic Antibiotics: The patient does not require                         prophylactic antibiotics. Prior Anticoagulants: The                          patient has taken no anticoagulant or antiplatelet                         agents. ASA Grade Assessment: III - A patient with                         severe systemic disease. After reviewing the risks and                         benefits, the patient was deemed in satisfactory                         condition to undergo the procedure. The anesthesia                         plan was to use general anesthesia. Immediately prior                         to administration of medications, the patient was                         re-assessed for adequacy to receive sedatives. The                         heart rate, respiratory rate, oxygen saturations,                         blood pressure, adequacy of pulmonary ventilation, and                         response to care were monitored throughout the                         procedure. The physical status of the patient was                         re-assessed after the procedure.                        After obtaining informed consent, the colonoscope was                         passed under direct vision. Throughout the procedure,                         the patient's blood pressure, pulse, and oxygen                         saturations were monitored continuously. The  Colonoscope was introduced through the anus and                         advanced to the the cecum, identified by appendiceal                         orifice and ileocecal valve. The colonoscopy was                         performed without difficulty. The patient tolerated                         the procedure well. The quality of the bowel                         preparation was evaluated using the BBPS [facility] Bowel                         Preparation Scale) with scores of: Right Colon = 3,                         Transverse Colon = 3 and Left Colon = 3 (entire mucosa                         seen well with no residual staining, small fragments                          of stool or opaque liquid). The total BBPS score                         equals 9. The ileocecal valve, appendiceal orifice,                         and rectum were photographed. Findings:      The perianal and digital rectal examinations were normal. Pertinent       negatives include normal sphincter tone and no palpable rectal lesions.      The entire examined colon appeared normal.      The retroflexed view of the distal rectum and anal verge was normal and       showed no anal or rectal abnormalities. Impression:            - The entire examined colon is normal.                        - The distal rectum and anal verge are normal on                         retroflexion view.                        - No specimens collected. Recommendation:        - Discharge patient to home (with escort).                        - Resume previous diet today.                        -  Continue present medications.                        - Repeat colonoscopy in 5 years for screening purposes. Procedure Code(s):     --- Professional ---                        Z6109, Colorectal cancer screening; colonoscopy on                         individual at high risk Diagnosis Code(s):     --- Professional ---                        Z80.0, Family history of malignant neoplasm of                         digestive organs CPT copyright 2022 American Medical Association. All rights reserved. The codes documented in this report are preliminary and upon coder review may  be revised to meet current compliance requirements. Dr. Libby Maw Toney Reil MD, MD 04/11/2023 11:47:34 AM This report has been signed electronically. Number of Addenda: 0 Note Initiated On: 04/11/2023 11:18 AM Scope Withdrawal Time: 0 hours 8 minutes 34 seconds  Total Procedure Duration: 0 hours 13 minutes 47 seconds  Estimated Blood Loss:  Estimated blood loss: none.      [facility]

## 2023-04-12 ENCOUNTER — Other Ambulatory Visit: Payer: Self-pay | Admitting: Neurosurgery

## 2023-04-12 ENCOUNTER — Encounter: Payer: Self-pay | Admitting: Gastroenterology

## 2023-04-12 DIAGNOSIS — I671 Cerebral aneurysm, nonruptured: Secondary | ICD-10-CM

## 2023-04-30 ENCOUNTER — Ambulatory Visit
Admission: RE | Admit: 2023-04-30 | Discharge: 2023-04-30 | Disposition: A | Discharge status: Home / Self Care | Payer: 59 | Source: Ambulatory Visit | Attending: Neurosurgery | Admitting: Neurosurgery

## 2023-04-30 DIAGNOSIS — I671 Cerebral aneurysm, nonruptured: Secondary | ICD-10-CM | POA: Insufficient documentation

## 2023-05-09 ENCOUNTER — Ambulatory Visit (INDEPENDENT_AMBULATORY_CARE_PROVIDER_SITE_OTHER): Payer: 59 | Admitting: Family Medicine

## 2023-05-09 ENCOUNTER — Other Ambulatory Visit (HOSPITAL_COMMUNITY)
Admission: RE | Admit: 2023-05-09 | Discharge: 2023-05-09 | Disposition: A | Discharge status: Home / Self Care | Payer: 59 | Source: Ambulatory Visit | Attending: Family Medicine | Admitting: Family Medicine

## 2023-05-09 ENCOUNTER — Other Ambulatory Visit: Payer: Self-pay

## 2023-05-09 ENCOUNTER — Encounter: Payer: Self-pay | Admitting: Family Medicine

## 2023-05-09 VITALS — BP 106/72 | HR 72 | Temp 98.5°F | Ht 60.0 in | Wt 153.4 lb

## 2023-05-09 DIAGNOSIS — G43009 Migraine without aura, not intractable, without status migrainosus: Secondary | ICD-10-CM | POA: Diagnosis not present

## 2023-05-09 DIAGNOSIS — I671 Cerebral aneurysm, nonruptured: Secondary | ICD-10-CM | POA: Diagnosis not present

## 2023-05-09 DIAGNOSIS — B353 Tinea pedis: Secondary | ICD-10-CM

## 2023-05-09 DIAGNOSIS — Z124 Encounter for screening for malignant neoplasm of cervix: Secondary | ICD-10-CM | POA: Insufficient documentation

## 2023-05-09 DIAGNOSIS — E782 Mixed hyperlipidemia: Secondary | ICD-10-CM

## 2023-05-09 DIAGNOSIS — E559 Vitamin D deficiency, unspecified: Secondary | ICD-10-CM | POA: Diagnosis not present

## 2023-05-09 DIAGNOSIS — Z7984 Long term (current) use of oral hypoglycemic drugs: Secondary | ICD-10-CM

## 2023-05-09 DIAGNOSIS — E1165 Type 2 diabetes mellitus with hyperglycemia: Secondary | ICD-10-CM

## 2023-05-09 DIAGNOSIS — F332 Major depressive disorder, recurrent severe without psychotic features: Secondary | ICD-10-CM

## 2023-05-09 LAB — MICROALBUMIN, URINE WAIVED
Creatinine, Urine Waived: 10 mg/dL (ref 10–300)
Microalb, Ur Waived: 10 mg/L (ref 0–19)

## 2023-05-09 LAB — BAYER DCA HB A1C WAIVED: HB A1C (BAYER DCA - WAIVED): 5.7 % — ABNORMAL HIGH (ref 4.8–5.6)

## 2023-05-09 MED ORDER — SEMAGLUTIDE (2 MG/DOSE) 8 MG/3ML ~~LOC~~ SOPN
2.0000 mg | PEN_INJECTOR | SUBCUTANEOUS | 1 refills | Status: DC
  Filled 2023-05-09: qty 3, 28d supply, fill #0
  Filled 2023-06-07: qty 3, 28d supply, fill #1
  Filled 2023-07-27: qty 3, 28d supply, fill #2
  Filled 2023-08-31: qty 3, 28d supply, fill #3
  Filled 2023-10-22: qty 3, 28d supply, fill #4

## 2023-05-09 MED ORDER — CLOTRIMAZOLE-BETAMETHASONE 1-0.05 % EX CREA
1.0000 | TOPICAL_CREAM | Freq: Every day | CUTANEOUS | 0 refills | Status: DC
  Filled 2023-05-09: qty 30, 30d supply, fill #0

## 2023-05-09 MED ORDER — ETODOLAC 200 MG PO CAPS
200.0000 mg | ORAL_CAPSULE | Freq: Two times a day (BID) | ORAL | 0 refills | Status: DC | PRN
  Filled 2023-05-09: qty 45, 23d supply, fill #0

## 2023-05-09 MED ORDER — BACLOFEN 10 MG PO TABS
10.0000 mg | ORAL_TABLET | Freq: Every evening | ORAL | 3 refills | Status: DC | PRN
  Filled 2023-05-09: qty 30, 30d supply, fill #0
  Filled 2023-07-27: qty 30, 30d supply, fill #1

## 2023-05-09 MED ORDER — METFORMIN HCL ER 500 MG PO TB24
500.0000 mg | ORAL_TABLET | Freq: Every day | ORAL | 1 refills | Status: DC
  Filled 2023-05-09: qty 90, 90d supply, fill #0

## 2023-05-09 MED ORDER — FUROSEMIDE 20 MG PO TABS
20.0000 mg | ORAL_TABLET | Freq: Every day | ORAL | 3 refills | Status: DC
  Filled 2023-06-07: qty 30, 30d supply, fill #0
  Filled 2023-07-27: qty 30, 30d supply, fill #1
  Filled 2023-08-31: qty 30, 30d supply, fill #2

## 2023-05-09 NOTE — Progress Notes (Signed)
BP 106/72   Pulse 72   Temp 98.5 F (36.9 C) (Oral)   Ht 5' (1.524 m)   Wt 153 lb 6.4 oz (69.6 kg)   SpO2 96%   BMI 29.96 kg/m    Subjective:    Patient ID: Ashley Wiley, female    DOB: 10-07-1976, 47 y.o.   MRN: [id number]  HPI: Ashley Wiley is a 47 y.o. female  No chief complaint on file.  DIABETES Hypoglycemic episodes:{Blank single:19197::"yes","no"} Polydipsia/polyuria: {Blank single:19197::"yes","no"} Visual disturbance: {Blank single:19197::"yes","no"} Chest pain: {Blank single:19197::"yes","no"} Paresthesias: {Blank single:19197::"yes","no"} Glucose Monitoring: {Blank single:19197::"yes","no"}  Accucheck frequency: {Blank single:19197::"Not Checking","Daily","BID","TID"}  Fasting glucose:  Post prandial:  Evening:  Before meals: Taking Insulin?: {Blank single:19197::"yes","no"}  Long acting insulin:  Short acting insulin: Blood Pressure Monitoring: {Blank single:19197::"not checking","rarely","daily","weekly","monthly","a few times a day","a few times a week","a few times a month"} Retinal Examination: {Blank single:19197::"Up to Date","Not up to Date"} Foot Exam: {Blank single:19197::"Up to Date","Not up to Date"} Diabetic Education: {Blank single:19197::"Completed","Not Completed"} Pneumovax: {Blank single:19197::"Up to Date","Not up to Date","unknown"} Influenza: {Blank single:19197::"Up to Date","Not up to Date","unknown"} Aspirin: {Blank single:19197::"yes","no"}  HYPERLIPIDEMIA Hyperlipidemia status: {Blank single:19197::"excellent compliance","good compliance","fair compliance","poor compliance"} Satisfied with current treatment?  {Blank single:19197::"yes","no"} Side effects:  {Blank single:19197::"yes","no"} Medication compliance: {Blank single:19197::"excellent compliance","good compliance","fair compliance","poor compliance"} Past cholesterol meds: {Blank multiple:19196::"none","atorvastain (lipitor)","lovastatin (mevacor)","pravastatin  (pravachol)","rosuvastatin (crestor)","simvastatin (zocor)","vytorin","fenofibrate (tricor)","gemfibrozil","ezetimide (zetia)","niaspan","lovaza"} Supplements: {Blank multiple:19196::"none","fish oil","niacin","red yeast rice"} Aspirin:  {Blank single:19197::"yes","no"} The 10-year ASCVD risk score (Arnett DK, et al., 2019) is: 1.6%   Values used to calculate the score:     Age: 63 years     Sex: Female     Is Non-Hispanic African American: No     Diabetic: Yes     Tobacco smoker: No     Systolic Blood Pressure: 106 mmHg     Is BP treated: No     HDL Cholesterol: 34 mg/dL     Total Cholesterol: 157 mg/dL Chest pain:  {Blank [id number]:91478::"GNF","AO"} Coronary artery disease:  {Blank single:19197::"yes","no"} Family history CAD:  {Blank single:19197::"yes","no"} Family history early CAD:  {Blank single:19197::"yes","no"}  ANXIETY/STRESS Duration:{Blank single:19197::"controlled","uncontrolled","better","worse","exacerbated","stable"} Anxious mood: {Blank single:19197::"yes","no"}  Excessive worrying: {Blank single:19197::"yes","no"} Irritability: {Blank single:19197::"yes","no"}  Sweating: {Blank single:19197::"yes","no"} Nausea: {Blank single:19197::"yes","no"} Palpitations:{Blank single:19197::"yes","no"} Hyperventilation: {Blank single:19197::"yes","no"} Panic attacks: {Blank single:19197::"yes","no"} Agoraphobia: {Blank single:19197::"yes","no"}  Obscessions/compulsions: {Blank single:19197::"yes","no"} Depressed mood: {Blank single:19197::"yes","no"}    05/09/2023    8:43 AM 11/01/2022    8:45 AM 06/02/2022    2:13 PM 04/19/2022    1:31 PM 10/19/2021   10:33 AM  Depression screen PHQ 2/9  Decreased Interest 0 0 0 0 0  Down, Depressed, Hopeless 0 0 0 0 0  PHQ - 2 Score 0 0 0 0 0  Altered sleeping 0 0 0 0 2  Tired, decreased energy 0 0 0 0 0  Change in appetite 0 0 0 0 0  Feeling bad or failure about yourself  0 0 0 0 0  Trouble concentrating 0 0 0 0 0  Moving slowly or  fidgety/restless 0 0 0 0 0  Suicidal thoughts 0 0 0 0 0  PHQ-9 Score 0 0 0 0 2  Difficult doing work/chores Not difficult at all Not difficult at all Not difficult at all Not difficult at all Not difficult at all   Anhedonia: {Blank single:19197::"yes","no"} Weight changes: {Blank single:19197::"yes","no"} Insomnia: {Blank single:19197::"yes","no"} {Blank single:19197::"hard to fall asleep","hard to stay asleep"}  Hypersomnia: {Blank single:19197::"yes","no"} Fatigue/loss of energy: {Blank single:19197::"yes","no"} Feelings of worthlessness: {Blank single:19197::"yes","no"} Feelings of guilt: {Blank single:19197::"yes","no"}  Impaired concentration/indecisiveness: {Blank single:19197::"yes","no"} Suicidal ideations: {Blank single:19197::"yes","no"}  Crying spells: {Blank single:19197::"yes","no"} Recent Stressors/Life Changes: {Blank single:19197::"yes","no"}   Relationship problems: {Blank single:19197::"yes","no"}   Family stress: {Blank single:19197::"yes","no"}     Financial stress: {Blank single:19197::"yes","no"}    Job stress: {Blank single:19197::"yes","no"}    Recent death/loss: {Blank single:19197::"yes","no"}   Relevant past medical, surgical, family and social history reviewed and updated as indicated. Interim medical history since our last visit reviewed. Allergies and medications reviewed and updated.  Review of Systems  Constitutional: Negative.   Respiratory: Negative.    Cardiovascular: Negative.   Gastrointestinal: Negative.   Musculoskeletal: Negative.   Neurological:  Positive for headaches. Negative for dizziness, tremors, seizures, syncope, facial asymmetry, speech difficulty, weakness, light-headedness and numbness.  Hematological: Negative.   Psychiatric/Behavioral: Negative.      Per HPI unless specifically indicated above     Objective:    BP 106/72   Pulse 72   Temp 98.5 F (36.9 C) (Oral)   Ht 5' (1.524 m)   Wt 153 lb 6.4 oz (69.6 kg)   SpO2  96%   BMI 29.96 kg/m   Wt Readings from Last 3 Encounters:  05/09/23 153 lb 6.4 oz (69.6 kg)  04/11/23 152 lb (68.9 kg)  03/23/23 153 lb (69.4 kg)    Physical Exam Vitals and nursing note reviewed.  Constitutional:      General: She is not in acute distress.    Appearance: Normal appearance. She is not ill-appearing, toxic-appearing or diaphoretic.  HENT:     Head: Normocephalic and atraumatic.     Right Ear: External ear normal.     Left Ear: External ear normal.     Nose: Nose normal.     Mouth/Throat:     Mouth: Mucous membranes are moist.     Pharynx: Oropharynx is clear.  Eyes:     General: No scleral icterus.       Right eye: No discharge.        Left eye: No discharge.     Extraocular Movements: Extraocular movements intact.     Conjunctiva/sclera: Conjunctivae normal.     Pupils: Pupils are equal, round, and reactive to light.  Cardiovascular:     Rate and Rhythm: Normal rate and regular rhythm.     Pulses: Normal pulses.     Heart sounds: Normal heart sounds. No murmur heard.    No friction rub. No gallop.  Pulmonary:     Effort: Pulmonary effort is normal. No respiratory distress.     Breath sounds: Normal breath sounds. No stridor. No wheezing, rhonchi or rales.  Chest:     Chest wall: No tenderness.  Musculoskeletal:        General: Normal range of motion.     Cervical back: Normal range of motion and neck supple.  Skin:    General: Skin is warm and dry.     Capillary Refill: Capillary refill takes less than 2 seconds.     Coloration: Skin is not jaundiced or pale.     Findings: No bruising, erythema, lesion or rash.  Neurological:     General: No focal deficit present.     Mental Status: She is alert and oriented to person, place, and time. Mental status is at baseline.  Psychiatric:        Mood and Affect: Mood normal.        Behavior: Behavior normal.        Thought Content: Thought content normal.  Judgment: Judgment normal.    Results for  orders placed or performed during the hospital encounter of 04/11/23  Glucose, capillary  Result Value Ref Range   Glucose-Capillary 70 70 - 99 mg/dL  Pregnancy, urine POC  Result Value Ref Range   Preg Test, Ur NEGATIVE NEGATIVE      Assessment & Plan:   Problem List Items Addressed This Visit       Endocrine   Type 2 diabetes mellitus with hyperglycemia, without long-term current use of insulin (HCC) - Primary   Relevant Orders   Bayer DCA Hb A1c Waived   CBC with Differential/Platelet   Comprehensive metabolic panel     Other   Hyperlipidemia   Relevant Orders   CBC with Differential/Platelet   Comprehensive metabolic panel   Lipid Panel w/o Chol/HDL Ratio   Vitamin D deficiency disease   Relevant Orders   CBC with Differential/Platelet   Comprehensive metabolic panel   VITAMIN D 25 Hydroxy (Vit-D Deficiency, Fractures)     Follow up plan: No follow-ups on file.

## 2023-05-10 ENCOUNTER — Other Ambulatory Visit (HOSPITAL_COMMUNITY): Payer: Self-pay

## 2023-05-10 LAB — CBC WITH DIFFERENTIAL/PLATELET
Basophils Absolute: 0 10*3/uL (ref 0.0–0.2)
Basos: 1 %
EOS (ABSOLUTE): 0.2 10*3/uL (ref 0.0–0.4)
Eos: 4 %
Hematocrit: 40.4 % (ref 34.0–46.6)
Hemoglobin: 13.4 g/dL (ref 11.1–15.9)
Immature Grans (Abs): 0 10*3/uL (ref 0.0–0.1)
Immature Granulocytes: 0 %
Lymphocytes Absolute: 1.9 10*3/uL (ref 0.7–3.1)
Lymphs: 31 %
MCH: 30 pg (ref 26.6–33.0)
MCHC: 33.2 g/dL (ref 31.5–35.7)
MCV: 90 fL (ref 79–97)
Monocytes Absolute: 0.3 10*3/uL (ref 0.1–0.9)
Monocytes: 5 %
Neutrophils Absolute: 3.6 10*3/uL (ref 1.4–7.0)
Neutrophils: 59 %
Platelets: 168 10*3/uL (ref 150–450)
RBC: 4.47 x10E6/uL (ref 3.77–5.28)
RDW: 13.1 % (ref 11.7–15.4)
WBC: 6.1 10*3/uL (ref 3.4–10.8)

## 2023-05-10 LAB — COMPREHENSIVE METABOLIC PANEL
ALT: 16 IU/L (ref 0–32)
AST: 20 IU/L (ref 0–40)
Albumin: 4.3 g/dL (ref 3.9–4.9)
Alkaline Phosphatase: 80 IU/L (ref 44–121)
BUN/Creatinine Ratio: 19 (ref 9–23)
BUN: 11 mg/dL (ref 6–24)
Bilirubin Total: 0.7 mg/dL (ref 0.0–1.2)
CO2: 25 mmol/L (ref 20–29)
Calcium: 9.3 mg/dL (ref 8.7–10.2)
Chloride: 99 mmol/L (ref 96–106)
Creatinine, Ser: 0.59 mg/dL (ref 0.57–1.00)
Globulin, Total: 2.2 g/dL (ref 1.5–4.5)
Glucose: 132 mg/dL — ABNORMAL HIGH (ref 70–99)
Potassium: 4 mmol/L (ref 3.5–5.2)
Sodium: 137 mmol/L (ref 134–144)
Total Protein: 6.5 g/dL (ref 6.0–8.5)
eGFR: 113 mL/min/{1.73_m2} (ref >59)

## 2023-05-10 LAB — LIPID PANEL W/O CHOL/HDL RATIO
Cholesterol, Total: 183 mg/dL (ref 100–199)
HDL: 31 mg/dL — ABNORMAL LOW (ref >39)
LDL Chol Calc (NIH): 106 mg/dL — ABNORMAL HIGH (ref 0–99)
Triglycerides: 265 mg/dL — ABNORMAL HIGH (ref 0–149)
VLDL Cholesterol Cal: 46 mg/dL — ABNORMAL HIGH (ref 5–40)

## 2023-05-10 LAB — VITAMIN D 25 HYDROXY (VIT D DEFICIENCY, FRACTURES): Vit D, 25-Hydroxy: 28.7 ng/mL — ABNORMAL LOW (ref 30.0–100.0)

## 2023-05-10 NOTE — Assessment & Plan Note (Signed)
Rechecking labs today. Await results. Treat as needed.  °

## 2023-05-10 NOTE — Assessment & Plan Note (Signed)
Under good control on current regimen. Continue current regimen. Continue to monitor. Call with any concerns. Refills given. Labs drawn today.   

## 2023-05-10 NOTE — Assessment & Plan Note (Signed)
Doing great with A1c of 5.7. Will cut her metformin to 500mg  and recheck 3 months. Call with any concerns. Continue to monitor.

## 2023-05-10 NOTE — Assessment & Plan Note (Signed)
Doing great not on medicine. Continue to monitor. Call with any concerns.

## 2023-05-10 NOTE — Assessment & Plan Note (Signed)
Stable. Continue to follow with neurosurgery. Call with any concerns. Continue to monitor.

## 2023-05-10 NOTE — Assessment & Plan Note (Signed)
Stable. Continue to follow with neurology. Call with any concerns. Continue to monitor.  

## 2023-05-11 LAB — CYTOLOGY - PAP
Adequacy: ABSENT
Comment: NEGATIVE
Diagnosis: NEGATIVE
Diagnosis: REACTIVE
High risk HPV: NEGATIVE

## 2023-05-14 ENCOUNTER — Encounter: Payer: Self-pay | Admitting: Nurse Practitioner

## 2023-05-14 ENCOUNTER — Ambulatory Visit: Payer: Self-pay | Admitting: *Deleted

## 2023-05-14 ENCOUNTER — Ambulatory Visit (INDEPENDENT_AMBULATORY_CARE_PROVIDER_SITE_OTHER): Payer: 59 | Admitting: Nurse Practitioner

## 2023-05-14 VITALS — BP 129/83 | HR 86 | Temp 98.5°F | Wt 153.2 lb

## 2023-05-14 DIAGNOSIS — J0110 Acute frontal sinusitis, unspecified: Secondary | ICD-10-CM | POA: Diagnosis not present

## 2023-05-14 DIAGNOSIS — J029 Acute pharyngitis, unspecified: Secondary | ICD-10-CM | POA: Diagnosis not present

## 2023-05-14 MED ORDER — AMOXICILLIN 500 MG PO CAPS: 500.0000 mg | ORAL_CAPSULE | Freq: Two times a day (BID) | ORAL | 0 refills | Status: AC

## 2023-05-14 NOTE — Telephone Encounter (Signed)
  Chief Complaint: exposed to strep  and now has sx Symptoms: sore throat, pain with swallowing. Exposed to strep at work Thursday , runny nose, facial pain swelling Frequency: exposed Thursday sx started Friday 05/11/23 Pertinent Negatives: Patient denies difficulty breathing no fever Disposition: [] ED /[] Urgent Care (no appt availability in office) / [x] Appointment(In office/virtual)/ []  [facility]/ [] Home Care/ [] Refused Recommended Disposition /[] [facility]/ []  Follow-up with PCP Additional Notes:   Appt scheduled for today     Reason for Disposition  [1] Strep throat EXPOSURE within past 10 days AND [2] sore throat  Answer Assessment - Initial Assessment Questions 1. STREP EXPOSURE: "Was the exposure to someone who lives within your home?" If not, ask: "How much contact did you have with the sick person?"      At work  2. ONSET: "How many days ago did the contact occur?"      Thursday  3. PROVEN STREP: "Are you sure the person with strep had a positive throat culture or rapid strep test?"      Yes  4. STREP SYMPTOMS: "Do YOU have a sore throat, fever, or other symptoms suggestive of strep?"      Yes  5. VIRAL SYMPTOMS: "Are there any symptoms of a cold, such as a runny nose, cough, hoarse voice?"     Runny nose facial pain swelling , pain swallowing sore throat 6. PREGNANCY: "Is there any chance you are pregnant?" "When was your last menstrual period?"     na  Protocols used: Strep Throat Exposure-A-AH

## 2023-05-14 NOTE — Progress Notes (Signed)
BP 129/83   Pulse 86   Temp 98.5 F (36.9 C) (Oral)   Wt 153 lb 3.2 oz (69.5 kg)   SpO2 99%   BMI 29.92 kg/m    Subjective:    Patient ID: Ashley Wiley, female    DOB: 1976-01-03, 47 y.o.   MRN: [id number]  HPI: Ashley Wiley is a 47 y.o. female  Chief Complaint  Patient presents with   Sore Throat    Pt states she has been having a sore throat since Friday. States she was exposed to strep on Friday while at work. States she has also been having a headache, congestion, and some post nasal drip.    UPPER RESPIRATORY TRACT INFECTION Worst symptom: symptom started Friday night Fever: no Cough: yes Shortness of breath: no Wheezing: no Chest pain: no Chest tightness: no Chest congestion: no Nasal congestion: yes Runny nose: yes Post nasal drip: yes Sneezing: yes Sore throat: yes Swollen glands: yes Sinus pressure: yes Headache: yes Face pain: yes Toothache: yes Ear pain: yes bilateral Ear pressure: yes bilateral Eyes red/itching:no Eye drainage/crusting: no  Vomiting: no Rash: no Fatigue: yes Sick contacts: yes Strep contacts: yes Context: stable Recurrent sinusitis: no Relief with OTC cold/cough medications: yes  Treatments attempted: pseudoephedrine   Relevant past medical, surgical, family and social history reviewed and updated as indicated. Interim medical history since our last visit reviewed. Allergies and medications reviewed and updated.  Review of Systems  Constitutional:  Positive for fatigue. Negative for fever.  HENT:  Positive for congestion, ear pain, postnasal drip, rhinorrhea, sinus pressure, sinus pain, sneezing and sore throat. Negative for dental problem.   Respiratory:  Positive for cough. Negative for shortness of breath and wheezing.   Cardiovascular:  Negative for chest pain.  Gastrointestinal:  Negative for vomiting.  Skin:  Negative for rash.  Neurological:  Positive for headaches.    Per HPI unless specifically  indicated above     Objective:    BP 129/83   Pulse 86   Temp 98.5 F (36.9 C) (Oral)   Wt 153 lb 3.2 oz (69.5 kg)   SpO2 99%   BMI 29.92 kg/m   Wt Readings from Last 3 Encounters:  05/14/23 153 lb 3.2 oz (69.5 kg)  05/09/23 153 lb 6.4 oz (69.6 kg)  04/11/23 152 lb (68.9 kg)    Physical Exam Vitals and nursing note reviewed.  Constitutional:      General: She is not in acute distress.    Appearance: Normal appearance. She is normal weight. She is not ill-appearing, toxic-appearing or diaphoretic.  HENT:     Head: Normocephalic.     Right Ear: External ear normal. A middle ear effusion is present. Tympanic membrane is erythematous.     Left Ear: External ear normal. A middle ear effusion is present. Tympanic membrane is erythematous.     Nose: Congestion and rhinorrhea present.     Mouth/Throat:     Mouth: Mucous membranes are moist.     Pharynx: Oropharynx is clear. Posterior oropharyngeal erythema present. No oropharyngeal exudate.  Eyes:     General:        Right eye: No discharge.        Left eye: No discharge.     Extraocular Movements: Extraocular movements intact.     Conjunctiva/sclera: Conjunctivae normal.     Pupils: Pupils are equal, round, and reactive to light.  Cardiovascular:     Rate and Rhythm: Normal rate and regular rhythm.  Heart sounds: No murmur heard. Pulmonary:     Effort: Pulmonary effort is normal. No respiratory distress.     Breath sounds: Normal breath sounds. No wheezing or rales.  Musculoskeletal:     Cervical back: Normal range of motion and neck supple.  Skin:    General: Skin is warm and dry.     Capillary Refill: Capillary refill takes less than 2 seconds.  Neurological:     General: No focal deficit present.     Mental Status: She is alert and oriented to person, place, and time. Mental status is at baseline.  Psychiatric:        Mood and Affect: Mood normal.        Behavior: Behavior normal.        Thought Content: Thought  content normal.        Judgment: Judgment normal.     Results for orders placed or performed in visit on 05/09/23  Bayer DCA Hb A1c Waived  Result Value Ref Range   HB A1C (BAYER DCA - WAIVED) 5.7 (H) 4.8 - 5.6 %  CBC with Differential/Platelet  Result Value Ref Range   WBC 6.1 3.4 - 10.8 x10E3/uL   RBC 4.47 3.77 - 5.28 x10E6/uL   Hemoglobin 13.4 11.1 - 15.9 g/dL   Hematocrit 40.9 81.1 - 46.6 %   MCV 90 79 - 97 fL   MCH 30.0 26.6 - 33.0 pg   MCHC 33.2 31.5 - 35.7 g/dL   RDW 91.4 78.2 - 95.6 %   Platelets 168 150 - 450 x10E3/uL   Neutrophils 59 Not Estab. %   Lymphs 31 Not Estab. %   Monocytes 5 Not Estab. %   Eos 4 Not Estab. %   Basos 1 Not Estab. %   Neutrophils Absolute 3.6 1.4 - 7.0 x10E3/uL   Lymphocytes Absolute 1.9 0.7 - 3.1 x10E3/uL   Monocytes Absolute 0.3 0.1 - 0.9 x10E3/uL   EOS (ABSOLUTE) 0.2 0.0 - 0.4 x10E3/uL   Basophils Absolute 0.0 0.0 - 0.2 x10E3/uL   Immature Granulocytes 0 Not Estab. %   Immature Grans (Abs) 0.0 0.0 - 0.1 x10E3/uL  Comprehensive metabolic panel  Result Value Ref Range   Glucose 132 (H) 70 - 99 mg/dL   BUN 11 6 - 24 mg/dL   Creatinine, Ser 2.13 0.57 - 1.00 mg/dL   eGFR 086 >57 QI/ONG/2.95   BUN/Creatinine Ratio 19 9 - 23   Sodium 137 134 - 144 mmol/L   Potassium 4.0 3.5 - 5.2 mmol/L   Chloride 99 96 - 106 mmol/L   CO2 25 20 - 29 mmol/L   Calcium 9.3 8.7 - 10.2 mg/dL   Total Protein 6.5 6.0 - 8.5 g/dL   Albumin 4.3 3.9 - 4.9 g/dL   Globulin, Total 2.2 1.5 - 4.5 g/dL   Bilirubin Total 0.7 0.0 - 1.2 mg/dL   Alkaline Phosphatase 80 44 - 121 IU/L   AST 20 0 - 40 IU/L   ALT 16 0 - 32 IU/L  Lipid Panel w/o Chol/HDL Ratio  Result Value Ref Range   Cholesterol, Total 183 100 - 199 mg/dL   Triglycerides 284 (H) 0 - 149 mg/dL   HDL 31 (L) >13 mg/dL   VLDL Cholesterol Cal 46 (H) 5 - 40 mg/dL   LDL Chol Calc (NIH) 244 (H) 0 - 99 mg/dL  VITAMIN D 25 Hydroxy (Vit-D Deficiency, Fractures)  Result Value Ref Range   Vit D, 25-Hydroxy 28.7  (L) 30.0 - 100.0 ng/mL  Microalbumin, Urine Waived  Result Value Ref Range   Microalb, Ur Waived 10 0 - 19 mg/L   Creatinine, Urine Waived 10 10 - 300 mg/dL   Microalb/Creat Ratio 30-300 (H) <30 mg/g  Cytology - PAP  Result Value Ref Range   High risk HPV Negative    Adequacy      Satisfactory for evaluation; transformation zone component ABSENT.   Diagnosis      - Negative for Intraepithelial Lesions or Malignancy (NILM)   Diagnosis - Benign reactive/reparative changes    Comment Cellular changes consistent with hyperkeratosis.    Comment Normal Reference Range HPV - Negative       Assessment & Plan:   Problem List Items Addressed This Visit   None Visit Diagnoses     Acute non-recurrent frontal sinusitis    -  Primary   Will treat with amoxicillin.  Complete course of antibiotics. Continue with OTC symptom management. Recommend rest and hydration. FU if not improved.   Relevant Medications   amoxicillin (AMOXIL) 500 MG capsule   Sore throat       Relevant Orders   Rapid Strep screen([facility])        Follow up plan: Return if symptoms worsen or fail to improve.

## 2023-06-07 ENCOUNTER — Other Ambulatory Visit: Payer: Self-pay

## 2023-07-04 ENCOUNTER — Encounter: Payer: Self-pay | Admitting: Family Medicine

## 2023-07-04 ENCOUNTER — Other Ambulatory Visit: Payer: Self-pay

## 2023-07-04 DIAGNOSIS — G43719 Chronic migraine without aura, intractable, without status migrainosus: Secondary | ICD-10-CM | POA: Diagnosis not present

## 2023-07-04 DIAGNOSIS — J3489 Other specified disorders of nose and nasal sinuses: Secondary | ICD-10-CM | POA: Diagnosis not present

## 2023-07-04 DIAGNOSIS — G932 Benign intracranial hypertension: Secondary | ICD-10-CM | POA: Diagnosis not present

## 2023-07-04 DIAGNOSIS — R519 Headache, unspecified: Secondary | ICD-10-CM | POA: Diagnosis not present

## 2023-07-04 MED ORDER — FLUTICASONE PROPIONATE 50 MCG/ACT NA SUSP
2.0000 | Freq: Every day | NASAL | 5 refills | Status: DC
  Filled 2023-07-04: qty 16, 30d supply, fill #0
  Filled 2023-08-31: qty 16, 30d supply, fill #1
  Filled 2023-10-22: qty 16, 30d supply, fill #2

## 2023-07-04 MED ORDER — ETODOLAC 200 MG PO CAPS
200.0000 mg | ORAL_CAPSULE | Freq: Two times a day (BID) | ORAL | 0 refills | Status: AC | PRN
  Filled 2023-07-04 – 2023-09-27 (×2): qty 45, 23d supply, fill #0

## 2023-07-05 ENCOUNTER — Other Ambulatory Visit: Payer: Self-pay

## 2023-07-18 ENCOUNTER — Other Ambulatory Visit: Payer: Self-pay

## 2023-07-25 ENCOUNTER — Other Ambulatory Visit: Payer: Self-pay

## 2023-07-25 DIAGNOSIS — J321 Chronic frontal sinusitis: Secondary | ICD-10-CM | POA: Diagnosis not present

## 2023-07-25 DIAGNOSIS — R519 Headache, unspecified: Secondary | ICD-10-CM | POA: Diagnosis not present

## 2023-07-25 MED ORDER — PREDNISONE 10 MG PO TABS
ORAL_TABLET | ORAL | 0 refills | Status: AC
  Filled 2023-07-25: qty 21, 6d supply, fill #0

## 2023-07-25 MED ORDER — AMOXICILLIN-POT CLAVULANATE 875-125 MG PO TABS
1.0000 | ORAL_TABLET | Freq: Two times a day (BID) | ORAL | 0 refills | Status: DC
  Filled 2023-07-25: qty 20, 10d supply, fill #0

## 2023-07-27 ENCOUNTER — Encounter: Payer: Self-pay | Admitting: Family Medicine

## 2023-07-27 ENCOUNTER — Other Ambulatory Visit: Payer: Self-pay

## 2023-07-29 ENCOUNTER — Other Ambulatory Visit: Payer: Self-pay

## 2023-07-30 ENCOUNTER — Other Ambulatory Visit: Payer: Self-pay

## 2023-07-30 MED ORDER — NURTEC 75 MG PO TBDP
75.0000 mg | ORAL_TABLET | ORAL | 6 refills | Status: DC
  Filled 2023-07-30: qty 16, 30d supply, fill #0

## 2023-07-31 ENCOUNTER — Other Ambulatory Visit: Payer: Self-pay

## 2023-07-31 MED ORDER — NURTEC 75 MG PO TBDP
75.0000 mg | ORAL_TABLET | Freq: Every day | ORAL | 6 refills | Status: DC | PRN
  Filled 2024-04-16: qty 16, 30d supply, fill #0

## 2023-08-14 ENCOUNTER — Other Ambulatory Visit: Payer: Self-pay

## 2023-08-14 MED ORDER — PREDNISONE 10 MG PO TABS
ORAL_TABLET | ORAL | 0 refills | Status: AC
  Filled 2023-08-14: qty 21, 6d supply, fill #0

## 2023-08-14 MED ORDER — AMOXICILLIN-POT CLAVULANATE 875-125 MG PO TABS
1.0000 | ORAL_TABLET | Freq: Two times a day (BID) | ORAL | 0 refills | Status: DC
  Filled 2023-08-14: qty 20, 10d supply, fill #0

## 2023-08-31 DIAGNOSIS — J329 Chronic sinusitis, unspecified: Secondary | ICD-10-CM | POA: Diagnosis not present

## 2023-08-31 DIAGNOSIS — G501 Atypical facial pain: Secondary | ICD-10-CM | POA: Diagnosis not present

## 2023-08-31 DIAGNOSIS — J321 Chronic frontal sinusitis: Secondary | ICD-10-CM | POA: Diagnosis not present

## 2023-08-31 DIAGNOSIS — J324 Chronic pansinusitis: Secondary | ICD-10-CM | POA: Diagnosis not present

## 2023-08-31 DIAGNOSIS — K116 Mucocele of salivary gland: Secondary | ICD-10-CM | POA: Diagnosis not present

## 2023-09-05 ENCOUNTER — Other Ambulatory Visit: Payer: Self-pay

## 2023-09-05 MED ORDER — SULFAMETHOXAZOLE-TRIMETHOPRIM 800-160 MG PO TABS
1.0000 | ORAL_TABLET | Freq: Two times a day (BID) | ORAL | 0 refills | Status: DC
  Filled 2023-09-05: qty 20, 10d supply, fill #0

## 2023-09-11 ENCOUNTER — Other Ambulatory Visit: Payer: Self-pay | Admitting: Family Medicine

## 2023-09-11 ENCOUNTER — Other Ambulatory Visit: Payer: Self-pay

## 2023-09-13 ENCOUNTER — Other Ambulatory Visit: Payer: Self-pay

## 2023-09-13 MED FILL — Clotrimazole w/ Betamethasone Cream 1-0.05%: CUTANEOUS | 30 days supply | Qty: 30 | Fill #0 | Status: AC

## 2023-09-13 NOTE — Telephone Encounter (Signed)
Requested medication (s) are due for refill today: yes  Requested medication (s) are on the active medication list: yes  Last refill:  05/09/23  Future visit scheduled: yes  Notes to clinic:  Medication not assigned to a protocol, review manually.      Requested Prescriptions  Pending Prescriptions Disp Refills   clotrimazole-betamethasone (LOTRISONE) cream 30 g 0    Sig: Apply 1 Application topically daily.     Off-Protocol Failed - 09/11/2023  5:29 PM      Failed - Medication not assigned to a protocol, review manually.      Passed - Valid encounter within last 12 months    Recent Outpatient Visits           4 months ago Acute non-recurrent frontal sinusitis   [place] [facility] Larae Grooms, NP   4 months ago Type 2 diabetes mellitus with hyperglycemia, without long-term current use of insulin (HCC)   [place] [place], Megan P, DO   5 months ago It band syndrome, left   [place] [facility] [place], Megan P, DO   7 months ago Acute cystitis without hematuria   [place] [facility] Mecum, Erin E, PA-C   8 months ago Acute cystitis without hematuria   [place] [facility] Larae Grooms, NP       Future Appointments             In 1 month Laural Benes, Oralia Rud, DO [facility], [facility]

## 2023-09-14 ENCOUNTER — Encounter: Payer: Self-pay | Admitting: Nurse Practitioner

## 2023-09-14 ENCOUNTER — Other Ambulatory Visit: Payer: Self-pay

## 2023-09-14 ENCOUNTER — Ambulatory Visit (INDEPENDENT_AMBULATORY_CARE_PROVIDER_SITE_OTHER): Payer: 59 | Admitting: Nurse Practitioner

## 2023-09-14 VITALS — BP 109/75 | HR 77 | Temp 98.0°F | Ht 61.0 in | Wt 149.3 lb

## 2023-09-14 DIAGNOSIS — G43801 Other migraine, not intractable, with status migrainosus: Secondary | ICD-10-CM

## 2023-09-14 MED ORDER — ONDANSETRON HCL 8 MG PO TABS
8.0000 mg | ORAL_TABLET | Freq: Three times a day (TID) | ORAL | 1 refills | Status: DC | PRN
  Filled 2023-09-14: qty 20, 7d supply, fill #0
  Filled 2023-12-25: qty 2, 1d supply, fill #1
  Filled 2023-12-25: qty 18, 6d supply, fill #1

## 2023-09-14 MED ORDER — KETOROLAC TROMETHAMINE 60 MG/2ML IM SOLN
60.0000 mg | Freq: Once | INTRAMUSCULAR | Status: AC
Start: 2023-09-14 — End: 2023-09-14
  Administered 2023-09-14: 60 mg via INTRAMUSCULAR

## 2023-09-14 NOTE — Progress Notes (Signed)
BP 109/75 (BP Location: Left Arm, Patient Position: Sitting, Cuff Size: Large)   Pulse 77   Temp 98 F (36.7 C) (Oral)   Ht 5\' 1"  (1.549 m)   Wt 149 lb 4.8 oz (67.7 kg)   SpO2 100%   BMI 28.21 kg/m    Subjective:    Patient ID: Ashley Wiley, female    DOB: [birth year], 47 y.o.   MRN: [id number]  HPI: Ashley Wiley is a 47 y.o. female  Chief Complaint  Patient presents with   Migraine    Has been a problem consistently for 3 days, yesterday was the absolute worse. Alsot took a trip to ER, Has taken tylenol, baclofen and lodine. Lodine was taken at 11:30 last night    MIGRAINES Ongoing x 3 days.  Symptoms have been bad since Tuesday.  She see's Neurology.  Her sinuses are clogged and they think that is what is causing her headaches. She has taken augmentin and prednisone and when she finished those her pain was worse then she took another round of medications and same thing happened after 3-4 days of stopping medications her symptoms were back.  She is now on bactrim.  Nurtec, tylenol improves pain for 2-3 hours then worsens.  Duration: years Onset: sudden Severity: 10/10- last night was a 10- improved today She is going to see Dr. Fenton Malling at [facility] on Wednesday.    Relevant past medical, surgical, family and social history reviewed and updated as indicated. Interim medical history since our last visit reviewed. Allergies and medications reviewed and updated.  Review of Systems  Gastrointestinal:  Positive for nausea.  Neurological:  Positive for headaches.    Per HPI unless specifically indicated above     Objective:    BP 109/75 (BP Location: Left Arm, Patient Position: Sitting, Cuff Size: Large)   Pulse 77   Temp 98 F (36.7 C) (Oral)   Ht 5\' 1"  (1.549 m)   Wt 149 lb 4.8 oz (67.7 kg)   SpO2 100%   BMI 28.21 kg/m   Wt Readings from Last 3 Encounters:  09/14/23 149 lb 4.8 oz (67.7 kg)  05/14/23 153 lb 3.2 oz (69.5 kg)  05/09/23 153 lb 6.4 oz (69.6 kg)     Physical Exam Vitals and nursing note reviewed.  Constitutional:      General: She is not in acute distress.    Appearance: Normal appearance. She is normal weight. She is not ill-appearing, toxic-appearing or diaphoretic.  HENT:     Head: Normocephalic.     Right Ear: External ear normal.     Left Ear: External ear normal.     Nose: Nose normal.     Mouth/Throat:     Mouth: Mucous membranes are moist.     Pharynx: Oropharynx is clear.  Eyes:     General:        Right eye: No discharge.        Left eye: No discharge.     Extraocular Movements: Extraocular movements intact.     Conjunctiva/sclera: Conjunctivae normal.     Pupils: Pupils are equal, round, and reactive to light.  Cardiovascular:     Rate and Rhythm: Normal rate and regular rhythm.     Heart sounds: No murmur heard. Pulmonary:     Effort: Pulmonary effort is normal. No respiratory distress.     Breath sounds: Normal breath sounds. No wheezing or rales.  Musculoskeletal:     Cervical back: Normal range of motion  and neck supple.  Skin:    General: Skin is warm and dry.     Capillary Refill: Capillary refill takes less than 2 seconds.  Neurological:     General: No focal deficit present.     Mental Status: She is alert and oriented to person, place, and time. Mental status is at baseline.  Psychiatric:        Mood and Affect: Mood normal.        Behavior: Behavior normal.        Thought Content: Thought content normal.        Judgment: Judgment normal.     Results for orders placed or performed in visit on 07/27/23  HM DIABETES EYE EXAM  Result Value Ref Range   HM Diabetic Eye Exam No Retinopathy No Retinopathy      Assessment & Plan:   Problem List Items Addressed This Visit   None Visit Diagnoses     Other migraine with status migrainosus, not intractable    -  Primary   Ongoing concern.  Will see ENT at [facility] on Wednesday.  Will treat with Toradol in office and Zofran PRN for nausea.   Relevant  Medications   ketorolac (TORADOL) injection 60 mg        Follow up plan: No follow-ups on file.

## 2023-09-18 ENCOUNTER — Other Ambulatory Visit: Payer: Self-pay

## 2023-09-19 ENCOUNTER — Encounter (HOSPITAL_COMMUNITY): Payer: Self-pay

## 2023-09-19 ENCOUNTER — Other Ambulatory Visit: Payer: Self-pay

## 2023-09-19 DIAGNOSIS — G932 Benign intracranial hypertension: Secondary | ICD-10-CM | POA: Diagnosis not present

## 2023-09-19 DIAGNOSIS — G43719 Chronic migraine without aura, intractable, without status migrainosus: Secondary | ICD-10-CM | POA: Diagnosis not present

## 2023-09-19 DIAGNOSIS — J3489 Other specified disorders of nose and nasal sinuses: Secondary | ICD-10-CM | POA: Diagnosis not present

## 2023-09-19 MED ORDER — BOTOX 200 UNITS IJ SOLR
INTRAMUSCULAR | 3 refills | Status: DC
Start: 2023-09-19 — End: 2023-09-20
  Filled 2023-09-20: qty 1, 84d supply, fill #0

## 2023-09-19 MED ORDER — KETOROLAC TROMETHAMINE 10 MG PO TABS
10.0000 mg | ORAL_TABLET | Freq: Four times a day (QID) | ORAL | 0 refills | Status: DC | PRN
Start: 2023-09-19 — End: 2023-10-26
  Filled 2023-09-19: qty 10, 3d supply, fill #0

## 2023-09-19 MED ORDER — FUROSEMIDE 20 MG PO TABS
20.0000 mg | ORAL_TABLET | Freq: Two times a day (BID) | ORAL | 5 refills | Status: DC
  Filled 2023-09-19 – 2023-10-22 (×2): qty 60, 30d supply, fill #0

## 2023-09-19 NOTE — Progress Notes (Signed)
Pharmacy Patient Advocate Encounter   Received notification from Patient Pharmacy that prior authorization for Botox is required/requested.   Insurance verification completed.   The patient is insured through [facility] .   Per test claim: PA required; PA submitted to above mentioned insurance via CoverMyMeds Key/confirmation #/EOC [id number] Status is pending

## 2023-09-20 ENCOUNTER — Other Ambulatory Visit: Payer: Self-pay

## 2023-09-20 ENCOUNTER — Other Ambulatory Visit (HOSPITAL_COMMUNITY): Payer: Self-pay

## 2023-09-20 NOTE — Progress Notes (Signed)
Specialty Pharmacy Initial Fill Coordination Note  Ashley Wiley is a 47 y.o. female contacted today regarding refills of specialty medication(s) Onabotulinumtoxina   Patient requested Delivery   Delivery date: 09/25/23   Verified address: [facility]   Medication will be filled on 11/4.   Patient is aware of $0 copayment.

## 2023-09-20 NOTE — Progress Notes (Signed)
Pharmacy Patient Advocate Encounter  Received notification from [facility] that Prior Authorization for Botox has been APPROVED from 09/19/23 to 03/17/24   PA #/Case ID/Reference #:  [id number]

## 2023-09-21 ENCOUNTER — Other Ambulatory Visit: Payer: Self-pay

## 2023-09-21 MED ORDER — BOTOX 200 UNITS IJ SOLR
INTRAMUSCULAR | 3 refills | Status: DC
  Filled 2023-09-21: qty 1, 84d supply, fill #0
  Filled 2023-11-29: qty 1, 84d supply, fill #1
  Filled 2024-05-13: qty 1, 30d supply, fill #2
  Filled 2024-05-15 – 2024-08-05 (×2): qty 1, 84d supply, fill #2

## 2023-09-24 ENCOUNTER — Other Ambulatory Visit (HOSPITAL_COMMUNITY): Payer: Self-pay

## 2023-09-24 ENCOUNTER — Other Ambulatory Visit: Payer: Self-pay

## 2023-09-24 DIAGNOSIS — J342 Deviated nasal septum: Secondary | ICD-10-CM | POA: Diagnosis not present

## 2023-09-25 ENCOUNTER — Other Ambulatory Visit: Payer: Self-pay | Admitting: Neurology

## 2023-09-25 DIAGNOSIS — G932 Benign intracranial hypertension: Secondary | ICD-10-CM

## 2023-09-25 DIAGNOSIS — G43719 Chronic migraine without aura, intractable, without status migrainosus: Secondary | ICD-10-CM

## 2023-09-26 ENCOUNTER — Other Ambulatory Visit: Payer: Self-pay

## 2023-09-27 ENCOUNTER — Other Ambulatory Visit: Payer: Self-pay

## 2023-09-27 NOTE — Progress Notes (Signed)
Patient for DG Lumbar Puncture on Friday 09/28/23, I called and spoke with the patient on the phone and gave pre-procedure instructions. Pt was made aware to be here at 10:30a and check in at the new entrance. Pt stated understanding.  Called 09/25/2023

## 2023-09-28 ENCOUNTER — Ambulatory Visit
Admission: RE | Admit: 2023-09-28 | Discharge: 2023-09-28 | Disposition: A | Discharge status: Home / Self Care | Payer: 59 | Source: Ambulatory Visit | Attending: Neurology | Admitting: Neurology

## 2023-09-28 VITALS — BP 132/90 | HR 78 | Temp 98.0°F | Resp 18 | Ht 60.0 in | Wt 148.0 lb

## 2023-09-28 DIAGNOSIS — G43719 Chronic migraine without aura, intractable, without status migrainosus: Secondary | ICD-10-CM | POA: Diagnosis not present

## 2023-09-28 DIAGNOSIS — G932 Benign intracranial hypertension: Secondary | ICD-10-CM | POA: Insufficient documentation

## 2023-09-28 LAB — CSF CELL COUNT WITH DIFFERENTIAL
Eosinophils, CSF: 0 %
Lymphs, CSF: 93 %
Monocyte-Macrophage-Spinal Fluid: 7 %
RBC Count, CSF: 7 /mm3 — ABNORMAL HIGH (ref 0–3)
Segmented Neutrophils-CSF: 0 %
Tube #: 1
WBC, CSF: 50 /mm3 (ref 0–5)

## 2023-09-28 LAB — PATHOLOGIST SMEAR REVIEW

## 2023-09-28 LAB — PROTEIN AND GLUCOSE, CSF
Glucose, CSF: 55 mg/dL (ref 40–70)
Total  Protein, CSF: 24 mg/dL (ref 15–45)

## 2023-09-28 LAB — PREGNANCY, URINE: Preg Test, Ur: NEGATIVE

## 2023-09-28 MED ORDER — LIDOCAINE 1 % OPTIME INJ - NO CHARGE
10.0000 mL | Freq: Once | INTRAMUSCULAR | Status: AC
  Administered 2023-09-28: 5 mL via INTRADERMAL
  Filled 2023-09-28: qty 10

## 2023-09-28 NOTE — OR Nursing (Signed)
Pt discharged home. Instructed to come to emergency room if has sudden spike in temperature, or changes in neurological symptoms. Continue pain meds as ordered by [facility] clinic

## 2023-09-28 NOTE — OR Nursing (Signed)
Called [place] [facility] with the wbc results from the CSF-50. R. Daune Perch RN took value and will make sure it gets to the appropriate practitioner.

## 2023-09-28 NOTE — Procedures (Signed)
Interventional Radiology Procedure:   Indications: Chronic headaches  Procedure: Lumbar puncture with pressures  Findings: Opening pressure 32 cm.  Removed 14 ml of clear CSF.   Closing pressure 17 cm.  Complications: None     EBL: Minimal  Plan: Bedrest 2 hours  Piero Mustard R. Lowella Dandy, MD  Pager: [phone]

## 2023-09-28 NOTE — OR Nursing (Signed)
[facility] clinic returned call and discussed symptoms with patient.

## 2023-10-05 LAB — OLIGOCLONAL BANDS, CSF + SERM

## 2023-10-22 ENCOUNTER — Other Ambulatory Visit: Payer: Self-pay

## 2023-10-22 MED ORDER — ETODOLAC 200 MG PO CAPS
200.0000 mg | ORAL_CAPSULE | Freq: Two times a day (BID) | ORAL | 0 refills | Status: DC | PRN
  Filled 2023-10-22: qty 45, 23d supply, fill #0

## 2023-10-26 ENCOUNTER — Ambulatory Visit (INDEPENDENT_AMBULATORY_CARE_PROVIDER_SITE_OTHER): Payer: 59 | Admitting: Family Medicine

## 2023-10-26 VITALS — BP 114/72 | HR 86 | Ht 60.0 in | Wt 150.0 lb

## 2023-10-26 DIAGNOSIS — G932 Benign intracranial hypertension: Secondary | ICD-10-CM | POA: Diagnosis not present

## 2023-10-26 NOTE — Progress Notes (Unsigned)
BP 114/72   Pulse 86   Ht 5' (1.524 m)   Wt 150 lb (68 kg)   SpO2 98%   BMI 29.29 kg/m    Subjective:    Patient ID: Ashley Wiley, female    DOB: 1976/08/03, 47 y.o.   MRN: [id number]  HPI: Ashley Wiley is a 47 y.o. female  Chief Complaint  Patient presents with  . Headache   Head has not been doing well. Had another lumbar puncture and had more fluids drawn off. Still having a lot of headaches  Seeing them again 12/16. Still having a lot of swelling around her R eye. She notes that she has a lot of pain and swelling that is making her eye worse. She   Had 2 rounds of prednisone before and htat made her headaches worse and then made the intercranial pressure go up.   Headaches are not doing well. Having a lot of trouble focusing at work esp  Relevant past medical, surgical, family and social history reviewed and updated as indicated. Interim medical history since our last visit reviewed. Allergies and medications reviewed and updated.  Review of Systems  Constitutional: Negative.   Respiratory: Negative.    Cardiovascular: Negative.   Neurological:  Positive for facial asymmetry and headaches. Negative for dizziness, tremors, seizures, syncope, speech difficulty, weakness, light-headedness and numbness.  Psychiatric/Behavioral: Negative.      Per HPI unless specifically indicated above     Objective:    BP 114/72   Pulse 86   Ht 5' (1.524 m)   Wt 150 lb (68 kg)   SpO2 98%   BMI 29.29 kg/m   Wt Readings from Last 3 Encounters:  10/26/23 150 lb (68 kg)  09/28/23 148 lb (67.1 kg)  09/14/23 149 lb 4.8 oz (67.7 kg)    Physical Exam Vitals and nursing note reviewed.  Constitutional:      General: She is not in acute distress.    Appearance: Normal appearance. She is not ill-appearing, toxic-appearing or diaphoretic.  HENT:     Head: Normocephalic and atraumatic.     Right Ear: External ear normal.     Left Ear: External ear normal.     Nose: Nose  normal.     Mouth/Throat:     Mouth: Mucous membranes are moist.     Pharynx: Oropharynx is clear.  Eyes:     General: No scleral icterus.       Right eye: No discharge.        Left eye: No discharge.     Extraocular Movements: Extraocular movements intact.     Conjunctiva/sclera: Conjunctivae normal.     Pupils: Pupils are equal, round, and reactive to light.  Cardiovascular:     Rate and Rhythm: Normal rate and regular rhythm.     Pulses: Normal pulses.     Heart sounds: Normal heart sounds. No murmur heard.    No friction rub. No gallop.  Pulmonary:     Effort: Pulmonary effort is normal. No respiratory distress.     Breath sounds: Normal breath sounds. No stridor. No wheezing, rhonchi or rales.  Chest:     Chest wall: No tenderness.  Musculoskeletal:        General: Normal range of motion.     Cervical back: Normal range of motion and neck supple.  Skin:    General: Skin is warm and dry.     Capillary Refill: Capillary refill takes less than 2 seconds.  Coloration: Skin is not jaundiced or pale.     Findings: No bruising, erythema, lesion or rash.  Neurological:     General: No focal deficit present.     Mental Status: She is alert and oriented to person, place, and time. Mental status is at baseline.  Psychiatric:        Mood and Affect: Mood normal.        Behavior: Behavior normal.        Thought Content: Thought content normal.        Judgment: Judgment normal.    Results for orders placed or performed during the hospital encounter of 09/28/23  Pregnancy, urine  Result Value Ref Range   Preg Test, Ur NEGATIVE NEGATIVE  CSF cell count with differential  Result Value Ref Range   Tube # 1    Color, CSF COLORLESS COLORLESS   Appearance, CSF CLEAR (A) CLEAR   Supernatant CLEAR    RBC Count, CSF 7 (H) 0 - 3 /cu mm   WBC, CSF 50 (HH) 0 - 5 /cu mm   Segmented Neutrophils-CSF 0 %   Lymphs, CSF 93 %   Monocyte-Macrophage-Spinal Fluid 7 %   Eosinophils, CSF 0 %   Oligoclonal bands, CSF + serm  Result Value Ref Range   CSF Oligoclonal Bands Comment   Protein and glucose, CSF  Result Value Ref Range   Glucose, CSF 55 40 - 70 mg/dL   Total  Protein, CSF 24 15 - 45 mg/dL  Pathologist smear review  Result Value Ref Range   Path Review Cytospin of tube 1 CSF fluid is reviewed.       Assessment & Plan:   Problem List Items Addressed This Visit       Other   Intracranial pressure increased - Primary     Follow up plan: No follow-ups on file.

## 2023-10-29 ENCOUNTER — Telehealth: Payer: Self-pay

## 2023-10-29 ENCOUNTER — Encounter: Payer: Self-pay | Admitting: Family Medicine

## 2023-10-29 NOTE — Telephone Encounter (Signed)
Prior authorization was initiated via CoverMyMeds for Saxenda. Per patient's insurance cancelled PA stating "This drug/product is not covered under the pharmacy benefit. Prior Authorization is not available." Please advise?   KEY: [id number]

## 2023-10-29 NOTE — Assessment & Plan Note (Signed)
Continue to follow with neurology- due to see them in the next couple of weeks. Call with any concerns. Continue to monitor.

## 2023-10-29 NOTE — Telephone Encounter (Signed)
Ashley Wiley is not covered by her insurance and shouldn't have been prescribed. She's on ozempic for her diabetes, but is not on saxenda- where did this come from?

## 2023-11-05 DIAGNOSIS — Z8679 Personal history of other diseases of the circulatory system: Secondary | ICD-10-CM | POA: Diagnosis not present

## 2023-11-05 DIAGNOSIS — G4486 Cervicogenic headache: Secondary | ICD-10-CM | POA: Diagnosis not present

## 2023-11-05 DIAGNOSIS — Z9889 Other specified postprocedural states: Secondary | ICD-10-CM | POA: Diagnosis not present

## 2023-11-05 DIAGNOSIS — R519 Headache, unspecified: Secondary | ICD-10-CM | POA: Diagnosis not present

## 2023-11-05 DIAGNOSIS — J3489 Other specified disorders of nose and nasal sinuses: Secondary | ICD-10-CM | POA: Diagnosis not present

## 2023-11-06 ENCOUNTER — Encounter: Payer: Self-pay | Admitting: Neurology

## 2023-11-08 ENCOUNTER — Ambulatory Visit: Payer: 59 | Admitting: Family Medicine

## 2023-11-09 ENCOUNTER — Ambulatory Visit: Payer: 59 | Admitting: Family Medicine

## 2023-11-09 ENCOUNTER — Other Ambulatory Visit: Payer: Self-pay

## 2023-11-09 ENCOUNTER — Encounter: Payer: Self-pay | Admitting: Family Medicine

## 2023-11-09 VITALS — BP 117/77 | HR 83 | Ht 60.0 in | Wt 148.0 lb

## 2023-11-09 DIAGNOSIS — F332 Major depressive disorder, recurrent severe without psychotic features: Secondary | ICD-10-CM

## 2023-11-09 DIAGNOSIS — E782 Mixed hyperlipidemia: Secondary | ICD-10-CM

## 2023-11-09 DIAGNOSIS — E1165 Type 2 diabetes mellitus with hyperglycemia: Secondary | ICD-10-CM | POA: Diagnosis not present

## 2023-11-09 DIAGNOSIS — Z1231 Encounter for screening mammogram for malignant neoplasm of breast: Secondary | ICD-10-CM

## 2023-11-09 DIAGNOSIS — G43009 Migraine without aura, not intractable, without status migrainosus: Secondary | ICD-10-CM | POA: Diagnosis not present

## 2023-11-09 DIAGNOSIS — F419 Anxiety disorder, unspecified: Secondary | ICD-10-CM

## 2023-11-09 DIAGNOSIS — E559 Vitamin D deficiency, unspecified: Secondary | ICD-10-CM | POA: Diagnosis not present

## 2023-11-09 DIAGNOSIS — Z0000 Encounter for general adult medical examination without abnormal findings: Secondary | ICD-10-CM | POA: Diagnosis not present

## 2023-11-09 DIAGNOSIS — Z7985 Long-term (current) use of injectable non-insulin antidiabetic drugs: Secondary | ICD-10-CM

## 2023-11-09 LAB — MICROALBUMIN, URINE WAIVED
Creatinine, Urine Waived: 10 mg/dL (ref 10–300)
Microalb, Ur Waived: 10 mg/L (ref 0–19)

## 2023-11-09 LAB — BAYER DCA HB A1C WAIVED: HB A1C (BAYER DCA - WAIVED): 5.6 % (ref 4.8–5.6)

## 2023-11-09 MED ORDER — FLUTICASONE PROPIONATE 50 MCG/ACT NA SUSP
2.0000 | Freq: Every day | NASAL | 5 refills | Status: DC
  Filled 2023-11-09 – 2024-04-16 (×2): qty 16, 30d supply, fill #0
  Filled 2024-09-18: qty 16, 30d supply, fill #1
  Filled 2024-11-07: qty 16, 30d supply, fill #2

## 2023-11-09 MED ORDER — METFORMIN HCL ER 500 MG PO TB24
500.0000 mg | ORAL_TABLET | Freq: Every day | ORAL | 1 refills | Status: DC
  Filled 2023-11-09 – 2024-05-07 (×2): qty 90, 90d supply, fill #0

## 2023-11-09 MED ORDER — OMEPRAZOLE 20 MG PO CPDR
20.0000 mg | DELAYED_RELEASE_CAPSULE | Freq: Every day | ORAL | 3 refills | Status: DC
  Filled 2023-11-09 – 2023-12-13 (×3): qty 30, 30d supply, fill #0

## 2023-11-09 MED ORDER — FUROSEMIDE 20 MG PO TABS
20.0000 mg | ORAL_TABLET | Freq: Every day | ORAL | 3 refills | Status: DC
  Filled 2023-11-09: qty 30, 30d supply, fill #0

## 2023-11-09 MED ORDER — UBRELVY 100 MG PO TABS
100.0000 mg | ORAL_TABLET | Freq: Every day | ORAL | 12 refills | Status: DC | PRN
  Filled 2023-11-09: qty 9, 15d supply, fill #0
  Filled 2023-12-11: qty 9, 9d supply, fill #0
  Filled 2023-12-13: qty 9, 30d supply, fill #0
  Filled 2024-09-18: qty 9, 30d supply, fill #1

## 2023-11-09 MED ORDER — SEMAGLUTIDE (2 MG/DOSE) 8 MG/3ML ~~LOC~~ SOPN
2.0000 mg | PEN_INJECTOR | SUBCUTANEOUS | 1 refills | Status: DC
  Filled 2023-11-09: qty 3, 28d supply, fill #0
  Filled 2023-12-11 – 2023-12-13 (×2): qty 9, 84d supply, fill #0
  Filled 2024-04-16: qty 3, 28d supply, fill #1

## 2023-11-09 NOTE — Assessment & Plan Note (Signed)
Under good control on current regimen. Continue current regimen. Continue to monitor. Call with any concerns. Refills given. Labs drawn today.   

## 2023-11-09 NOTE — Assessment & Plan Note (Signed)
Doing great with A1c of 5.6. Continue current regimen. Call with any concerns. Continue to monitor.

## 2023-11-09 NOTE — Patient Instructions (Signed)
[place] °Black Cohash °Evening Primrose Oil °

## 2023-11-09 NOTE — Assessment & Plan Note (Signed)
Under good control on current regimen. Continue current regimen. Continue to monitor. Call with any concerns. Refills given.   

## 2023-11-09 NOTE — Assessment & Plan Note (Signed)
Continue to follow with neurology. Due for 2nd opinion. Will try changing nurtec to ubrelvy to see if it helps more. Call with any concerns.

## 2023-11-09 NOTE — Progress Notes (Signed)
BP 117/77   Pulse 83   Ht 5' (1.524 m)   Wt 148 lb (67.1 kg)   SpO2 99%   BMI 28.90 kg/m    Subjective:    Patient ID: Ashley Wiley, female    DOB: 1976/09/21, 47 y.o.   MRN: [id number]  HPI: Ashley Wiley is a 47 y.o. female presenting on 11/09/2023 for comprehensive medical examination. Current medical complaints include:  MENOPAUSAL SYMPTOMS Duration: exacerbated Symptom severity: mild Hot flashes: yes Night sweats: yes Sleep disturbances: yes Vaginal dryness: yes Dyspareunia:no Decreased libido: no Emotional lability: yes Stress incontinence: no Previous HRT/pharmacotherapy: no Hysterectomy: yes Average interval between menses: last period was 5 months after prior  DIABETES Hypoglycemic episodes:no Polydipsia/polyuria: no Visual disturbance: no Chest pain: no Paresthesias: no Glucose Monitoring: no Taking Insulin?: no Blood Pressure Monitoring: not checking Retinal Examination: Up to Date Foot Exam: Up to Date Diabetic Education: Completed Pneumovax: Up to Date Influenza: Up to Date Aspirin: no  Head has still not been doing well. Following with neurology. Has been very frustrated.   She currently lives with:husband and kids Menopausal Symptoms: yes  Depression Screen done today and results listed below:     11/09/2023    3:03 PM 09/14/2023    9:14 AM 05/09/2023    9:07 AM 05/09/2023    8:43 AM 11/01/2022    8:45 AM  Depression screen PHQ 2/9  Decreased Interest 0 0 0 0 0  Down, Depressed, Hopeless 1 0 1 0 0  PHQ - 2 Score 1 0 1 0 0  Altered sleeping 1 1 3  0 0  Tired, decreased energy 0 1 0 0 0  Change in appetite 0 0 0 0 0  Feeling bad or failure about yourself  0 0 0 0 0  Trouble concentrating 1 1 0 0 0  Moving slowly or fidgety/restless 0 0 0 0 0  Suicidal thoughts 0 0 0 0 0  PHQ-9 Score 3 3 4  0 0  Difficult doing work/chores Somewhat difficult  Not difficult at all Not difficult at all Not difficult at all    Past Medical  History:  Past Medical History:  Diagnosis Date   Allergy    Aneurysm (HCC)    Anxiety    Back pain    Constipation    Depression    Dermatofibroma    Diabetes mellitus without complication (HCC)    Takes Metformin  dx 2016   Facial pain    Facial pain    GERD (gastroesophageal reflux disease)    Headache    Hyperlipidemia    IFG (impaired fasting glucose)    Low HDL (under 40)    Migraine    Obesity    Overweight    Patella-femoral syndrome    Pinguecula    Prediabetes    Thyroid nodule april 2016   Vitamin B12 deficiency    Vitamin D deficiency disease     Surgical History:  Past Surgical History:  Procedure Laterality Date   CARPAL TUNNEL RELEASE     COLONOSCOPY WITH PROPOFOL N/A 04/11/2023   Procedure: COLONOSCOPY WITH PROPOFOL;  Surgeon: Toney Reil, MD;  Location: [facility] END[facility]COPY;  Service: Gastroenterology;  Laterality: N/A;   GANGLION CYST EXCISION Left    IR 3D INDEPENDENT WKST  12/04/2017   IR ANGIO INTRA EXTRACRAN SEL INTERNAL CAROTID BILAT MOD SED  12/04/2017   IR ANGIO INTRA EXTRACRAN SEL INTERNAL CAROTID BILAT MOD SED  05/17/2021   IR ANGIO INTRA  EXTRACRAN SEL INTERNAL CAROTID UNI L MOD SED  03/05/2018   IR ANGIO INTRA EXTRACRAN SEL INTERNAL CAROTID UNI L MOD SED  05/02/2019   IR ANGIO VERTEBRAL SEL VERTEBRAL BILAT MOD SED  12/04/2017   IR ANGIO VERTEBRAL SEL VERTEBRAL UNI R MOD SED  05/17/2021   IR ANGIOGRAM FOLLOW UP STUDY  03/05/2018   IR ANGIOGRAM FOLLOW UP STUDY  03/05/2018   IR ANGIOGRAM FOLLOW UP STUDY  03/05/2018   IR NEURO EACH ADD'L AFTER BASIC UNI LEFT (MS)  03/05/2018   IR TRANSCATH/EMBOLIZ  03/05/2018   IR US GUIDE VASC ACCESS RIGHT  05/17/2021   NASAL SINUS SURGERY  Nov 2015   RADIOLOGY WITH ANESTHESIA N/A 03/05/2018   Procedure: Arteriogram, coil embolization, possible stenting;  Surgeon: Lisbeth Renshaw, MD;  Location: [facility] OR;  Service: Radiology;  Laterality: N/A;   TUBAL LIGATION      Medications:  Current Outpatient Medications on  File Prior to Visit  Medication Sig   baclofen (LIORESAL) 10 MG tablet Take 1 tablet (10 mg total) by mouth at bedtime as needed for muscle spasms.   botulinum toxin Type A (BOTOX) 200 units injection MD to inject 200 units in facial and neck muscles every 3 months   Cholecalciferol (VITAMIN D) 125 MCG (5000 UT) CAPS    clotrimazole-betamethasone (LOTRISONE) cream Apply 1 Application topically daily.   ergocalciferol (VITAMIN D2) 1.25 MG (50000 UT) capsule Take 1 capsule (50,000 Units total) by mouth once a week.   etodolac (LODINE) 200 MG capsule Take 1 capsule (200 mg total) by mouth 2 (two) times daily as needed.   etodolac (LODINE) 200 MG capsule Take 1 capsule (200 mg total) by mouth 2 (two) times daily as needed.   fluticasone (FLONASE) 50 MCG/ACT nasal spray Place 2 sprays into both nostrils daily.   furosemide (LASIX) 20 MG tablet Take 1 tablet (20 mg total) by mouth 2 (two) times daily.   glucose blood (FREESTYLE LITE) test strip Use to check blood sugar twice daily   loratadine (CLARITIN) 10 MG tablet Take 10 mg by mouth daily as needed for allergies.    ondansetron (ZOFRAN) 8 MG tablet Take 1 tablet (8 mg total) by mouth every 8 (eight) hours as needed for nausea or vomiting.   Rimegepant Sulfate (NURTEC) 75 MG TBDP Take 1 tablet (75 mg total) by mouth as directed.   Rimegepant Sulfate (NURTEC) 75 MG TBDP Take 1 tablet (75 mg total) by mouth daily as needed for migraine. (No more than 75mg  in a 24 hour period).   Tavaborole (KERYDIN) 5 % SOLN Apply to affected toenail nightly   No current facility-administered medications on file prior to visit.    Allergies:  Allergies  Allergen Reactions   Imitrex [Sumatriptan] Other (See Comments)    Chest pain    Wellbutrin [Bupropion] Other (See Comments)    Increased anxiety and tremors    Social History:  Social History   Socioeconomic History   Marital status: Married    Spouse name: Not on file   Number of children: Not on  file   Years of education: Not on file   Highest education level: Associate degree: academic program  Occupational History   Not on file  Tobacco Use   Smoking status: Never   Smokeless tobacco: Never  Vaping Use   Vaping status: Never Used  Substance and Sexual Activity   Alcohol use: Not Currently    Alcohol/week: 0.0 - 1.0 standard drinks of alcohol  Comment: Drinks 3-4 times per year   Drug use: No   Sexual activity: Yes    Birth control/protection: None  Other Topics Concern   Not on file  Social History Narrative   Not on file   Social Drivers of Health   Financial Resource Strain: Low Risk  (11/08/2023)   Overall Financial Resource Strain (CARDIA)    Difficulty of Paying Living Expenses: Not hard at all  Food Insecurity: No Food Insecurity (11/08/2023)   Hunger Vital Sign    Worried About Running Out of Food in the Last Year: Never true    Ran Out of Food in the Last Year: Never true  Transportation Needs: No Transportation Needs (11/08/2023)   PRAPARE - Administrator, Civil Service (Medical): No    Lack of Transportation (Non-Medical): No  Physical Activity: Sufficiently Active (11/08/2023)   Exercise Vital Sign    Days of Exercise per Week: 6 days    Minutes of Exercise per Session: 30 min  Stress: No Stress Concern Present (11/08/2023)   [employer] of Occupational Health - Occupational Stress Questionnaire    Feeling of Stress : Only a little  Social Connections: Socially Integrated (11/08/2023)   Social Connection and Isolation Panel [NHANES]    Frequency of Communication with Friends and Family: More than three times a week    Frequency of Social Gatherings with Friends and Family: Three times a week    Attends Religious Services: More than 4 times per year    Active Member of Clubs or Organizations: No    Attends Banker Meetings: 1 to 4 times per year    Marital Status: Married  Catering manager Violence: Not At Risk  (02/07/2018)   Humiliation, Afraid, Rape, and Kick questionnaire    Fear of Current or Ex-Partner: No    Emotionally Abused: No    Physically Abused: No    Sexually Abused: No   Social History   Tobacco Use  Smoking Status Never  Smokeless Tobacco Never   Social History   Substance and Sexual Activity  Alcohol Use Not Currently   Alcohol/week: 0.0 - 1.0 standard drinks of alcohol   Comment: Drinks 3-4 times per year    Family History:  Family History  Problem Relation Age of Onset   Depression Mother    Anxiety disorder Mother    Heart attack Father 70   Heart failure Father    Diabetes Father    Hypertension Father    Heart disease Father    Kidney disease Father    Stroke Father    Aneurysm Father    Hyperlipidemia Father    Colon cancer Brother    Diabetes Paternal Grandfather    Sudden death Neg Hx     Past medical history, surgical history, medications, allergies, family history and social history reviewed with patient today and changes made to appropriate areas of the chart.   Review of Systems  Constitutional:  Positive for diaphoresis. Negative for chills, fever, malaise/fatigue and weight loss.  HENT:  Positive for ear pain. Negative for congestion, ear discharge, hearing loss, nosebleeds, sinus pain, sore throat and tinnitus.   Eyes:  Positive for blurred vision and pain. Negative for double vision, photophobia, discharge and redness.  Respiratory: Negative.  Negative for stridor.   Cardiovascular:  Positive for leg swelling (very rarely). Negative for chest pain, palpitations, orthopnea, claudication and PND.  Gastrointestinal:  Positive for constipation, diarrhea and heartburn. Negative for abdominal pain, blood  in stool, melena, nausea and vomiting.  Genitourinary: Negative.   Musculoskeletal: Negative.   Skin: Negative.   Neurological:  Positive for dizziness and headaches. Negative for tingling, tremors, sensory change, speech change, focal weakness,  seizures, loss of consciousness and weakness.  Endo/Heme/Allergies:  Positive for environmental allergies. Negative for polydipsia. Does not bruise/bleed easily.  Psychiatric/Behavioral:  Negative for depression, hallucinations, memory loss, substance abuse and suicidal ideas. The patient is nervous/anxious. The patient does not have insomnia.    All other ROS negative except what is listed above and in the HPI.      Objective:    BP 117/77   Pulse 83   Ht 5' (1.524 m)   Wt 148 lb (67.1 kg)   SpO2 99%   BMI 28.90 kg/m   Wt Readings from Last 3 Encounters:  11/09/23 148 lb (67.1 kg)  10/26/23 150 lb (68 kg)  09/28/23 148 lb (67.1 kg)    Physical Exam Vitals and nursing note reviewed.  Constitutional:      General: She is not in acute distress.    Appearance: Normal appearance. She is not ill-appearing, toxic-appearing or diaphoretic.  HENT:     Head: Normocephalic and atraumatic.     Right Ear: Tympanic membrane, ear canal and external ear normal. There is no impacted cerumen.     Left Ear: Tympanic membrane, ear canal and external ear normal. There is no impacted cerumen.     Nose: Nose normal. No congestion or rhinorrhea.     Mouth/Throat:     Mouth: Mucous membranes are moist.     Pharynx: Oropharynx is clear. No oropharyngeal exudate or posterior oropharyngeal erythema.  Eyes:     General: No scleral icterus.       Right eye: No discharge.        Left eye: No discharge.     Extraocular Movements: Extraocular movements intact.     Conjunctiva/sclera: Conjunctivae normal.     Pupils: Pupils are equal, round, and reactive to light.  Neck:     Vascular: No carotid bruit.  Cardiovascular:     Rate and Rhythm: Normal rate and regular rhythm.     Pulses: Normal pulses.     Heart sounds: No murmur heard.    No friction rub. No gallop.  Pulmonary:     Effort: Pulmonary effort is normal. No respiratory distress.     Breath sounds: Normal breath sounds. No stridor. No  wheezing, rhonchi or rales.  Chest:     Chest wall: No tenderness.  Abdominal:     General: Abdomen is flat. Bowel sounds are normal. There is no distension.     Palpations: Abdomen is soft. There is no mass.     Tenderness: There is no abdominal tenderness. There is no right CVA tenderness, left CVA tenderness, guarding or rebound.     Hernia: No hernia is present.  Genitourinary:    Comments: Breast and pelvic exams deferred with shared decision making Musculoskeletal:        General: No swelling, tenderness, deformity or signs of injury.     Cervical back: Normal range of motion and neck supple. No rigidity. No muscular tenderness.     Right lower leg: No edema.     Left lower leg: No edema.  Lymphadenopathy:     Cervical: No cervical adenopathy.  Skin:    General: Skin is warm and dry.     Capillary Refill: Capillary refill takes less than 2 seconds.  Coloration: Skin is not jaundiced or pale.     Findings: No bruising, erythema, lesion or rash.  Neurological:     General: No focal deficit present.     Mental Status: She is alert and oriented to person, place, and time. Mental status is at baseline.     Cranial Nerves: No cranial nerve deficit.     Sensory: No sensory deficit.     Motor: No weakness.     Coordination: Coordination normal.     Gait: Gait normal.     Deep Tendon Reflexes: Reflexes normal.  Psychiatric:        Mood and Affect: Mood normal.        Behavior: Behavior normal.        Thought Content: Thought content normal.        Judgment: Judgment normal.     Results for orders placed or performed in visit on 11/09/23  Bayer DCA Hb A1c Waived   Collection Time: 11/09/23  3:05 PM  Result Value Ref Range   HB A1C (BAYER DCA - WAIVED) 5.6 4.8 - 5.6 %  Microalbumin, Urine Waived   Collection Time: 11/09/23  3:05 PM  Result Value Ref Range   Microalb, Ur Waived 10 0 - 19 mg/L   Creatinine, Urine Waived 10 10 - 300 mg/dL   Microalb/Creat Ratio 30-300 (H)  <30 mg/g      Assessment & Plan:   Problem List Items Addressed This Visit       Cardiovascular and Mediastinum   Migraine without aura and without status migrainosus, not intractable   Continue to follow with neurology. Due for 2nd opinion. Will try changing nurtec to ubrelvy to see if it helps more. Call with any concerns.       Relevant Medications   furosemide (LASIX) 20 MG tablet   Ubrogepant (UBRELVY) 100 MG TABS     Endocrine   Type 2 diabetes mellitus with hyperglycemia, without long-term current use of insulin (HCC)   Doing great with A1c of 5.6. Continue current regimen. Call with any concerns. Continue to monitor.       Relevant Medications   metFORMIN (GLUCOPHAGE-XR) 500 MG 24 hr tablet   Semaglutide, 2 MG/DOSE, 8 MG/3ML SOPN   Other Relevant Orders   CBC with Differential/Platelet   Comprehensive metabolic panel   Bayer DCA Hb V9D Waived (Completed)   Microalbumin, Urine Waived (Completed)     Other   Hyperlipidemia   Under good control on current regimen. Continue current regimen. Continue to monitor. Call with any concerns. Refills given. Labs drawn today.       Relevant Medications   furosemide (LASIX) 20 MG tablet   Other Relevant Orders   CBC with Differential/Platelet   Comprehensive metabolic panel   Lipid Panel w/o Chol/HDL Ratio   Vitamin D deficiency disease   Rechecking labs today. Await results. Treat as needed.       Relevant Orders   VITAMIN D 25 Hydroxy (Vit-D Deficiency, Fractures)   Depression   Under good control on current regimen. Continue current regimen. Continue to monitor. Call with any concerns. Refills given.       Relevant Orders   CBC with Differential/Platelet   Comprehensive metabolic panel   TSH   Acute anxiety   Under good control on current regimen. Continue current regimen. Continue to monitor. Call with any concerns. Refills given.        Other Visit Diagnoses       Routine general  medical examination at a  health care facility    -  Primary   Vaccines up to date. Screening labs checked today. Pap, mammo and colonoscopy up to date. Continue diet and exercise. Call with any concerns.   Relevant Orders   CBC with Differential/Platelet   Comprehensive metabolic panel   Lipid Panel w/o Chol/HDL Ratio   TSH   Bayer DCA Hb A1c Waived (Completed)     Encounter for screening mammogram for malignant neoplasm of breast       Mammo ordered today.   Relevant Orders   MM 3D SCREENING MAMMOGRAM BILATERAL BREAST        Follow up plan: Return in about 6 months (around 05/09/2024).   LABORATORY TESTING:  - Pap smear: not applicable  IMMUNIZATIONS:   - Tdap: Tetanus vaccination status reviewed: last tetanus booster within 10 years. - Influenza: Up to date - Pneumovax: Up to date - Prevnar: Not applicable - COVID: Up to date - HPV: Not applicable - Shingrix vaccine: Not applicable  SCREENING: -Mammogram: Up to date  - Colonoscopy: Up to date   PATIENT COUNSELING:   Advised to take 1 mg of folate supplement per day if capable of pregnancy.   Sexuality: Discussed sexually transmitted diseases, partner selection, use of condoms, avoidance of unintended pregnancy  and contraceptive alternatives.   Advised to avoid cigarette smoking.  I discussed with the patient that most people either abstain from alcohol or drink within safe limits (<=14/week and <=4 drinks/occasion for males, <=7/weeks and <= 3 drinks/occasion for females) and that the risk for alcohol disorders and other health effects rises proportionally with the number of drinks per week and how often a drinker exceeds daily limits.  Discussed cessation/primary prevention of drug use and availability of treatment for abuse.   Diet: Encouraged to adjust caloric intake to maintain  or achieve ideal body weight, to reduce intake of dietary saturated fat and total fat, to limit sodium intake by avoiding high sodium foods and not adding table  salt, and to maintain adequate dietary potassium and calcium preferably from fresh fruits, vegetables, and low-fat dairy products.    stressed the importance of regular exercise  Injury prevention: Discussed safety belts, safety helmets, smoke detector, smoking near bedding or upholstery.   Dental health: Discussed importance of regular tooth brushing, flossing, and dental visits.    NEXT PREVENTATIVE PHYSICAL DUE IN 1 YEAR. Return in about 6 months (around 05/09/2024).

## 2023-11-09 NOTE — Assessment & Plan Note (Signed)
Rechecking labs today. Await results. Treat as needed.  °

## 2023-11-10 LAB — COMPREHENSIVE METABOLIC PANEL
ALT: 19 [IU]/L (ref 0–32)
AST: 19 [IU]/L (ref 0–40)
Albumin: 4.6 g/dL (ref 3.9–4.9)
Alkaline Phosphatase: 87 [IU]/L (ref 44–121)
BUN/Creatinine Ratio: 25 — ABNORMAL HIGH (ref 9–23)
BUN: 15 mg/dL (ref 6–24)
Bilirubin Total: 1 mg/dL (ref 0.0–1.2)
CO2: 24 mmol/L (ref 20–29)
Calcium: 9.5 mg/dL (ref 8.7–10.2)
Chloride: 100 mmol/L (ref 96–106)
Creatinine, Ser: 0.59 mg/dL (ref 0.57–1.00)
Globulin, Total: 2.2 g/dL (ref 1.5–4.5)
Glucose: 102 mg/dL — ABNORMAL HIGH (ref 70–99)
Potassium: 4 mmol/L (ref 3.5–5.2)
Sodium: 138 mmol/L (ref 134–144)
Total Protein: 6.8 g/dL (ref 6.0–8.5)
eGFR: 112 mL/min/{1.73_m2} (ref >59)

## 2023-11-10 LAB — CBC WITH DIFFERENTIAL/PLATELET
Basophils Absolute: 0 10*3/uL (ref 0.0–0.2)
Basos: 0 %
EOS (ABSOLUTE): 0.2 10*3/uL (ref 0.0–0.4)
Eos: 3 %
Hematocrit: 39.2 % (ref 34.0–46.6)
Hemoglobin: 12.9 g/dL (ref 11.1–15.9)
Immature Grans (Abs): 0 10*3/uL (ref 0.0–0.1)
Immature Granulocytes: 0 %
Lymphocytes Absolute: 2.5 10*3/uL (ref 0.7–3.1)
Lymphs: 37 %
MCH: 30.3 pg (ref 26.6–33.0)
MCHC: 32.9 g/dL (ref 31.5–35.7)
MCV: 92 fL (ref 79–97)
Monocytes Absolute: 0.5 10*3/uL (ref 0.1–0.9)
Monocytes: 7 %
Neutrophils Absolute: 3.7 10*3/uL (ref 1.4–7.0)
Neutrophils: 53 %
Platelets: 168 10*3/uL (ref 150–450)
RBC: 4.26 x10E6/uL (ref 3.77–5.28)
RDW: 12.9 % (ref 11.7–15.4)
WBC: 6.9 10*3/uL (ref 3.4–10.8)

## 2023-11-10 LAB — LIPID PANEL W/O CHOL/HDL RATIO
Cholesterol, Total: 225 mg/dL — ABNORMAL HIGH (ref 100–199)
HDL: 34 mg/dL — ABNORMAL LOW (ref >39)
LDL Chol Calc (NIH): 141 mg/dL — ABNORMAL HIGH (ref 0–99)
Triglycerides: 274 mg/dL — ABNORMAL HIGH (ref 0–149)
VLDL Cholesterol Cal: 50 mg/dL — ABNORMAL HIGH (ref 5–40)

## 2023-11-10 LAB — TSH: TSH: 1.15 u[IU]/mL (ref 0.450–4.500)

## 2023-11-10 LAB — VITAMIN D 25 HYDROXY (VIT D DEFICIENCY, FRACTURES): Vit D, 25-Hydroxy: 22.8 ng/mL — ABNORMAL LOW (ref 30.0–100.0)

## 2023-11-11 ENCOUNTER — Other Ambulatory Visit: Payer: Self-pay

## 2023-11-15 ENCOUNTER — Other Ambulatory Visit: Payer: Self-pay

## 2023-11-23 ENCOUNTER — Other Ambulatory Visit (HOSPITAL_COMMUNITY): Payer: Self-pay

## 2023-11-27 ENCOUNTER — Other Ambulatory Visit: Payer: Self-pay

## 2023-11-28 ENCOUNTER — Other Ambulatory Visit: Payer: Self-pay

## 2023-11-29 ENCOUNTER — Other Ambulatory Visit (HOSPITAL_COMMUNITY): Payer: Self-pay

## 2023-11-29 NOTE — Progress Notes (Signed)
 Specialty Pharmacy Refill Coordination Note  Ashley Wiley is a 48 y.o. female contacted today regarding refills of specialty medication(s) OnabotulinumtoxinA  (Botox )   Patient requested Courier to Provider Office   Delivery date: 12/20/23   Verified address: [facility] [address] [place], [place], [place]   Medication will be filled on 12/19/23.

## 2023-12-11 ENCOUNTER — Other Ambulatory Visit: Payer: Self-pay

## 2023-12-11 ENCOUNTER — Other Ambulatory Visit (HOSPITAL_COMMUNITY): Payer: Self-pay

## 2023-12-13 ENCOUNTER — Other Ambulatory Visit: Payer: Self-pay

## 2023-12-14 ENCOUNTER — Other Ambulatory Visit (HOSPITAL_COMMUNITY): Payer: Self-pay

## 2023-12-14 ENCOUNTER — Other Ambulatory Visit: Payer: Self-pay

## 2023-12-19 ENCOUNTER — Other Ambulatory Visit: Payer: Self-pay

## 2023-12-20 ENCOUNTER — Other Ambulatory Visit: Payer: Self-pay

## 2023-12-21 ENCOUNTER — Other Ambulatory Visit: Payer: Self-pay

## 2023-12-24 ENCOUNTER — Other Ambulatory Visit: Payer: Self-pay

## 2023-12-25 ENCOUNTER — Other Ambulatory Visit: Payer: Self-pay

## 2024-01-18 DIAGNOSIS — G932 Benign intracranial hypertension: Secondary | ICD-10-CM | POA: Diagnosis not present

## 2024-01-25 DIAGNOSIS — G932 Benign intracranial hypertension: Secondary | ICD-10-CM | POA: Diagnosis not present

## 2024-01-25 DIAGNOSIS — E119 Type 2 diabetes mellitus without complications: Secondary | ICD-10-CM | POA: Diagnosis not present

## 2024-02-12 ENCOUNTER — Other Ambulatory Visit: Payer: Self-pay

## 2024-02-15 ENCOUNTER — Ambulatory Visit
Admission: RE | Admit: 2024-02-15 | Discharge: 2024-02-15 | Disposition: A | Discharge status: Home / Self Care | Source: Ambulatory Visit | Attending: Family Medicine | Admitting: Family Medicine

## 2024-02-15 DIAGNOSIS — Z1231 Encounter for screening mammogram for malignant neoplasm of breast: Secondary | ICD-10-CM | POA: Diagnosis not present

## 2024-02-19 ENCOUNTER — Encounter: Payer: Self-pay | Admitting: Family Medicine

## 2024-02-22 DIAGNOSIS — R519 Headache, unspecified: Secondary | ICD-10-CM | POA: Diagnosis not present

## 2024-02-26 ENCOUNTER — Other Ambulatory Visit: Payer: Self-pay

## 2024-02-27 ENCOUNTER — Other Ambulatory Visit: Payer: Self-pay

## 2024-02-29 ENCOUNTER — Other Ambulatory Visit: Payer: Self-pay

## 2024-03-04 ENCOUNTER — Ambulatory Visit: Payer: 59 | Admitting: Neurology

## 2024-04-01 DIAGNOSIS — G932 Benign intracranial hypertension: Secondary | ICD-10-CM | POA: Diagnosis not present

## 2024-04-01 DIAGNOSIS — G43719 Chronic migraine without aura, intractable, without status migrainosus: Secondary | ICD-10-CM | POA: Diagnosis not present

## 2024-04-01 DIAGNOSIS — R519 Headache, unspecified: Secondary | ICD-10-CM | POA: Diagnosis not present

## 2024-04-08 NOTE — Progress Notes (Addendum)
 NEUROLOGY CONSULTATION NOTE  [name] MRN: [id number] DOB: December 08, 1975  Referring provider: Terre Ferri, DO Primary care provider: Terre Ferri, DO  Reason for consult:  second opinion regarding migraines  Assessment/Plan:   Chronic migraine without aura, without status migrainosus, intractable Idiopathic intracranial hypertension   I think that Ms. Zimmerle needs to get the IIH treated.  She had significant improvement in her headaches following both lumbar punctures.  As long as her IIH is uncontrolled, her migraines are unlikely to be adequately treated.  She has tried and failed multiple migraine preventatives over many drug categories.  We can consider trying other migraine therapies such as Vyepti, but we will never know its full potential while she continues to have symptomatic increased intracranial pressure.  She has had adverse reaction to the available carbonic anhydrase medications and diuretics used to treat IIH.  Therefore, I would recommend seeing a neurosurgeon for possible procedure.  Her MRV did not reveal any venous sinus stenosis, therefore a stent may not be an option but a VP shunt may be indicated.  She will discuss this option with her neurologist, Dr. Mason Sole, and her neurosurgeon, Dr. Nat Badger.   Limit use of pain relievers to no more than 9 days out of the month to prevent risk of rebound or medication-overuse headache.  Would continue Nurtec or Ubrelvy  for acute therapy but advised to discontinue NSAIDs and over the counter analgesics (which are not effective) Discussed lifestyle modification:  caffeine  cessation, increased hydration, migraine diet, exercise and sleep hygiene.  Total time spent in chart, reviewing notes, images and face to face with patient:  71 minutes.   Subjective:  Ashley Wiley is a 48 year old right-handed female with HLD, DM 2, depression, anxiety who presents for second opinion regarding migraine.  History supplemented by  neurologist's and referring provider's notes.  Had occasional headaches as a child and teenager.  She has longstanding history of chronic headache since 2019.  Usually right sided (face, periaural, neck) pounding.  Associated with nausea, sometimes vomiting, photophobia, phonophobia, osmophobia, blurred vision, right sided facial numbness, right eye lacrimation (occasionally left), right ptosis.  Denies conjunctival injection.  Always a persistent 2-3/10 baseline headache.  When they flare up, they are 10/10 lasting several hours but will start feeling relief with Nurtec and Ubrelvy .  No known triggers.    Workup led to an MRA in 2018 which revealed 5 x 3 mm anterior communicating artery aneurysm.  Underwent coiling and followed by Dr. Nat Badger.  Headaches did not improve.  MRA of head on 04/30/2023 revealed "coil mass at the site of the treated anterior communicating artery aneurysm.  Small amount of flow related enhancement at the posterioinferior aspect may represent a small amount of residual filling.    She endorsed severe headaches in 2023 worse with valsalva, visual obscurations (transient darkening or blurred vision) and pulsatile tinnitus.  Head imaging has demonstrated empty sella.  In 2023, she underwent LP which revealed elevated opening pressure of 3 cm H2O.  MRV of head on 04/19/2022 was normal without dural sinus thrombosis or significant dural sinus stenosis.  LP relieved headache for several months but returned.  Tried and failed acetazolamide , topiramate  and furosemide  due to side effects.  Repeat LP in November 2024 revealed opening pressure of 32 cm H2O.  She again noted significant improvement in headaches, vision and pulsatile tinnitus but for this time for just one or two months. Still experiences visual obscurations and pulsatile tinnitus.  She has worked  on her diet and has lost 20 lbs but no improvement.  She is not on birth control medication.  Recent dilated eye exam was okay without  evidence of papilledema.  She uses Ubrelvy  and Nurtec for acute therapy.  Nurtec appears to be more effective.  At this time, she takes some form of NSAID or analgesic almost daily (etodolac  4-5 nights a week, Tylenol  1-2 times a week, ibuprofen  1 day a week).  Imaging: 04/30/2023 MRA HEAD WO:  Coil mass at the site of treated anterior communicating artery aneurysm. Small amount of flow related enhancement at the posteroinferior aspect may represent a small amount of residual filling. 03/21/2023 MRI BRAIN W WO:  1. Stable compared to December 2022. 2. Partially empty sella, correlate for signs of chronic elevated intracranial pressure. 3. Few remote white matter insults that could be from chronic small vessel ischemia, migraine, trauma (chronic blowout fracture of the medial right orbit), or inflammatory process. No interval progression. 4. Opacified left frontal sinus. 04/19/2022 MRV HEAD W WO:  1. Normal intracranial MRV. No evidence for dural sinus thrombosis. No significant dural sinus stenosis by MRV. 2. Empty sella, stable. 06/08/2021 MRI C-SPINE WO:  1. Mild multilevel degenerative change without significant canal or foraminal stenosis. 2. Approximately 1 cm T3 vertebral body lesion which is favored to represent an atypical lipid poor vertebral venous malformation in the absence of a known primary malignancy. 09/08/2014 MRI FACE/TRIGEMINAL W WO:  No abnormal enhancement of the fifth cranial nerve. The basilar artery extends towards the left but does not cause  compression of the cisternal aspect of the fifth cranial nerve.  Branch vessels extent toward the cisternal aspect of the fifth cranial nerve however this is often seen as an incidental finding.  Evidence of prior right medial orbital wall fracture versus congenitally dehiscence with fat extending into the right ethmoid  sinus. No extension of the medial rectus into this region. Exophthalmos. Opacification frontal sinuses and anterior left  ethmoid sinus air  cell which appears slightly expanded and may represent a mucocele. No intraorbital or intracranial extension. Expanded partially empty sella. This can be seen with pseudotumor cerebri.   Past NSAIDS/analgesics:  naproxen , Toradol  inj, Fioricet Past abortive triptans:  sumatriptan  tab, rizatriptan Past abortive ergotamine:  none Past muscle relaxants:  baclofen  Past anti-emetic:  promethazine Past antihypertensive medications:  propranolol , candesartan , furosemide  (dizziness) Past antidepressant medications:  nortriptyline , venlafaxine , duloxetine, sertraline , fluoxetine , bupropion  Past anticonvulsant medications:  topiramate  (forgetfulness), gabapentin , pregablin Past anti-CGRP:  Aimovig , Emgality  (injection site pain) Past vitamins/Herbal/Supplements:  magnesium, riboflavin, CoQ10 Past antihistamines/decongestants:  Benadryl Other past therapies:  Physical therapy, dry needling, Abilify , acetazolamide , prednisone , sinus surgery, Nerivio  Current NSAIDS/analgesics:  Tylenol , ibuprofen , etodolac  200mg  Current triptans:  none Current ergotamine:  none Current anti-emetic:  Zofran  8mg  Current muscle relaxants:  none Current Antihypertensive medications:  none Current Antidepressant medications:  none Current Anticonvulsant medications:  none Current anti-CGRP:  Nurtec PRN, Ubrelvy  100mg  Current Vitamins/Herbal/Supplements:  B12 Current Antihistamines/Decongestants:  Flonase , Claritin  Other therapy:  Botox  (status post 2 doses, improvement after first dose but not after second dose) Birth control:  no  Caffeine :  1 cup coffee daily. Sometimes 4 oz Diet Coke Alcohol:  no Smoker:  no Diet:  48 to 60 oz water  daily.  Eats rice, bake/grilled chicken, rice, vegetables.  Usually eats at home.  Limited fast food.   Exercise:  Difficult due to the headache.  Prior to 2019, exercised 2-3 hours a day.  Depression:  no; Anxiety:  yes, due to the pain and noise that aggravates  the headache Other pain:  no Sleep hygiene:  Falls asleep quickly but wakes up during the night due to the pain.  If headache not severe, may fall back asleep.  Usually goes to bed at 10 PM and gets up at 6:30-6:45 AM.     She is a CMA.  Now works day shift (8AM-5PM) No history of head trauma or concussion Family history of headache:  mom (migraines)      PAST MEDICAL HISTORY: Past Medical History:  Diagnosis Date   Allergy    Aneurysm (HCC)    Anxiety    Back pain    Constipation    Depression    Dermatofibroma    Diabetes mellitus without complication (HCC)    Takes Metformin   dx 2016   Facial pain    Facial pain    GERD (gastroesophageal reflux disease)    Headache    Hyperlipidemia    IFG (impaired fasting glucose)    Low HDL (under 40)    Migraine    Obesity    Overweight    Patella-femoral syndrome    Pinguecula    Prediabetes    Thyroid  nodule april 2016   Vitamin B12 deficiency    Vitamin D  deficiency disease     PAST SURGICAL HISTORY: Past Surgical History:  Procedure Laterality Date   CARPAL TUNNEL RELEASE     COLONOSCOPY WITH PROPOFOL  N/A 04/11/2023   Procedure: COLONOSCOPY WITH PROPOFOL ;  Surgeon: Selena Daily, MD;  Location: [facility] END[facility]COPY;  Service: Gastroenterology;  Laterality: N/A;   GANGLION CYST EXCISION Left    IR 3D INDEPENDENT WKST  12/04/2017   IR ANGIO INTRA EXTRACRAN SEL INTERNAL CAROTID BILAT MOD SED  12/04/2017   IR ANGIO INTRA EXTRACRAN SEL INTERNAL CAROTID BILAT MOD SED  05/17/2021   IR ANGIO INTRA EXTRACRAN SEL INTERNAL CAROTID UNI L MOD SED  03/05/2018   IR ANGIO INTRA EXTRACRAN SEL INTERNAL CAROTID UNI L MOD SED  05/02/2019   IR ANGIO VERTEBRAL SEL VERTEBRAL BILAT MOD SED  12/04/2017   IR ANGIO VERTEBRAL SEL VERTEBRAL UNI R MOD SED  05/17/2021   IR ANGIOGRAM FOLLOW UP STUDY  03/05/2018   IR ANGIOGRAM FOLLOW UP STUDY  03/05/2018   IR ANGIOGRAM FOLLOW UP STUDY  03/05/2018   IR NEURO EACH ADD'L AFTER BASIC UNI LEFT (MS)  03/05/2018    IR TRANSCATH/EMBOLIZ  03/05/2018   IR US  GUIDE VASC ACCESS RIGHT  05/17/2021   NASAL SINUS SURGERY  Nov 2015   RADIOLOGY WITH ANESTHESIA N/A 03/05/2018   Procedure: Arteriogram, coil embolization, possible stenting;  Surgeon: Augusto Blonder, MD;  Location: [facility] OR;  Service: Radiology;  Laterality: N/A;   TUBAL LIGATION      MEDICATIONS: Current Outpatient Medications on File Prior to Visit  Medication Sig Dispense Refill   baclofen  (LIORESAL ) 10 MG tablet Take 1 tablet (10 mg total) by mouth at bedtime as needed for muscle spasms. 30 each 3   botulinum toxin Type A  (BOTOX ) 200 units injection MD to inject 200 units in facial and neck muscles every 3 months 1 each 3   Cholecalciferol  (VITAMIN D ) 125 MCG (5000 UT) CAPS      clotrimazole -betamethasone  (LOTRISONE ) cream Apply 1 Application topically daily. 30 g 0   ergocalciferol  (VITAMIN D2) 1.25 MG (50000 UT) capsule Take 1 capsule (50,000 Units total) by mouth once a week. 8 capsule 0   etodolac  (LODINE ) 200 MG capsule Take 1  capsule (200 mg total) by mouth 2 (two) times daily as needed. 45 capsule 0   fluticasone  (FLONASE ) 50 MCG/ACT nasal spray Place 2 sprays into both nostrils daily. 16 g 5   furosemide  (LASIX ) 20 MG tablet Take 1 tablet (20 mg total) by mouth 2 (two) times daily. 60 tablet 5   furosemide  (LASIX ) 20 MG tablet Take 1 tablet (20 mg total) by mouth daily. 30 tablet 3   glucose blood (FREESTYLE LITE) test strip Use to check blood sugar twice daily 100 each 12   loratadine  (CLARITIN ) 10 MG tablet Take 10 mg by mouth daily as needed for allergies.      metFORMIN  (GLUCOPHAGE -XR) 500 MG 24 hr tablet Take 1 tablet (500 mg total) by mouth daily with breakfast. 90 tablet 1   omeprazole  (PRILOSEC) 20 MG capsule Take 1 capsule (20 mg total) by mouth daily. 30 capsule 3   ondansetron  (ZOFRAN ) 8 MG tablet Take 1 tablet (8 mg total) by mouth every 8 (eight) hours as needed for nausea or vomiting. 20 tablet 1   Rimegepant Sulfate  (NURTEC)  75 MG TBDP Take 1 tablet (75 mg total) by mouth as directed. 16 tablet 6   Rimegepant Sulfate  (NURTEC) 75 MG TBDP Take 1 tablet (75 mg total) by mouth daily as needed for migraine. (No more than 75mg  in a 24 hour period). 16 tablet 6   Semaglutide , 2 MG/DOSE, 8 MG/3ML SOPN Inject 2 mg as directed once a week. 9 mL 1   Tavaborole  (KERYDIN ) 5 % SOLN Apply to affected toenail nightly 10 mL 3   Ubrogepant  (UBRELVY ) 100 MG TABS Take 1 tablet (100 mg total) by mouth daily as needed. May repeat after 2 hours if not better 9 tablet 12   No current facility-administered medications on file prior to visit.    ALLERGIES: Allergies  Allergen Reactions   Imitrex  [Sumatriptan ] Other (See Comments)    Chest pain    Wellbutrin  [Bupropion ] Other (See Comments)    Increased anxiety and tremors    FAMILY HISTORY: Family History  Problem Relation Age of Onset   Depression Mother    Anxiety disorder Mother    Heart attack Father 75   Heart failure Father    Diabetes Father    Hypertension Father    Heart disease Father    Kidney disease Father    Stroke Father    Aneurysm Father    Hyperlipidemia Father    Diabetes Paternal Grandfather    Colon cancer Brother    Sudden death Neg Hx    Breast cancer Neg Hx     Objective:  Blood pressure 134/79, pulse 94, height 5' (1.524 m), weight 152 lb (68.9 kg), SpO2 100%. General: No acute distress.  Patient appears well-groomed.   Head:  Normocephalic/atraumatic Eyes:  fundi examined but not visualized Neck: supple, no paraspinal tenderness, full range of motion Heart: regular rate and rhythm Lungs: Clear to auscultation bilaterally. Neurological Exam: Mental status: alert and oriented to person, place, and time, speech fluent and not dysarthric, language intact. Cranial nerves: CN I: not tested CN II: pupils equal, round and reactive to light, visual fields intact CN III, IV, VI:  full range of motion, no nystagmus, no ptosis CN V: facial  sensation reduced right V1-V3 (chronic) CN VII: upper and lower face symmetric CN VIII: hearing intact CN IX, X: gag intact, uvula midline CN XI: sternocleidomastoid and trapezius muscles intact CN XII: tongue midline Bulk & Tone: normal, no fasciculations. Motor:  muscle strength 5/5 throughout Sensation:  Pinprick and vibratory sensation intact. Deep Tendon Reflexes:  2+ throughout,  toes downgoing.   Finger to nose testing:  Without dysmetria.   Gait:  Normal station and stride.  Romberg negative.    Thank you for allowing me to take part in the care of this patient.  Janne Members, DO  CC:  Terre Ferri, DO

## 2024-04-09 ENCOUNTER — Encounter: Payer: Self-pay | Admitting: Neurology

## 2024-04-09 ENCOUNTER — Ambulatory Visit (INDEPENDENT_AMBULATORY_CARE_PROVIDER_SITE_OTHER): Admitting: Neurology

## 2024-04-09 VITALS — BP 134/79 | HR 94 | Ht 60.0 in | Wt 152.0 lb

## 2024-04-09 DIAGNOSIS — G932 Benign intracranial hypertension: Secondary | ICD-10-CM | POA: Diagnosis not present

## 2024-04-09 DIAGNOSIS — Z8679 Personal history of other diseases of the circulatory system: Secondary | ICD-10-CM | POA: Diagnosis not present

## 2024-04-09 DIAGNOSIS — G43711 Chronic migraine without aura, intractable, with status migrainosus: Secondary | ICD-10-CM | POA: Diagnosis not present

## 2024-04-09 DIAGNOSIS — Z9889 Other specified postprocedural states: Secondary | ICD-10-CM | POA: Diagnosis not present

## 2024-04-15 ENCOUNTER — Other Ambulatory Visit: Payer: Self-pay

## 2024-04-16 ENCOUNTER — Other Ambulatory Visit: Payer: Self-pay

## 2024-04-17 ENCOUNTER — Other Ambulatory Visit: Payer: Self-pay

## 2024-05-07 ENCOUNTER — Other Ambulatory Visit: Payer: Self-pay

## 2024-05-09 ENCOUNTER — Ambulatory Visit: Payer: Self-pay | Admitting: Family Medicine

## 2024-05-09 ENCOUNTER — Other Ambulatory Visit: Payer: Self-pay

## 2024-05-09 VITALS — BP 126/73 | HR 82 | Temp 98.4°F | Ht 60.0 in | Wt 153.0 lb

## 2024-05-09 DIAGNOSIS — E1165 Type 2 diabetes mellitus with hyperglycemia: Secondary | ICD-10-CM

## 2024-05-09 DIAGNOSIS — E559 Vitamin D deficiency, unspecified: Secondary | ICD-10-CM

## 2024-05-09 DIAGNOSIS — G43009 Migraine without aura, not intractable, without status migrainosus: Secondary | ICD-10-CM

## 2024-05-09 DIAGNOSIS — G932 Benign intracranial hypertension: Secondary | ICD-10-CM | POA: Diagnosis not present

## 2024-05-09 DIAGNOSIS — E782 Mixed hyperlipidemia: Secondary | ICD-10-CM

## 2024-05-09 DIAGNOSIS — F332 Major depressive disorder, recurrent severe without psychotic features: Secondary | ICD-10-CM

## 2024-05-09 LAB — BAYER DCA HB A1C WAIVED: HB A1C (BAYER DCA - WAIVED): 5.5 % (ref 4.8–5.6)

## 2024-05-09 MED ORDER — OMEPRAZOLE 20 MG PO CPDR
20.0000 mg | DELAYED_RELEASE_CAPSULE | Freq: Every day | ORAL | 1 refills | Status: DC
  Filled 2024-05-09: qty 90, 90d supply, fill #0
  Filled 2024-09-18: qty 90, 90d supply, fill #1

## 2024-05-09 MED ORDER — SEMAGLUTIDE (2 MG/DOSE) 8 MG/3ML ~~LOC~~ SOPN
2.0000 mg | PEN_INJECTOR | SUBCUTANEOUS | 1 refills | Status: DC
  Filled 2024-05-09: qty 9, 84d supply, fill #0
  Filled 2024-09-18: qty 3, 28d supply, fill #1
  Filled 2024-09-22: qty 9, 84d supply, fill #1
  Filled 2024-09-23: qty 3, 28d supply, fill #1
  Filled 2024-11-07: qty 3, 28d supply, fill #2

## 2024-05-09 MED ORDER — METFORMIN HCL ER 500 MG PO TB24
500.0000 mg | ORAL_TABLET | Freq: Every day | ORAL | 1 refills | Status: DC
  Filled 2024-05-09 – 2024-09-18 (×2): qty 90, 90d supply, fill #0
  Filled 2024-11-18: qty 90, 90d supply, fill #1

## 2024-05-09 MED ORDER — ONDANSETRON HCL 8 MG PO TABS
8.0000 mg | ORAL_TABLET | Freq: Three times a day (TID) | ORAL | 12 refills | Status: AC | PRN
  Filled 2024-05-09: qty 60, 20d supply, fill #0
  Filled 2024-11-18: qty 60, 20d supply, fill #1

## 2024-05-09 NOTE — Progress Notes (Signed)
 BP 126/73 (BP Location: Left Arm, Patient Position: Sitting)   Pulse 82   Temp 98.4 F (36.9 C) (Oral)   Ht 5' (1.524 m)   Wt 153 lb (69.4 kg)   SpO2 96%   BMI 29.88 kg/m    Subjective:    Patient ID: Ashley Wiley, female    DOB: Feb 18, 1976, 48 y.o.   MRN: [id number]  HPI: [name] is a 48 y.o. female  Chief Complaint  Patient presents with   Diabetes   Hyperlipidemia   Depression   Went to get a 2nd opinion on her headaches- they think it's from the increased intracranial pressure. She is seeing the neurosurgeon on Monday and following up with neurology next week. She feeling more confused with the pain and the pain is getting worse.   DIABETES Hypoglycemic episodes:no Polydipsia/polyuria: no Visual disturbance: yes Chest pain: no Paresthesias: no Glucose Monitoring: yes  Accucheck frequency: Daily Taking Insulin ?: no Blood Pressure Monitoring: rarely Retinal Examination: Up to Date Foot Exam: Up to Date Diabetic Education: Completed Pneumovax: Up to Date Influenza: Up to Date Aspirin : no  HYPERLIPIDEMIA Hyperlipidemia status: excellent compliance Satisfied with current treatment?  yes Side effects:  no Past cholesterol meds: none Supplements: none Aspirin :  no The 10-year ASCVD risk score (Arnett DK, et al., 2019) is: 4.1%   Values used to calculate the score:     Age: 56 years     Clincally relevant sex: Female     Is Non-Hispanic African American: No     Diabetic: Yes     Tobacco smoker: No     Systolic Blood Pressure: 126 mmHg     Is BP treated: No     HDL Cholesterol: 34 mg/dL     Total Cholesterol: 225 mg/dL Chest pain:  no Coronary artery disease:  no  DEPRESSION Mood status: controlled Satisfied with current treatment?: yes Symptom severity: mild  Duration of current treatment : not on anything Psychotherapy/counseling: yes in the past Depressed mood: no Anxious mood: no Anhedonia: no Significant weight loss or gain:  no Insomnia: no  Fatigue: yes Feelings of worthlessness or guilt: no Impaired concentration/indecisiveness: no Suicidal ideations: no Hopelessness: no Crying spells: no    05/09/2024    3:21 PM 11/09/2023    3:03 PM 09/14/2023    9:14 AM 05/09/2023    9:07 AM 05/09/2023    8:43 AM  Depression screen PHQ 2/9  Decreased Interest 0 0 0 0 0  Down, Depressed, Hopeless 1 1 0 1 0  PHQ - 2 Score 1 1 0 1 0  Altered sleeping 0 1 1 3  0  Tired, decreased energy 1 0 1 0 0  Change in appetite 0 0 0 0 0  Feeling bad or failure about yourself  0 0 0 0 0  Trouble concentrating 1 1 1  0 0  Moving slowly or fidgety/restless 0 0 0 0 0  Suicidal thoughts 0 0 0 0 0  PHQ-9 Score 3 3 3 4  0  Difficult doing work/chores Somewhat difficult Somewhat difficult  Not difficult at all Not difficult at all     Relevant past medical, surgical, family and social history reviewed and updated as indicated. Interim medical history since our last visit reviewed. Allergies and medications reviewed and updated.  Review of Systems  Constitutional: Negative.   Eyes:  Positive for photophobia. Negative for pain, discharge, redness, itching and visual disturbance.  Respiratory: Negative.    Cardiovascular: Negative.   Gastrointestinal:  Negative.   Musculoskeletal: Negative.   Neurological:  Positive for weakness and headaches. Negative for dizziness, tremors, seizures, syncope, facial asymmetry, speech difficulty, light-headedness and numbness.  Psychiatric/Behavioral: Negative.      Per HPI unless specifically indicated above     Objective:    BP 126/73 (BP Location: Left Arm, Patient Position: Sitting)   Pulse 82   Temp 98.4 F (36.9 C) (Oral)   Ht 5' (1.524 m)   Wt 153 lb (69.4 kg)   SpO2 96%   BMI 29.88 kg/m   Wt Readings from Last 3 Encounters:  05/09/24 153 lb (69.4 kg)  04/09/24 152 lb (68.9 kg)  11/09/23 148 lb (67.1 kg)    Physical Exam Vitals and nursing note reviewed.  Constitutional:       General: She is not in acute distress.    Appearance: Normal appearance. She is not ill-appearing, toxic-appearing or diaphoretic.  HENT:     Head: Normocephalic and atraumatic.     Right Ear: External ear normal.     Left Ear: External ear normal.     Nose: Nose normal.     Mouth/Throat:     Mouth: Mucous membranes are moist.     Pharynx: Oropharynx is clear.   Eyes:     General: No scleral icterus.       Right eye: No discharge.        Left eye: No discharge.     Extraocular Movements: Extraocular movements intact.     Conjunctiva/sclera: Conjunctivae normal.     Pupils: Pupils are equal, round, and reactive to light.    Cardiovascular:     Rate and Rhythm: Normal rate and regular rhythm.     Pulses: Normal pulses.     Heart sounds: Normal heart sounds. No murmur heard.    No friction rub. No gallop.  Pulmonary:     Effort: Pulmonary effort is normal. No respiratory distress.     Breath sounds: Normal breath sounds. No stridor. No wheezing, rhonchi or rales.  Chest:     Chest wall: No tenderness.   Musculoskeletal:        General: Normal range of motion.     Cervical back: Normal range of motion and neck supple.   Skin:    General: Skin is warm and dry.     Capillary Refill: Capillary refill takes less than 2 seconds.     Coloration: Skin is not jaundiced or pale.     Findings: No bruising, erythema, lesion or rash.   Neurological:     General: No focal deficit present.     Mental Status: She is alert and oriented to person, place, and time. Mental status is at baseline.   Psychiatric:        Mood and Affect: Mood normal.        Behavior: Behavior normal.        Thought Content: Thought content normal.        Judgment: Judgment normal.     Results for orders placed or performed in visit on 11/09/23  Bayer DCA Hb A1c Waived   Collection Time: 11/09/23  3:05 PM  Result Value Ref Range   HB A1C (BAYER DCA - WAIVED) 5.6 4.8 - 5.6 %  Microalbumin, Urine Waived    Collection Time: 11/09/23  3:05 PM  Result Value Ref Range   Microalb, Ur Waived 10 0 - 19 mg/L   Creatinine, Urine Waived 10 10 - 300 mg/dL   Microalb/Creat Ratio  30-300 (H) <30 mg/g  CBC with Differential/Platelet   Collection Time: 11/09/23  3:06 PM  Result Value Ref Range   WBC 6.9 3.4 - 10.8 x10E3/uL   RBC 4.26 3.77 - 5.28 x10E6/uL   Hemoglobin 12.9 11.1 - 15.9 g/dL   Hematocrit 41.3 24.4 - 46.6 %   MCV 92 79 - 97 fL   MCH 30.3 26.6 - 33.0 pg   MCHC 32.9 31.5 - 35.7 g/dL   RDW 01.0 27.2 - 53.6 %   Platelets 168 150 - 450 x10E3/uL   Neutrophils 53 Not Estab. %   Lymphs 37 Not Estab. %   Monocytes 7 Not Estab. %   Eos 3 Not Estab. %   Basos 0 Not Estab. %   Neutrophils Absolute 3.7 1.4 - 7.0 x10E3/uL   Lymphocytes Absolute 2.5 0.7 - 3.1 x10E3/uL   Monocytes Absolute 0.5 0.1 - 0.9 x10E3/uL   EOS (ABSOLUTE) 0.2 0.0 - 0.4 x10E3/uL   Basophils Absolute 0.0 0.0 - 0.2 x10E3/uL   Immature Granulocytes 0 Not Estab. %   Immature Grans (Abs) 0.0 0.0 - 0.1 x10E3/uL  Comprehensive metabolic panel   Collection Time: 11/09/23  3:06 PM  Result Value Ref Range   Glucose 102 (H) 70 - 99 mg/dL   BUN 15 6 - 24 mg/dL   Creatinine, Ser 6.44 0.57 - 1.00 mg/dL   eGFR 034 >74 QV/ZDG/3.87   BUN/Creatinine Ratio 25 (H) 9 - 23   Sodium 138 134 - 144 mmol/L   Potassium 4.0 3.5 - 5.2 mmol/L   Chloride 100 96 - 106 mmol/L   CO2 24 20 - 29 mmol/L   Calcium  9.5 8.7 - 10.2 mg/dL   Total Protein 6.8 6.0 - 8.5 g/dL   Albumin 4.6 3.9 - 4.9 g/dL   Globulin, Total 2.2 1.5 - 4.5 g/dL   Bilirubin Total 1.0 0.0 - 1.2 mg/dL   Alkaline Phosphatase 87 44 - 121 IU/L   AST 19 0 - 40 IU/L   ALT 19 0 - 32 IU/L  Lipid Panel w/o Chol/HDL Ratio   Collection Time: 11/09/23  3:06 PM  Result Value Ref Range   Cholesterol, Total 225 (H) 100 - 199 mg/dL   Triglycerides 564 (H) 0 - 149 mg/dL   HDL 34 (L) >33 mg/dL   VLDL Cholesterol Cal 50 (H) 5 - 40 mg/dL   LDL Chol Calc (NIH) 295 (H) 0 - 99 mg/dL  TSH    Collection Time: 11/09/23  3:06 PM  Result Value Ref Range   TSH 1.150 0.450 - 4.500 uIU/mL  VITAMIN D  25 Hydroxy (Vit-D Deficiency, Fractures)   Collection Time: 11/09/23  3:06 PM  Result Value Ref Range   Vit D, 25-Hydroxy 22.8 (L) 30.0 - 100.0 ng/mL      Assessment & Plan:   Problem List Items Addressed This Visit       Cardiovascular and Mediastinum   Migraine without aura and without status migrainosus, not intractable   Not doing well. Complicated by increased intracranial pressure. Continue to follow with neurology and neurosurgery. Call with any concerns. Continue to monitor.         Endocrine   Type 2 diabetes mellitus with hyperglycemia, without long-term current use of insulin  (HCC)   Doing great with A1c of 5.5. Continue current regimen. Continue to monitor. Call with any concerns.       Relevant Medications   metFORMIN  (GLUCOPHAGE -XR) 500 MG 24 hr tablet   Semaglutide , 2 MG/DOSE, 8 MG/3ML SOPN  Other Relevant Orders   Bayer DCA Hb A1c Waived   CBC with Differential/Platelet   Lipid Panel w/o Chol/HDL Ratio   Comprehensive metabolic panel with GFR     Other   Hyperlipidemia - Primary   Under good control on current regimen. Continue current regimen. Continue to monitor. Call with any concerns. Refills given. Labs drawn today.        Relevant Orders   CBC with Differential/Platelet   Lipid Panel w/o Chol/HDL Ratio   Comprehensive metabolic panel with GFR   Vitamin D  deficiency disease   Rechecking labs today await results. Treat as needed.       Relevant Orders   CBC with Differential/Platelet   Lipid Panel w/o Chol/HDL Ratio   Comprehensive metabolic panel with GFR   VITAMIN D  25 Hydroxy (Vit-D Deficiency, Fractures)   Depression   Doing great not on any medicine. Continue to monitor. Call with any concerns.        Relevant Orders   CBC with Differential/Platelet   Lipid Panel w/o Chol/HDL Ratio   Comprehensive metabolic panel with GFR    Intracranial pressure increased   Not doing well. Complicating migraines. Continue to follow with neurology and neurosurgery. Call with any concerns. Continue to monitor.         Follow up plan: Return in about 6 months (around 11/08/2024) for physical.

## 2024-05-09 NOTE — Assessment & Plan Note (Signed)
 Not doing well. Complicating migraines. Continue to follow with neurology and neurosurgery. Call with any concerns. Continue to monitor.

## 2024-05-09 NOTE — Assessment & Plan Note (Signed)
 Doing great with A1c of 5.5. Continue current regimen. Continue to monitor. Call with any concerns.

## 2024-05-09 NOTE — Assessment & Plan Note (Signed)
 Not doing well. Complicated by increased intracranial pressure. Continue to follow with neurology and neurosurgery. Call with any concerns. Continue to monitor.

## 2024-05-09 NOTE — Assessment & Plan Note (Signed)
Rechecking labs today await results. Treat as needed.  

## 2024-05-09 NOTE — Assessment & Plan Note (Signed)
 Doing great not on any medicine. Continue to monitor. Call with any concerns.

## 2024-05-09 NOTE — Assessment & Plan Note (Signed)
 Under good control on current regimen. Continue current regimen. Continue to monitor. Call with any concerns. Refills given. Labs drawn today.

## 2024-05-10 ENCOUNTER — Other Ambulatory Visit: Payer: Self-pay

## 2024-05-10 LAB — COMPREHENSIVE METABOLIC PANEL WITH GFR
ALT: 19 IU/L (ref 0–32)
AST: 17 IU/L (ref 0–40)
Albumin: 4.3 g/dL (ref 3.9–4.9)
Alkaline Phosphatase: 112 IU/L (ref 44–121)
BUN/Creatinine Ratio: 28 — ABNORMAL HIGH (ref 9–23)
BUN: 11 mg/dL (ref 6–24)
Bilirubin Total: 1.1 mg/dL (ref 0.0–1.2)
CO2: 19 mmol/L — ABNORMAL LOW (ref 20–29)
Calcium: 9.3 mg/dL (ref 8.7–10.2)
Chloride: 100 mmol/L (ref 96–106)
Creatinine, Ser: 0.4 mg/dL — ABNORMAL LOW (ref 0.57–1.00)
Globulin, Total: 2 g/dL (ref 1.5–4.5)
Glucose: 116 mg/dL — ABNORMAL HIGH (ref 70–99)
Potassium: 3.9 mmol/L (ref 3.5–5.2)
Sodium: 137 mmol/L (ref 134–144)
Total Protein: 6.3 g/dL (ref 6.0–8.5)
eGFR: 124 mL/min/{1.73_m2} (ref >59)

## 2024-05-10 LAB — LIPID PANEL W/O CHOL/HDL RATIO
Cholesterol, Total: 194 mg/dL (ref 100–199)
HDL: 31 mg/dL — ABNORMAL LOW (ref >39)
LDL Chol Calc (NIH): 72 mg/dL (ref 0–99)
Triglycerides: 582 mg/dL (ref 0–149)
VLDL Cholesterol Cal: 91 mg/dL — ABNORMAL HIGH (ref 5–40)

## 2024-05-10 LAB — CBC WITH DIFFERENTIAL/PLATELET
Basophils Absolute: 0 10*3/uL (ref 0.0–0.2)
Basos: 1 %
EOS (ABSOLUTE): 0.2 10*3/uL (ref 0.0–0.4)
Eos: 2 %
Hematocrit: 39 % (ref 34.0–46.6)
Hemoglobin: 13.3 g/dL (ref 11.1–15.9)
Immature Grans (Abs): 0 10*3/uL (ref 0.0–0.1)
Immature Granulocytes: 0 %
Lymphocytes Absolute: 2.4 10*3/uL (ref 0.7–3.1)
Lymphs: 29 %
MCH: 31.4 pg (ref 26.6–33.0)
MCHC: 34.1 g/dL (ref 31.5–35.7)
MCV: 92 fL (ref 79–97)
Monocytes Absolute: 0.5 10*3/uL (ref 0.1–0.9)
Monocytes: 6 %
Neutrophils Absolute: 4.9 10*3/uL (ref 1.4–7.0)
Neutrophils: 61 %
Platelets: 182 10*3/uL (ref 150–450)
RBC: 4.23 x10E6/uL (ref 3.77–5.28)
RDW: 13.5 % (ref 11.7–15.4)
WBC: 8 10*3/uL (ref 3.4–10.8)

## 2024-05-10 LAB — VITAMIN D 25 HYDROXY (VIT D DEFICIENCY, FRACTURES): Vit D, 25-Hydroxy: 19.3 ng/mL — ABNORMAL LOW (ref 30.0–100.0)

## 2024-05-12 ENCOUNTER — Ambulatory Visit: Payer: Self-pay | Admitting: Family Medicine

## 2024-05-12 ENCOUNTER — Other Ambulatory Visit: Payer: Self-pay

## 2024-05-12 DIAGNOSIS — I671 Cerebral aneurysm, nonruptured: Secondary | ICD-10-CM | POA: Diagnosis not present

## 2024-05-12 DIAGNOSIS — G932 Benign intracranial hypertension: Secondary | ICD-10-CM | POA: Diagnosis not present

## 2024-05-12 MED ORDER — VITAMIN D (ERGOCALCIFEROL) 1.25 MG (50000 UNIT) PO CAPS
50000.0000 [IU] | ORAL_CAPSULE | ORAL | 1 refills | Status: AC
  Filled 2024-05-12: qty 12, 84d supply, fill #0
  Filled 2024-09-18: qty 12, 84d supply, fill #1

## 2024-05-13 ENCOUNTER — Other Ambulatory Visit: Payer: Self-pay

## 2024-05-14 ENCOUNTER — Telehealth (HOSPITAL_COMMUNITY): Payer: Self-pay

## 2024-05-14 ENCOUNTER — Other Ambulatory Visit (HOSPITAL_COMMUNITY): Payer: Self-pay

## 2024-05-14 NOTE — Progress Notes (Signed)
 PA pending

## 2024-05-14 NOTE — Telephone Encounter (Signed)
 Pharmacy Patient Advocate Encounter   Received notification from Patient Pharmacy that prior authorization for Botox  is required/requested.   Insurance verification completed.   The patient is insured through [facility] .   Per test claim: PA required; PA submitted to above mentioned insurance via CoverMyMeds Key/confirmation #/EOC [id number] Status is pending

## 2024-05-15 ENCOUNTER — Other Ambulatory Visit (HOSPITAL_COMMUNITY): Payer: Self-pay

## 2024-05-15 ENCOUNTER — Other Ambulatory Visit: Payer: Self-pay

## 2024-05-15 DIAGNOSIS — G43719 Chronic migraine without aura, intractable, without status migrainosus: Secondary | ICD-10-CM | POA: Diagnosis not present

## 2024-05-15 DIAGNOSIS — G932 Benign intracranial hypertension: Secondary | ICD-10-CM | POA: Diagnosis not present

## 2024-05-15 DIAGNOSIS — Z1331 Encounter for screening for depression: Secondary | ICD-10-CM | POA: Diagnosis not present

## 2024-05-15 MED ORDER — DIVALPROEX SODIUM 250 MG PO DR TAB
250.0000 mg | DELAYED_RELEASE_TABLET | Freq: Two times a day (BID) | ORAL | 5 refills | Status: DC
  Filled 2024-05-15: qty 60, 30d supply, fill #0

## 2024-05-15 NOTE — Progress Notes (Signed)
 PA approved.

## 2024-05-15 NOTE — Telephone Encounter (Signed)
 Pharmacy Patient Advocate Encounter  Received notification from [facility] that Prior Authorization for Botox  has been APPROVED from 05/14/24 to 11/10/24   PA #/Case ID/Reference #: [id number]

## 2024-05-16 ENCOUNTER — Other Ambulatory Visit: Payer: Self-pay | Admitting: Neurology

## 2024-05-16 DIAGNOSIS — G932 Benign intracranial hypertension: Secondary | ICD-10-CM

## 2024-05-19 ENCOUNTER — Other Ambulatory Visit: Payer: Self-pay

## 2024-05-19 ENCOUNTER — Ambulatory Visit
Admission: RE | Admit: 2024-05-19 | Discharge: 2024-05-19 | Disposition: A | Discharge status: Home / Self Care | Source: Ambulatory Visit | Attending: Neurology | Admitting: Neurology

## 2024-05-19 DIAGNOSIS — G932 Benign intracranial hypertension: Secondary | ICD-10-CM | POA: Insufficient documentation

## 2024-05-19 MED ORDER — LIDOCAINE HCL (PF) 1 % IJ SOLN
10.0000 mL | Freq: Once | INTRAMUSCULAR | Status: AC
  Administered 2024-05-19: 5 mL
  Filled 2024-05-19: qty 10

## 2024-05-19 NOTE — Procedures (Signed)
    PROCEDURE SUMMARY:   Successful image-guided lumbar puncture at level of L3/4  Opening pressure 25 cm water .  Closing pressure 13 cm water  Yielded 12 mL of clear  CSF.  No immediate complications.  EBL = trace. Patient tolerated well.    Specimen was sent for labs.   Please see imaging section of Epic for full dictation.   Post procedure orders placed.  - Bedrest with bathroom privileges for 2 hours  - Remain flat in the bed for rest of the day  - Tylenol  for headache, up to 4,000 mg a day

## 2024-05-20 ENCOUNTER — Other Ambulatory Visit: Payer: Self-pay

## 2024-05-22 ENCOUNTER — Other Ambulatory Visit (HOSPITAL_COMMUNITY): Payer: Self-pay

## 2024-05-26 ENCOUNTER — Other Ambulatory Visit (HOSPITAL_COMMUNITY): Payer: Self-pay

## 2024-05-29 ENCOUNTER — Other Ambulatory Visit: Payer: Self-pay

## 2024-06-12 ENCOUNTER — Other Ambulatory Visit: Payer: Self-pay

## 2024-06-12 MED ORDER — BOTOX 200 UNITS IJ SOLR
INTRAMUSCULAR | 3 refills | Status: AC
  Filled 2024-10-29: qty 1, 90d supply, fill #0
  Filled 2024-12-01: qty 1, 84d supply, fill #0

## 2024-06-18 ENCOUNTER — Other Ambulatory Visit: Payer: Self-pay

## 2024-07-08 ENCOUNTER — Encounter: Payer: Self-pay | Admitting: Family Medicine

## 2024-07-17 ENCOUNTER — Other Ambulatory Visit: Payer: Self-pay

## 2024-07-17 NOTE — Progress Notes (Signed)
 Patient has not filled since 11/2023. Dis-enrolling.

## 2024-07-23 ENCOUNTER — Encounter: Payer: Self-pay | Admitting: Family Medicine

## 2024-07-23 ENCOUNTER — Other Ambulatory Visit: Payer: Self-pay

## 2024-07-23 DIAGNOSIS — E1165 Type 2 diabetes mellitus with hyperglycemia: Secondary | ICD-10-CM

## 2024-07-23 NOTE — Telephone Encounter (Signed)
 Patient sent mychart message requesting a new meter, strips, and lancets be sent in for her.

## 2024-07-27 ENCOUNTER — Other Ambulatory Visit: Payer: Self-pay

## 2024-07-27 MED ORDER — ACCU-CHEK GUIDE TEST VI STRP
1.0000 | ORAL_STRIP | Freq: Every day | 3 refills | Status: AC
  Filled 2024-07-27: qty 100, 90d supply, fill #0
  Filled 2024-11-18: qty 100, 90d supply, fill #1

## 2024-07-27 MED ORDER — FREESTYLE FREEDOM LITE W/DEVICE KIT
1.0000 | PACK | Freq: Every day | 0 refills | Status: AC
  Filled 2024-07-27: qty 1, 30d supply, fill #0

## 2024-07-27 MED ORDER — FREESTYLE LANCETS MISC
1.0000 | Freq: Every day | 3 refills | Status: AC
  Filled 2024-07-27: qty 100, 90d supply, fill #0

## 2024-07-30 ENCOUNTER — Other Ambulatory Visit (HOSPITAL_COMMUNITY): Payer: Self-pay

## 2024-07-30 ENCOUNTER — Other Ambulatory Visit: Payer: Self-pay

## 2024-08-05 ENCOUNTER — Other Ambulatory Visit: Payer: Self-pay

## 2024-08-05 NOTE — Progress Notes (Signed)
 Specialty Pharmacy Initial Fill Coordination Note  Ashley Wiley is a 48 y.o. female contacted today regarding initial fill of specialty medication(s) OnabotulinumtoxinA  (Botox )   Patient requested Delivery   Delivery date: 08/07/24   Verified address: [facility]   Medication will be filled on 9/17.   Patient is aware of $0 copayment.

## 2024-08-11 ENCOUNTER — Other Ambulatory Visit: Payer: Self-pay

## 2024-08-11 NOTE — Progress Notes (Signed)
 Misty from MD office called this morning Stating they have not gotten Botox  medication shipment. Checked [facility] was Shipped on 9/17 for delivery 9/18. But [facility] enter as Delivery issues & returning to Pharmacy. Called [facility] to find out what was deliver issues & rep states they need to verify the address they could not locate patient/ Address. I inform the Rep that this is a [facility] office & we have couriered to this address in the past for this patient & other patients. Informed Rph Holly aware of what is going on. Waiting for medication to come back to [facility] to re-ship out on 9/23 to [facility] office. Tamra is aware & looking out for package & will re-send as well.

## 2024-08-14 ENCOUNTER — Other Ambulatory Visit: Payer: Self-pay

## 2024-08-22 ENCOUNTER — Ambulatory Visit: Admitting: Family Medicine

## 2024-09-02 ENCOUNTER — Other Ambulatory Visit: Payer: Self-pay

## 2024-09-02 DIAGNOSIS — G43719 Chronic migraine without aura, intractable, without status migrainosus: Secondary | ICD-10-CM | POA: Diagnosis not present

## 2024-09-15 DIAGNOSIS — J329 Chronic sinusitis, unspecified: Secondary | ICD-10-CM | POA: Diagnosis not present

## 2024-09-15 DIAGNOSIS — G932 Benign intracranial hypertension: Secondary | ICD-10-CM | POA: Diagnosis not present

## 2024-09-15 DIAGNOSIS — G43719 Chronic migraine without aura, intractable, without status migrainosus: Secondary | ICD-10-CM | POA: Diagnosis not present

## 2024-09-18 ENCOUNTER — Other Ambulatory Visit: Payer: Self-pay

## 2024-09-19 ENCOUNTER — Other Ambulatory Visit: Payer: Self-pay

## 2024-09-19 MED ORDER — NURTEC 75 MG PO TBDP
75.0000 mg | ORAL_TABLET | ORAL | 6 refills | Status: AC | PRN
  Filled 2024-09-19: qty 16, 30d supply, fill #0
  Filled 2024-11-18: qty 16, 30d supply, fill #1

## 2024-09-22 ENCOUNTER — Other Ambulatory Visit: Payer: Self-pay

## 2024-09-23 ENCOUNTER — Other Ambulatory Visit (HOSPITAL_COMMUNITY): Payer: Self-pay

## 2024-09-23 ENCOUNTER — Other Ambulatory Visit: Payer: Self-pay

## 2024-10-01 DIAGNOSIS — J341 Cyst and mucocele of nose and nasal sinus: Secondary | ICD-10-CM | POA: Diagnosis not present

## 2024-10-06 DIAGNOSIS — J341 Cyst and mucocele of nose and nasal sinus: Secondary | ICD-10-CM | POA: Diagnosis not present

## 2024-10-06 DIAGNOSIS — J329 Chronic sinusitis, unspecified: Secondary | ICD-10-CM | POA: Diagnosis not present

## 2024-10-29 ENCOUNTER — Other Ambulatory Visit: Payer: Self-pay

## 2024-10-29 ENCOUNTER — Other Ambulatory Visit (HOSPITAL_COMMUNITY): Payer: Self-pay

## 2024-10-30 ENCOUNTER — Other Ambulatory Visit: Payer: Self-pay

## 2024-11-07 ENCOUNTER — Telehealth: Payer: Self-pay

## 2024-11-07 ENCOUNTER — Other Ambulatory Visit: Payer: Self-pay

## 2024-11-07 NOTE — Telephone Encounter (Signed)
 Pharmacy Patient Advocate Encounter   Received notification from [employer] that prior authorization for Botox  is required/requested.   Insurance verification completed.   The patient is insured through [facility].   Per test claim: PA required; PA submitted to above mentioned insurance via Latent Key/confirmation #/EOC [id number] Status is pending

## 2024-11-10 ENCOUNTER — Other Ambulatory Visit: Payer: Self-pay

## 2024-11-11 NOTE — Telephone Encounter (Signed)
 Pharmacy Patient Advocate Encounter  Received notification from [facility] that Prior Authorization for Botox  has been APPROVED from 11/11/24 to 11/10/25   PA #/Case ID/Reference #: [id number]

## 2024-11-14 ENCOUNTER — Encounter: Admitting: Family Medicine

## 2024-11-17 ENCOUNTER — Encounter: Admitting: Family Medicine

## 2024-11-18 ENCOUNTER — Other Ambulatory Visit: Payer: Self-pay

## 2024-12-01 ENCOUNTER — Other Ambulatory Visit: Payer: Self-pay

## 2024-12-01 ENCOUNTER — Other Ambulatory Visit (HOSPITAL_COMMUNITY): Payer: Self-pay

## 2024-12-01 MED ORDER — BOTOX 200 UNITS IJ SOLR
INTRAMUSCULAR | 3 refills | Status: AC
  Filled 2024-12-01: qty 1, 84d supply, fill #0

## 2024-12-01 NOTE — Progress Notes (Signed)
 Specialty Pharmacy Refill Coordination Note  Ashley Wiley is a 49 y.o. female assessed today regarding refills of clinic administered specialty medication(s) OnabotulinumtoxinA  (Botox )   Clinic requested Delivery   Delivery date: 12/03/24   Verified address: [facility]   Medication will be filled on: 12/02/24

## 2024-12-02 ENCOUNTER — Other Ambulatory Visit: Payer: Self-pay | Admitting: Medical Genetics

## 2024-12-02 ENCOUNTER — Other Ambulatory Visit: Payer: Self-pay

## 2024-12-02 ENCOUNTER — Encounter: Admitting: Family Medicine

## 2024-12-02 MED ORDER — ACETAMINOPHEN-CODEINE 300-30 MG PO TABS
1.0000 | ORAL_TABLET | ORAL | 0 refills | Status: AC | PRN
  Filled 2024-12-02: qty 5, 1d supply, fill #0

## 2024-12-02 MED ORDER — METHYLPREDNISOLONE 4 MG PO TABS
ORAL_TABLET | ORAL | 0 refills | Status: AC
  Filled 2024-12-02: qty 64, 10d supply, fill #0

## 2024-12-04 ENCOUNTER — Ambulatory Visit (INDEPENDENT_AMBULATORY_CARE_PROVIDER_SITE_OTHER): Admitting: Family Medicine

## 2024-12-04 ENCOUNTER — Encounter: Payer: Self-pay | Admitting: Family Medicine

## 2024-12-04 ENCOUNTER — Other Ambulatory Visit: Payer: Self-pay

## 2024-12-04 ENCOUNTER — Other Ambulatory Visit (HOSPITAL_COMMUNITY): Payer: Self-pay

## 2024-12-04 VITALS — BP 128/83 | HR 76 | Temp 98.3°F | Ht 60.0 in | Wt 148.0 lb

## 2024-12-04 DIAGNOSIS — E559 Vitamin D deficiency, unspecified: Secondary | ICD-10-CM

## 2024-12-04 DIAGNOSIS — F332 Major depressive disorder, recurrent severe without psychotic features: Secondary | ICD-10-CM | POA: Diagnosis not present

## 2024-12-04 DIAGNOSIS — Z0000 Encounter for general adult medical examination without abnormal findings: Secondary | ICD-10-CM

## 2024-12-04 DIAGNOSIS — E782 Mixed hyperlipidemia: Secondary | ICD-10-CM | POA: Diagnosis not present

## 2024-12-04 DIAGNOSIS — E1165 Type 2 diabetes mellitus with hyperglycemia: Secondary | ICD-10-CM | POA: Diagnosis not present

## 2024-12-04 DIAGNOSIS — Z1231 Encounter for screening mammogram for malignant neoplasm of breast: Secondary | ICD-10-CM

## 2024-12-04 MED ORDER — SEMAGLUTIDE (2 MG/DOSE) 8 MG/3ML ~~LOC~~ SOPN
2.0000 mg | PEN_INJECTOR | SUBCUTANEOUS | 1 refills | Status: AC
  Filled 2024-12-04 – 2024-12-10 (×2): qty 9, 84d supply, fill #0

## 2024-12-04 MED ORDER — FLUTICASONE PROPIONATE 50 MCG/ACT NA SUSP
2.0000 | Freq: Every day | NASAL | 5 refills | Status: AC
  Filled 2024-12-04: qty 16, 30d supply, fill #0
  Filled 2024-12-10: qty 48, 90d supply, fill #0

## 2024-12-04 MED ORDER — METFORMIN HCL ER 500 MG PO TB24
500.0000 mg | ORAL_TABLET | Freq: Every day | ORAL | 1 refills | Status: AC
  Filled 2024-12-04 – 2024-12-10 (×2): qty 90, 90d supply, fill #0

## 2024-12-04 MED ORDER — OMEPRAZOLE 20 MG PO CPDR
20.0000 mg | DELAYED_RELEASE_CAPSULE | Freq: Every day | ORAL | 1 refills | Status: AC
  Filled 2024-12-04: qty 90, 90d supply, fill #0

## 2024-12-04 NOTE — Assessment & Plan Note (Signed)
Rechecking labs today await results. Treat as needed.  

## 2024-12-04 NOTE — Patient Instructions (Signed)
 Please call to schedule your mammogram and/or bone density: [facility] at [facility]  Address: [address], [place], [place] [address] Phone: [phone]  [facility] [address] [place],  [place]  [address] Phone: [phone]

## 2024-12-04 NOTE — Assessment & Plan Note (Signed)
 Under good control on current regimen. Continue current regimen. Continue to monitor. Call with any concerns. Refills given. Labs drawn today.

## 2024-12-04 NOTE — Assessment & Plan Note (Signed)
Doing well not on medicine. Continue to monitor. Call with any concerns.

## 2024-12-04 NOTE — Progress Notes (Signed)
" ° °  Established Patient Office Visit  Subjective   Patient ID: Ashley Wiley, female    DOB: December 30, 1975  Age: 49 y.o. MRN: [id number]  Chief Complaint  Patient presents with   Annual Exam    HPI    ROS    Objective:     BP 128/83   Pulse 76   Temp 98.3 F (36.8 C) (Oral)   Ht 5' (1.524 m)   Wt 148 lb (67.1 kg)   SpO2 97%   BMI 28.90 kg/m    Physical Exam   No results found for any visits on 12/04/24.    The 10-year ASCVD risk score (Arnett DK, et al., 2019) is: 4%    Assessment & Plan:   Problem List Items Addressed This Visit       Endocrine   Type 2 diabetes mellitus with hyperglycemia, without long-term current use of insulin  (HCC)   Doing great with A1c of 5.4. Discussed stopping metformin - would like to keep on current regimen for now. Recheck 6 months.       Relevant Medications   Semaglutide , 2 MG/DOSE, 8 MG/3ML SOPN   metFORMIN  (GLUCOPHAGE -XR) 500 MG 24 hr tablet   Other Relevant Orders   Microalbumin, Urine Waived   Bayer DCA Hb A1c Waived     Other   Depression   Doing well not on medicine. Continue to monitor. Call with any concerns.       Hyperlipidemia   Under good control on current regimen. Continue current regimen. Continue to monitor. Call with any concerns. Refills given. Labs drawn today.        Vitamin D  deficiency disease   Rechecking labs today await results. Treat as needed.       Relevant Orders   VITAMIN D  25 Hydroxy (Vit-D Deficiency, Fractures)   Other Visit Diagnoses       Routine general medical examination at a health care facility    -  Primary   Relevant Orders   CBC with Differential/Platelet   Comprehensive metabolic panel with GFR   Lipid Panel w/o Chol/HDL Ratio   TSH   Microalbumin, Urine Waived   Bayer DCA Hb A1c Waived   Hepatitis B surface antibody,quantitative     Encounter for screening mammogram for malignant neoplasm of breast       Relevant Orders   MM 3D SCREENING MAMMOGRAM  BILATERAL BREAST       No follow-ups on file.    Michale ONEIDA Fogo, CMA  "

## 2024-12-04 NOTE — Assessment & Plan Note (Signed)
 Doing great with A1c of 5.4. Discussed stopping metformin - would like to keep on current regimen for now. Recheck 6 months.

## 2024-12-04 NOTE — Progress Notes (Signed)
 "  BP 128/83   Pulse 76   Temp 98.3 F (36.8 C) (Oral)   Ht 5' (1.524 m)   Wt 148 lb (67.1 kg)   SpO2 97%   BMI 28.90 kg/m    Subjective:    Patient ID: Ashley Wiley, female    DOB: 06/22/1976, 49 y.o.   MRN: [id number]  HPI: Ashley Wiley is a 49 y.o. female presenting on 12/04/2024 for comprehensive medical examination. Current medical complaints include:  DIABETES Hypoglycemic episodes:no Polydipsia/polyuria: no Visual disturbance: yes Chest pain: no Paresthesias: no Glucose Monitoring: yes Taking Insulin ?: no Blood Pressure Monitoring: occasionally Retinal Examination: Up to Date Foot Exam: Up to Date Diabetic Education: Completed Pneumovax: Up to Date Influenza: Up to Date Aspirin : no   She currently lives with: husband and kids Menopausal Symptoms: yes  Depression Screen done today and results listed below:     12/04/2024    4:27 PM 05/09/2024    3:21 PM 11/09/2023    3:03 PM 09/14/2023    9:14 AM 05/09/2023    9:07 AM  Depression screen PHQ 2/9  Decreased Interest 0 0 0 0 0  Down, Depressed, Hopeless 0 1 1 0 1  PHQ - 2 Score 0 1 1 0 1  Altered sleeping 0 0 1 1 3   Tired, decreased energy 0 1 0 1 0  Change in appetite 0 0 0 0 0  Feeling bad or failure about yourself  0 0 0 0 0  Trouble concentrating 0 1 1 1  0  Moving slowly or fidgety/restless 0 0 0 0 0  Suicidal thoughts 0 0 0 0 0  PHQ-9 Score 0 3  3  3  4    Difficult doing work/chores Not difficult at all Somewhat difficult Somewhat difficult  Not difficult at all     Data saved with a previous flowsheet row definition     Past Medical History:  Past Medical History:  Diagnosis Date   Allergy    Aneurysm    Anxiety    Back pain    Constipation    Depression    Dermatofibroma    Diabetes mellitus without complication (HCC)    Takes Metformin   dx 2016   Facial pain    Facial pain    GERD (gastroesophageal reflux disease)    Headache    Hyperlipidemia    IFG (impaired fasting  glucose)    IIH (idiopathic intracranial hypertension)    Low HDL (under 40)    Migraine    Obesity    Overweight    Patella-femoral syndrome    Pinguecula    Prediabetes    Thyroid  nodule 02/2015   Vitamin B12 deficiency    Vitamin D  deficiency disease     Surgical History:  Past Surgical History:  Procedure Laterality Date   CARPAL TUNNEL RELEASE     COLONOSCOPY WITH PROPOFOL  N/A 04/11/2023   Procedure: COLONOSCOPY WITH PROPOFOL ;  Surgeon: Unk Corinn Skiff, MD;  Location: [facility] ENDOSCOPY;  Service: Gastroenterology;  Laterality: N/A;   GANGLION CYST EXCISION Left    IR 3D INDEPENDENT WKST  12/04/2017   IR ANGIO INTRA EXTRACRAN SEL INTERNAL CAROTID BILAT MOD SED  12/04/2017   IR ANGIO INTRA EXTRACRAN SEL INTERNAL CAROTID BILAT MOD SED  05/17/2021   IR ANGIO INTRA EXTRACRAN SEL INTERNAL CAROTID UNI L MOD SED  03/05/2018   IR ANGIO INTRA EXTRACRAN SEL INTERNAL CAROTID UNI L MOD SED  05/02/2019   IR ANGIO VERTEBRAL SEL VERTEBRAL  BILAT MOD SED  12/04/2017   IR ANGIO VERTEBRAL SEL VERTEBRAL UNI R MOD SED  05/17/2021   IR ANGIOGRAM FOLLOW UP STUDY  03/05/2018   IR ANGIOGRAM FOLLOW UP STUDY  03/05/2018   IR ANGIOGRAM FOLLOW UP STUDY  03/05/2018   IR NEURO EACH ADD'L AFTER BASIC UNI LEFT (MS)  03/05/2018   IR TRANSCATH/EMBOLIZ  03/05/2018   IR US  GUIDE VASC ACCESS RIGHT  05/17/2021   NASAL SINUS SURGERY  Nov 2015   RADIOLOGY WITH ANESTHESIA N/A 03/05/2018   Procedure: Arteriogram, coil embolization, possible stenting;  Surgeon: Lanis Pupa, MD;  Location: [facility] OR;  Service: Radiology;  Laterality: N/A;   TUBAL LIGATION      Medications:  Current Outpatient Medications on File Prior to Visit  Medication Sig   acetaminophen -codeine  (TYLENOL  #3) 300-30 MG tablet Take 1 tablet by mouth every 4 (four) hours as needed for Pain for up to 5 days   Blood Glucose Monitoring Suppl (FREESTYLE FREEDOM LITE) w/Device KIT 1 each by Does not apply route daily at 2 PM.   botulinum toxin Type A  (BOTOX )  200 units injection MD to inject 200 units in facial and neck muscles every 3 months   Cholecalciferol  (VITAMIN D ) 125 MCG (5000 UT) CAPS    clotrimazole -betamethasone  (LOTRISONE ) cream Apply 1 Application topically daily.   cyanocobalamin  50 MCG tablet Take 50 mcg by mouth.   Diclofenac  Sodium 3 % GEL Apply topically.   etodolac  (LODINE ) 200 MG capsule Take 1 capsule (200 mg total) by mouth 2 (two) times daily as needed.   glucose blood (ACCU-CHEK GUIDE TEST) test strip 1 each by Other route daily. Use as instructed   Lancets (FREESTYLE) lancets 1 each by Other route daily at 2 PM. Use as instructed   loratadine  (CLARITIN ) 10 MG tablet Take 10 mg by mouth daily as needed for allergies.    methylPREDNISolone  (MEDROL ) 4 MG tablet Take 10 tablets (40 mg total) by mouth daily for 4 days, THEN 8 tablets (32 mg total) daily for 1 day, THEN 6 tablets (24 mg total) daily for 1 day, THEN 4 tablets (16 mg total) daily for 1 day, THEN 3 tablets (12 mg total) daily for 1 day, THEN 2 tablets (8 mg total) daily for 1 day, THEN 1 tablet (4 mg total) daily for 1 day.   ondansetron  (ZOFRAN ) 8 MG tablet Take 1 tablet (8 mg total) by mouth every 8 (eight) hours as needed for nausea or vomiting.   Rimegepant Sulfate  (NURTEC) 75 MG TBDP Take 1 tablet (75 mg total) by mouth as needed for migraine. No more than 75 mg in 24 hours   Vitamin D , Ergocalciferol , (DRISDOL ) 1.25 MG (50000 UNIT) CAPS capsule Take 1 capsule (50,000 Units total) by mouth every 7 (seven) days.   [DISCONTINUED] divalproex  (DEPAKOTE ) 250 MG DR tablet Take 1 tablet (250 mg total) by mouth 2 (two) times daily.   No current facility-administered medications on file prior to visit.    Allergies:  Allergies[1]  Social History:  Social History   Socioeconomic History   Marital status: Married    Spouse name: Not on file   Number of children: Not on file   Years of education: Not on file   Highest education level: Associate degree: academic  program  Occupational History   Not on file  Tobacco Use   Smoking status: Never   Smokeless tobacco: Never  Vaping Use   Vaping status: Never Used  Substance and Sexual Activity  Alcohol use: Not Currently    Alcohol/week: 1.0 standard drink of alcohol    Types: 1 Standard drinks or equivalent per week    Comment: Drinks 3-4 times per year   Drug use: Never   Sexual activity: Yes    Birth control/protection: Surgical  Other Topics Concern   Not on file  Social History Narrative   Right handed505   Social Drivers of Health   Tobacco Use: Low Risk (12/04/2024)   Patient History    Smoking Tobacco Use: Never    Smokeless Tobacco Use: Never    Passive Exposure: Not on file  Financial Resource Strain: Low Risk (12/03/2024)   Overall Financial Resource Strain (CARDIA)    Difficulty of Paying Living Expenses: Not very hard  Food Insecurity: No Food Insecurity (12/03/2024)   Epic    Worried About Programme Researcher, Broadcasting/film/video in the Last Year: Never true    Ran Out of Food in the Last Year: Never true  Transportation Needs: No Transportation Needs (12/03/2024)   Epic    Lack of Transportation (Medical): No    Lack of Transportation (Non-Medical): No  Physical Activity: Sufficiently Active (12/03/2024)   Exercise Vital Sign    Days of Exercise per Week: 4 days    Minutes of Exercise per Session: 50 min  Stress: Stress Concern Present (12/03/2024)   [employer] of Occupational Health - Occupational Stress Questionnaire    Feeling of Stress: To some extent  Social Connections: Socially Integrated (12/03/2024)   Social Connection and Isolation Panel    Frequency of Communication with Friends and Family: More than three times a week    Frequency of Social Gatherings with Friends and Family: Three times a week    Attends Religious Services: More than 4 times per year    Active Member of Clubs or Organizations: Yes    Attends Banker Meetings: More than 4 times per year     Marital Status: Married  Catering Manager Violence: Not on file  Depression (PHQ2-9): Low Risk (12/04/2024)   Depression (PHQ2-9)    PHQ-2 Score: 0  Alcohol Screen: Low Risk (12/03/2024)   Alcohol Screen    Last Alcohol Screening Score (AUDIT): 0  Housing: Low Risk (12/03/2024)   Epic    Unable to Pay for Housing in the Last Year: No    Number of Times Moved in the Last Year: 0    Homeless in the Last Year: No  Utilities: Not on file  Health Literacy: Not on file   Tobacco Use History[2] Social History   Substance and Sexual Activity  Alcohol Use Not Currently   Alcohol/week: 1.0 standard drink of alcohol   Types: 1 Standard drinks or equivalent per week   Comment: Drinks 3-4 times per year    Family History:  Family History  Problem Relation Age of Onset   Migraines Mother    Depression Mother    Anxiety disorder Mother    Hearing loss Mother    Heart attack Father 42   Heart failure Father    Diabetes Father    Hypertension Father    Heart disease Father    Kidney disease Father    Stroke Father    Aneurysm Father    Hyperlipidemia Father    Vision loss Father    Colon cancer Brother    Cancer Brother    Diabetes Paternal Grandfather    Sudden death Neg Hx    Breast cancer Neg Hx  Past medical history, surgical history, medications, allergies, family history and social history reviewed with patient today and changes made to appropriate areas of the chart.   Review of Systems  Constitutional:  Positive for diaphoresis. Negative for chills, fever, malaise/fatigue and weight loss.  HENT: Negative.    Eyes:  Positive for blurred vision. Negative for double vision, photophobia, pain, discharge and redness.  Respiratory: Negative.    Cardiovascular: Negative.   Gastrointestinal:  Positive for constipation (in July- none since) and heartburn (with the ozempic - using OTC meds). Negative for abdominal pain, blood in stool, diarrhea, melena, nausea and vomiting.   Genitourinary: Negative.   Musculoskeletal: Negative.   Skin: Negative.   Neurological:  Positive for headaches. Negative for dizziness, tingling, tremors, sensory change, speech change, focal weakness, seizures, loss of consciousness and weakness.  Endo/Heme/Allergies:  Positive for environmental allergies. Negative for polydipsia. Does not bruise/bleed easily.  Psychiatric/Behavioral:  Negative for depression, hallucinations, memory loss, substance abuse and suicidal ideas. The patient is nervous/anxious. The patient does not have insomnia.    All other ROS negative except what is listed above and in the HPI.      Objective:    BP 128/83   Pulse 76   Temp 98.3 F (36.8 C) (Oral)   Ht 5' (1.524 m)   Wt 148 lb (67.1 kg)   SpO2 97%   BMI 28.90 kg/m   Wt Readings from Last 3 Encounters:  12/04/24 148 lb (67.1 kg)  05/19/24 154 lb 14.4 oz (70.3 kg)  05/09/24 153 lb (69.4 kg)    Physical Exam Vitals and nursing note reviewed.  Constitutional:      General: She is not in acute distress.    Appearance: Normal appearance. She is not ill-appearing, toxic-appearing or diaphoretic.  HENT:     Head: Normocephalic and atraumatic.     Right Ear: Tympanic membrane, ear canal and external ear normal. There is no impacted cerumen.     Left Ear: Tympanic membrane, ear canal and external ear normal. There is no impacted cerumen.     Nose: Nose normal. No congestion or rhinorrhea.     Mouth/Throat:     Mouth: Mucous membranes are moist.     Pharynx: Oropharynx is clear. No oropharyngeal exudate or posterior oropharyngeal erythema.  Eyes:     General: No scleral icterus.       Right eye: No discharge.        Left eye: No discharge.     Extraocular Movements: Extraocular movements intact.     Conjunctiva/sclera: Conjunctivae normal.     Pupils: Pupils are equal, round, and reactive to light.  Neck:     Vascular: No carotid bruit.  Cardiovascular:     Rate and Rhythm: Normal rate and  regular rhythm.     Pulses: Normal pulses.     Heart sounds: No murmur heard.    No friction rub. No gallop.  Pulmonary:     Effort: Pulmonary effort is normal. No respiratory distress.     Breath sounds: Normal breath sounds. No stridor. No wheezing, rhonchi or rales.  Chest:     Chest wall: No tenderness.  Abdominal:     General: Abdomen is flat. Bowel sounds are normal. There is no distension.     Palpations: Abdomen is soft. There is no mass.     Tenderness: There is no abdominal tenderness. There is no right CVA tenderness, left CVA tenderness, guarding or rebound.     Hernia: No hernia  is present.  Genitourinary:    Comments: Breast and pelvic exams deferred with shared decision making Musculoskeletal:        General: No swelling, tenderness, deformity or signs of injury.     Cervical back: Normal range of motion and neck supple. No rigidity. No muscular tenderness.     Right lower leg: No edema.     Left lower leg: No edema.  Lymphadenopathy:     Cervical: No cervical adenopathy.  Skin:    General: Skin is warm and dry.     Capillary Refill: Capillary refill takes less than 2 seconds.     Coloration: Skin is not jaundiced or pale.     Findings: No bruising, erythema, lesion or rash.  Neurological:     General: No focal deficit present.     Mental Status: She is alert and oriented to person, place, and time. Mental status is at baseline.     Cranial Nerves: No cranial nerve deficit.     Sensory: No sensory deficit.     Motor: No weakness.     Coordination: Coordination normal.     Gait: Gait normal.     Deep Tendon Reflexes: Reflexes normal.  Psychiatric:        Mood and Affect: Mood normal.        Behavior: Behavior normal.        Thought Content: Thought content normal.        Judgment: Judgment normal.     Results for orders placed or performed in visit on 05/09/24  Bayer DCA Hb A1c Waived   Collection Time: 05/09/24  3:17 PM  Result Value Ref Range   HB A1C  (BAYER DCA - WAIVED) 5.5 4.8 - 5.6 %  CBC with Differential/Platelet   Collection Time: 05/09/24  3:18 PM  Result Value Ref Range   WBC 8.0 3.4 - 10.8 x10E3/uL   RBC 4.23 3.77 - 5.28 x10E6/uL   Hemoglobin 13.3 11.1 - 15.9 g/dL   Hematocrit 60.9 65.9 - 46.6 %   MCV 92 79 - 97 fL   MCH 31.4 26.6 - 33.0 pg   MCHC 34.1 31.5 - 35.7 g/dL   RDW 86.4 88.2 - 84.5 %   Platelets 182 150 - 450 x10E3/uL   Neutrophils 61 Not Estab. %   Lymphs 29 Not Estab. %   Monocytes 6 Not Estab. %   Eos 2 Not Estab. %   Basos 1 Not Estab. %   Neutrophils Absolute 4.9 1.4 - 7.0 x10E3/uL   Lymphocytes Absolute 2.4 0.7 - 3.1 x10E3/uL   Monocytes Absolute 0.5 0.1 - 0.9 x10E3/uL   EOS (ABSOLUTE) 0.2 0.0 - 0.4 x10E3/uL   Basophils Absolute 0.0 0.0 - 0.2 x10E3/uL   Immature Granulocytes 0 Not Estab. %   Immature Grans (Abs) 0.0 0.0 - 0.1 x10E3/uL  Lipid Panel w/o Chol/HDL Ratio   Collection Time: 05/09/24  3:18 PM  Result Value Ref Range   Cholesterol, Total 194 100 - 199 mg/dL   Triglycerides 417 (HH) 0 - 149 mg/dL   HDL 31 (L) >60 mg/dL   VLDL Cholesterol Cal 91 (H) 5 - 40 mg/dL   LDL Chol Calc (NIH) 72 0 - 99 mg/dL  Comprehensive metabolic panel with GFR   Collection Time: 05/09/24  3:18 PM  Result Value Ref Range   Glucose 116 (H) 70 - 99 mg/dL   BUN 11 6 - 24 mg/dL   Creatinine, Ser 9.59 (L) 0.57 - 1.00 mg/dL  eGFR 124 >59 mL/min/1.73   BUN/Creatinine Ratio 28 (H) 9 - 23   Sodium 137 134 - 144 mmol/L   Potassium 3.9 3.5 - 5.2 mmol/L   Chloride 100 96 - 106 mmol/L   CO2 19 (L) 20 - 29 mmol/L   Calcium  9.3 8.7 - 10.2 mg/dL   Total Protein 6.3 6.0 - 8.5 g/dL   Albumin 4.3 3.9 - 4.9 g/dL   Globulin, Total 2.0 1.5 - 4.5 g/dL   Bilirubin Total 1.1 0.0 - 1.2 mg/dL   Alkaline Phosphatase 112 44 - 121 IU/L   AST 17 0 - 40 IU/L   ALT 19 0 - 32 IU/L  VITAMIN D  25 Hydroxy (Vit-D Deficiency, Fractures)   Collection Time: 05/09/24  3:18 PM  Result Value Ref Range   Vit D, 25-Hydroxy 19.3 (L) 30.0 -  100.0 ng/mL      Assessment & Plan:   Problem List Items Addressed This Visit       Endocrine   Type 2 diabetes mellitus with hyperglycemia, without long-term current use of insulin  (HCC)   Doing great with A1c of 5.4. Discussed stopping metformin - would like to keep on current regimen for now. Recheck 6 months.       Relevant Medications   Semaglutide , 2 MG/DOSE, 8 MG/3ML SOPN   metFORMIN  (GLUCOPHAGE -XR) 500 MG 24 hr tablet   Other Relevant Orders   Microalbumin, Urine Waived   Bayer DCA Hb A1c Waived     Other   Hyperlipidemia   Under good control on current regimen. Continue current regimen. Continue to monitor. Call with any concerns. Refills given. Labs drawn today.        Vitamin D  deficiency disease   Rechecking labs today await results. Treat as needed.       Relevant Orders   VITAMIN D  25 Hydroxy (Vit-D Deficiency, Fractures)   Depression   Doing well not on medicine. Continue to monitor. Call with any concerns.       Other Visit Diagnoses       Routine general medical examination at a health care facility    -  Primary   Relevant Orders   CBC with Differential/Platelet   Comprehensive metabolic panel with GFR   Lipid Panel w/o Chol/HDL Ratio   TSH   Microalbumin, Urine Waived   Bayer DCA Hb A1c Waived   Hepatitis B surface antibody,quantitative     Encounter for screening mammogram for malignant neoplasm of breast       Relevant Orders   MM 3D SCREENING MAMMOGRAM BILATERAL BREAST        Follow up plan: Return in about 6 months (around 06/03/2025).   LABORATORY TESTING:  - Pap smear: up to date  IMMUNIZATIONS:   - Tdap: Tetanus vaccination status reviewed: last tetanus booster within 10 years. - Influenza: Up to date - Pneumovax: Up to date - Prevnar: Up to date - COVID: Refused - HPV: Up to date - Shingrix vaccine: Not applicable  SCREENING: -Mammogram: Ordered today  - Colonoscopy: Up to date    PATIENT COUNSELING:   Advised to  take 1 mg of folate supplement per day if capable of pregnancy.   Sexuality: Discussed sexually transmitted diseases, partner selection, use of condoms, avoidance of unintended pregnancy  and contraceptive alternatives.   Advised to avoid cigarette smoking.  I discussed with the patient that most people either abstain from alcohol or drink within safe limits (<=14/week and <=4 drinks/occasion for males, <=7/weeks and <= 3 drinks/occasion  for females) and that the risk for alcohol disorders and other health effects rises proportionally with the number of drinks per week and how often a drinker exceeds daily limits.  Discussed cessation/primary prevention of drug use and availability of treatment for abuse.   Diet: Encouraged to adjust caloric intake to maintain  or achieve ideal body weight, to reduce intake of dietary saturated fat and total fat, to limit sodium intake by avoiding high sodium foods and not adding table salt, and to maintain adequate dietary potassium and calcium  preferably from fresh fruits, vegetables, and low-fat dairy products.    stressed the importance of regular exercise  Injury prevention: Discussed safety belts, safety helmets, smoke detector, smoking near bedding or upholstery.   Dental health: Discussed importance of regular tooth brushing, flossing, and dental visits.    NEXT PREVENTATIVE PHYSICAL DUE IN 1 YEAR. Return in about 6 months (around 06/03/2025).              [1]  Allergies Allergen Reactions   Imitrex  [Sumatriptan ] Other (See Comments)    Chest pain    Wellbutrin  [Bupropion ] Other (See Comments)    Increased anxiety and tremors  [2]  Social History Tobacco Use  Smoking Status Never  Smokeless Tobacco Never   "

## 2024-12-05 ENCOUNTER — Other Ambulatory Visit: Payer: Self-pay

## 2024-12-05 ENCOUNTER — Encounter: Admitting: Family Medicine

## 2024-12-05 LAB — CBC WITH DIFFERENTIAL/PLATELET
Basophils Absolute: 0 x10E3/uL (ref 0.0–0.2)
Basos: 0 %
EOS (ABSOLUTE): 0 x10E3/uL (ref 0.0–0.4)
Eos: 0 %
Hematocrit: 38.7 % (ref 34.0–46.6)
Hemoglobin: 12.7 g/dL (ref 11.1–15.9)
Immature Grans (Abs): 0.2 x10E3/uL — ABNORMAL HIGH (ref 0.0–0.1)
Immature Granulocytes: 1 %
Lymphocytes Absolute: 1.7 x10E3/uL (ref 0.7–3.1)
Lymphs: 12 %
MCH: 30.7 pg (ref 26.6–33.0)
MCHC: 32.8 g/dL (ref 31.5–35.7)
MCV: 94 fL (ref 79–97)
Monocytes Absolute: 0.3 x10E3/uL (ref 0.1–0.9)
Monocytes: 2 %
Neutrophils Absolute: 11.9 x10E3/uL — ABNORMAL HIGH (ref 1.4–7.0)
Neutrophils: 85 %
Platelets: 200 x10E3/uL (ref 150–450)
RBC: 4.14 x10E6/uL (ref 3.77–5.28)
RDW: 12.4 % (ref 11.7–15.4)
WBC: 14.1 x10E3/uL — ABNORMAL HIGH (ref 3.4–10.8)

## 2024-12-05 LAB — COMPREHENSIVE METABOLIC PANEL WITH GFR
ALT: 13 IU/L (ref 0–32)
AST: 13 IU/L (ref 0–40)
Albumin: 4.5 g/dL (ref 3.9–4.9)
Alkaline Phosphatase: 102 IU/L (ref 41–116)
BUN/Creatinine Ratio: 17 (ref 9–23)
BUN: 12 mg/dL (ref 6–24)
Bilirubin Total: 0.7 mg/dL (ref 0.0–1.2)
CO2: 26 mmol/L (ref 20–29)
Calcium: 9.6 mg/dL (ref 8.7–10.2)
Chloride: 101 mmol/L (ref 96–106)
Creatinine, Ser: 0.69 mg/dL (ref 0.57–1.00)
Globulin, Total: 2.3 g/dL (ref 1.5–4.5)
Glucose: 144 mg/dL — ABNORMAL HIGH (ref 70–99)
Potassium: 4.8 mmol/L (ref 3.5–5.2)
Sodium: 140 mmol/L (ref 134–144)
Total Protein: 6.8 g/dL (ref 6.0–8.5)
eGFR: 108 mL/min/1.73

## 2024-12-05 LAB — BAYER DCA HB A1C WAIVED: HB A1C (BAYER DCA - WAIVED): 5.4 % (ref 4.8–5.6)

## 2024-12-05 LAB — LIPID PANEL W/O CHOL/HDL RATIO
Cholesterol, Total: 242 mg/dL — ABNORMAL HIGH (ref 100–199)
HDL: 42 mg/dL
LDL Chol Calc (NIH): 162 mg/dL — ABNORMAL HIGH (ref 0–99)
Triglycerides: 203 mg/dL — ABNORMAL HIGH (ref 0–149)
VLDL Cholesterol Cal: 38 mg/dL (ref 5–40)

## 2024-12-05 LAB — MICROALBUMIN, URINE WAIVED
Creatinine, Urine Waived: 50 mg/dL (ref 10–300)
Microalb, Ur Waived: 10 mg/L (ref 0–19)

## 2024-12-05 LAB — TSH: TSH: 0.356 u[IU]/mL — ABNORMAL LOW (ref 0.450–4.500)

## 2024-12-05 LAB — VITAMIN D 25 HYDROXY (VIT D DEFICIENCY, FRACTURES): Vit D, 25-Hydroxy: 28.1 ng/mL — ABNORMAL LOW (ref 30.0–100.0)

## 2024-12-05 LAB — HEPATITIS B SURFACE ANTIBODY, QUANTITATIVE: Hepatitis B Surf Ab Quant: 67.6 m[IU]/mL

## 2024-12-06 ENCOUNTER — Other Ambulatory Visit: Payer: Self-pay

## 2024-12-07 ENCOUNTER — Ambulatory Visit: Payer: Self-pay | Admitting: Family Medicine

## 2024-12-10 ENCOUNTER — Other Ambulatory Visit (HOSPITAL_COMMUNITY): Payer: Self-pay

## 2024-12-10 ENCOUNTER — Other Ambulatory Visit: Payer: Self-pay

## 2024-12-11 ENCOUNTER — Other Ambulatory Visit: Payer: Self-pay

## 2024-12-11 ENCOUNTER — Other Ambulatory Visit (HOSPITAL_COMMUNITY): Payer: Self-pay

## 2024-12-15 ENCOUNTER — Other Ambulatory Visit: Payer: Self-pay

## 2024-12-25 ENCOUNTER — Encounter: Payer: Self-pay | Admitting: Family Medicine

## 2024-12-25 NOTE — Telephone Encounter (Signed)
 Confirmed appt.

## 2024-12-26 ENCOUNTER — Encounter: Admitting: Family Medicine

## 2025-02-20 ENCOUNTER — Encounter

## 2025-05-08 ENCOUNTER — Ambulatory Visit: Admitting: Family Medicine
